# Patient Record
Sex: Male | Born: 1940 | Race: Black or African American | Hispanic: No | Marital: Married | Smoking: Former smoker
Health system: Southern US, Community
[De-identification: ages and names within clinical notes are randomized; demographics above are authoritative.]

## PROBLEM LIST (undated history)

## (undated) DIAGNOSIS — F810 Specific reading disorder: Secondary | ICD-10-CM

## (undated) DIAGNOSIS — J449 Chronic obstructive pulmonary disease, unspecified: Secondary | ICD-10-CM

## (undated) DIAGNOSIS — N400 Enlarged prostate without lower urinary tract symptoms: Secondary | ICD-10-CM

## (undated) DIAGNOSIS — N62 Hypertrophy of breast: Secondary | ICD-10-CM

## (undated) DIAGNOSIS — I10 Essential (primary) hypertension: Secondary | ICD-10-CM

## (undated) DIAGNOSIS — I639 Cerebral infarction, unspecified: Secondary | ICD-10-CM

## (undated) DIAGNOSIS — N529 Male erectile dysfunction, unspecified: Secondary | ICD-10-CM

## (undated) HISTORY — DX: Hypertrophy of breast: N62

## (undated) HISTORY — DX: Benign prostatic hyperplasia without lower urinary tract symptoms: N40.0

## (undated) HISTORY — DX: Male erectile dysfunction, unspecified: N52.9

## (undated) HISTORY — DX: Chronic obstructive pulmonary disease, unspecified: J44.9

## (undated) HISTORY — DX: Essential (primary) hypertension: I10

## (undated) HISTORY — PX: OTHER SURGICAL HISTORY: SHX169

## (undated) HISTORY — DX: Specific reading disorder: F81.0

---

## 2002-02-22 ENCOUNTER — Encounter: Admission: RE | Admit: 2002-02-22 | Discharge: 2002-02-22 | Payer: Self-pay | Admitting: Family Medicine

## 2002-02-22 ENCOUNTER — Encounter: Payer: Self-pay | Admitting: Family Medicine

## 2003-03-16 ENCOUNTER — Encounter: Admission: RE | Admit: 2003-03-16 | Discharge: 2003-03-16 | Payer: Self-pay | Admitting: Family Medicine

## 2003-03-16 ENCOUNTER — Encounter: Payer: Self-pay | Admitting: Family Medicine

## 2004-03-15 ENCOUNTER — Encounter: Admission: RE | Admit: 2004-03-15 | Discharge: 2004-03-15 | Payer: Self-pay | Admitting: Family Medicine

## 2005-04-08 ENCOUNTER — Ambulatory Visit: Payer: Self-pay | Admitting: Family Medicine

## 2005-04-08 ENCOUNTER — Encounter: Admission: RE | Admit: 2005-04-08 | Discharge: 2005-04-08 | Payer: Self-pay | Admitting: Family Medicine

## 2005-04-16 ENCOUNTER — Ambulatory Visit: Payer: Self-pay | Admitting: Gastroenterology

## 2005-04-24 ENCOUNTER — Ambulatory Visit: Payer: Self-pay | Admitting: Gastroenterology

## 2005-04-24 ENCOUNTER — Encounter (INDEPENDENT_AMBULATORY_CARE_PROVIDER_SITE_OTHER): Payer: Self-pay | Admitting: Specialist

## 2005-04-24 HISTORY — PX: COLONOSCOPY: SHX174

## 2006-05-08 ENCOUNTER — Encounter: Admission: RE | Admit: 2006-05-08 | Discharge: 2006-05-08 | Payer: Self-pay | Admitting: Family Medicine

## 2006-05-08 ENCOUNTER — Ambulatory Visit: Payer: Self-pay | Admitting: Family Medicine

## 2007-04-10 DIAGNOSIS — J449 Chronic obstructive pulmonary disease, unspecified: Secondary | ICD-10-CM

## 2007-04-10 DIAGNOSIS — I10 Essential (primary) hypertension: Secondary | ICD-10-CM | POA: Insufficient documentation

## 2007-04-10 DIAGNOSIS — R351 Nocturia: Secondary | ICD-10-CM

## 2007-04-10 DIAGNOSIS — N401 Enlarged prostate with lower urinary tract symptoms: Secondary | ICD-10-CM

## 2007-04-10 DIAGNOSIS — J4489 Other specified chronic obstructive pulmonary disease: Secondary | ICD-10-CM | POA: Insufficient documentation

## 2007-05-12 ENCOUNTER — Ambulatory Visit: Payer: Self-pay | Admitting: Family Medicine

## 2007-05-12 DIAGNOSIS — N529 Male erectile dysfunction, unspecified: Secondary | ICD-10-CM | POA: Insufficient documentation

## 2008-05-25 ENCOUNTER — Encounter: Payer: Self-pay | Admitting: Family Medicine

## 2008-05-26 ENCOUNTER — Ambulatory Visit: Payer: Self-pay | Admitting: Family Medicine

## 2008-05-26 LAB — CONVERTED CEMR LAB
Albumin: 3.8 g/dL (ref 3.5–5.2)
Basophils Absolute: 0.1 10*3/uL (ref 0.0–0.1)
Basophils Relative: 0.8 % (ref 0.0–3.0)
Bilirubin Urine: NEGATIVE
Blood in Urine, dipstick: NEGATIVE
Calcium: 9.7 mg/dL (ref 8.4–10.5)
Creatinine, Ser: 1.1 mg/dL (ref 0.4–1.5)
Direct LDL: 163.3 mg/dL
Eosinophils Absolute: 0.1 10*3/uL (ref 0.0–0.7)
GFR calc Af Amer: 86 mL/min
GFR calc non Af Amer: 71 mL/min
HCT: 40.1 % (ref 39.0–52.0)
HDL: 37.1 mg/dL — ABNORMAL LOW (ref >39.0)
Hemoglobin: 13.6 g/dL (ref 13.0–17.0)
Ketones, urine, test strip: NEGATIVE
MCHC: 33.9 g/dL (ref 30.0–36.0)
MCV: 85.5 fL (ref 78.0–100.0)
Monocytes Absolute: 0.5 10*3/uL (ref 0.1–1.0)
Neutro Abs: 4.4 10*3/uL (ref 1.4–7.7)
Neutrophils Relative %: 66 % (ref 43.0–77.0)
PSA: 1.13 ng/mL (ref 0.10–4.00)
RBC: 4.69 M/uL (ref 4.22–5.81)
Specific Gravity, Urine: 1.015
Total Bilirubin: 0.7 mg/dL (ref 0.3–1.2)
WBC Urine, dipstick: NEGATIVE
pH: 6.5

## 2008-06-02 ENCOUNTER — Ambulatory Visit: Payer: Self-pay | Admitting: Family Medicine

## 2008-08-23 ENCOUNTER — Telehealth: Payer: Self-pay | Admitting: Family Medicine

## 2009-05-26 ENCOUNTER — Encounter: Payer: Self-pay | Admitting: Family Medicine

## 2009-05-29 ENCOUNTER — Ambulatory Visit: Payer: Self-pay | Admitting: Family Medicine

## 2009-05-29 LAB — CONVERTED CEMR LAB
ALT: 14 units/L (ref 0–53)
Albumin: 3.8 g/dL (ref 3.5–5.2)
BUN: 12 mg/dL (ref 6–23)
Basophils Relative: 0.2 % (ref 0.0–3.0)
Bilirubin, Direct: 0 mg/dL (ref 0.0–0.3)
CO2: 31 meq/L (ref 19–32)
Chloride: 108 meq/L (ref 96–112)
Cholesterol: 166 mg/dL (ref 0–200)
Creatinine, Ser: 1 mg/dL (ref 0.4–1.5)
Eosinophils Absolute: 0.1 10*3/uL (ref 0.0–0.7)
Eosinophils Relative: 0.8 % (ref 0.0–5.0)
HCT: 41.7 % (ref 39.0–52.0)
Lymphs Abs: 1.5 10*3/uL (ref 0.7–4.0)
MCHC: 32.8 g/dL (ref 30.0–36.0)
MCV: 88.4 fL (ref 78.0–100.0)
Monocytes Absolute: 0.4 10*3/uL (ref 0.1–1.0)
Neutrophils Relative %: 69.2 % (ref 43.0–77.0)
PSA: 1.36 ng/mL (ref 0.10–4.00)
Potassium: 4.5 meq/L (ref 3.5–5.1)
RBC: 4.72 M/uL (ref 4.22–5.81)
TSH: 1.33 microintl units/mL (ref 0.35–5.50)
Total CHOL/HDL Ratio: 4
Total Protein: 7.4 g/dL (ref 6.0–8.3)
Triglycerides: 71 mg/dL (ref 0.0–149.0)

## 2010-03-29 ENCOUNTER — Encounter: Payer: Self-pay | Admitting: Gastroenterology

## 2010-04-02 ENCOUNTER — Encounter (INDEPENDENT_AMBULATORY_CARE_PROVIDER_SITE_OTHER): Payer: Self-pay | Admitting: *Deleted

## 2010-05-14 ENCOUNTER — Ambulatory Visit: Payer: Self-pay | Admitting: Gastroenterology

## 2010-05-22 ENCOUNTER — Ambulatory Visit: Payer: Self-pay | Admitting: Gastroenterology

## 2010-05-22 LAB — HM COLONOSCOPY

## 2010-05-24 ENCOUNTER — Encounter: Payer: Self-pay | Admitting: Gastroenterology

## 2010-06-01 ENCOUNTER — Encounter: Payer: Self-pay | Admitting: Family Medicine

## 2010-06-01 ENCOUNTER — Ambulatory Visit: Payer: Self-pay | Admitting: Family Medicine

## 2010-06-01 LAB — CONVERTED CEMR LAB
Blood in Urine, dipstick: NEGATIVE
Glucose, Urine, Semiquant: NEGATIVE
Nitrite: NEGATIVE
Specific Gravity, Urine: 1.015
WBC Urine, dipstick: NEGATIVE
pH: 7

## 2010-06-04 LAB — CONVERTED CEMR LAB
AST: 19 units/L (ref 0–37)
Albumin: 4 g/dL (ref 3.5–5.2)
Alkaline Phosphatase: 63 units/L (ref 39–117)
Basophils Relative: 0.4 % (ref 0.0–3.0)
CO2: 29 meq/L (ref 19–32)
Calcium: 9.7 mg/dL (ref 8.4–10.5)
Chloride: 105 meq/L (ref 96–112)
Eosinophils Absolute: 0.1 10*3/uL (ref 0.0–0.7)
Glucose, Bld: 93 mg/dL (ref 70–99)
HCT: 41.7 % (ref 39.0–52.0)
HDL: 49.5 mg/dL (ref >39.00)
Hemoglobin: 13.7 g/dL (ref 13.0–17.0)
Lymphocytes Relative: 22.3 % (ref 12.0–46.0)
Lymphs Abs: 1.7 10*3/uL (ref 0.7–4.0)
MCHC: 33 g/dL (ref 30.0–36.0)
Monocytes Relative: 6.6 % (ref 3.0–12.0)
Neutro Abs: 5.5 10*3/uL (ref 1.4–7.7)
Potassium: 5.7 meq/L — ABNORMAL HIGH (ref 3.5–5.1)
RBC: 4.79 M/uL (ref 4.22–5.81)
RDW: 14.3 % (ref 11.5–14.6)
Sodium: 141 meq/L (ref 135–145)
TSH: 0.85 microintl units/mL (ref 0.35–5.50)
Total CHOL/HDL Ratio: 3
Total Protein: 7.3 g/dL (ref 6.0–8.3)

## 2010-09-30 LAB — CONVERTED CEMR LAB
AST: 20 units/L (ref 0–37)
Albumin: 3.6 g/dL (ref 3.5–5.2)
Basophils Absolute: 0.1 10*3/uL (ref 0.0–0.1)
Basophils Relative: 0.8 % (ref 0.0–1.0)
CO2: 28 meq/L (ref 19–32)
Chloride: 106 meq/L (ref 96–112)
Creatinine, Ser: 1.1 mg/dL (ref 0.4–1.5)
Direct LDL: 148.6 mg/dL
Eosinophils Relative: 0.6 % (ref 0.0–5.0)
HCT: 40.9 % (ref 39.0–52.0)
Hemoglobin: 13.9 g/dL (ref 13.0–17.0)
Monocytes Absolute: 0.5 10*3/uL (ref 0.2–0.7)
Neutrophils Relative %: 66.7 % (ref 43.0–77.0)
PSA: 1.33 ng/mL (ref 0.10–4.00)
Potassium: 4.5 meq/L (ref 3.5–5.1)
RBC: 4.84 M/uL (ref 4.22–5.81)
RDW: 13.3 % (ref 11.5–14.6)
Sodium: 140 meq/L (ref 135–145)
Total Bilirubin: 0.8 mg/dL (ref 0.3–1.2)
Total CHOL/HDL Ratio: 5.6
Total Protein: 6.9 g/dL (ref 6.0–8.3)
Triglycerides: 118 mg/dL (ref 0–149)
VLDL: 24 mg/dL (ref 0–40)
WBC: 7.1 10*3/uL (ref 4.5–10.5)

## 2010-10-04 NOTE — Procedures (Signed)
Summary: Colonoscopy  Patient: Eric [name] Note: All result statuses are Final unless otherwise noted.  Tests: (1) Colonoscopy (COL)   COL Colonoscopy           DONE     [facility]     [address]     [place], [place]  [address]           COLONOSCOPY PROCEDURE REPORT           PATIENT:  [name], [name]  MR#:  [id number]     BIRTHDATE:  [birth year], 69 yrs. old  GENDER:  male     ENDOSCOPIST:  Rachael Fee, MD     PROCEDURE DATE:  05/22/2010     PROCEDURE:  Colonoscopy with biopsy     ASA CLASS:  Class II     INDICATIONS:  history of pre-cancerous (adenomatous) colon polyps,     subcentimeter TA removed in 2006     MEDICATIONS:   Fentanyl 50 mcg IV, Versed 5 mg IV           DESCRIPTION OF PROCEDURE:   After the risks benefits and     alternatives of the procedure were thoroughly explained, informed     consent was obtained.  Digital rectal exam was performed and     revealed no rectal masses.   The LB [id number]-[id number] endoscope     was introduced through the anus and advanced to the cecum, which     was identified by both the appendix and ileocecal valve, without     limitations.  The quality of the prep was adequate, using     MoviPrep.  The instrument was then slowly withdrawn as the colon     was fully examined.     <<PROCEDUREIMAGES>>     FINDINGS:  There were two 1-83mm sessile polyps in descending     colon. Both were removed with forceps and sent to pathology (jar     1) (see image4).  Mild diverticulosis was found throughout the     colon.  This was otherwise a normal examination of the colon (see     image1, image2, and image5).   Retroflexed views in the rectum     revealed no abnormalities.    The scope was then withdrawn from     the patient and the procedure completed.     COMPLICATIONS:  None           ENDOSCOPIC IMPRESSION:     1) Two small polyps, both removed and sent to pathology     2) Mild diverticulosis throughout the colon     3)  Otherwise normal examination           RECOMMENDATIONS:     1) Given your personal history of adenomatous (pre-cancerous)     polyps, you will need a repeat colonoscopy in 5 years even if the     polyps removed today are not pre-cancerous.     2) You will receive a letter within 1-2 weeks with the results     of your biopsy as well as final recommendations. Please call my     office if you have not received a letter after 3 weeks.           ______________________________     Rachael Fee, MD           n.     eSIGNED:   Rachael Fee at 05/22/2010 11:13 AM  Sherri, [place], [phone]  Note: An exclamation mark (!) indicates a result that was not dispersed into the flowsheet. Document Creation Date: 05/22/2010 11:14 AM _______________________________________________________________________  (1) Order result status: Final Collection or observation date-time: 05/22/2010 11:08 Requested date-time:  Receipt date-time:  Reported date-time:  Referring Physician:   Ordering Physician: Rob Bunting [phone]) Specimen Source:  Source: Launa Grill Order Number: [id number] Lab site:   Appended Document: Colonoscopy     Procedures Next Due Date:    Colonoscopy: 05/2015

## 2010-10-04 NOTE — Letter (Signed)
Summary: Results Letter  [facility]  [address] [place], [place] [address]   Phone: [phone]  Fax: [phone]        May 24, 2010 MRN: [id number]    [facility] [address] [place], [place]  [address]    Dear Mr. GENEST,   At least one of the polyps removed during your recent procedure was proven to be adenomatous.  These are pre-cancerous polyps that may have grown into cancers if they had not been removed.  Based on current nationally recognized surveillance guidelines, I recommend that you have a repeat colonoscopy in 5 years.  We will therefore put your information in our reminder system and will contact you in 5 years to schedule a repeat procedure.  Please call if you have any questions or concerns.       Sincerely,  Rachael Fee MD  This letter has been electronically signed by your physician.  Appended Document: Results Letter letter mailed

## 2010-10-04 NOTE — Miscellaneous (Signed)
Summary: previsit prep/RM  Clinical Lists Changes  Medications: Added new medication of HALFLYTELY WITH FLAVOR PACKS 5-210 MG-GM KIT (BISACODYL-PEG-KCL-NABICAR-NACL) take as directed for colon prep - Signed Rx of HALFLYTELY WITH FLAVOR PACKS 5-210 MG-GM KIT (BISACODYL-PEG-KCL-NABICAR-NACL) take as directed for colon prep;  #1 x 0;  Signed;  Entered by: Sherren Kerns RN;  Authorized by: Rachael Fee MD;  Method used: [facility]  [address] [id number] [address] [place], [place]  [address], Ph: [phone] or [phone], Fax: [phone] Observations: Added new observation of ALLERGY REV: Done (05/14/2010 10:16)    Prescriptions: HALFLYTELY WITH FLAVOR PACKS 5-210 MG-GM KIT (BISACODYL-PEG-KCL-NABICAR-NACL) take as directed for colon prep  #1 x 0   Entered by:   Sherren Kerns RN   Authorized by:   Rachael Fee MD   Signed by:   Sherren Kerns RN on 05/14/2010   Method used:   [facility]  [address] [id number]       [address]       [place], [place]  [address]       Ph: [phone] or [phone]       Fax: [phone]   RxID:   [id number]   Appended Document: previsit prep/RM Patient request halflytely prep due to cost,he took this last time. Called pharmacy he uses but they could not give me price of movi prep until patient fills RX. Gave halflytely prep this time also.Sherren Kerns RN  May 14, 2010 10:57 AM'    Clinical Lists Changes

## 2010-10-04 NOTE — Assessment & Plan Note (Signed)
Summary: pt will come in fasting/njr   Vital Signs:  Patient profile:   70 year old male Height:      68.25 inches Weight:      164 pounds BMI:     24.84 Temp:     97.6 degrees F oral BP sitting:   130 / 90  (left arm) Cuff size:   regular  Vitals Entered By: Kern Reap CMA Duncan Dull) (June 01, 2010 10:32 AM) CC: annual wellness exam Is Patient Diabetic? No Pain Assessment Patient in pain? no        CC:  annual wellness exam.  History of Present Illness: Eric [name] is a delightful, 70 year old, married male, nonsmoker, x 4 years, who comes in today for Medicare wellness exam.  He has underlying hypertension, in which he treats with Accupril 20 mg daily BP 130/90.  Also one aspirin tablet daily.  Review of systems negative except for erectile dysfunction, but does not respond to oral medications.  Tetanus 2009, Pneumovax 2007, seasonal flu shot today.  Routine eye care, dental care, colonoscopy recently, showed a polyp 5 year recall Here for Medicare AWV:  1.   Risk factors based on Past M, S, F history:.....reviewed in detail the changes 2.   Physical Activities: walks daily 3.   Depression/mood: good mood.  No depression 4.   Hearing: hearing normal 5.   ADL's: functions independently manages his own finances 6.   Fall Risk: reviewed.  No risk factors  identified 7.   Home Safety:......Marland Kitchenreviewed no guns in the house 8.   Height, weight, &visual acuity:height weight, normal annual eye exam 9.   Counseling: continue exercise and medication 10.   Labs ordered based on risk factors: labs done today 11.           Referral Coordination.........none indicated 12.           Care Plan.........Marland Kitchenreviewed medication, diet, exercise, and living will 13.            Cognitive Assessment ........Marland Kitchenoriented x 3 ...... does his own financial work  Allergies (verified): No Known Drug Allergies  Past History:  Past medical, surgical, family and social histories (including risk factors)  reviewed, and no changes noted (except as noted below).  Past Medical History: Reviewed history from 05/26/2008 and no changes required. COPD Hypertension Benign prostatic hypertrophy ED gynecomastia bilateral breast removal  Past Surgical History: Reviewed history from 04/10/2007 and no changes required. Bilat. Breast removal Colonoscopy-04/24/2005  Family History: Reviewed history from 04/10/2007 and no changes required. Family History Diabetes 1st degree relative Family History Hypertension Fam hx PVD  Social History: Reviewed history from 05/26/2008 and no changes required. Former Smoker Alcohol use-yes Regular exercise-yes Retired  Review of Systems      See HPI       Flu Vaccine Consent Questions     Do you have a history of severe allergic reactions to this vaccine? no    Any prior history of allergic reactions to egg and/or gelatin? no    Do you have a sensitivity to the preservative Thimersol? no    Do you have a past history of Guillan-Barre Syndrome? no    Do you currently have an acute febrile illness? no    Have you ever had a severe reaction to latex? no    Vaccine information given and explained to patient? yes    Are you currently pregnant? no    Lot Number:[id number]   Exp Date:03/02/2011   Site Given  Left Deltoid  IM   Physical Exam  General:  Well-developed,well-nourished,in no acute distress; alert,appropriate and cooperative throughout examination Head:  Normocephalic and atraumatic without obvious abnormalities. No apparent alopecia or balding. Eyes:  No corneal or conjunctival inflammation noted. EOMI. Perrla. Funduscopic exam benign, without hemorrhages, exudates or papilledema. Vision grossly normal. Ears:  External ear exam shows no significant lesions or deformities.  Otoscopic examination reveals clear canals, tympanic membranes are intact bilaterally without bulging, retraction, inflammation or discharge. Hearing is grossly normal  bilaterally. Nose:  External nasal examination shows no deformity or inflammation. Nasal mucosa are pink and moist without lesions or exudates. Mouth:  Oral mucosa and oropharynx without lesions or exudates.  Teeth in good repair. Neck:  No deformities, masses, or tenderness noted. Chest Wall:  No deformities, masses, tenderness or gynecomastia noted. Breasts:  No masses or gynecomastia noted Lungs:  Normal respiratory effort, chest expands symmetrically. Lungs are clear to auscultation, no crackles or wheezes. Heart:  Normal rate and regular rhythm. S1 and S2 normal without gallop, murmur, click, rub or other extra sounds. Abdomen:  Bowel sounds positive,abdomen soft and non-tender without masses, organomegaly or hernias noted. Rectal:  No external abnormalities noted. Normal sphincter tone. No rectal masses or tenderness. Genitalia:  Testes bilaterally descended without nodularity, tenderness or masses. No scrotal masses or lesions. No penis lesions or urethral discharge. Prostate:  Prostate gland firm and smooth, no enlargement, nodularity, tenderness, mass, asymmetry or induration. Msk:  No deformity or scoliosis noted of thoracic or lumbar spine.   Pulses:  R and L carotid,radial,femoral,dorsalis pedis and posterior tibial pulses are full and equal bilaterally Extremities:  No clubbing, cyanosis, edema, or deformity noted with normal full range of motion of all joints.   Neurologic:  No cranial nerve deficits noted. Station and gait are normal. Plantar reflexes are down-going bilaterally. DTRs are symmetrical throughout. Sensory, motor and coordinative functions appear intact. Skin:  Intact without suspicious lesions or rashes.........scars on right and left chest wall from previous surgery Cervical Nodes:  No lymphadenopathy noted Axillary Nodes:  No palpable lymphadenopathy Inguinal Nodes:  No significant adenopathy Psych:  Cognition and judgment appear intact. Alert and cooperative with  normal attention span and concentration. No apparent delusions, illusions, hallucinations   Impression & Recommendations:  Problem # 1:  ERECTILE DYSFUNCTION, ORGANIC (ICD-607.84) Assessment Deteriorated  The following medications were removed from the medication list:    Levitra 10 Mg Tabs (Vardenafil hcl) ..... Uad  Problem # 2:  HYPERTENSION (ICD-401.9) Assessment: Improved  His updated medication list for this problem includes:    Accupril 20 Mg Tabs (Quinapril hcl) .Marland Kitchen... Take 1 tablet by mouth every morning  Orders: Venipuncture (43154) TLB-Lipid Panel (80061-LIPID) TLB-BMP (Basic Metabolic Panel-BMET) (80048-METABOL) TLB-CBC Platelet - w/Differential (85025-CBCD) TLB-Hepatic/Liver Function Pnl (80076-HEPATIC) TLB-TSH (Thyroid Stimulating Hormone) (84443-TSH) TLB-PSA (Prostate Specific Antigen) (84153-PSA) Medicare -1st Annual Wellness Visit [phone]) Urinalysis-dipstick only (Medicare patient) (61950DT) Specimen Handling (26712) EKG w/ Interpretation (93000)  Problem # 3:  Preventive Health Care (ICD-V70.0) Assessment: Unchanged  Complete Medication List: 1)  Accupril 20 Mg Tabs (Quinapril hcl) .... Take 1 tablet by mouth every morning  Other Orders: Flu Vaccine 51yrs + MEDICARE PATIENTS (W5809) Administration Flu vaccine - MCR (X8338)  Patient Instructions: 1)  Please schedule a follow-up appointment in 1 year. 2)  It is important that you exercise regularly at least 20 minutes 5 times a week. If you develop chest pain, have severe difficulty breathing, or feel very tired , stop exercising immediately and seek medical attention.  3)  Schedule a colonoscopy/sigmoidoscopy to help detect colon cancer. 4)  Take an Aspirin every day. 5)  Choose your Health care Power of Attorney and/or prepare a Living Will. Prescriptions: ACCUPRIL 20 MG TABS (QUINAPRIL HCL) Take 1 tablet by mouth every morning  #100 Tablet x 3   Entered and Authorized by:   Roderick Pee MD   Signed  by:   Roderick Pee MD on 06/01/2010   Method used:   [facility]  [address] [id number]       [address]       [place], [place]  [address]       Ph: [phone] or [phone]       Fax: [phone]   RxID:   [id number]     Laboratory Results   Urine Tests  Date/Time Recieved: June 01, 2010 12:49 PM  Date/Time Reported: June 01, 2010 12:49 PM   Routine Urinalysis   Color: yellow Appearance: Clear Glucose: negative   (Normal Range: Negative) Bilirubin: negative   (Normal Range: Negative) Ketone: negative   (Normal Range: Negative) Spec. Gravity: 1.015   (Normal Range: 1.003-1.035) Blood: negative   (Normal Range: Negative) pH: 7.0   (Normal Range: 5.0-8.0) Protein: negative   (Normal Range: Negative) Urobilinogen: 0.2   (Normal Range: 0-1) Nitrite: negative   (Normal Range: Negative) Leukocyte Esterace: negative   (Normal Range: Negative)    Comments: Wynona Canes, CMA  June 01, 2010 12:49 PM

## 2010-10-04 NOTE — Letter (Signed)
Summary: Colonoscopy Letter  [facility]  [address] [place], [place] [address]   Phone: [phone]  Fax: [phone]      March 29, 2010 MRN: [id number]   [facility] [address] [place], [place]  [address]   Dear Mr. BEECK,   According to your medical record, it is time for you to schedule a Colonoscopy. The American Cancer Society recommends this procedure as a method to detect early colon cancer. Patients with a family history of colon cancer, or a personal history of colon polyps or inflammatory bowel disease are at increased risk.  This letter has been generated based on the recommendations made at the time of your procedure. If you feel that in your particular situation this may no longer apply, please contact our office.  Please call our office at [phone] to schedule this appointment or to update your records at your earliest convenience.  Thank you for cooperating with [place] to provide you with the very best care possible.   Sincerely,  Rachael Fee, M.D.  [facility] Gastroenterology Division [phone]

## 2010-10-04 NOTE — Letter (Signed)
Summary: Previsit letter  [facility]  [address] [place], [place] [address]   Phone: [phone]  Fax: [phone]       04/02/2010 MRN: [id number]  [facility] [address] [place], [place]  [address]  Dear Eric [name],  Welcome to the Gastroenterology Division at [facility].    You are scheduled to see a nurse for your pre-procedure visit on 05/14/2010 at 10:30AM on the 3rd floor at [facility], [address]  We ask that you try to arrive at our office 15 minutes prior to your appointment time to allow for check-in.  Your nurse visit will consist of discussing your medical and surgical history, your immediate family medical history, and your medications.    Please bring a complete list of all your medications or, if you prefer, bring the medication bottles and we will list them.  We will need to be aware of both prescribed and over the counter drugs.  We will need to know exact dosage information as well.  If you are on blood thinners (Coumadin, Plavix, Aggrenox, Ticlid, etc.) please call our office today/prior to your appointment, as we need to consult with your physician about holding your medication.   Please be prepared to read and sign documents such as consent forms, a financial agreement, and acknowledgement forms.  If necessary, and with your consent, a friend or relative is welcome to sit-in on the nurse visit with you.  Please bring your insurance card so that we may make a copy of it.  If your insurance requires a referral to see a specialist, please bring your referral form from your primary care physician.  No co-pay is required for this nurse visit.     If you cannot keep your appointment, please call [phone] to cancel or reschedule prior to your appointment date.  This allows [place] the opportunity to schedule an appointment for another patient in need of care.    Thank you for choosing [facility] for your medical  needs.  We appreciate the opportunity to care for you.  Please visit [place] at our website  to learn more about our practice.                     Sincerely.                                                                                                                   The Gastroenterology Division

## 2011-06-03 ENCOUNTER — Ambulatory Visit (INDEPENDENT_AMBULATORY_CARE_PROVIDER_SITE_OTHER): Payer: Self-pay | Admitting: Family Medicine

## 2011-06-03 ENCOUNTER — Encounter: Payer: Self-pay | Admitting: Family Medicine

## 2011-06-03 VITALS — BP 170/90 | Temp 97.5°F | Ht 67.75 in | Wt 164.0 lb

## 2011-06-03 DIAGNOSIS — Z0000 Encounter for general adult medical examination without abnormal findings: Secondary | ICD-10-CM

## 2011-06-03 DIAGNOSIS — I10 Essential (primary) hypertension: Secondary | ICD-10-CM

## 2011-06-03 DIAGNOSIS — Z23 Encounter for immunization: Secondary | ICD-10-CM

## 2011-06-03 DIAGNOSIS — J449 Chronic obstructive pulmonary disease, unspecified: Secondary | ICD-10-CM

## 2011-06-03 DIAGNOSIS — N400 Enlarged prostate without lower urinary tract symptoms: Secondary | ICD-10-CM

## 2011-06-03 LAB — HEPATIC FUNCTION PANEL
ALT: 16 U/L (ref 0–53)
AST: 23 U/L (ref 0–37)
Alkaline Phosphatase: 56 U/L (ref 39–117)
Bilirubin, Direct: 0.1 mg/dL (ref 0.0–0.3)
Total Protein: 7.7 g/dL (ref 6.0–8.3)

## 2011-06-03 LAB — POCT URINALYSIS DIPSTICK
Blood, UA: NEGATIVE
Nitrite, UA: NEGATIVE
Protein, UA: NEGATIVE
Spec Grav, UA: 1.015
Urobilinogen, UA: 0.2
pH, UA: 8.5

## 2011-06-03 LAB — BASIC METABOLIC PANEL
Chloride: 106 mEq/L (ref 96–112)
Creatinine, Ser: 0.9 mg/dL (ref 0.4–1.5)

## 2011-06-03 LAB — CBC WITH DIFFERENTIAL/PLATELET
Basophils Relative: 0.4 % (ref 0.0–3.0)
Eosinophils Relative: 1.2 % (ref 0.0–5.0)
Lymphocytes Relative: 21.6 % (ref 12.0–46.0)
MCV: 78 fl (ref 78.0–100.0)
Monocytes Relative: 6.2 % (ref 3.0–12.0)
Neutrophils Relative %: 70.6 % (ref 43.0–77.0)
RBC: 4.95 Mil/uL (ref 4.22–5.81)
WBC: 7.3 10*3/uL (ref 4.5–10.5)

## 2011-06-03 LAB — TSH: TSH: 0.95 u[IU]/mL (ref 0.35–5.50)

## 2011-06-03 LAB — PSA: PSA: 1.66 ng/mL (ref 0.10–4.00)

## 2011-06-03 LAB — LIPID PANEL: Total CHOL/HDL Ratio: 3

## 2011-06-03 MED ORDER — QUINAPRIL HCL 20 MG PO TABS: 20.0000 mg | ORAL_TABLET | Freq: Every day | ORAL | Status: DC

## 2011-06-03 NOTE — Progress Notes (Signed)
Subjective:    Patient ID: Eric [name], male    DOB: 1941-08-27, 70 y.o.   MRN: [id number]  HPI Wil;lie Is a 70 year old male, who comes in today for a Medicare wellness exam.  Because of an underlying history of hypertension.  He is always been in good health.  He no longer smokes.  He has underlying COPD.  He takes Accupril 20 mg daily BP today 170/90.  He, states he's not taking his medication on a daily basis.  Advised to take a daily to her blood pressure checked at home daily for 3 weeks.  Return in 3 weeks for follow-up.  He gets routine eye care, hearing normal, regular dental care, activities of daily living, normal, although safety reviewed.  No lesions identified.  Cognitive function, normal, no guns in the house, he does have a healthcare power of attorney and living will.  He walks on a daily basis.  Tetanus booster 2009 Pneumovax 2007 seasonal flu shot today.   Review of Systems  Constitutional: Negative.   HENT: Negative.   Eyes: Negative.   Respiratory: Negative.   Cardiovascular: Negative.   Gastrointestinal: Negative.   Genitourinary: Negative.   Musculoskeletal: Negative.   Skin: Negative.   Neurological: Negative.   Hematological: Negative.   Psychiatric/Behavioral: Negative.        Objective:   Physical Exam  Constitutional: He is oriented to person, place, and time. He appears well-developed and well-nourished.  HENT:  Head: Normocephalic and atraumatic.  Right Ear: External ear normal.  Left Ear: External ear normal.  Nose: Nose normal.  Mouth/Throat: Oropharynx is clear and moist.  Eyes: Conjunctivae and EOM are normal. Pupils are equal, round, and reactive to light.  Neck: Normal range of motion. Neck supple. No JVD present. No tracheal deviation present. No thyromegaly present.  Cardiovascular: Normal rate, regular rhythm, normal heart sounds and intact distal pulses.  Exam reveals no gallop and no friction rub.   No murmur  heard. Pulmonary/Chest: Effort normal and breath sounds normal. No stridor. No respiratory distress. He has no wheezes. He has no rales. He exhibits no tenderness.  Abdominal: Soft. Bowel sounds are normal. He exhibits no distension and no mass. There is no tenderness. There is no rebound and no guarding.  Genitourinary: Rectum normal, prostate normal and penis normal. Guaiac negative stool. No penile tenderness.  Musculoskeletal: Normal range of motion. He exhibits no edema and no tenderness.  Lymphadenopathy:    He has no cervical adenopathy.  Neurological: He is alert and oriented to person, place, and time. He has normal reflexes. No cranial nerve deficit. He exhibits normal muscle tone.  Skin: Skin is warm and dry. No rash noted. No erythema. No pallor.  Psychiatric: He has a normal mood and affect. His behavior is normal. Judgment and thought content normal.          Assessment & Plan:  Healthy male.  Hypertension, not at goal.  Recommend daily medication.  BP check.  Daily follow-up in two weeks

## 2011-06-03 NOTE — Patient Instructions (Signed)
Take your blood pressure medication, one tab daily in the morning.  Purchase.  A pump up digital blood pressure cuff and check your blood pressure daily in the morning.  Return in two weeks with the data and the device

## 2011-06-17 ENCOUNTER — Ambulatory Visit (INDEPENDENT_AMBULATORY_CARE_PROVIDER_SITE_OTHER): Payer: Medicare Other | Admitting: Family Medicine

## 2011-06-17 ENCOUNTER — Encounter: Payer: Self-pay | Admitting: Family Medicine

## 2011-06-17 DIAGNOSIS — M25519 Pain in unspecified shoulder: Secondary | ICD-10-CM

## 2011-06-17 DIAGNOSIS — I10 Essential (primary) hypertension: Secondary | ICD-10-CM

## 2011-06-17 NOTE — Progress Notes (Signed)
Subjective:    Patient ID: Eric [name], male    DOB: 1941/01/19, 70 y.o.   MRN: [id number]  HPIWill is a 70 year old male, who comes in today for follow-up of hypertension.  He had stopped his blood pressure medication.  BP gone up to 180/120.  We started him on Accupril 20 mg daily BP now 142/88.  No side effect from medication.  He is also complaining of bilateral shoulder pain.  He states the Motrin, 600 mg b.i.d. With no improvement in his pain.  He does not recall any specific trauma.    Review of Systems General cardiovascular orthopedic review of systems otherwise negative    Objective:   Physical Exam  Well-developed well-nourished, male in no acute distress.  BP right arm sitting position 142/88      Assessment & Plan:  Hypertension under improved control continue Accupril 20 daily BP check.  Q.a.m. Follow-up in 3 months.  Bilateral shoulder pain, etiology unknown.  Refer to Dr. Cleophas Dunker for consult

## 2011-06-17 NOTE — Patient Instructions (Signed)
Continue the Accupril, one tablet daily in the morning and continue checking her blood pressure daily.  Follow-up in 3 months.  Call Dr. Norlene Campbell orthopedist for consult about your arm pain

## 2011-09-17 ENCOUNTER — Ambulatory Visit (INDEPENDENT_AMBULATORY_CARE_PROVIDER_SITE_OTHER): Payer: Medicare Other | Admitting: Family Medicine

## 2011-09-17 ENCOUNTER — Encounter: Payer: Self-pay | Admitting: Family Medicine

## 2011-09-17 VITALS — BP 120/80 | Temp 98.1°F | Wt 170.0 lb

## 2011-09-17 DIAGNOSIS — I10 Essential (primary) hypertension: Secondary | ICD-10-CM

## 2011-09-17 MED ORDER — QUINAPRIL HCL 20 MG PO TABS: 20.0000 mg | ORAL_TABLET | ORAL | Status: DC

## 2011-09-17 NOTE — Progress Notes (Signed)
Subjective:    Patient ID: Eric [name], male    DOB: Aug 24, 1941, 71 y.o.   MRN: [id number]  HPI Eric [name] is a 71 year old male with underlying hypertension, who comes in today for follow-up.  We previously had seen him in his blood pressure was elevated.  However, he was consuming a lot of salt and not taking his medication on a daily basis.  He went on a salt free diet begin to take his 20 mg of Accupril daily, and now blood pressure is 120/80.   Review of Systems General and cardiovascular review of systems otherwise negative    Objective:   Physical Exam  Well-developed well-nourished, male in no acute distress.  BP right arm sitting position 120/80.  Pulse 70 and regular      Assessment & Plan:  Hypertension at goal continue therapy.  Follow up in the fall of 2013 per annual Medicare wellness exam

## 2011-09-17 NOTE — Patient Instructions (Signed)
Continue to take your medication daily, and avoid salt.  Follow-up in the fall of 2013 for your annual Medicare wellness exam

## 2012-06-04 ENCOUNTER — Ambulatory Visit (INDEPENDENT_AMBULATORY_CARE_PROVIDER_SITE_OTHER): Payer: Medicare Other | Admitting: Family Medicine

## 2012-06-04 ENCOUNTER — Encounter: Payer: Self-pay | Admitting: Family Medicine

## 2012-06-04 VITALS — BP 140/90 | Temp 98.2°F | Ht 69.5 in | Wt 169.0 lb

## 2012-06-04 DIAGNOSIS — N400 Enlarged prostate without lower urinary tract symptoms: Secondary | ICD-10-CM

## 2012-06-04 DIAGNOSIS — I10 Essential (primary) hypertension: Secondary | ICD-10-CM

## 2012-06-04 DIAGNOSIS — J4489 Other specified chronic obstructive pulmonary disease: Secondary | ICD-10-CM

## 2012-06-04 DIAGNOSIS — Z0000 Encounter for general adult medical examination without abnormal findings: Secondary | ICD-10-CM

## 2012-06-04 DIAGNOSIS — Z23 Encounter for immunization: Secondary | ICD-10-CM

## 2012-06-04 DIAGNOSIS — J449 Chronic obstructive pulmonary disease, unspecified: Secondary | ICD-10-CM

## 2012-06-04 DIAGNOSIS — N529 Male erectile dysfunction, unspecified: Secondary | ICD-10-CM

## 2012-06-04 LAB — POCT URINALYSIS DIPSTICK
Glucose, UA: NEGATIVE
Leukocytes, UA: NEGATIVE
Nitrite, UA: NEGATIVE
Protein, UA: NEGATIVE
Urobilinogen, UA: 0.2

## 2012-06-04 LAB — CBC WITH DIFFERENTIAL/PLATELET
Eosinophils Absolute: 0.1 10*3/uL (ref 0.0–0.7)
Eosinophils Relative: 0.6 % (ref 0.0–5.0)
HCT: 38.2 % — ABNORMAL LOW (ref 39.0–52.0)
Lymphs Abs: 1.5 10*3/uL (ref 0.7–4.0)
MCHC: 31.8 g/dL (ref 30.0–36.0)
MCV: 79.9 fl (ref 78.0–100.0)
Monocytes Absolute: 0.6 10*3/uL (ref 0.1–1.0)
Neutrophils Relative %: 75.3 % (ref 43.0–77.0)
Platelets: 337 10*3/uL (ref 150.0–400.0)
WBC: 8.5 10*3/uL (ref 4.5–10.5)

## 2012-06-04 LAB — TSH: TSH: 1.06 u[IU]/mL (ref 0.35–5.50)

## 2012-06-04 LAB — BASIC METABOLIC PANEL
Calcium: 9.4 mg/dL (ref 8.4–10.5)
Creatinine, Ser: 1.1 mg/dL (ref 0.4–1.5)
GFR: 88.44 mL/min (ref >60.00)
Sodium: 139 mEq/L (ref 135–145)

## 2012-06-04 MED ORDER — QUINAPRIL HCL 20 MG PO TABS: 20.0000 mg | ORAL_TABLET | ORAL | Status: DC

## 2012-06-04 NOTE — Progress Notes (Signed)
Subjective:    Patient ID: Eric [name], male    DOB: February 26, 1941, 71 y.o.   MRN: [id number]  HPI Mikolaj is a 71 year old X. smoker with underlying COPD and hypertension who comes in today for a Medicare wellness examination  His medications are unchanged he only takes an aspirin and 20 mg of Accupril daily for hypertension BP 140/90. Tetanus 2009 Pneumovax x2 seasonal flu shot today information given on shingles  He gets routine eye care, dental care, colonoscopy,  Cognitive function normal, he walks on a daily basis, home health safety reviewed no issues identified, no guns in the house, he does have a health care power of attorney and living will is   Review of Systems  Constitutional: Negative.   HENT: Negative.   Eyes: Negative.   Respiratory: Negative.   Cardiovascular: Negative.   Gastrointestinal: Negative.   Genitourinary: Negative.   Musculoskeletal: Negative.   Skin: Negative.   Neurological: Negative.   Hematological: Negative.   Psychiatric/Behavioral: Negative.        Objective:   Physical Exam  Constitutional: He is oriented to person, place, and time. He appears well-developed and well-nourished.  HENT:  Head: Normocephalic and atraumatic.  Right Ear: External ear normal.  Left Ear: External ear normal.  Nose: Nose normal.  Mouth/Throat: Oropharynx is clear and moist.       Poor dentition many missing teeth  Eyes: Conjunctivae normal and EOM are normal. Pupils are equal, round, and reactive to light.  Neck: Normal range of motion. Neck supple. No JVD present. No tracheal deviation present. No thyromegaly present.  Cardiovascular: Normal rate, regular rhythm, normal heart sounds and intact distal pulses.  Exam reveals no gallop and no friction rub.   No murmur heard. Pulmonary/Chest: Effort normal and breath sounds normal. No stridor. No respiratory distress. He has no wheezes. He has no rales. He exhibits no tenderness.  Abdominal: Soft. Bowel sounds  are normal. He exhibits no distension and no mass. There is no tenderness. There is no rebound and no guarding.  Genitourinary: Rectum normal. Guaiac negative stool. No penile tenderness.  Musculoskeletal: Normal range of motion. He exhibits no edema and no tenderness.  Lymphadenopathy:    He has no cervical adenopathy.  Neurological: He is alert and oriented to person, place, and time. He has normal reflexes. No cranial nerve deficit. He exhibits normal muscle tone.  Skin: Skin is warm and dry. No rash noted. No erythema. No pallor.  Psychiatric: He has a normal mood and affect. His behavior is normal. Judgment and thought content normal.   decreased breath sounds consistent with COPD          Assessment & Plan:  Healthy male  Hypertension continue current meds  Mild BPH with nocturia  History of COPD currently asymptomatic

## 2012-06-04 NOTE — Patient Instructions (Signed)
Continue your current medications  Followup in 1 year sooner if any problems 

## 2012-08-18 ENCOUNTER — Other Ambulatory Visit: Payer: Self-pay | Admitting: Family Medicine

## 2013-06-21 ENCOUNTER — Encounter: Payer: Self-pay | Admitting: Family Medicine

## 2013-06-21 ENCOUNTER — Ambulatory Visit (INDEPENDENT_AMBULATORY_CARE_PROVIDER_SITE_OTHER): Payer: Medicare Other | Admitting: Family Medicine

## 2013-06-21 VITALS — BP 184/100 | Temp 98.1°F | Ht 68.0 in | Wt 160.0 lb

## 2013-06-21 DIAGNOSIS — Z23 Encounter for immunization: Secondary | ICD-10-CM

## 2013-06-21 DIAGNOSIS — I10 Essential (primary) hypertension: Secondary | ICD-10-CM

## 2013-06-21 DIAGNOSIS — N400 Enlarged prostate without lower urinary tract symptoms: Secondary | ICD-10-CM

## 2013-06-21 DIAGNOSIS — N529 Male erectile dysfunction, unspecified: Secondary | ICD-10-CM

## 2013-06-21 DIAGNOSIS — Z0000 Encounter for general adult medical examination without abnormal findings: Secondary | ICD-10-CM

## 2013-06-21 DIAGNOSIS — J449 Chronic obstructive pulmonary disease, unspecified: Secondary | ICD-10-CM

## 2013-06-21 LAB — BASIC METABOLIC PANEL
BUN: 13 mg/dL (ref 6–23)
CO2: 29 mEq/L (ref 19–32)
Chloride: 103 mEq/L (ref 96–112)
GFR: 95.42 mL/min (ref >60.00)
Potassium: 5.5 mEq/L — ABNORMAL HIGH (ref 3.5–5.1)

## 2013-06-21 LAB — CBC WITH DIFFERENTIAL/PLATELET
Basophils Absolute: 0 10*3/uL (ref 0.0–0.1)
Eosinophils Absolute: 0.1 10*3/uL (ref 0.0–0.7)
Hemoglobin: 13.1 g/dL (ref 13.0–17.0)
Lymphocytes Relative: 18.7 % (ref 12.0–46.0)
Lymphs Abs: 1.5 10*3/uL (ref 0.7–4.0)
MCHC: 32.6 g/dL (ref 30.0–36.0)
Neutro Abs: 5.6 10*3/uL (ref 1.4–7.7)
Platelets: 326 10*3/uL (ref 150.0–400.0)
RDW: 16.8 % — ABNORMAL HIGH (ref 11.5–14.6)

## 2013-06-21 LAB — POCT URINALYSIS DIPSTICK
Bilirubin, UA: NEGATIVE
Blood, UA: NEGATIVE
Ketones, UA: NEGATIVE
Leukocytes, UA: NEGATIVE
Nitrite, UA: NEGATIVE
Protein, UA: NEGATIVE
Spec Grav, UA: 1.015
Urobilinogen, UA: 0.2
pH, UA: 6

## 2013-06-21 LAB — HEPATIC FUNCTION PANEL
ALT: 13 U/L (ref 0–53)
AST: 17 U/L (ref 0–37)
Albumin: 3.8 g/dL (ref 3.5–5.2)
Alkaline Phosphatase: 67 U/L (ref 39–117)
Total Protein: 7.3 g/dL (ref 6.0–8.3)

## 2013-06-21 LAB — TSH: TSH: 0.83 u[IU]/mL (ref 0.35–5.50)

## 2013-06-21 LAB — LIPID PANEL
Cholesterol: 171 mg/dL (ref 0–200)
HDL: 54.9 mg/dL (ref >39.00)
Total CHOL/HDL Ratio: 3
Triglycerides: 60 mg/dL (ref 0.0–149.0)

## 2013-06-21 MED ORDER — QUINAPRIL HCL 20 MG PO TABS: ORAL_TABLET | ORAL | Status: DC

## 2013-06-21 NOTE — Progress Notes (Signed)
Subjective:    Patient ID: Eric [name], male    DOB: 1940-09-26, 72 y.o.   MRN: [id number]  HPI Kalven is a 72 year old married male nonsmoker who comes in today for a Medicare wellness examination because of a history of hypertension and BPH  He says he takes his Accupril 20 mg daily BP today 1 8400. He does not recall when he took his blood pressure last at home. They've been checking it twice a month. Again he only said he missed one pill this morning's dose  He's had a history of BPH with mild outlet symptoms  He's also some COPD he's a next smoker.  Overall he felt says he feels well and has no problems. His weight is good at 160 pounds he walks on a daily basis. He lives at home with his wife and 2 sons.  Cognitive function normal he walks on a regular basis home health safety reviewed no issues identified, no guns in the house, he does not have a health care power of attorney and oral living well. I recommend he get that done this year   Review of Systems  Constitutional: Negative.   HENT: Negative.   Eyes: Negative.   Respiratory: Negative.   Cardiovascular: Negative.   Gastrointestinal: Negative.   Endocrine: Negative.   Genitourinary: Negative.   Musculoskeletal: Negative.   Skin: Negative.   Allergic/Immunologic: Negative.   Neurological: Negative.   Hematological: Negative.   Psychiatric/Behavioral: Negative.       review Objective:   Physical Exam  Nursing note and vitals reviewed. Constitutional: He is oriented to person, place, and time. He appears well-developed and well-nourished.  HENT:  Head: Normocephalic and atraumatic.  Right Ear: External ear normal.  Left Ear: External ear normal.  Nose: Nose normal.  Mouth/Throat: Oropharynx is clear and moist.  Eyes: Conjunctivae and EOM are normal. Pupils are equal, round, and reactive to light.  Neck: Normal range of motion. Neck supple. No JVD present. No tracheal deviation present. No thyromegaly  present.  Cardiovascular: Normal rate, regular rhythm, normal heart sounds and intact distal pulses.  Exam reveals no gallop and no friction rub.   No murmur heard. No carotid artery bruits  Pulmonary/Chest: Effort normal and breath sounds normal. No stridor. No respiratory distress. He has no wheezes. He has no rales. He exhibits no tenderness.  Abdominal: Soft. Bowel sounds are normal. He exhibits no distension and no mass. There is no tenderness. There is no rebound and no guarding.  Genitourinary: Rectum normal and penis normal. Guaiac negative stool. No penile tenderness.  2+  symmetrical nonnodular BPH  Musculoskeletal: Normal range of motion. He exhibits no edema and no tenderness.  Lymphadenopathy:    He has no cervical adenopathy.  Neurological: He is alert and oriented to person, place, and time. He has normal reflexes. No cranial nerve deficit. He exhibits normal muscle tone.  Skin: Skin is warm and dry. No rash noted. No erythema. No pallor.  Psychiatric: He has a normal mood and affect. His behavior is normal. Judgment and thought content normal.          Assessment & Plan:  Hypertension not at goal continue Accupril BP check daily followup in one week  BPH with mild outlet symptoms otherwise asymptomatic  Erectile dysfunction  COPD asymptomatic continue to avoid smoke  We gave him a Benicar and clonidine because his blood pressure was so high. It was 184/100. We monitored his BP every 20 minutes. BP prior to discharge  from the office 140/90

## 2013-06-21 NOTE — Patient Instructions (Signed)
Take you Accupril one tablet daily starting tomorrow.  Check your blood pressure daily in the morning  Return in one week for followup  When he returned bring a record of all your blood pressure readings and the machine

## 2013-06-29 ENCOUNTER — Ambulatory Visit (INDEPENDENT_AMBULATORY_CARE_PROVIDER_SITE_OTHER): Payer: Medicare Other | Admitting: Family Medicine

## 2013-06-29 ENCOUNTER — Encounter: Payer: Self-pay | Admitting: Family Medicine

## 2013-06-29 VITALS — BP 180/100 | Temp 98.0°F | Wt 161.0 lb

## 2013-06-29 DIAGNOSIS — I10 Essential (primary) hypertension: Secondary | ICD-10-CM

## 2013-06-29 NOTE — Progress Notes (Signed)
Subjective:    Patient ID: AGNES WALLEN, male    DOB: 03-07-1941, 72 y.o.   MRN: [id number]  HPI Aahan is a 72 year old male who comes in today for followup of hypertension  He's on Accupril 20 mg daily BP at home averages 130/70 BP here 180/100.  He forgot to bring his digital cuff   Review of Systems Review of systems otherwise negative    Objective:   Physical Exam  Well-developed well-nourished male in no acute distress BP right sitting position 180/100 pulse 70 and regular      Assessment & Plan:  Hypertension question at goal plan return on Monday at 2:30 with his blood pressure device

## 2013-06-29 NOTE — Patient Instructions (Signed)
Take your blood pressure medication daily  Check your blood pressure daily in the morning  Return Monday at 2:30 with your blood pressure readings and the device

## 2013-07-02 ENCOUNTER — Encounter (HOSPITAL_COMMUNITY): Payer: Self-pay | Admitting: Emergency Medicine

## 2013-07-02 ENCOUNTER — Encounter: Payer: Self-pay | Admitting: Internal Medicine

## 2013-07-02 ENCOUNTER — Ambulatory Visit (INDEPENDENT_AMBULATORY_CARE_PROVIDER_SITE_OTHER): Payer: Medicare Other | Admitting: Internal Medicine

## 2013-07-02 ENCOUNTER — Telehealth: Payer: Self-pay | Admitting: Family Medicine

## 2013-07-02 VITALS — BP 152/100 | HR 102 | Temp 98.8°F | Wt 160.2 lb

## 2013-07-02 DIAGNOSIS — J4489 Other specified chronic obstructive pulmonary disease: Secondary | ICD-10-CM

## 2013-07-02 DIAGNOSIS — N400 Enlarged prostate without lower urinary tract symptoms: Secondary | ICD-10-CM | POA: Diagnosis present

## 2013-07-02 DIAGNOSIS — G8190 Hemiplegia, unspecified affecting unspecified side: Secondary | ICD-10-CM | POA: Diagnosis present

## 2013-07-02 DIAGNOSIS — J449 Chronic obstructive pulmonary disease, unspecified: Secondary | ICD-10-CM | POA: Diagnosis present

## 2013-07-02 DIAGNOSIS — R4789 Other speech disturbances: Secondary | ICD-10-CM | POA: Diagnosis present

## 2013-07-02 DIAGNOSIS — J209 Acute bronchitis, unspecified: Secondary | ICD-10-CM

## 2013-07-02 DIAGNOSIS — I10 Essential (primary) hypertension: Secondary | ICD-10-CM

## 2013-07-02 DIAGNOSIS — Z8673 Personal history of transient ischemic attack (TIA), and cerebral infarction without residual deficits: Secondary | ICD-10-CM

## 2013-07-02 DIAGNOSIS — H5713 Ocular pain, bilateral: Secondary | ICD-10-CM

## 2013-07-02 DIAGNOSIS — G936 Cerebral edema: Secondary | ICD-10-CM | POA: Diagnosis present

## 2013-07-02 DIAGNOSIS — R414 Neurologic neglect syndrome: Secondary | ICD-10-CM | POA: Diagnosis present

## 2013-07-02 DIAGNOSIS — Z87891 Personal history of nicotine dependence: Secondary | ICD-10-CM

## 2013-07-02 DIAGNOSIS — H538 Other visual disturbances: Secondary | ICD-10-CM

## 2013-07-02 DIAGNOSIS — E785 Hyperlipidemia, unspecified: Secondary | ICD-10-CM | POA: Diagnosis present

## 2013-07-02 DIAGNOSIS — H5710 Ocular pain, unspecified eye: Secondary | ICD-10-CM

## 2013-07-02 DIAGNOSIS — I619 Nontraumatic intracerebral hemorrhage, unspecified: Principal | ICD-10-CM | POA: Diagnosis present

## 2013-07-02 LAB — CBC WITH DIFFERENTIAL/PLATELET
Basophils Absolute: 0 10*3/uL (ref 0.0–0.1)
Basophils Relative: 0 % (ref 0–1)
Eosinophils Relative: 0 % (ref 0–5)
Lymphocytes Relative: 12 % (ref 12–46)
MCHC: 34.3 g/dL (ref 30.0–36.0)
MCV: 79.1 fL (ref 78.0–100.0)
Neutro Abs: 10.7 10*3/uL — ABNORMAL HIGH (ref 1.7–7.7)
Neutrophils Relative %: 83 % — ABNORMAL HIGH (ref 43–77)
Platelets: 323 10*3/uL (ref 150–400)
RBC: 5.02 MIL/uL (ref 4.22–5.81)
RDW: 15.3 % (ref 11.5–15.5)
WBC: 12.9 10*3/uL — ABNORMAL HIGH (ref 4.0–10.5)

## 2013-07-02 MED ORDER — LEVOFLOXACIN 250 MG PO TABS: 250.0000 mg | ORAL_TABLET | Freq: Every day | ORAL | Status: DC

## 2013-07-02 MED ORDER — HYDROCODONE-HOMATROPINE 5-1.5 MG/5ML PO SYRP
5.0000 mL | ORAL_SOLUTION | Freq: Four times a day (QID) | ORAL | Status: DC | PRN
Start: 2013-07-02 — End: 2013-07-03

## 2013-07-02 MED ORDER — TAMSULOSIN HCL 0.4 MG PO CAPS: 0.4000 mg | ORAL_CAPSULE | Freq: Every day | ORAL | Status: DC

## 2013-07-02 NOTE — Patient Instructions (Addendum)
Please take all new medication as prescribed - the antibiotic, cough medicine, and generic for flomax to help with urination  You will be contacted regarding the referral for: Opthamology  - eye doctor (to see [facility] now)  Please continue all other medications as before, and refills have been done if requested. Please have the pharmacy call with any other refills you may need.  Please go to the LAB in the Basement (turn left off the elevator) for the tests to be done today - just the urine test today

## 2013-07-02 NOTE — Telephone Encounter (Signed)
Noted  

## 2013-07-02 NOTE — Assessment & Plan Note (Signed)
Mild to mod, for antibx course,  to f/u any worsening symptoms or concerns 

## 2013-07-02 NOTE — Telephone Encounter (Signed)
Patient Information:  Caller Name: Hilda Lias  Phone: [phone]  Patient: Eric [name]  Gender: Male  DOB: 06-18-1941  Age: 72 Years  PCP: Kelle Darting [facility])  Office Follow Up:  Does the office need to follow up with this patient?: No  Instructions For The Office: N/A  RN Note:  Pt's headache is not relieved by homecare measures. His wife would like to take him to the ED as Dr. Tawanna Cooler is not in. An appointment was offered, accepted and made for 1345 with Dr. Jonny Ruiz at the [facility].   Symptoms  Reason For Call & Symptoms: Headache, eye pain  Reviewed Health History In EMR: Yes  Reviewed Medications In EMR: Yes  Reviewed Allergies In EMR: Yes  Reviewed Surgeries / Procedures: Yes  Date of Onset of Symptoms: 07/01/2013  Guideline(s) Used:  Headache  Disposition Per Guideline:   Go to ED Now (or to Office with PCP Approval)  Reason For Disposition Reached:   Severe headache  Advice Given:  Call Back If:  You become worse.  Patient Will Follow Care Advice:  YES

## 2013-07-02 NOTE — Progress Notes (Signed)
Subjective:    Patient ID: Eric [name], male    DOB: 01/26/1941, 72 y.o.   MRN: [id number]  HPI  Here with acute onset 2 days bilat eye pain (same type pain), severe, constant, better and worse during the day, dull with occas flashes of worse pain, has some HA and neck pain and right upper back and shoulder pain noted as well;  Eye pain worse to cough (some prod and new, no blood) and getting up out of bed to go to BR after lying down; also couldn't read yest am due to blurry vision , hasnt tried to read since then, still had some blurry vision this am, Describes some halos in vision and flashing lights 3 mo ago, but none more recent.   Pt denies new neurological symptoms such as facial or extremity weakness or numbness.  Walks with cane today only, no recent falls.  Also mentions some difficulty urinating x 2 wks, with the feeling cant get all urine out, have to go back a short time later to try again, sometimes small amounts, some larger.  Has some discomfort with straining to start, o/w no dysuria, and Denies urinary symptoms such as flank pain, hematuria or n/v, fever, chills, though has sweats and feels hot and cold sometimes.  No sinus congestion or pain, no ear pain, no ST. Though Here with acute onset mild to mod 2-3 days ST, HA, general weakness and malaise, with prod cough, but Pt denies chest pain, increased sob or doe, wheezing, orthopnea, PND, increased LE swelling, palpitations, dizziness or syncope. Bp at home per pt < 140/90 usually Past Medical History  Diagnosis Date  . COPD (chronic obstructive pulmonary disease)   . Hypertension   . Benign prostatic hypertrophy   . ED (erectile dysfunction)   . Gynecomastia, male     bilateral breast removal   Past Surgical History  Procedure Laterality Date  . Bilateral breast removal    . Colonoscopy  04/24/05    reports that he has quit smoking. He does not have any smokeless tobacco history on file. His alcohol and drug histories are  not on file. family history includes Diabetes in his other; Hypertension in his other. No Known Allergies Current Outpatient Prescriptions on File Prior to Visit  Medication Sig Dispense Refill  . aspirin 81 MG tablet Take 81 mg by mouth daily.        . quinapril (ACCUPRIL) 20 MG tablet TAKE 1 TABLET EVERY DAY  100 tablet  3   No current facility-administered medications on file prior to visit.   Review of Systems  Constitutional: Negative for unexpected weight change, or unusual diaphoresis  HENT: Negative for tinnitus.    Respiratory: Negative for choking and stridor.   Gastrointestinal: Negative for vomiting and blood in stool.  Genitourinary: Negative for hematuria and decreased urine volume.  Musculoskeletal: Negative for acute joint swelling Skin: Negative for color change and wound.  Neurological: Negative for tremors and numbness other than noted  Psychiatric/Behavioral: Negative for decreased concentration or  hyperactivity.      Objective:   Physical Exam BP 152/100  Pulse 102  Temp(Src) 98.8 F (37.1 C) (Oral)  Wt 160 lb 4 oz (72.689 kg)  BMI 24.37 kg/m2  SpO2 96% VS noted,  Constitutional: Pt appears well-developed and well-nourished.  HENT: Head: NCAT.  Right Ear: External ear normal.  Left Ear: External ear normal.  Eyes: Conjunctivae and EOM are normal. Pupils are equal, round, and reactive to light. but  both tender to palpation, no visible swelling or erythema No sinus tender, pharynx benign, TM's bilat clear Neck: Normal range of motion. Neck supple. No LA Cardiovascular: Normal rate and regular rhythm.   Pulmonary/Chest: Effort normal and breath sounds decreased, no rales or wheezing.  Abd: soft, NT, +BS Neurological: Pt is alert. Not confused , cn 2-12 intact, visual fields intact, motor 5/5, spine nontender Skin: Skin is warm. No erythema. No LE edema Psychiatric: Pt behavior is normal. Thought content normal. mild nervous    Assessment & Plan:

## 2013-07-02 NOTE — Assessment & Plan Note (Signed)
stable overall by history and exam, recent data reviewed with pt, and pt to continue medical treatment as before,  to f/u any worsening symptoms or concerns SpO2 Readings from Last 3 Encounters:  07/02/13 96%

## 2013-07-02 NOTE — Assessment & Plan Note (Addendum)
?   Glaucoma related, tender noted but o/w exam without change except for blurred vision complaint as well;  For urgent optho eval today if possible,  to f/u any worsening symptoms or concerns, consider Head MRI  Note:  Total time for pt hx, exam, review of record with pt in the room, determination of diagnoses and plan for further eval and tx is > 40 min, with over 50% spent in coordination and counseling of patient

## 2013-07-02 NOTE — Assessment & Plan Note (Signed)
elev today, mild to mod, likely reactive, for other tx as above today, cont to monitor, and f/u PCP

## 2013-07-02 NOTE — ED Notes (Signed)
Family reported brief slurred speech at noon today with headache , speech clear at arrival , no facial asymmetry , equal strong grips , no arm drift.

## 2013-07-02 NOTE — Assessment & Plan Note (Signed)
With worsening recent symptoms, pt declines dre today, for trial flomax asd and urine studies

## 2013-07-03 ENCOUNTER — Inpatient Hospital Stay (HOSPITAL_COMMUNITY)
Admission: EM | Admit: 2013-07-03 | Discharge: 2013-07-06 | DRG: 064 | Disposition: A | Discharge status: Home Health | Payer: Medicare Other | Attending: Neurology | Admitting: Neurology

## 2013-07-03 ENCOUNTER — Ambulatory Visit (HOSPITAL_COMMUNITY): Payer: Medicare Other

## 2013-07-03 ENCOUNTER — Inpatient Hospital Stay (HOSPITAL_COMMUNITY): Payer: Medicare Other

## 2013-07-03 DIAGNOSIS — I629 Nontraumatic intracranial hemorrhage, unspecified: Secondary | ICD-10-CM | POA: Diagnosis present

## 2013-07-03 DIAGNOSIS — J4489 Other specified chronic obstructive pulmonary disease: Secondary | ICD-10-CM | POA: Diagnosis present

## 2013-07-03 DIAGNOSIS — J449 Chronic obstructive pulmonary disease, unspecified: Secondary | ICD-10-CM | POA: Diagnosis present

## 2013-07-03 DIAGNOSIS — I10 Essential (primary) hypertension: Secondary | ICD-10-CM

## 2013-07-03 DIAGNOSIS — I619 Nontraumatic intracerebral hemorrhage, unspecified: Secondary | ICD-10-CM | POA: Diagnosis present

## 2013-07-03 DIAGNOSIS — E785 Hyperlipidemia, unspecified: Secondary | ICD-10-CM | POA: Diagnosis present

## 2013-07-03 DIAGNOSIS — I369 Nonrheumatic tricuspid valve disorder, unspecified: Secondary | ICD-10-CM

## 2013-07-03 LAB — COMPREHENSIVE METABOLIC PANEL
ALT: 10 U/L (ref 0–53)
AST: 15 U/L (ref 0–37)
Albumin: 3.4 g/dL — ABNORMAL LOW (ref 3.5–5.2)
Alkaline Phosphatase: 70 U/L (ref 39–117)
BUN: 10 mg/dL (ref 6–23)
Calcium: 9.3 mg/dL (ref 8.4–10.5)
Chloride: 99 mEq/L (ref 96–112)
Creatinine, Ser: 0.9 mg/dL (ref 0.50–1.35)
Potassium: 4.3 mEq/L (ref 3.5–5.1)
Sodium: 137 mEq/L (ref 135–145)
Total Bilirubin: 0.4 mg/dL (ref 0.3–1.2)
Total Protein: 7.2 g/dL (ref 6.0–8.3)

## 2013-07-03 LAB — GLUCOSE, CAPILLARY
Glucose-Capillary: 175 mg/dL — ABNORMAL HIGH (ref 70–99)
Glucose-Capillary: 63 mg/dL — ABNORMAL LOW (ref 70–99)

## 2013-07-03 LAB — POCT I-STAT TROPONIN I: Troponin i, poc: 0 ng/mL (ref 0.00–0.08)

## 2013-07-03 LAB — MRSA PCR SCREENING: MRSA by PCR: NEGATIVE

## 2013-07-03 MED ORDER — ACETAMINOPHEN 325 MG PO TABS
650.0000 mg | ORAL_TABLET | ORAL | Status: DC | PRN
  Administered 2013-07-03: 650 mg via ORAL
  Filled 2013-07-03: qty 2

## 2013-07-03 MED ORDER — SENNOSIDES-DOCUSATE SODIUM 8.6-50 MG PO TABS
1.0000 | ORAL_TABLET | Freq: Two times a day (BID) | ORAL | Status: DC
  Administered 2013-07-03 – 2013-07-06 (×6): 1 via ORAL
  Filled 2013-07-03 (×8): qty 1

## 2013-07-03 MED ORDER — PANTOPRAZOLE SODIUM 40 MG IV SOLR
40.0000 mg | Freq: Every day | INTRAVENOUS | Status: DC
  Administered 2013-07-03 (×2): 40 mg via INTRAVENOUS
  Filled 2013-07-03 (×3): qty 40

## 2013-07-03 MED ORDER — BOOST / RESOURCE BREEZE PO LIQD
1.0000 | ORAL | Status: DC
  Administered 2013-07-03 – 2013-07-04 (×2): 1 via ORAL

## 2013-07-03 MED ORDER — LABETALOL HCL 5 MG/ML IV SOLN
0.5000 mg/min | INTRAVENOUS | Status: DC
  Administered 2013-07-03: 1 mg/min via INTRAVENOUS
  Filled 2013-07-03: qty 100

## 2013-07-03 MED ORDER — ONDANSETRON HCL 4 MG/2ML IJ SOLN
4.0000 mg | Freq: Four times a day (QID) | INTRAMUSCULAR | Status: DC | PRN
  Administered 2013-07-04 – 2013-07-05 (×4): 4 mg via INTRAVENOUS
  Filled 2013-07-03 (×4): qty 2

## 2013-07-03 MED ORDER — LABETALOL HCL 5 MG/ML IV SOLN
10.0000 mg | INTRAVENOUS | Status: DC | PRN
  Administered 2013-07-03 (×2): 40 mg via INTRAVENOUS
  Administered 2013-07-04: 10 mg via INTRAVENOUS
  Administered 2013-07-04: 40 mg via INTRAVENOUS
  Filled 2013-07-03: qty 4
  Filled 2013-07-03 (×3): qty 8

## 2013-07-03 MED ORDER — DEXTROSE 50 % IV SOLN
50.0000 mL | Freq: Once | INTRAVENOUS | Status: AC
  Administered 2013-07-03: 50 mL via INTRAVENOUS
  Filled 2013-07-03: qty 50

## 2013-07-03 MED ORDER — SODIUM CHLORIDE 0.9 % IV SOLN
INTRAVENOUS | Status: DC
  Administered 2013-07-03: 06:00:00 via INTRAVENOUS
  Administered 2013-07-03: 1000 mL via INTRAVENOUS
  Administered 2013-07-03 – 2013-07-05 (×4): via INTRAVENOUS

## 2013-07-03 MED ORDER — LABETALOL HCL 5 MG/ML IV SOLN: 20.0000 mg | Freq: Once | INTRAVENOUS | Status: DC

## 2013-07-03 MED ORDER — OXYCODONE HCL 5 MG PO TABS
5.0000 mg | ORAL_TABLET | ORAL | Status: DC | PRN
  Administered 2013-07-03 – 2013-07-04 (×6): 5 mg via ORAL
  Filled 2013-07-03 (×7): qty 1

## 2013-07-03 MED ORDER — ACETAMINOPHEN 650 MG RE SUPP
650.0000 mg | RECTAL | Status: DC | PRN
  Administered 2013-07-03: 650 mg via RECTAL
  Filled 2013-07-03: qty 1

## 2013-07-03 MED ORDER — HYDROMORPHONE HCL PF 1 MG/ML IJ SOLN
1.0000 mg | Freq: Once | INTRAMUSCULAR | Status: AC
  Administered 2013-07-03: 1 mg via INTRAVENOUS
  Filled 2013-07-03: qty 1

## 2013-07-03 NOTE — Progress Notes (Signed)
*  PRELIMINARY RESULTS* Vascular Ultrasound Carotid Duplex (Doppler) has been completed.  Preliminary findings: Bilateral:  1-39% ICA stenosis.  Vertebral artery flow is antegrade.      Farrel Demark, RDMS, RVT  07/03/2013, 12:16 PM

## 2013-07-03 NOTE — Progress Notes (Signed)
  Echocardiogram 2D Echocardiogram has been performed.  Eric [name] 07/03/2013, 3:05 PM

## 2013-07-03 NOTE — Progress Notes (Signed)
Pt reports nausea.  Full neuro assessment completed with no changes from previous assessment.  Pain medication administered previously for 10/10 pain in the head.  The nausea began shortly after the pain intensified.  Annie Main, NP notified.  New orders rec'd.  Will closely monitor neuro status for signs of potential advancing hemorrhage.  Clemmie Buelna, [facility]

## 2013-07-03 NOTE — ED Provider Notes (Signed)
CSN: [id number]     Arrival date & time 07/02/13  2252 History   First MD Initiated Contact with Patient 07/03/13 0009     Chief Complaint  Patient presents with  . Aphasia   (Consider location/radiation/quality/duration/timing/severity/associated sxs/prior Treatment) The history is provided by the patient.  AMIL BOUWMAN is a 72 y.o. male history of COPD, hypertension here presenting with headache and slurred speech. He had a headache since yesterday that is very severe. Around noontime he then had worsening headache as well as some slurred speech. He said the slurred speech resolved but denies any numbness or weakness anywhere.    Past Medical History  Diagnosis Date  . COPD (chronic obstructive pulmonary disease)   . Hypertension   . Benign prostatic hypertrophy   . ED (erectile dysfunction)   . Gynecomastia, male     bilateral breast removal   Past Surgical History  Procedure Laterality Date  . Bilateral breast removal    . Colonoscopy  04/24/05   Family History  Problem Relation Age of Onset  . Diabetes Other   . Hypertension Other    History  Substance Use Topics  . Smoking status: Former Games developer  . Smokeless tobacco: Not on file  . Alcohol Use: No    Review of Systems  Neurological: Positive for speech difficulty and headaches.  All other systems reviewed and are negative.    Allergies  Review of patient's allergies indicates no known allergies.  Home Medications   Current Outpatient Rx  Name  Route  Sig  Dispense  Refill  . aspirin 81 MG tablet   Oral   Take 81 mg by mouth daily.           . quinapril (ACCUPRIL) 20 MG tablet      TAKE 1 TABLET EVERY DAY   100 tablet   3   . HYDROcodone-homatropine (HYCODAN) 5-1.5 MG/5ML syrup   Oral   Take 5 mLs by mouth every 6 (six) hours as needed for cough.   180 mL   0   . levofloxacin (LEVAQUIN) 250 MG tablet   Oral   Take 1 tablet (250 mg total) by mouth daily.   10 tablet   0   . tamsulosin  (FLOMAX) 0.4 MG CAPS capsule   Oral   Take 1 capsule (0.4 mg total) by mouth daily.   90 capsule   3    BP 172/93  Pulse 114  Temp(Src) 98.6 F (37 C) (Oral)  Resp 11  SpO2 97% Physical Exam  Nursing note and vitals reviewed. Constitutional: He is oriented to person, place, and time. He appears well-developed.  Speech normal   HENT:  Head: Atraumatic.  Mouth/Throat: Oropharynx is clear and moist.  Slight eye deviation to R but able to look to left.   Eyes: Conjunctivae are normal. Pupils are equal, round, and reactive to light.  Neck: Normal range of motion. Neck supple.  Cardiovascular: Normal rate, regular rhythm and normal heart sounds.   Pulmonary/Chest: Effort normal and breath sounds normal. No respiratory distress. He has no wheezes. He has no rales.  Abdominal: Soft. Bowel sounds are normal. He exhibits no distension. There is no tenderness. There is no rebound and no guarding.  Musculoskeletal: Normal range of motion.  Neurological: He is alert and oriented to person, place, and time.  CN 2-12 intact. No facial droop. Nl strength and sensation throughout. No pronator drift.   Skin: Skin is warm and dry.  Psychiatric: He has  a normal mood and affect. His behavior is normal. Judgment and thought content normal.    ED Course  Procedures (including critical care time)  CRITICAL CARE Performed by: Silverio Lay, Gorman Safi   Total critical care time: 30 min   Critical care time was exclusive of separately billable procedures and treating other patients.  Critical care was necessary to treat or prevent imminent or life-threatening deterioration.  Critical care was time spent personally by me on the following activities: development of treatment plan with patient and/or surrogate as well as nursing, discussions with consultants, evaluation of patient's response to treatment, examination of patient, obtaining history from patient or surrogate, ordering and performing treatments and  interventions, ordering and review of laboratory studies, ordering and review of radiographic studies, pulse oximetry and re-evaluation of patient's condition.   Labs Review Labs Reviewed  CBC WITH DIFFERENTIAL - Abnormal; Notable for the following:    WBC 12.9 (*)    Neutrophils Relative % 83 (*)    Neutro Abs 10.7 (*)    All other components within normal limits  GLUCOSE, CAPILLARY - Abnormal; Notable for the following:    Glucose-Capillary 63 (*)    All other components within normal limits  COMPREHENSIVE METABOLIC PANEL   Imaging Review Ct Head (brain) Wo Contrast  07/03/2013   CLINICAL DATA:  Headache in a aphasia  EXAM: CT HEAD WITHOUT CONTRAST  TECHNIQUE: Contiguous axial images were obtained from the base of the skull through the vertex without intravenous contrast.  COMPARISON:  None.  FINDINGS: Mild degradation due to patient motion.  Skull and Sinuses:No significant abnormality.  Orbits: No acute abnormality.  Brain: There is a 4 x 3 cm (maximal transaxial span) hematoma in the right temporal and parietal lobes, with a moderate amount of surrounding low-attenuation. Hemorrhage extends to the surface of the brain, without evidence of subdural or subarachnoid blood (high-density of the falx and tentorium is likely mineralization). Local mass effect effaces the right lateral ventricle and results in 5 mm of midline shift at the foramina Lankin.  The background brain is atrophic, in keeping with age. There been remote lenticulostriate infarctions bilaterally, with the larger on the right - which affects the putamen, caudate head, and intervening white matter tracts. No hydrocephalus.  Critical Value/emergent results were called by telephone at the time of interpretation on 07/03/2013 at 12:05 AM to [facility], who verbally acknowledged these results.  IMPRESSION: 1. 4 cm parenchymal hematoma in the right temporal parietal region, with moderate vasogenic edema. 5 mm leftward midline shift. 2.  Remote bilateral lenticulostriate infarcts.   Electronically Signed   By: Tiburcio Pea M.D.   On: 07/03/2013 00:20    EKG Interpretation     Ventricular Rate:  95 PR Interval:  164 QRS Duration: 89 QT Interval:  374 QTC Calculation: 470 R Axis:   75 Text Interpretation:  Sinus rhythm No previous ECGs available            MDM  No diagnosis found. MADDIX HEINZ is a 72 y.o. male here with headache. CT showed parenchymal hematoma. I am concerned for hemorrhagic stroke. He is hypertensive so will start labetalol drip. I called Dr. Roseanne Reno from neuro, who will see patient.   1:18 AM Neuro will admit to neuro ICU for monitoring. BP improved on labetalol drip.    Richardean Canal, MD 07/03/13 [phone]

## 2013-07-03 NOTE — ED Notes (Signed)
Family at bedside. 

## 2013-07-03 NOTE — Progress Notes (Signed)
INITIAL NUTRITION ASSESSMENT  DOCUMENTATION CODES Per approved criteria  -Not Applicable   INTERVENTION: Add Resource Breeze po daily, each supplement provides 250 kcal and 9 grams of protein. RD to continue to follow nutrition care plan.  NUTRITION DIAGNOSIS: Increased nutrient needs related to acute illness as evidenced by estimated needs.   Goal: Intake to meet >90% of estimated nutrition needs.  Monitor:  weight trends, lab trends, I/O's, PO intake, supplement tolerance  Reason for Assessment: Malnutrition Screening Tool  72 y.o. male  Admitting Dx: intraparenchymal hemorrhage   ASSESSMENT: PMHx significant for COPD, HTN. Admitted with new onset slurred speech, L facial weakness and L hemiparesis. Work-up reveals R temporal intraparenchymal hemorrhage.  BSE completed by SLP on 11/1 with recommendations for Regular diet with thin liquids.  Pt with 6% wt loss x 1 month based on current weight of 150 lb, however pt weighed 160 lb yesterday per [facility] records, thus question accuracy of weight. Pt is currently off unit for MRI, unable to complete physical assessment or obtain nutrition hx at this time.  Pt is at nutrition risk given possible significant weight loss and current acute and chronic medical issues.  Height: Ht Readings from Last 1 Encounters:  07/03/13 5\' 7"  (1.702 m)    Weight: Wt Readings from Last 1 Encounters:  07/03/13 150 lb 12.7 oz (68.4 kg)    Ideal Body Weight: 148 lb  % Ideal Body Weight: 101%  Wt Readings from Last 10 Encounters:  07/03/13 150 lb 12.7 oz (68.4 kg)  07/02/13 160 lb 4 oz (72.689 kg)  06/29/13 161 lb (73.029 kg)  06/21/13 160 lb (72.576 kg)  06/04/12 169 lb (76.658 kg)  09/17/11 170 lb (77.111 kg)  06/17/11 166 lb (75.297 kg)  06/03/11 164 lb (74.39 kg)  06/01/10 164 lb (74.39 kg)  05/29/09 165 lb (74.844 kg)    Usual Body Weight: 160 lb  % Usual Body Weight: 94%  BMI:  Body mass index is 23.61 kg/(m^2).  WNL  Estimated Nutritional Needs: Kcal: 1700 - 1850 Protein: 70-80g Fluid: 1.7 - 2 liters  Skin: intact  Diet Order: Cardiac  EDUCATION NEEDS: -No education needs identified at this time   Intake/Output Summary (Last 24 hours) at 07/03/13 1557 Last data filed at 07/03/13 1400  Gross per 24 hour  Intake 1058.75 ml  Output    650 ml  Net 408.75 ml    Last BM: PTA  Labs:   Recent Labs Lab 07/03/13 0127  NA 137  K 4.3  CL 99  CO2 25  BUN 10  CREATININE 0.90  CALCIUM 9.3  GLUCOSE 84    CBG (last 3)   Recent Labs  07/03/13 0025 07/03/13 0126 07/03/13 0715  GLUCAP 63* 175* 101*    Scheduled Meds: . pantoprazole (PROTONIX) IV  40 mg Intravenous QHS  . senna-docusate  1 tablet Oral BID    Continuous Infusions: . sodium chloride 75 mL/hr at 07/03/13 0541  . labetalol (NORMODYNE) infusion 0.5 mg/min (07/03/13 0129)    Past Medical History  Diagnosis Date  . COPD (chronic obstructive pulmonary disease)   . Hypertension   . Benign prostatic hypertrophy   . ED (erectile dysfunction)   . Gynecomastia, male     bilateral breast removal    Past Surgical History  Procedure Laterality Date  . Bilateral breast removal    . Colonoscopy  04/24/05    Jarold Motto MS, RD, LDN Pager: [phone] After-hours pager: [phone]

## 2013-07-03 NOTE — Progress Notes (Addendum)
Stroke Team Progress Note  HISTORY Eric [name] is an 72 y.o. male history COPD, hypertension and BPH, presenting with new onset slurred speech, left facial weakness and left hemiparesthesias as well as headache. CT scan of his head showed a 4 cm intraparenchymal hemorrhage involving the right temporal and parietal region with surrounding edema and mass effect. No clinical history of stroke. 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 He's been taking aspirin 81 mg per day for antiplatelet therapy. NIH stroke score was 14. He was last known well at bedtime at 12:30 a.m. on 07/02/2013.  Patient was not a TPA candidate secondary to ICH. He was admitted to the neuro ICU for further evaluation and treatment.  SUBJECTIVE No family is at the bedside.  Overall he feels his condition is stable. He complains of a headache.  OBJECTIVE Most recent Vital Signs: Filed Vitals:   07/03/13 0600 07/03/13 0700 07/03/13 0716 07/03/13 0800  BP: 152/69 143/69  139/62  Pulse: 67 64  62  Temp: 99 F (37.2 C)  98.6 F (37 C)   TempSrc: Oral  Oral   Resp: 15 14  14   Height:      Weight:      SpO2: 100% 99%  99%   CBG (last 3)   Recent Labs  07/03/13 0025 07/03/13 0126 07/03/13 0715  GLUCAP 63* 175* 101*    IV Fluid Intake:   . sodium chloride 75 mL/hr at 07/03/13 0541  . labetalol (NORMODYNE) infusion 0.5 mg/min (07/03/13 0129)    MEDICATIONS  . pantoprazole (PROTONIX) IV  40 mg Intravenous QHS  . senna-docusate  1 tablet Oral BID   PRN:  acetaminophen, acetaminophen, labetalol  Diet:  NPO  Activity:  Bedrest DVT Prophylaxis:  SCDs   CLINICALLY SIGNIFICANT STUDIES Basic Metabolic Panel:  Recent Labs Lab 07/03/13 0127  NA 137  K 4.3  CL 99  CO2 25  GLUCOSE 84  BUN 10  CREATININE 0.90  CALCIUM 9.3   Liver Function Tests:  Recent Labs Lab 07/03/13 0127  AST 15  ALT 10  ALKPHOS 70  BILITOT 0.4  PROT 7.2  ALBUMIN 3.4*   CBC:  Recent Labs Lab 07/02/13 2317  WBC 12.9*   NEUTROABS 10.7*  HGB 13.6  HCT 39.7  MCV 79.1  PLT 323   Coagulation: No results found for this basename: LABPROT, INR,  in the last 168 hours Cardiac Enzymes: No results found for this basename: CKTOTAL, CKMB, CKMBINDEX, TROPONINI,  in the last 168 hours Urinalysis: No results found for this basename: COLORURINE, APPERANCEUR, LABSPEC, PHURINE, GLUCOSEU, HGBUR, BILIRUBINUR, KETONESUR, PROTEINUR, UROBILINOGEN, NITRITE, LEUKOCYTESUR,  in the last 168 hours Lipid Panel    Component Value Date/Time   CHOL 171 06/21/2013 1038   TRIG 60.0 06/21/2013 1038   HDL 54.90 06/21/2013 1038   CHOLHDL 3 06/21/2013 1038   VLDL 12.0 06/21/2013 1038   LDLCALC 104* 06/21/2013 1038   HgbA1C  No results found for this basename: HGBA1C    Urine Drug Screen:   No results found for this basename: labopia, cocainscrnur, labbenz, amphetmu, thcu, labbarb    Alcohol Level: No results found for this basename: ETH,  in the last 168 hours  CT of the brain  07/03/2013   1. 4 cm parenchymal hematoma in the right temporal parietal region, with moderate vasogenic edema. 5 mm leftward midline shift. 2. Remote bilateral lenticulostriate infarcts.  MRI of the brain    MRA of the brain    2D Echocardiogram  Carotid Doppler    CXR    EKG  normal sinus rhythm, no prior EKG to compare  Therapy Recommendations   Physical Exam   HEENT- Normocephalic, no lesions, without obvious abnormality. Normal external eye and conjunctiva. Normal TM's bilaterally. Normal auditory canals and external ears. Normal external nose, mucus membranes and septum. Normal pharynx.  Neck supple with no masses, nodes, nodules or enlargement.  Cardiovascular - regular rate and rhythm, S1, S2 normal, no murmur, click, rub or gallop  Lungs - chest clear, no wheezing, rales, normal symmetric air entry, Heart exam - S1, S2 normal, no murmur, no gallop, rate regular  Abdomen - soft, non-tender; bowel sounds normal; no masses, no  organomegaly, as his lower abdominal bruit noted.  Extremities - no joint deformities, effusion, or inflammation and no edema  Neurologic Examination:  Mental Status:  Lethargic and, oriented x3. Speech moderately slurred without evidence of aphasia. Able to follow commands with use of right extremities; neglect of left side.  Cranial Nerves:  II-dense left homonymous hemianopsia.  III/IV/VI-Pupils were equal and reacted. Eyes midline V/VII-left facial numbness and moderate left lower facial weakness.  VIII-normal.  X-moderately slurred speech.  Motor: Moderately severe proximal and distal weakness of left upper and lower extremities improoved; normal strength of right extremities. Muscle tone was increased in left extremities.  Sensory: Mark sensory neglect of left side, including to tactile stimulation.and  Deep Tendon Reflexes: 2+ on the right and 3+ on the left .  Plantars: mute on the right and extensor on the left  Cerebellar: not tested and.   ASSESSMENT [name] is a 72 y.o. male presenting with headache, slurred speech and left hemiparesis. 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 Current hemorrhage not likely due to hypertension given location, however BP 184/100 on arrival; suspect amyloid vs underlying mass vs AVM/other structural abnormality.   On aspirin 81 mg orally every day prior to admission. Work up underway.  Hypertension COPD  BPH  Hospital day # 0  TREATMENT/PLAN  Continue ICU monitoring  Oxycodone for headache management  Complete stroke workup - MRI, MRA, Carotids, 2D  ST assess swallow  Monitor BP  CT in am  Annie Main, MSN, RN, ANVP-BC, ANP-BC, GNP-BC [facility] Pager: [phone] 07/03/2013 9:29 AM  I have personally obtained a history, examined the patient, evaluated imaging results, and  formulated the assessment and plan of care. I agree with the above.  MRI/MRA to r/out AVM. Swallow eval. Pauletta Browns

## 2013-07-03 NOTE — Evaluation (Signed)
Clinical/Bedside Swallow Evaluation Patient Details  Name: Eric [name] MRN: [id number] Date of Birth: 25-Jul-1941  Today's Date: 07/03/2013 Time: 0950-1005 SLP Time Calculation (min): 15 min  Past Medical History:  Past Medical History  Diagnosis Date  . COPD (chronic obstructive pulmonary disease)   . Hypertension   . Benign prostatic hypertrophy   . ED (erectile dysfunction)   . Gynecomastia, male     bilateral breast removal   Past Surgical History:  Past Surgical History  Procedure Laterality Date  . Bilateral breast removal    . Colonoscopy  04/24/05   HPI:  Eric [name] is an 72 y.o. male history COPD, hypertension and BPH, presenting with new onset slurred speech, left facial weakness and left hemiparesthesias as well as headache. CT scan of his head showed a 4 cm intraparenchymal hemorrhage involving the right temporal and parietal region with surrounding edema and mass effect. No clinical history of stroke. 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    Assessment / Plan / Recommendation Clinical Impression  Patient presents with a functional oropharyngeal swallow without overt indication of aspiration. No SLP f/u for dysphagia indicated.     Aspiration Risk  Mild    Diet Recommendation Regular;Thin liquid   Liquid Administration via: Cup;Straw Medication Administration: Whole meds with liquid Supervision: Patient able to self feed;Intermittent supervision to cue for compensatory strategies Compensations: Slow rate;Small sips/bites Postural Changes and/or Swallow Maneuvers: Seated upright 90 degrees    Other  Recommendations Oral Care Recommendations: Oral care BID   Follow Up Recommendations  None (for dysphagia)       Pertinent Vitals/Pain None        Swallow Study    General HPI: Eric [name] is an 72 y.o. male history COPD, hypertension and BPH, presenting with new onset slurred speech, left facial weakness and left hemiparesthesias as well  as headache. CT scan of his head showed a 4 cm intraparenchymal hemorrhage involving the right temporal and parietal region with surrounding edema and mass effect. No clinical history of stroke. 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  Type of Study: Bedside swallow evaluation Previous Swallow Assessment: none Diet Prior to this Study: NPO Temperature Spikes Noted: No History of Recent Intubation: No Behavior/Cognition: Alert;Cooperative;Pleasant mood Oral Cavity - Dentition: Adequate natural dentition Self-Feeding Abilities: Able to feed self Patient Positioning: Upright in bed Baseline Vocal Quality: Clear Volitional Cough: Strong Volitional Swallow: Able to elicit    Oral/Motor/Sensory Function Overall Oral Motor/Sensory Function: Impaired Labial ROM: Within Functional Limits Labial Symmetry: Abnormal symmetry left Labial Strength: Within Functional Limits Labial Sensation: Within Functional Limits Lingual ROM: Within Functional Limits Lingual Symmetry: Within Functional Limits Lingual Strength: Within Functional Limits Lingual Sensation: Within Functional Limits Facial ROM: Within Functional Limits Facial Symmetry: Within Functional Limits Facial Strength: Within Functional Limits Facial Sensation: Within Functional Limits Velum: Within Functional Limits Mandible: Within Functional Limits   Ice Chips Ice chips: Not tested   Thin Liquid Thin Liquid: Within functional limits Presentation: Cup;Self Fed;Straw    Nectar Thick Nectar Thick Liquid: Not tested   Honey Thick Honey Thick Liquid: Not tested   Puree Puree: Within functional limits Presentation: Self Fed;Spoon   Solid   GO   Alazar Cherian MA, CCC-SLP [phone]  Solid: Within functional limits Presentation: Self Fed       Kotaro Buer Meryl 07/03/2013,10:44 AM

## 2013-07-03 NOTE — Evaluation (Signed)
Speech Language Pathology Evaluation Patient Details Name: Eric [name] MRN: [id number] DOB: 1940/10/22 Today's Date: 07/03/2013 Time: 1005-1020 SLP Time Calculation (min): 15 min  Problem List:  Patient Active Problem List   Diagnosis Date Noted  . ICH (intracerebral hemorrhage) 07/03/2013  . Pain of both eyes 07/02/2013  . Acute bronchitis 07/02/2013  . 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 HYPERTENSION 04/10/2007  . COPD 04/10/2007  . BENIGN PROSTATIC HYPERTROPHY 04/10/2007   Past Medical History:  Past Medical History  Diagnosis Date  . COPD (chronic obstructive pulmonary disease)   . Hypertension   . Benign prostatic hypertrophy   . ED (erectile dysfunction)   . Gynecomastia, male     bilateral breast removal   Past Surgical History:  Past Surgical History  Procedure Laterality Date  . Bilateral breast removal    . Colonoscopy  04/24/05   HPI:  Eric [name] is an 72 y.o. male history COPD, hypertension and BPH, presenting with new onset slurred speech, left facial weakness and left hemiparesthesias as well as headache. CT scan of his head showed a 4 cm intraparenchymal hemorrhage involving the right temporal and parietal region with surrounding edema and mass effect. No clinical history of stroke. 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    Assessment / Plan / Recommendation Clinical Impression  Patient presents with a mild cognitive impairement impacting attention, short term recall of information, and complex problem solving, all of which per daughter, are different from baseline. Articulation differences noted impacting speech intelligibility however are baseline for this patient. Additionally, suspect some degree of left sided neglect vs visual impairment which improved with cueing although patient reported pain in both eyes and neck when attempting to direct attention to the left impacting diagnosis. Patient would benefit from SLP f/u to address deficits  above, maximizing safety and efficiency with ADLs.     SLP Assessment  Patient needs continued Speech Lanaguage Pathology Services    Follow Up Recommendations  Inpatient Rehab    Frequency and Duration min 2x/week  2 weeks   Pertinent Vitals/Pain none   SLP Goals  SLP Goals Potential to Achieve Goals: Good Progress/Goals/Alternative treatment plan discussed with pt/caregiver and they: Agree (see care plan)  SLP Evaluation Prior Functioning  Cognitive/Linguistic Baseline: Within functional limits Vocation: Retired   IT consultant  Overall Cognitive Status: Impaired/Different from baseline Arousal/Alertness: Awake/alert Orientation Level: Oriented X4 Attention: Sustained (? left sided neglect) Sustained Attention: Impaired Sustained Attention Impairment: Verbal complex;Functional complex (min cueing provided) Memory: Impaired Memory Impairment: Storage deficit Awareness: Impaired Awareness Impairment: Intellectual impairment Problem Solving: Impaired Problem Solving Impairment: Verbal complex;Functional complex Safety/Judgment: Appears intact    Comprehension  Auditory Comprehension Overall Auditory Comprehension: Appears within functional limits for tasks assessed Reading Comprehension Reading Status: Not tested    Expression Expression Primary Mode of Expression: Verbal Verbal Expression Overall Verbal Expression: Appears within functional limits for tasks assessed (baseline articulation differences)   Oral / Motor Oral Motor/Sensory Function Overall Oral Motor/Sensory Function: Impaired Labial ROM: Within Functional Limits Labial Symmetry: Abnormal symmetry left Labial Strength: Within Functional Limits Labial Sensation: Within Functional Limits Lingual ROM: Within Functional Limits Lingual Symmetry: Within Functional Limits Lingual Strength: Within Functional Limits Lingual Sensation: Within Functional Limits Facial ROM: Within Functional Limits Facial  Symmetry: Within Functional Limits Facial Strength: Within Functional Limits Facial Sensation: Within Functional Limits Velum: Within Functional Limits Mandible: Within Functional Limits Motor Speech Overall Motor Speech: Appears within functional limits for tasks assessed   GO   Eric Sargeant  MA, CCC-SLP [phone]   Eric [name] 07/03/2013, 12:35 PM

## 2013-07-03 NOTE — H&P (Signed)
Admission H&P    Chief Complaint: New-onset headache, slurred speech and left hemiparesis.  HPI: Eric [name] is an 72 y.o. male history COPD, hypertension and BPH, presenting with new onset slurred speech, left facial weakness and left hemiparesthesias as well as headache. CT scan of his head showed a 4 cm intraparenchymal hemorrhage involving the right temporal and parietal region with surrounding edema and mass effect. No clinical history of stroke. 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 He's been taking aspirin 81 mg per day for antiplatelet therapy. NIH stroke score was 14. He was last known well at bedtime at 12:30 a.m. on 07/02/2013.  LSN: 12:30 a.m. 07/02/2013 tPA Given: No: ICH MRankin: 4   Past Medical History  Diagnosis Date  . COPD (chronic obstructive pulmonary disease)   . Hypertension   . Benign prostatic hypertrophy   . ED (erectile dysfunction)   . Gynecomastia, male     bilateral breast removal    Past Surgical History  Procedure Laterality Date  . Bilateral breast removal    . Colonoscopy  04/24/05    Family History  Problem Relation Age of Onset  . Diabetes Other   . Hypertension Other    Social History:  reports that he has quit smoking. He does not have any smokeless tobacco history on file. He reports that he does not drink alcohol or use illicit drugs.  Allergies: No Known Allergies   (Not in a hospital admission)  ROS: History obtained from spouse and child  General ROS: negative for - chills, fatigue, fever, night sweats, weight gain or weight loss Psychological ROS: negative for - behavioral disorder, hallucinations, memory difficulties, mood swings or suicidal ideation Ophthalmic ROS: negative for - blurry vision, double vision, eye pain or loss of vision ENT ROS: negative for - epistaxis, nasal discharge, oral lesions, sore throat, tinnitus or vertigo Allergy and Immunology ROS: negative for - hives or itchy/watery eyes Hematological and  Lymphatic ROS: negative for - bleeding problems, bruising or swollen lymph nodes Endocrine ROS: negative for - galactorrhea, hair pattern changes, polydipsia/polyuria or temperature intolerance Respiratory ROS: negative for - cough, hemoptysis, shortness of breath or wheezing Cardiovascular ROS: negative for - chest pain, dyspnea on exertion, edema or irregular heartbeat Gastrointestinal ROS: negative for - abdominal pain, diarrhea, hematemesis, nausea/vomiting or stool incontinence Genito-Urinary ROS: negative for - dysuria, hematuria, incontinence or urinary frequency/urgency Musculoskeletal ROS: negative for - joint swelling or muscular weakness Neurological ROS: as noted in HPI Dermatological ROS: negative for rash and skin lesion changes  Physical Examination: Blood pressure 172/93, pulse 114, temperature 98.6 F (37 C), temperature source Oral, resp. rate 11, SpO2 97.00%.  HEENT-  Normocephalic, no lesions, without obvious abnormality.  Normal external eye and conjunctiva.  Normal TM's bilaterally.  Normal auditory canals and external ears. Normal external nose, mucus membranes and septum.  Normal pharynx. Neck supple with no masses, nodes, nodules or enlargement. Cardiovascular - regular rate and rhythm, S1, S2 normal, no murmur, click, rub or gallop Lungs - chest clear, no wheezing, rales, normal symmetric air entry, Heart exam - S1, S2 normal, no murmur, no gallop, rate regular Abdomen - soft, non-tender; bowel sounds normal; no masses,  no organomegaly, as his lower abdominal bruit noted. Extremities - no joint deformities, effusion, or inflammation and no edema  Neurologic Examination: Mental Status: Lethargic and, oriented x3.  Speech moderately slurred without evidence of aphasia. Able to follow commands with use of right extremities; neglect of left side. Cranial Nerves: II-dense left  homonymous hemianopsia. III/IV/VI-Pupils were equal and reacted. Eyes conjugately deviated to  the right side.    V/VII-left facial numbness and moderate left lower facial weakness. VIII-normal. X-moderately slurred speech. Motor: Moderately severe proximal and distal weakness of left upper and lower extremities; normal strength of right extremities. Muscle tone was increased in left extremities. Sensory: Mark sensory neglect of left side, including to tactile stimulation.and Deep Tendon Reflexes: 2+  on the right and 3+ on the left . Plantars:  mute on the right and extensor on the left  Cerebellar:  not tested and.  Results for orders placed during the hospital encounter of 07/03/13 (from the past 48 hour(s))  CBC WITH DIFFERENTIAL     Status: Abnormal   Collection Time    07/02/13 11:17 PM      Result Value Range   WBC 12.9 (*) 4.0 - 10.5 K/uL   RBC 5.02  4.22 - 5.81 MIL/uL   Hemoglobin 13.6  13.0 - 17.0 g/dL   HCT 16.1  09.6 - 04.5 %   MCV 79.1  78.0 - 100.0 fL   MCH 27.1  26.0 - 34.0 pg   MCHC 34.3  30.0 - 36.0 g/dL   RDW 40.9  81.1 - 91.4 %   Platelets 323  150 - 400 K/uL   Neutrophils Relative % 83 (*) 43 - 77 %   Neutro Abs 10.7 (*) 1.7 - 7.7 K/uL   Lymphocytes Relative 12  12 - 46 %   Lymphs Abs 1.5  0.7 - 4.0 K/uL   Monocytes Relative 6  3 - 12 %   Monocytes Absolute 0.7  0.1 - 1.0 K/uL   Eosinophils Relative 0  0 - 5 %   Eosinophils Absolute 0.0  0.0 - 0.7 K/uL   Basophils Relative 0  0 - 1 %   Basophils Absolute 0.0  0.0 - 0.1 K/uL  GLUCOSE, CAPILLARY     Status: Abnormal   Collection Time    07/03/13 12:25 AM      Result Value Range   Glucose-Capillary 63 (*) 70 - 99 mg/dL   Ct Head (brain) Wo Contrast  07/03/2013   CLINICAL DATA:  Headache in a aphasia  EXAM: CT HEAD WITHOUT CONTRAST  TECHNIQUE: Contiguous axial images were obtained from the base of the skull through the vertex without intravenous contrast.  COMPARISON:  None.  FINDINGS: Mild degradation due to patient motion.  Skull and Sinuses:No significant abnormality.  Orbits: No acute abnormality.   Brain: There is a 4 x 3 cm (maximal transaxial span) hematoma in the right temporal and parietal lobes, with a moderate amount of surrounding low-attenuation. Hemorrhage extends to the surface of the brain, without evidence of subdural or subarachnoid blood (high-density of the falx and tentorium is likely mineralization). Local mass effect effaces the right lateral ventricle and results in 5 mm of midline shift at the foramina [place].  The background brain is atrophic, in keeping with age. There been remote lenticulostriate infarctions bilaterally, with the larger on the right - which affects the putamen, caudate head, and intervening white matter tracts. No hydrocephalus.  Critical Value/emergent results were called by telephone at the time of interpretation on 07/03/2013 at 12:05 AM to [facility], who verbally acknowledged these results.  IMPRESSION: 1. 4 cm parenchymal hematoma in the right temporal parietal region, with moderate vasogenic edema. 5 mm leftward midline shift. 2. Remote bilateral lenticulostriate infarcts.   Electronically Signed   By: Tiburcio Pea M.D.   On:  07/03/2013 00:20    Assessment: 72 y.o. male  presenting with acute large right parietal temporal intracerebral hemorrhage with surrounding edema and mass effect.   Stroke Risk Factors - hypertension in the   Plan: 1. HgbA1c, fasting lipid panel 2. MRI, MRA  of the brain without contrast 3. PT consult, OT consult, Speech consult 4. Echocardiogram 5. Carotid dopplers 6. Prophylactic therapy-None 7. Repeat CT of the head to rule out ICH extension 8. Telemetry monitoring   C.R. Roseanne Reno, MD [facility]  [phone]   07/03/2013, 1:04 AM

## 2013-07-04 ENCOUNTER — Inpatient Hospital Stay (HOSPITAL_COMMUNITY): Payer: Medicare Other

## 2013-07-04 MED ORDER — TAMSULOSIN HCL 0.4 MG PO CAPS
0.4000 mg | ORAL_CAPSULE | Freq: Every day | ORAL | Status: DC
Start: 2013-07-04 — End: 2013-07-06
  Administered 2013-07-04 – 2013-07-06 (×3): 0.4 mg via ORAL
  Filled 2013-07-04 (×3): qty 1

## 2013-07-04 MED ORDER — LISINOPRIL 20 MG PO TABS
20.0000 mg | ORAL_TABLET | Freq: Every day | ORAL | Status: DC
  Administered 2013-07-04 – 2013-07-05 (×2): 20 mg via ORAL
  Filled 2013-07-04 (×3): qty 1

## 2013-07-04 MED ORDER — QUINAPRIL HCL 10 MG PO TABS: 20.0000 mg | ORAL_TABLET | Freq: Every day | ORAL | Status: DC

## 2013-07-04 MED ORDER — HYDROCODONE-ACETAMINOPHEN 5-325 MG PO TABS
1.0000 | ORAL_TABLET | ORAL | Status: DC | PRN
  Administered 2013-07-04 – 2013-07-05 (×3): 1 via ORAL
  Filled 2013-07-04 (×3): qty 1

## 2013-07-04 NOTE — Evaluation (Signed)
Physical Therapy Evaluation Patient Details Name: Eric [name] MRN: [id number] DOB: 1941-03-20 Today's Date: 07/04/2013 Time: 4098-1191 PT Time Calculation (min): 32 min  PT Assessment / Plan / Recommendation History of Present Illness  Pt found to have R temporal parietal hemorrhage presenting with L sided hemiparesis.  Clinical Impression  Pt with noted L sided weakness and L sided neglect/inattention. Pt also with delayed processing, sequencing deficits and impaired problem solving. Pt motivated and demo's excellent rehab potential. Pt to strongly benefit from CIR to achieve safe mod I function for safe transition home with spouse.    PT Assessment  Patient needs continued PT services    Follow Up Recommendations  CIR    Does the patient have the potential to tolerate intense rehabilitation      Barriers to Discharge        Equipment Recommendations   (TBD)    Recommendations for Other Services     Frequency Min 4X/week    Precautions / Restrictions Precautions Precautions: Fall Precaution Comments: L sided inattention Restrictions Weight Bearing Restrictions: No   Pertinent Vitals/Pain Denies pain. C/o lightheadedness during PT       Mobility  Bed Mobility Bed Mobility: Supine to Sit Supine to Sit: 3: Mod assist;With rails;HOB flat Details for Bed Mobility Assistance: assist for trunk elevation. pt dizzy upon sitting up EOB Transfers Transfers: Sit to Stand;Stand to Sit Sit to Stand: 3: Mod assist;With upper extremity assist;From bed Stand to Sit: 4: Min assist;With upper extremity assist;To chair/3-in-1 Details for Transfer Assistance: max directional v/c's for sequencing and hand placement. Pt with strong R lateral lean in standing requiring modA to regain midline and maintain balance Ambulation/Gait Ambulation/Gait Assistance: 3: Mod assist Ambulation Distance (Feet): 10 Feet (x2, to/from door, limited by dizziness) Assistive device: Rolling  walker Ambulation/Gait Assistance Details: pt with c/o dizziness. Attempted to amb without RW however pt extremely unsteady and unable to maintain midline without modA.Marland Kitchen Pt with increased stability with RW however pt remains to push to the R. modA to manage RW. Pt with narrowed base of support despite v/c's Gait Pattern: Step-through pattern;Decreased stride length;Shuffle Gait velocity: slow General Gait Details: limited by dizziness Stairs: No Wheelchair Mobility Wheelchair Mobility: No Modified Rankin (Stroke Patients Only) Pre-Morbid Rankin Score: No symptoms Modified Rankin: Moderately severe disability    Exercises     PT Diagnosis: Difficulty walking;Generalized weakness;Hemiplegia non-dominant side  PT Problem List: Decreased strength;Decreased activity tolerance;Decreased balance;Decreased mobility;Decreased coordination;Decreased cognition;Decreased safety awareness PT Treatment Interventions: DME instruction;Gait training;Stair training;Functional mobility training;Therapeutic activities;Therapeutic exercise     PT Goals(Current goals can be found in the care plan section) Acute Rehab PT Goals Patient Stated Goal: to get better PT Goal Formulation: With patient/family Time For Goal Achievement: 07/11/13 Potential to Achieve Goals: Good  Visit Information  Last PT Received On: 07/04/13 Assistance Needed: +1 History of Present Illness: Pt found to have R temporal parietal hemorrhage presenting with L sided hemiparesis.       Prior Functioning  Home Living Family/patient expects to be discharged to:: Inpatient rehab Living Arrangements: Spouse/significant other Prior Function Level of Independence: Independent Communication Communication: Expressive difficulties Dominant Hand: Right    Cognition  Cognition Arousal/Alertness: Awake/alert Behavior During Therapy: WFL for tasks assessed/performed Overall Cognitive Status: Impaired/Different from baseline Area of  Impairment: Attention;Memory;Following commands;Safety/judgement;Awareness;Problem solving Current Attention Level: Sustained Memory: Decreased short-term memory Following Commands: Follows one step commands consistently Safety/Judgement: Decreased awareness of safety;Decreased awareness of deficits Awareness: Emergent Problem Solving: Requires verbal cues;Requires tactile cues;Difficulty  sequencing    Extremity/Trunk Assessment Upper Extremity Assessment Upper Extremity Assessment: LUE deficits/detail LUE Deficits / Details: grossly 4-/5 Lower Extremity Assessment Lower Extremity Assessment: LLE deficits/detail LLE Deficits / Details: grossly 4-/5 Cervical / Trunk Assessment Cervical / Trunk Assessment: Normal   Balance Balance Balance Assessed: Yes Static Sitting Balance Static Sitting - Balance Support: Feet supported;Right upper extremity supported Static Sitting - Level of Assistance: 4: Min assist Static Sitting - Comment/# of Minutes: pt with strong R lateral lean. Pt requiring v/c's to self-correct. pt with impaired proprioception.Pt unable to maintain midline position > 10 seconds wihtout assist. Static Standing Balance Static Standing - Balance Support: Left upper extremity supported Static Standing - Level of Assistance: 3: Mod assist Static Standing - Comment/# of Minutes: strong R lateral lean. pt with LOB requiring modA to regain balance  End of Session PT - End of Session Equipment Utilized During Treatment: Gait belt Activity Tolerance:  (limited by dizziness) Patient left: in chair;with call bell/phone within reach;with family/visitor present Nurse Communication: Mobility status  GP     Marcene Brawn 07/04/2013, 4:05 PM  Lewis Shock, PT, DPT Pager #: [phone] Office #: [phone]

## 2013-07-04 NOTE — Progress Notes (Signed)
Stroke Team Progress Note  HISTORY Eric [name] is an 72 y.o. male history COPD, hypertension and BPH, presenting with new onset slurred speech, left facial weakness and left hemiparesthesias as well as headache. CT scan of his head showed a 4 cm intraparenchymal hemorrhage involving the right temporal and parietal region with surrounding edema and mass effect. No clinical history of stroke. 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 He's been taking aspirin 81 mg per day for antiplatelet therapy. NIH stroke score was 14. He was last known well at bedtime at 12:30 a.m. on 07/02/2013.  Patient was not a TPA candidate secondary to ICH. He was admitted to the neuro ICU for further evaluation and treatment.  SUBJECTIVE Complains of continued nausea, no vomiting. Complains of headache at a 8/10. Received 1 dose labetolol during the night for BP > 160. Lives with his wife. Daughter and granddaughter are at the bedside. Reports shaking left arm that lasted about 5 mins, more than one time. He will talk during the episodes.  OBJECTIVE Most recent Vital Signs: Filed Vitals:   07/04/13 0525 07/04/13 0600 07/04/13 0700 07/04/13 0714  BP: 171/74 144/81 174/100   Pulse: 65 74 58   Temp:    98.6 F (37 C)  TempSrc:    Oral  Resp: 19 19 15    Height:      Weight:      SpO2: 100% 100% 100%    CBG (last 3)   Recent Labs  07/03/13 0025 07/03/13 0126 07/03/13 0715  GLUCAP 63* 175* 101*    IV Fluid Intake:   . sodium chloride 75 mL/hr at 07/03/13 2109  . labetalol (NORMODYNE) infusion 0.5 mg/min (07/03/13 0129)    MEDICATIONS  . feeding supplement (RESOURCE BREEZE)  1 Container Oral Q24H  . pantoprazole (PROTONIX) IV  40 mg Intravenous QHS  . senna-docusate  1 tablet Oral BID   PRN:  acetaminophen, acetaminophen, labetalol, ondansetron (ZOFRAN) IV, oxyCODONE  Diet:  Cardiac  Thin liquids  Activity:  bedrest DVT Prophylaxis:  SCDs   CLINICALLY SIGNIFICANT STUDIES Basic Metabolic Panel:    Recent Labs Lab 07/03/13 0127  NA 137  K 4.3  CL 99  CO2 25  GLUCOSE 84  BUN 10  CREATININE 0.90  CALCIUM 9.3   Liver Function Tests:   Recent Labs Lab 07/03/13 0127  AST 15  ALT 10  ALKPHOS 70  BILITOT 0.4  PROT 7.2  ALBUMIN 3.4*   CBC:   Recent Labs Lab 07/02/13 2317  WBC 12.9*  NEUTROABS 10.7*  HGB 13.6  HCT 39.7  MCV 79.1  PLT 323   Coagulation: No results found for this basename: LABPROT, INR,  in the last 168 hours Cardiac Enzymes: No results found for this basename: CKTOTAL, CKMB, CKMBINDEX, TROPONINI,  in the last 168 hours Urinalysis: No results found for this basename: COLORURINE, APPERANCEUR, LABSPEC, PHURINE, GLUCOSEU, HGBUR, BILIRUBINUR, KETONESUR, PROTEINUR, UROBILINOGEN, NITRITE, LEUKOCYTESUR,  in the last 168 hours Lipid Panel    Component Value Date/Time   CHOL 171 06/21/2013 1038   TRIG 60.0 06/21/2013 1038   HDL 54.90 06/21/2013 1038   CHOLHDL 3 06/21/2013 1038   VLDL 12.0 06/21/2013 1038   LDLCALC 104* 06/21/2013 1038   HgbA1C  No results found for this basename: HGBA1C    Urine Drug Screen:   No results found for this basename: labopia,  cocainscrnur,  labbenz,  amphetmu,  thcu,  labbarb    Alcohol Level: No results found for this basename: ETH,  in the last 168 hours  CT of the brain   07/04/2013    Size stable parenchymal hematoma in the right temporal lobe. Stable vasogenic edema and local mass effect.    07/03/2013   1. 4 cm parenchymal hematoma in the right temporal parietal region, with moderate vasogenic edema. 5 mm leftward midline shift. 2. Remote bilateral lenticulostriate infarcts.  MRI of the brain  07/03/2013    26 x 33 x 66 mm (28 mL volume) right temporal lobe acute and subacute intracerebral hemorrhage with mild surrounding edema. Some T1 shortening suggests this may have begun 3-5 days ago. Slight right-to-left shift. Evidence for significant remote ischemia affecting bilateral basal ganglia with lacunar infarcts,  right greater than left.    MRA of the brain  07/03/2013     No underlying vascular malformation or visible vascular mass. No apparent posttraumatic sequelae. Small subcentimeter foci of restricted diffusion also noted in right hemisphere, probably not clinically significant.  This hematoma could have resulted from sequelae of cerebral amyloid angiography or an atypical manifestation of hypertensive cerebral vascular disease. Imaging findings do not strongly support one versus the other.  Intracranial atherosclerotic change as described with severe disease of the right greater than left MCA trifurcation branches.    2D Echocardiogram  EF 55-60% with no source of embolus.   Carotid Doppler  No evidence of hemodynamically significant internal carotid artery stenosis. Vertebral artery flow is antegrade.   CXR    EKG  normal sinus rhythm, no prior EKG to compare  Therapy Recommendations CIR  Physical Exam   HEENT- Normocephalic, no lesions, without obvious abnormality. Normal external eye and conjunctiva. Normal TM's bilaterally. Normal auditory canals and external ears. Normal external nose, mucus membranes and septum. Normal pharynx.  Neck supple with no masses, nodes, nodules or enlargement.  Cardiovascular - regular rate and rhythm, S1, S2 normal, no murmur, click, rub or gallop  Lungs - chest clear, no wheezing, rales, normal symmetric air entry, Heart exam - S1, S2 normal, no murmur, no gallop, rate regular  Abdomen - soft, non-tender; bowel sounds normal; no masses, no organomegaly, as his lower abdominal bruit noted.  Extremities - no joint deformities, effusion, or inflammation and no edema  Neurologic Examination:  Mental Status:  Lethargic and, oriented x3. Speech moderately slurred without evidence of aphasia. Able to follow commands with use of right extremities; neglect of left side.  Cranial Nerves:  II-dense left homonymous hemianopsia.  III/IV/VI-Pupils were equal and reacted.  Eyes midline V/VII-left facial numbness and moderate left lower facial weakness.  VIII-normal.  X-moderately slurred speech.  Motor: Moderately severe proximal and distal weakness of left upper and lower extremities improoved; normal strength of right extremities. Muscle tone was increased in left extremities.  Sensory: Mark sensory neglect of left side, including to tactile stimulation.and  Deep Tendon Reflexes: 2+ on the right and 3+ on the left .  Plantars: mute on the right and extensor on the left  Cerebellar: not tested and.   ASSESSMENT [name] is a 72 y.o. male presenting with headache, slurred speech and left hemiparesis. 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 Current hemorrhage not likely due to hypertension given location, however BP 184/100 on arrival; suspect amyloid, no underlying mass or other structural abnormality.   On aspirin 81 mg orally every day prior to admission. Patient with resultant neurologoc neglect, headache, nausea.  Headache secondary to hemorrhage, oxycodone prn Accelerated hypertension, received labetalol during the night Nausea secondary to hemorrhage COPD  BPH Left arm tremor. Doubt seizure. Pt awake and talking during the episode.  Hospital day # 1  TREATMENT/PLAN  Resume home accupril  OOB, therapy evals  Increase SBP goal to < 180  RN will alert [place] of repeat left arm seizures, any postictal symptoms  Transfer to the floor  Annie Main, MSN, RN, ANVP-BC, ANP-BC, GNP-BC [facility] Pager: [phone] 07/04/2013 8:26 AM  I have personally obtained a history, examined the patient, evaluated imaging results, and formulated the assessment and plan of care. I agree with the above.  Restart home antihypertensives. No sign of AVM on imaging likley amyloid bleed.  Pauletta Browns

## 2013-07-05 ENCOUNTER — Ambulatory Visit: Payer: Medicare Other | Admitting: Family Medicine

## 2013-07-05 MED ORDER — TRAMADOL HCL 50 MG PO TABS
100.0000 mg | ORAL_TABLET | Freq: Four times a day (QID) | ORAL | Status: DC | PRN
  Administered 2013-07-05 – 2013-07-06 (×3): 100 mg via ORAL
  Filled 2013-07-05 (×3): qty 2

## 2013-07-05 NOTE — Consult Note (Signed)
Physical Medicine and Rehabilitation Consult Reason for Consult: Right temporal parietal hemorrhage Referring Physician: Dr. Pearlean Brownie   HPI: Eric [name] is a 72 y.o. right-handed male with history of COPD as well as hypertension. Admitted 07/03/2013 with acute onset of headache, slurred speech and left-sided weakness. CT of the head showed a 4 cm intraparenchymal hemorrhage involving the right temporal and parietal region with surrounding edema and mass effect. Patient was not a candidate for TPA secondary ICH. MRI of the brain followup shows a 26 x 33 x 66 mm right temporal lobe acute and subacute intraparenchymal hemorrhage. Echocardiogram with ejection fraction of 60% without emboli. Carotid Dopplers with no ICA stenosis. Close monitoring of accelerated hypertension. Physical therapy evaluation completed 07/04/2013 noted left-sided neglect and inattention with recommendations for physical medicine rehabilitation consult to consider inpatient rehabilitation services   Review of Systems  Genitourinary: Positive for urgency.  Neurological: Positive for dizziness and headaches.  All other systems reviewed and are negative.   Past Medical History  Diagnosis Date  . COPD (chronic obstructive pulmonary disease)   . Hypertension   . Benign prostatic hypertrophy   . ED (erectile dysfunction)   . Gynecomastia, male     bilateral breast removal   Past Surgical History  Procedure Laterality Date  . Bilateral breast removal    . Colonoscopy  04/24/05   Family History  Problem Relation Age of Onset  . Diabetes Other   . Hypertension Other    Social History:  reports that he has quit smoking. He does not have any smokeless tobacco history on file. He reports that he does not drink alcohol or use illicit drugs. Allergies: No Known Allergies Medications Prior to Admission  Medication Sig Dispense Refill  . aspirin 81 MG tablet Take 81 mg by mouth daily.        Marland Kitchen levofloxacin (LEVAQUIN) 250  MG tablet Take 1 tablet (250 mg total) by mouth daily.  10 tablet  0  . quinapril (ACCUPRIL) 20 MG tablet TAKE 1 TABLET EVERY DAY  100 tablet  3  . tamsulosin (FLOMAX) 0.4 MG CAPS capsule Take 1 capsule (0.4 mg total) by mouth daily.  90 capsule  3    Home: Home Living Family/patient expects to be discharged to:: Inpatient rehab Living Arrangements: Spouse/significant other  Functional History: Prior Function Vocation: Retired Functional Status:  Mobility: Bed Mobility Bed Mobility: Supine to Sit Supine to Sit: 4: Min guard;HOB elevated Transfers Transfers: Sit to Stand;Stand to Sit Sit to Stand: 4: Min assist;With upper extremity assist;From bed Stand to Sit: 4: Min assist;With upper extremity assist;With armrests;To chair/3-in-1 Ambulation/Gait Ambulation/Gait Assistance: 4: Min assist;3: Mod assist Ambulation Distance (Feet): 70 Feet Assistive device: Rolling walker Ambulation/Gait Assistance Details: Verbal cues to stay closer to walker.  Several times pt got his feet tangled due to very narrow base of support and required mod A to correct balance. Gait Pattern: Step-to pattern;Decreased stride length;Narrow base of support Gait velocity: slow General Gait Details: Pt with c/o dizziness and nausea. Stairs: No Naval architect Mobility: No  ADL:    Cognition: Cognition Overall Cognitive Status: Impaired/Different from baseline Arousal/Alertness: Awake/alert Orientation Level: Oriented X4 Attention: Sustained (? left sided neglect) Sustained Attention: Impaired Sustained Attention Impairment: Verbal complex;Functional complex (min cueing provided) Memory: Impaired Memory Impairment: Storage deficit Awareness: Impaired Awareness Impairment: Intellectual impairment Problem Solving: Impaired Problem Solving Impairment: Verbal complex;Functional complex Safety/Judgment: Appears intact Cognition Arousal/Alertness: Awake/alert Behavior During Therapy:  WFL for tasks assessed/performed Overall Cognitive Status: Impaired/Different  from baseline Area of Impairment: Attention;Memory;Following commands;Safety/judgement;Awareness;Problem solving Current Attention Level: Sustained Memory: Decreased short-term memory Following Commands: Follows one step commands consistently Safety/Judgement: Decreased awareness of safety;Decreased awareness of deficits Awareness: Emergent Problem Solving: Requires verbal cues;Requires tactile cues;Difficulty sequencing  Blood pressure 162/84, pulse 70, temperature 99 F (37.2 C), temperature source Oral, resp. rate 18, height 5\' 7"  (1.702 m), weight 68.4 kg (150 lb 12.7 oz), SpO2 98.00%. Physical Exam  Vitals reviewed. Constitutional: He is oriented to person, place, and time. He appears well-developed.  HENT:  Head: Normocephalic.  Eyes: EOM are normal.  Neck: Normal range of motion. Neck supple. No thyromegaly present.  Cardiovascular: Normal rate and regular rhythm.   Respiratory: Effort normal and breath sounds normal. No respiratory distress.  GI: Soft. Bowel sounds are normal. He exhibits no distension.  Neurological: He is alert and oriented to person, place, and time.  Mood is flat but appropriate. Patient follows commands. Mild left central 7. Speech slightly dysarthric. Decreased coordination and PD of LUE as well as LLE. Strength 5/5 RUE and RLE. LUE is grossly 4/5 prox to distal. LLE is 4- prox to 4 distally. Intact temp and pain sense on left.   Skin: Skin is warm and dry.  Psychiatric: He has a normal mood and affect. His behavior is normal. Judgment and thought content normal.    No results found for this or any previous visit (from the past 24 hour(s)). Ct Head Wo Contrast  07/04/2013   CLINICAL DATA:  Followup stroke  EXAM: CT HEAD WITHOUT CONTRAST  TECHNIQUE: Contiguous axial images were obtained from the base of the skull through the vertex without intravenous contrast.  COMPARISON:   07/02/2013  FINDINGS: No acute soft tissue, osseous, or orbital findings have occurred since prior. The mastoids, middle ears, and imaged paranasal sinuses remain clear of fluid.  No appreciable change in the size of a hematoma within the right temporal and parietal lobes. The maximal span is in the craniocaudal dimension, better assessed by recent MRI. Vasogenic edema around the hematoma is also similar, as is mass effect effacing the right lateral ventricle and mildly shifting the midline. No evidence of significant subarachnoid or intraventricular extension. No hydrocephalus or evidence of acute ischemia. 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  IMPRESSION: Size stable parenchymal hematoma in the right temporal lobe. Stable vasogenic edema and local mass effect.   Electronically Signed   By: Tiburcio Pea M.D.   On: 07/04/2013 06:19   Mr Maxine Glenn Head Wo Contrast  07/03/2013   CLINICAL DATA:  Headache. Intracerebral hemorrhage. History of hypertension.  EXAM: MRI HEAD WITHOUT CONTRAST  MRA HEAD WITHOUT CONTRAST  TECHNIQUE: Multiplanar, multiecho pulse sequences of the brain and surrounding structures were obtained without intravenous contrast. Angiographic images of the head were obtained using MRA technique without contrast.  COMPARISON:  CT head 07/02/2013.  FINDINGS: MRI HEAD FINDINGS  Inserted motion  26 x 33 x 66 mm right temporal acute and subacute intracerebral hemorrhage with mild to moderate surrounding edema. Total volume estimated roughly 28 mL. T1 shortening suggests the hemorrhage may have initially started 3-5 days ago. Only minor right-to-left shift of 1-2 mm. Slight mass effect on the brainstem without frank uncal herniation. No definite subdural or subarachnoid blood. No associated mass lesion, or other foci of acute or chronic intracerebral hemorrhage.  In addition to marked restricted diffusion in the immediate vicinity the hematoma, there is small (at least 2)  foci of restricted diffusion in the right  frontal cortex representing superimposed ischemia/infarction. No left hemisphere, brainstem, or posterior circulation infarction.  Moderate atrophy. Chronic microvascular ischemic change. Fairly prominent and remote right greater than left basal ganglia lacunar infarcts. No previous large vessel cortical infarct. Calvarium intact. No skull base or upper cervical lesions. Negative orbits, sinuses, and mastoids.  MRA HEAD FINDINGS  Increased caliber of the skull base, cavernous, and supraclinoid left ICA likely relates to supply of both anterior cerebral arteries on the left. There is moderate narrowing of the supra clinoid right ICA which terminates in the right middle cerebral artery. Severely diseased right MCA trifurcation vessels with flow-limiting high-grade stenoses of both visualized right MCA M2 branches. Some attenuation of distal right MCA flow may be related to mass effect.  No significant atheromatous change of the supraclinoid left internal carotid artery. Mild non stenotic cavernous irregularity is noted on the left. 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  No proximal MCA stenosis on the left. Moderately diseased left MCA trifurcation vessels with suspected flow-limiting stenoses of all visualized M2 MCA branches.  Basilar artery widely patent with both vertebrals contributing, right dominant. Mild irregularity both distal vertebrals.  No proximal PCA disease. No cerebellar branch occlusion.  No evidence for arteriovenous malformation or vascular anomaly which might contribute to the observed hematoma.  IMPRESSION: 26 x 33 x 66 mm (28 mL volume) right temporal lobe acute and subacute intracerebral hemorrhage with mild surrounding edema. Some T1 shortening suggests this may have begun 3-5 days ago. Slight right-to-left shift. No underlying vascular malformation or  visible vascular mass. No apparent posttraumatic sequelae. Small subcentimeter foci of restricted diffusion also noted in right hemisphere, probably not clinically significant.  This hematoma could have resulted from sequelae of cerebral amyloid angiography or an atypical manifestation of hypertensive cerebral vascular disease. Imaging findings do not strongly support one versus the other.  Evidence for significant remote ischemia affecting bilateral basal ganglia with lacunar infarcts, right greater than left.  Intracranial atherosclerotic change as described with severe disease of the right greater than left MCA trifurcation branches.   Electronically Signed   By: Davonna Belling M.D.   On: 07/03/2013 17:29   Mr Brain Wo Contrast  07/03/2013   CLINICAL DATA:  Headache. Intracerebral hemorrhage. History of hypertension.  EXAM: MRI HEAD WITHOUT CONTRAST  MRA HEAD WITHOUT CONTRAST  TECHNIQUE: Multiplanar, multiecho pulse sequences of the brain and surrounding structures were obtained without intravenous contrast. Angiographic images of the head were obtained using MRA technique without contrast.  COMPARISON:  CT head 07/02/2013.  FINDINGS: MRI HEAD FINDINGS  Inserted motion  26 x 33 x 66 mm right temporal acute and subacute intracerebral hemorrhage with mild to moderate surrounding edema. Total volume estimated roughly 28 mL. T1 shortening suggests the hemorrhage may have initially started 3-5 days ago. Only minor right-to-left shift of 1-2 mm. Slight mass effect on the brainstem without frank uncal herniation. No definite subdural or subarachnoid blood. No associated mass lesion, or other foci of acute or chronic intracerebral hemorrhage.  In addition to marked restricted diffusion in the immediate vicinity the hematoma, there is small (at least 2) foci of restricted diffusion in the right frontal cortex representing superimposed ischemia/infarction. No left hemisphere, brainstem, or posterior circulation  infarction.  Moderate atrophy. Chronic microvascular ischemic change. Fairly prominent and remote right greater than left basal ganglia lacunar infarcts. No previous large vessel cortical infarct. Calvarium intact. No skull  base or upper cervical lesions. Negative orbits, sinuses, and mastoids.  MRA HEAD FINDINGS  Increased caliber of the skull base, cavernous, and supraclinoid left ICA likely relates to supply of both anterior cerebral arteries on the left. There is moderate narrowing of the supra clinoid right ICA which terminates in the right middle cerebral artery. Severely diseased right MCA trifurcation vessels with flow-limiting high-grade stenoses of both visualized right MCA M2 branches. Some attenuation of distal right MCA flow may be related to mass effect.  No significant atheromatous change of the supraclinoid left internal carotid artery. Mild non stenotic cavernous irregularity is noted on the left. 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  No proximal MCA stenosis on the left. Moderately diseased left MCA trifurcation vessels with suspected flow-limiting stenoses of all visualized M2 MCA branches.  Basilar artery widely patent with both vertebrals contributing, right dominant. Mild irregularity both distal vertebrals.  No proximal PCA disease. No cerebellar branch occlusion.  No evidence for arteriovenous malformation or vascular anomaly which might contribute to the observed hematoma.  IMPRESSION: 26 x 33 x 66 mm (28 mL volume) right temporal lobe acute and subacute intracerebral hemorrhage with mild surrounding edema. Some T1 shortening suggests this may have begun 3-5 days ago. Slight right-to-left shift. No underlying vascular malformation or visible vascular mass. No apparent posttraumatic sequelae. Small subcentimeter foci of restricted diffusion also noted in right hemisphere, probably not  clinically significant.  This hematoma could have resulted from sequelae of cerebral amyloid angiography or an atypical manifestation of hypertensive cerebral vascular disease. Imaging findings do not strongly support one versus the other.  Evidence for significant remote ischemia affecting bilateral basal ganglia with lacunar infarcts, right greater than left.  Intracranial atherosclerotic change as described with severe disease of the right greater than left MCA trifurcation branches.   Electronically Signed   By: Davonna Belling M.D.   On: 07/03/2013 17:29    Assessment/Plan: Diagnosis: Right temporo-parietal ICH 1. Does the need for close, 24 hr/day medical supervision in concert with the patient's rehab needs make it unreasonable for this patient to be served in a less intensive setting? Yes 2. Co-Morbidities requiring supervision/potential complications: htn, copd 3. Due to bladder management, bowel management, safety, skin/wound care, disease management and medication administration, does the patient require 24 hr/day rehab nursing? Yes 4. Does the patient require coordinated care of a physician, rehab nurse, PT (1-2 hrs/day, 5 days/week) and OT (1-2 hrs/day, 5 days/week) to address physical and functional deficits in the context of the above medical diagnosis(es)? Yes and Potentially Addressing deficits in the following areas: balance, endurance, locomotion, strength, transferring, bowel/bladder control, bathing, dressing, feeding, grooming, toileting and psychosocial support 5. Can the patient actively participate in an intensive therapy program of at least 3 hrs of therapy per day at least 5 days per week? Yes 6. The potential for patient to make measurable gains while on inpatient rehab is good and fair 7. Anticipated functional outcomes upon discharge from inpatient rehab are mod I with PT, mod I with OT, ?/ na with SLP. 8. Estimated rehab length of stay to reach the above functional goals is:  one week 9. Does the patient have adequate social supports to accommodate these discharge functional goals? Yes 10. Anticipated D/C setting: Home 11. Anticipated post D/C treatments: HH therapy 12. Overall Rehab/Functional Prognosis: excellent  RECOMMENDATIONS: This patient's condition is appropriate for continued rehabilitative care in  the following setting: CIR Patient has agreed to participate in recommended program. Potentially Note that insurance prior authorization may be required for reimbursement for recommended care.  Comment: Pt prefers to go home. Has wife and son at home. Would like to see his performance with therapies this am. If we bring him to rehab, it would be a relatively brief admit.  Ranelle Oyster, MD, [facility] Physical Medicine & Rehabilitation     07/05/2013

## 2013-07-05 NOTE — Evaluation (Signed)
Occupational Therapy Evaluation Patient Details Name: Eric [name] MRN: [id number] DOB: 10-15-1940 Today's Date: 07/05/2013 Time: 8657-8469 OT Time Calculation (min): 21 min  OT Assessment / Plan / Recommendation History of present illness Pt found to have R temporal parietal hemorrhage presenting with L sided hemiparesis.   Clinical Impression   Pt presents s/p R temporal parietal hemorrhage & L UE neglect/inattention affecting his ability to independently perform ADL's and functional mobility. Pt was Independent prior to this dx and would benefit from acute OT followed by CIR for in-pt rehab to address cognitive deficits related to L inattention, decreased problem solving, delayed processing, sequencing & to assist w/ maximizing independence w/ ADL's & homemaking tasks.     OT Assessment  Patient needs continued OT Services    Follow Up Recommendations  CIR    Barriers to Discharge      Equipment Recommendations  None recommended by OT    Recommendations for Other Services Rehab consult  Frequency  Min 2X/week    Precautions / Restrictions Precautions Precautions: Fall Precaution Comments: L sided inattention Restrictions Weight Bearing Restrictions: No   Pertinent Vitals/Pain R Eye pain: Not rated, RN made aware and pt request meds.    ADL  Grooming: Performed;Wash/dry hands;Wash/dry face;Minimal assistance (VC's for use of L UE to assist) Where Assessed - Grooming: Supported sitting (Sitting in recliner) Upper Body Bathing: Simulated;Minimal assistance Where Assessed - Upper Body Bathing: Supported sitting Lower Body Bathing: Simulated;Minimal assistance Where Assessed - Lower Body Bathing: Supported sit to stand Upper Body Dressing: Simulated;Min guard Where Assessed - Upper Body Dressing: Supported sitting Lower Body Dressing: Simulated;Minimal assistance Where Assessed - Lower Body Dressing: Supported sit to Pharmacist, hospital: Simulated;Minimal assistance  (Pt transferred from chair-EOB ) Toilet Transfer Method: Sit to Barista: Bedside commode (Sat at EOB) Toileting - Clothing Manipulation and Hygiene: Simulated;Minimal assistance Where Assessed - Engineer, mining and Hygiene: Sit to stand from 3-in-1 or toilet Tub/Shower Transfer Method: Not assessed Transfers/Ambulation Related to ADLs: Min assist for transfers w/ RW noted. Pt w/ Left neglect, per son reports "It's improved from yesterday" ADL Comments: Pt/family were verbally educated in Role of OT. Pt participated in grooming tasks while seated and was noted to demonstrate L UE neglect/inattention. Per pt report, he is Left hand dominant.     OT Diagnosis: Generalized weakness;Cognitive deficits;Acute pain;Other (comment) (L UE neglect/inattention: )  OT Problem List: Decreased activity tolerance;Decreased strength;Impaired balance (sitting and/or standing);Impaired vision/perception;Decreased knowledge of precautions;Decreased knowledge of use of DME or AE;Decreased safety awareness;Decreased cognition;Impaired UE functional use;Decreased coordination;Pain OT Treatment Interventions: Self-care/ADL training;Neuromuscular education;DME and/or AE instruction;Patient/family education;Visual/perceptual remediation/compensation;Cognitive remediation/compensation;Therapeutic activities;Balance training;Therapeutic exercise   OT Goals(Current goals can be found in the care plan section) Acute Rehab OT Goals Patient Stated Goal: To go home OT Goal Formulation: With patient/family Time For Goal Achievement: 07/05/13 Potential to Achieve Goals: Good  Visit Information  Last OT Received On: 07/05/13 Assistance Needed: +1 History of Present Illness: Pt found to have R temporal parietal hemorrhage presenting with L sided hemiparesis.       Prior Functioning     Home Living Family/patient expects to be discharged to:: Inpatient rehab Living Arrangements:  Spouse/significant other Prior Function Level of Independence: Independent Communication Communication: Expressive difficulties Dominant Hand: Right   Vision/Perception Vision - History Patient Visual Report: Other (comment) (Pt reports R eye pain/headache, RN made aware) Vision - Assessment Vision Assessment: Vision not tested   Cognition  Cognition Arousal/Alertness: Awake/alert Behavior During Therapy:  WFL for tasks assessed/performed Overall Cognitive Status: Impaired/Different from baseline Area of Impairment: Attention;Memory;Following commands;Safety/judgement;Awareness;Problem solving Current Attention Level: Sustained Memory: Decreased short-term memory Following Commands: Follows one step commands consistently Safety/Judgement: Decreased awareness of safety;Decreased awareness of deficits Awareness: Emergent Problem Solving: Requires verbal cues;Requires tactile cues;Difficulty sequencing;Slow processing    Extremity/Trunk Assessment Upper Extremity Assessment Upper Extremity Assessment: LUE deficits/detail LUE Deficits / Details: grossly 4-/5 Lower Extremity Assessment Lower Extremity Assessment: Defer to PT evaluation;LLE deficits/detail LLE Deficits / Details: grossly 4-/5 Cervical / Trunk Assessment Cervical / Trunk Assessment: Normal    Mobility Bed Mobility  Supine to Sit: 4: Min guard;HOB elevated Sit to Supine: Min A LE assist Scoot to HOB: Min A, w/ VC's and TC's (HOB flat) Details for Bed Mobility Assistance: No physical assist needed.  Pt c/o feeling woozy sitting up. Transfers Transfers: Sit to Stand;Stand to Sit Sit to Stand: 4: Min assist;With upper extremity assist;From chair/3-in-1 Stand to Sit: 4: Min assist;With upper extremity assist;With armrests;To bed Details for Transfer Assistance: Verbal cues for safety, sequencing and hand placement.        Balance Balance Balance Assessed: Yes Static Sitting Balance Static Sitting - Balance Support:  No upper extremity supported;Feet supported Static Sitting - Level of Assistance: 5: Stand by assistance Static Sitting - Comment/# of Minutes: Pt able to maintain midline position in unsupported sitting Static Standing Balance Static Standing - Balance Support: Bilateral upper extremity supported;During functional activity Static Standing - Level of Assistance: 4: Min assist   End of Session OT - End of Session Equipment Utilized During Treatment: Rolling walker Activity Tolerance: Patient limited by fatigue Patient left: in bed;with call bell/phone within reach;with bed alarm set;with family/visitor present Nurse Communication: Patient requests pain meds  GO     Alm Bustard 07/05/2013, 10:51 AM

## 2013-07-05 NOTE — Progress Notes (Signed)
Rehab Admissions Coordinator Note:  Patient was screened by Trish Mage for appropriateness for an Inpatient Acute Rehab Consult.  Noted PT recommending CIR.  At this time, we are recommending Inpatient Rehab consult.  Trish Mage 07/05/2013, 8:39 AM  I can be reached at [phone].

## 2013-07-05 NOTE — Progress Notes (Signed)
Physical Therapy Treatment Patient Details Name: Eric [name] MRN: [id number] DOB: 11/25/1940 Today's Date: 07/05/2013 Time: 4098-1191 PT Time Calculation (min): 29 min  PT Assessment / Plan / Recommendation  History of Present Illness Pt found to have R temporal parietal hemorrhage presenting with L sided hemiparesis.   PT Comments   Pt making good progress.  Still c/o dizziness and nausea.  Follow Up Recommendations  CIR     Does the patient have the potential to tolerate intense rehabilitation     Barriers to Discharge        Equipment Recommendations  Rolling walker with 5" wheels    Recommendations for Other Services    Frequency Min 4X/week   Progress towards PT Goals Progress towards PT goals: Progressing toward goals  Plan Current plan remains appropriate    Precautions / Restrictions Precautions Precautions: Fall Precaution Comments: L sided inattention   Pertinent Vitals/Pain See flow sheet.    Mobility  Bed Mobility Supine to Sit: 4: Min guard;HOB elevated Details for Bed Mobility Assistance: No physical assist needed.  Pt c/o feeling woozy sitting up. Transfers Sit to Stand: 4: Min assist;With upper extremity assist;From bed Stand to Sit: 4: Min assist;With upper extremity assist;With armrests;To chair/3-in-1 Details for Transfer Assistance: Verbal cues for hand placement. Ambulation/Gait Ambulation/Gait Assistance: 4: Min assist;3: Mod assist Ambulation Distance (Feet): 70 Feet Assistive device: Rolling walker Ambulation/Gait Assistance Details: Verbal cues to stay closer to walker.  Several times pt got his feet tangled due to very narrow base of support and required mod A to correct balance. Gait Pattern: Step-to pattern;Decreased stride length;Narrow base of support Gait velocity: slow General Gait Details: Pt with c/o dizziness and nausea. Modified Rankin (Stroke Patients Only) Pre-Morbid Rankin Score: No symptoms Modified Rankin: Moderately  severe disability    Exercises     PT Diagnosis:    PT Problem List:   PT Treatment Interventions:     PT Goals (current goals can now be found in the care plan section)    Visit Information  Last PT Received On: 07/05/13 Assistance Needed: +1 History of Present Illness: Pt found to have R temporal parietal hemorrhage presenting with L sided hemiparesis.    Subjective Data      Cognition  Cognition Arousal/Alertness: Awake/alert Behavior During Therapy: WFL for tasks assessed/performed Overall Cognitive Status: Impaired/Different from baseline Current Attention Level: Sustained Memory: Decreased short-term memory Following Commands: Follows one step commands consistently Safety/Judgement: Decreased awareness of safety;Decreased awareness of deficits Awareness: Emergent Problem Solving: Requires verbal cues;Requires tactile cues;Difficulty sequencing    Balance  Static Sitting Balance Static Sitting - Balance Support: No upper extremity supported;Feet supported Static Sitting - Level of Assistance: 5: Stand by assistance Static Sitting - Comment/# of Minutes: Pt able to maintain midline. Static Standing Balance Static Standing - Balance Support: Bilateral upper extremity supported Static Standing - Level of Assistance: 4: Min assist  End of Session PT - End of Session Equipment Utilized During Treatment: Gait belt Activity Tolerance: Other (comment) (limited by nausea and dizziness) Patient left: in chair;with call bell/phone within reach;with family/visitor present Nurse Communication: Mobility status   GP     [facility] 07/05/2013, 9:36 AM  [facility] PT [phone]

## 2013-07-05 NOTE — Progress Notes (Signed)
Stroke Team Progress Note  HISTORY Eric [name] is an 72 y.o. male history COPD, hypertension and BPH, presenting with new onset slurred speech, left facial weakness and left hemiparesthesias as well as headache. CT scan of his head showed a 4 cm intraparenchymal hemorrhage involving the right temporal and parietal region with surrounding edema and mass effect. No clinical history of stroke. 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 He's been taking aspirin 81 mg per day for antiplatelet therapy. NIH stroke score was 14. He was last known well at bedtime at 12:30 a.m. on 07/02/2013.  Patient was not a TPA candidate secondary to ICH. He was admitted to the neuro ICU for further evaluation and treatment.  SUBJECTIVE Son at bedside. Pt up in geri-chair.  OBJECTIVE Most recent Vital Signs: Filed Vitals:   07/04/13 1730 07/04/13 2100 07/05/13 0500 07/05/13 0904  BP: 135/63 134/67 157/78 162/84  Pulse:  78 75 70  Temp:  99.2 F (37.3 C) 99 F (37.2 C)   TempSrc:      Resp:  18 18   Height:      Weight:      SpO2:  99% 98%    CBG (last 3)   Recent Labs  07/03/13 0025 07/03/13 0126 07/03/13 0715  GLUCAP 63* 175* 101*    IV Fluid Intake:   . sodium chloride 75 mL/hr at 07/05/13 0040    MEDICATIONS  . feeding supplement (RESOURCE BREEZE)  1 Container Oral Q24H  . lisinopril  20 mg Oral QHS  . senna-docusate  1 tablet Oral BID  . tamsulosin  0.4 mg Oral Daily   PRN:  acetaminophen, acetaminophen, HYDROcodone-acetaminophen, labetalol, ondansetron (ZOFRAN) IV, oxyCODONE  Diet:  Cardiac  Thin liquids  Activity:  As tolerated DVT Prophylaxis:  SCDs   CLINICALLY SIGNIFICANT STUDIES Basic Metabolic Panel:   Recent Labs Lab 07/03/13 0127  NA 137  K 4.3  CL 99  CO2 25  GLUCOSE 84  BUN 10  CREATININE 0.90  CALCIUM 9.3   Liver Function Tests:   Recent Labs Lab 07/03/13 0127  AST 15  ALT 10  ALKPHOS 70  BILITOT 0.4  PROT 7.2  ALBUMIN 3.4*   CBC:   Recent  Labs Lab 07/02/13 2317  WBC 12.9*  NEUTROABS 10.7*  HGB 13.6  HCT 39.7  MCV 79.1  PLT 323   Coagulation: No results found for this basename: LABPROT, INR,  in the last 168 hours Cardiac Enzymes: No results found for this basename: CKTOTAL, CKMB, CKMBINDEX, TROPONINI,  in the last 168 hours Urinalysis: No results found for this basename: COLORURINE, APPERANCEUR, LABSPEC, PHURINE, GLUCOSEU, HGBUR, BILIRUBINUR, KETONESUR, PROTEINUR, UROBILINOGEN, NITRITE, LEUKOCYTESUR,  in the last 168 hours Lipid Panel    Component Value Date/Time   CHOL 171 06/21/2013 1038   TRIG 60.0 06/21/2013 1038   HDL 54.90 06/21/2013 1038   CHOLHDL 3 06/21/2013 1038   VLDL 12.0 06/21/2013 1038   LDLCALC 104* 06/21/2013 1038   HgbA1C  No results found for this basename: HGBA1C    Urine Drug Screen:   No results found for this basename: labopia,  cocainscrnur,  labbenz,  amphetmu,  thcu,  labbarb    Alcohol Level: No results found for this basename: ETH,  in the last 168 hours  CT of the brain   07/04/2013    Size stable parenchymal hematoma in the right temporal lobe. Stable vasogenic edema and local mass effect.    07/03/2013   1. 4 cm parenchymal hematoma in  the right temporal parietal region, with moderate vasogenic edema. 5 mm leftward midline shift. 2. Remote bilateral lenticulostriate infarcts.  MRI of the brain  07/03/2013    26 x 33 x 66 mm (28 mL volume) right temporal lobe acute and subacute intracerebral hemorrhage with mild surrounding edema. Some T1 shortening suggests this may have begun 3-5 days ago. Slight right-to-left shift. Evidence for significant remote ischemia affecting bilateral basal ganglia with lacunar infarcts, right greater than left.    MRA of the brain  07/03/2013     No underlying vascular malformation or visible vascular mass. No apparent posttraumatic sequelae. Small subcentimeter foci of restricted diffusion also noted in right hemisphere, probably not clinically significant.   This hematoma could have resulted from sequelae of cerebral amyloid angiography or an atypical manifestation of hypertensive cerebral vascular disease. Imaging findings do not strongly support one versus the other.  Intracranial atherosclerotic change as described with severe disease of the right greater than left MCA trifurcation branches.    2D Echocardiogram  EF 55-60% with no source of embolus.   Carotid Doppler  No evidence of hemodynamically significant internal carotid artery stenosis. Vertebral artery flow is antegrade.   CXR    EKG  normal sinus rhythm, no prior EKG to compare  Therapy Recommendations CIR  Physical Exam   HEENT- Normocephalic, no lesions, without obvious abnormality. Normal external eye and conjunctiva. Normal TM's bilaterally. Normal auditory canals and external ears. Normal external nose, mucus membranes and septum. Normal pharynx.  Neck supple with no masses, nodes, nodules or enlargement.  Cardiovascular - regular rate and rhythm, S1, S2 normal, no murmur, click, rub or gallop  Lungs - chest clear, no wheezing, rales, normal symmetric air entry, Heart exam - S1, S2 normal, no murmur, no gallop, rate regular  Abdomen - soft, non-tender; bowel sounds normal; no masses, no organomegaly, as his lower abdominal bruit noted.  Extremities - no joint deformities, effusion, or inflammation and no edema  Neurologic Examination:  Mental Status:  Awake and alert and, oriented x3. Speech moderately slurred without evidence of aphasia. Able to follow commands with use of right extremities; neglect of left side.  Cranial Nerves:  II-dense left homonymous hemianopsia.  III/IV/VI-Pupils were equal and reacted. Eyes midline V/VII-left facial numbness and moderate left lower facial weakness.  VIII-normal.  X-moderately slurred speech.  Motor: Moderately severe proximal and distal weakness of left upper and lower extremities improoved; normal strength of right extremities.  Muscle tone was increased in left extremities.  Sensory: Mark sensory neglect of left side, including to tactile stimulation.and  Deep Tendon Reflexes: 2+ on the right and 3+ on the left .  Plantars: mute on the right and extensor on the left  Cerebellar: not tested and.   ASSESSMENT [name] is a 72 y.o. male presenting with headache, slurred speech and left hemiparesis. 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 Current hemorrhage either due to hypertension (non typical hypertensive  location, however BP 184/100 on arrival) vs amyloid angiopathy; no underlying mass or other structural abnormality.   On aspirin 81 mg orally every day prior to admission. Patient with resultant neurologoc neglect, left sensory deficit, headache, nausea, confusion, mild left hemiparesis.   Headache secondary to hemorrhage, oxycodone prn Accelerated hypertension, received labetalol twice yesterday, none during the night. On home dose of accupril Nausea secondary to hemorrhage vs oxycodone COPD  BPH Left  arm tremor. Doubt seizure. Pt awake and talking during the episode. ? Related to hemisensory deficit  Hospital day # 2  TREATMENT/PLAN  Continued SBP goal  < 180. Monitor BP.  Change oxycodone and lortab to ultram for pain control  No driving  CIR - medically ready for transfer when bed available  Annie Main, MSN, RN, ANVP-BC, ANP-BC, GNP-BC [facility] Pager: [phone] 07/05/2013 9:28 AM  I have personally obtained a history, examined the patient, evaluated imaging results, and formulated the assessment and plan of care. I agree with the above.  Delia Heady, MD

## 2013-07-05 NOTE — Progress Notes (Signed)
UR completed. Isidora Laham RN CCM Case Mgmt 

## 2013-07-06 MED ORDER — ACETAMINOPHEN 325 MG PO TABS: 650.0000 mg | ORAL_TABLET | ORAL | Status: DC | PRN

## 2013-07-06 MED ORDER — TRAMADOL HCL 50 MG PO TABS: 100.0000 mg | ORAL_TABLET | Freq: Four times a day (QID) | ORAL | Status: DC | PRN

## 2013-07-06 NOTE — Care Management Note (Signed)
    Page 1 of 2   07/06/2013     11:17:11 AM   CARE MANAGEMENT NOTE 07/06/2013  Patient:  Eric [name], Eric [name]   Account Number:  [id number]  Date Initiated:  07/06/2013  Documentation initiated by:  GRAVES-BIGELOW,Cybele Maule  Subjective/Objective Assessment:   Pt admitted for Right temporal parietal hemorrhage. Pt has family support. Plan was originally for CIR and pt has changed his mind and wants to go home.     Action/Plan:   CM did speak to pt/family and he will have assistance at home. Pt wants to use [facility] for services. CM did place call to MD for orders. Pt has DME RW,3n1. No dme at this time.   Anticipated DC Date:  07/06/2013   Anticipated DC Plan:  HOME W HOME HEALTH SERVICES      DC Planning Services  CM consult      [facility] Choice  HOME HEALTH   Choice offered to / List presented to:  C-1 Patient        HH arranged  HH-1 RN  HH-10 DISEASE MANAGEMENT  HH-2 PT  HH-3 OT  HH-5 SPEECH THERAPY      HH agency  [facility]   Status of service:  Completed, signed off Medicare Important Message given?   (If response is "NO", the following Medicare IM given date fields will be blank) Date Medicare IM given:   Date Additional Medicare IM given:    Discharge Disposition:  HOME W HOME HEALTH SERVICES  Per UR Regulation:  Reviewed for med. necessity/level of care/duration of stay  If discussed at Long Length of Stay Meetings, dates discussed:    Comments:  07-06-13 [address], RN, BSN [phone] CM will make referral with [facility] for services and SOC to begin within 24-48 hours post d/c.

## 2013-07-06 NOTE — Discharge Summary (Signed)
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    Other and unspecified hyperlipidemia  BMI: Body mass index is 23.61 kg/(m^2).  Past Medical History  Diagnosis Date  . COPD (chronic obstructive pulmonary disease)   . Hypertension   . Benign prostatic hypertrophy   . ED (erectile dysfunction)   . Gynecomastia, male     bilateral breast removal   Past Surgical History  Procedure Laterality Date  . Bilateral breast removal    . Colonoscopy  04/24/05      Medication List    STOP taking these medications       aspirin 81 MG tablet     levofloxacin 250 MG tablet  Commonly known as:  LEVAQUIN      TAKE these medications       acetaminophen 325 MG tablet  Commonly known as:  TYLENOL  Take 2 tablets (650 mg total) by mouth every 4 (four) hours as needed for mild pain.     quinapril 20 MG tablet  Commonly known as:  ACCUPRIL  TAKE 1 TABLET EVERY DAY     tamsulosin 0.4 MG Caps capsule  Commonly known as:  FLOMAX  Take 1 capsule (0.4 mg total) by mouth daily.     traMADol 50 MG tablet  Commonly known as:  ULTRAM  Take 2 tablets (100 mg total) by mouth every 6 (six) hours as needed for moderate pain or severe pain.        LABORATORY STUDIES CBC    Component Value Date/Time   WBC 12.9* 07/02/2013 2317   RBC 5.02  07/02/2013 2317   HGB 13.6 07/02/2013 2317   HCT 39.7 07/02/2013 2317   PLT 323 07/02/2013 2317   MCV 79.1 07/02/2013 2317   MCH 27.1 07/02/2013 2317   MCHC 34.3 07/02/2013 2317   RDW 15.3 07/02/2013 2317   LYMPHSABS 1.5 07/02/2013 2317   MONOABS 0.7 07/02/2013 2317   EOSABS 0.0 07/02/2013 2317   BASOSABS 0.0 07/02/2013 2317   CMP    Component Value Date/Time   NA 137 07/03/2013 0127   K 4.3 07/03/2013 0127   CL 99 07/03/2013 0127   CO2 25 07/03/2013 0127   GLUCOSE 84 07/03/2013 0127   BUN 10 07/03/2013 0127   CREATININE 0.90 07/03/2013 0127   CALCIUM 9.3 07/03/2013 0127   PROT 7.2 07/03/2013 0127   ALBUMIN 3.4* 07/03/2013 0127   AST 15 07/03/2013 0127   ALT 10 07/03/2013 0127   ALKPHOS 70 07/03/2013 0127   BILITOT 0.4 07/03/2013 0127   GFRNONAA 83* 07/03/2013 0127   GFRAA >90 07/03/2013 0127   COAGS No results found for this basename: INR,  PROTIME   Lipid Panel    Component Value Date/Time   CHOL 171 06/21/2013 1038   TRIG 60.0 06/21/2013 1038   HDL 54.90 06/21/2013 1038   CHOLHDL 3 06/21/2013  1038   VLDL 12.0 06/21/2013 1038   LDLCALC 104* 06/21/2013 1038   HgbA1C  No results found for this basename: HGBA1C   Cardiac Panel (last 3 results) No results found for this basename: CKTOTAL, CKMB, TROPONINI, RELINDX,  in the last 72 hours Urinalysis    Component Value Date/Time   COLORURINE yellow 06/01/2010 1023   APPEARANCEUR Clear 06/01/2010 1023   LABSPEC 1.015 06/01/2010 1023   PHURINE 7.0 06/01/2010 1023   HGBUR negative 06/01/2010 1023   BILIRUBINUR n 06/21/2013 1126   BILIRUBINUR negative 06/01/2010 1023   UROBILINOGEN 0.2 06/21/2013 1126   UROBILINOGEN 0.2 06/01/2010 1023   NITRITE n 06/21/2013 1126   NITRITE negative 06/01/2010 1023   LEUKOCYTESUR Negative 06/21/2013 1126   Urine Drug Screen  No results found for this basename: labopia,  cocainscrnur,  labbenz,  amphetmu,  thcu,  labbarb    Alcohol Level No results found for this basename: eth    SIGNIFICANT  DIAGNOSTIC STUDIES CT of the brain  07/04/2013 Size stable parenchymal hematoma in the right temporal lobe. Stable vasogenic edema and local mass effect.  07/03/2013 1. 4 cm parenchymal hematoma in the right temporal parietal region, with moderate vasogenic edema. 5 mm leftward midline shift. 2. Remote bilateral lenticulostriate infarcts.  MRI of the brain 07/03/2013 26 x 33 x 66 mm (28 mL volume) right temporal lobe acute and subacute intracerebral hemorrhage with mild surrounding edema. Some T1 shortening suggests this may have begun 3-5 days ago. Slight right-to-left shift. Evidence for significant remote ischemia affecting bilateral basal ganglia with lacunar infarcts, right greater than left.  MRA of the brain 07/03/2013 No underlying vascular malformation or visible vascular mass. No apparent posttraumatic sequelae. Small subcentimeter foci of restricted diffusion also noted in right hemisphere, probably not clinically significant. This hematoma could have resulted from sequelae of cerebral amyloid angiography or an atypical manifestation of hypertensive cerebral vascular disease. Imaging findings do not strongly support one versus the other. Intracranial atherosclerotic change as described with severe disease of the right greater than left MCA trifurcation branches.  2D Echocardiogram EF 55-60% with no source of embolus.  Carotid Doppler No evidence of hemodynamically significant internal carotid artery stenosis. Vertebral artery flow is antegrade.  EKG normal sinus rhythm, no prior EKG to compare     History of Present Illness   Eric [name] is an 72 y.o. male history COPD, hypertension and BPH, presenting with new onset slurred speech, left facial weakness and left hemiparesthesias as well as headache. CT scan of his head showed a 4 cm intraparenchymal hemorrhage involving the right temporal and parietal region with surrounding edema and mass effect. No clinical history of stroke. Family  indicates patient has had TIAs. He's been taking aspirin 81 mg per day for antiplatelet therapy. NIH stroke score was 14. He was last known well at bedtime at 12:30 a.m. on 07/02/2013. Patient was not a TPA candidate secondary to ICH. He was admitted to the neuro ICU for further evaluation and treatment.   Hospital Course The patient's hemorrhage remained stable during the first 24h of admission, the time frame with highest risk of rebleeding. Hemorrhage was felt to be secondary to accelerated hypertension (non typical hypertensive location) presenting with BP 184/100 vs amyloid angiopathy. MRI was negative for underlying vascular abnormality. BP is improved in hospital; he was resumed on home dose of accupril. With continued stability, He was transferred to the floor.  Pt with:   Headache secondary to hemorrhage, ultram for pain control  Nausea secondary to hemorrhage vs oxycodone. Resolved in hospital Left arm tremor. Doubt seizure. Pt awake and talking during the episode. Most likely related to hemisensory deficit   Patient with vascular risk factors of:   Hyperlipidemia, LDL 104, on no statin PTA, now on no statin, goal LDL < 100 (< 70 for diabetics). Do not routinely start statin in setting of acute hemorrhage. Recommend consideration at f/u.  Patient with continued stroke symptoms of neurologoc neglect, left sensory deficit, headache, nausea, confusion, mild left hemiparesis. Physical therapy, occupational therapy and speech therapy evaluated patient. They recommended CIR - however, with continued improvement, pt decided to go home with PT, OT, ST and RN.  Discharge Exam  Blood pressure 147/79, pulse 54, temperature 98.5 F (36.9 C), temperature source Oral, resp. rate 18, height 5\' 7"  (1.702 m), weight 68.4 kg (150 lb 12.7 oz), SpO2 100.00%. HEENT- Normocephalic, no lesions, without obvious abnormality. Normal external eye and conjunctiva. Normal TM's bilaterally. Normal auditory canals and  external ears. Normal external nose, mucus membranes and septum. Normal pharynx.  Neck supple with no masses, nodes, nodules or enlargement.  Cardiovascular - regular rate and rhythm, S1, S2 normal, no murmur, click, rub or gallop  Lungs - chest clear, no wheezing, rales, normal symmetric air entry, Heart exam - S1, S2 normal, no murmur, no gallop, rate regular  Abdomen - soft, non-tender; bowel sounds normal; no masses, no organomegaly, as his lower abdominal bruit noted.  Extremities - no joint deformities, effusion, or inflammation and no edema  Neurologic Examination:  Mental Status:  Awake and alert and, oriented x3. Speech moderately slurred without evidence of aphasia. Able to follow commands with use of right extremities; neglect of left side.  Cranial Nerves:  II-dense left homonymous hemianopsia.  III/IV/VI-Pupils were equal and reacted. Eyes midline  V/VII-left facial numbness and moderate left lower facial weakness.  VIII-normal.  X-moderately slurred speech.  Motor: Moderately severe proximal and distal weakness of left upper and lower extremities improoved; normal strength of right extremities. Muscle tone was increased in left extremities.  Sensory: Mark sensory neglect of left side, including to tactile stimulation.and  Deep Tendon Reflexes: 2+ on the right and 3+ on the left .  Plantars: mute on the right and extensor on the left  Cerebellar: not tested and.   Discharge Diet     heart healthy thin liquids  Discharge Plan    Disposition:  Home with family  No aspirin, aspirin-containing medications, or ibuprofen products at this time  Ongoing risk factor control by Primary Care Physician. Home health PT, OT, ST, RN with [facility] No driving Consider statin in follow up   Follow-up TODD,JEFFREY ALLEN, MD in 1 month.  Follow-up with Dr. Delia Heady, Stroke Clinic in 2 months.  45 minutes were spent preparing discharge.  Signed Annie Main, AVNP,  ANP-BC, [facility] Nurse Practitioner 07/07/2013, 8:43 PM  I have personally examined this patient, reviewed pertinent data and developed the plan of care. I agree with above. Delia Heady, MD

## 2013-07-06 NOTE — Progress Notes (Signed)
Physical Therapy Treatment Patient Details Name: Eric [name] MRN: [id number] DOB: 04-03-1941 Today's Date: 07/06/2013 Time: 7829-5621 PT Time Calculation (min): 17 min  PT Assessment / Plan / Recommendation  History of Present Illness Pt found to have R temporal parietal hemorrhage presenting with L sided hemiparesis.   PT Comments   Pt con't to have L sided neglect/inattention and requires assist for all transfers/ambulation. Pt to con't to benefit from CIR however pt and family reports they are taking pt home today. Educated family that someone has to be with patient 24/7 due to L sided neglect and balance impairment. Encouraged family to assist pt OOB daily and not let pt stay in bed all day. Family reports 24/7 assist to be provided. If pt does go home pt will need HHPT. Family reports they have a RW for pt to use as well.   Follow Up Recommendations  CIR     Does the patient have the potential to tolerate intense rehabilitation     Barriers to Discharge        Equipment Recommendations  Rolling walker with 5" wheels    Recommendations for Other Services    Frequency Min 4X/week   Progress towards PT Goals Progress towards PT goals: Progressing toward goals  Plan Current plan remains appropriate    Precautions / Restrictions Precautions Precautions: Fall Precaution Comments: L sided inattention Restrictions Weight Bearing Restrictions: No   Pertinent Vitals/Pain C/o headache/dizziness    Mobility  Bed Mobility Bed Mobility: Supine to Sit;Sit to Supine Supine to Sit: 4: Min guard;HOB elevated Sit to Supine: 4: Min guard;HOB flat Details for Bed Mobility Assistance: v/c's for safety Transfers Transfers: Sit to Stand;Stand to Sit Sit to Stand: 4: Min guard;With upper extremity assist;From bed Stand to Sit: 4: Min guard;With upper extremity assist;To bed Details for Transfer Assistance: v/c's for safe hand placement and safety in  general Ambulation/Gait Ambulation/Gait Assistance: 4: Min assist Ambulation Distance (Feet): 120 Feet Assistive device: Rolling walker Ambulation/Gait Assistance Details: pt remains to have L sided neglect requiring v/c's for safe walker management. max v/c's to make L turns and to look at signs on L side. Pt remains to have c/o headache/dizziness upon standing and duration of ambulation Gait Pattern: Step-through pattern;Decreased stride length;Narrow base of support Gait velocity: slow General Gait Details: v/c's to limit pushing walker to far in front of pt Stairs: No    Exercises     PT Diagnosis:    PT Problem List:   PT Treatment Interventions:     PT Goals (current goals can now be found in the care plan section)    Visit Information  Last PT Received On: 07/06/13 Assistance Needed: +1 History of Present Illness: Pt found to have R temporal parietal hemorrhage presenting with L sided hemiparesis.    Subjective Data  Subjective: "I"m going home today"   Cognition  Cognition Arousal/Alertness: Awake/alert Behavior During Therapy: WFL for tasks assessed/performed Overall Cognitive Status: Impaired/Different from baseline Area of Impairment: Attention;Following commands;Safety/judgement;Awareness Current Attention Level: Selective Memory: Decreased short-term memory Following Commands: Follows one step commands consistently Safety/Judgement: Decreased awareness of safety;Decreased awareness of deficits Awareness: Emergent (L sided inattention) Problem Solving: Requires verbal cues;Requires tactile cues;Difficulty sequencing;Slow processing    Balance  Static Sitting Balance Static Sitting - Balance Support: No upper extremity supported;Feet supported Static Sitting - Level of Assistance: 5: Stand by assistance  End of Session PT - End of Session Equipment Utilized During Treatment: Gait belt Activity Tolerance:  (limited by  dizzines) Patient left: in bed;with call  bell/phone within reach;with family/visitor present Nurse Communication: Mobility status   GP     Marcene Brawn 07/06/2013, 4:00 PM  Lewis Shock, PT, DPT Pager #: [phone] Office #: [phone]

## 2013-07-06 NOTE — Progress Notes (Signed)
Stroke Team Progress Note  HISTORY Eric [name] is an 72 y.o. male history COPD, hypertension and BPH, presenting with new onset slurred speech, left facial weakness and left hemiparesthesias as well as headache. CT scan of his head showed a 4 cm intraparenchymal hemorrhage involving the right temporal and parietal region with surrounding edema and mass effect. No clinical history of stroke. 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 He's been taking aspirin 81 mg per day for antiplatelet therapy. NIH stroke score was 14. He was last known well at bedtime at 12:30 a.m. on 07/02/2013.  Patient was not a TPA candidate secondary to ICH. He was admitted to the neuro ICU for further evaluation and treatment.  SUBJECTIVE Son at bedside. Pt lying in bed.  OBJECTIVE Most recent Vital Signs: Filed Vitals:   07/05/13 1600 07/05/13 2000 07/06/13 0000 07/06/13 0357  BP: 162/78 149/89 149/63 146/75  Pulse: 64 63 61 60  Temp: 98.9 F (37.2 C) 98.4 F (36.9 C) 98.9 F (37.2 C) 98.8 F (37.1 C)  TempSrc: Oral Oral Oral Oral  Resp:   18 18  Height:      Weight:      SpO2: 99% 99% 98% 99%   CBG (last 3)  No results found for this basename: GLUCAP,  in the last 72 hours  IV Fluid Intake:   . sodium chloride 75 mL/hr at 07/05/13 0040    MEDICATIONS  . feeding supplement (RESOURCE BREEZE)  1 Container Oral Q24H  . lisinopril  20 mg Oral QHS  . senna-docusate  1 tablet Oral BID  . tamsulosin  0.4 mg Oral Daily   PRN:  acetaminophen, acetaminophen, labetalol, ondansetron (ZOFRAN) IV, traMADol  Diet:  Cardiac  Thin liquids  Activity:  As tolerated DVT Prophylaxis:  SCDs   CLINICALLY SIGNIFICANT STUDIES Basic Metabolic Panel:   Recent Labs Lab 07/03/13 0127  NA 137  K 4.3  CL 99  CO2 25  GLUCOSE 84  BUN 10  CREATININE 0.90  CALCIUM 9.3   Liver Function Tests:   Recent Labs Lab 07/03/13 0127  AST 15  ALT 10  ALKPHOS 70  BILITOT 0.4  PROT 7.2  ALBUMIN 3.4*   CBC:    Recent Labs Lab 07/02/13 2317  WBC 12.9*  NEUTROABS 10.7*  HGB 13.6  HCT 39.7  MCV 79.1  PLT 323   Coagulation: No results found for this basename: LABPROT, INR,  in the last 168 hours Cardiac Enzymes: No results found for this basename: CKTOTAL, CKMB, CKMBINDEX, TROPONINI,  in the last 168 hours Urinalysis: No results found for this basename: COLORURINE, APPERANCEUR, LABSPEC, PHURINE, GLUCOSEU, HGBUR, BILIRUBINUR, KETONESUR, PROTEINUR, UROBILINOGEN, NITRITE, LEUKOCYTESUR,  in the last 168 hours Lipid Panel    Component Value Date/Time   CHOL 171 06/21/2013 1038   TRIG 60.0 06/21/2013 1038   HDL 54.90 06/21/2013 1038   CHOLHDL 3 06/21/2013 1038   VLDL 12.0 06/21/2013 1038   LDLCALC 104* 06/21/2013 1038   HgbA1C  No results found for this basename: HGBA1C    Urine Drug Screen:   No results found for this basename: labopia,  cocainscrnur,  labbenz,  amphetmu,  thcu,  labbarb    Alcohol Level: No results found for this basename: ETH,  in the last 168 hours  CT of the brain   07/04/2013    Size stable parenchymal hematoma in the right temporal lobe. Stable vasogenic edema and local mass effect.    07/03/2013   1. 4 cm parenchymal hematoma  in the right temporal parietal region, with moderate vasogenic edema. 5 mm leftward midline shift. 2. Remote bilateral lenticulostriate infarcts.  MRI of the brain  07/03/2013    26 x 33 x 66 mm (28 mL volume) right temporal lobe acute and subacute intracerebral hemorrhage with mild surrounding edema. Some T1 shortening suggests this may have begun 3-5 days ago. Slight right-to-left shift. Evidence for significant remote ischemia affecting bilateral basal ganglia with lacunar infarcts, right greater than left.    MRA of the brain  07/03/2013     No underlying vascular malformation or visible vascular mass. No apparent posttraumatic sequelae. Small subcentimeter foci of restricted diffusion also noted in right hemisphere, probably not clinically  significant.  This hematoma could have resulted from sequelae of cerebral amyloid angiography or an atypical manifestation of hypertensive cerebral vascular disease. Imaging findings do not strongly support one versus the other.  Intracranial atherosclerotic change as described with severe disease of the right greater than left MCA trifurcation branches.    2D Echocardiogram  EF 55-60% with no source of embolus.   Carotid Doppler  No evidence of hemodynamically significant internal carotid artery stenosis. Vertebral artery flow is antegrade.   CXR    EKG  normal sinus rhythm, no prior EKG to compare  Therapy Recommendations CIR  Physical Exam   HEENT- Normocephalic, no lesions, without obvious abnormality. Normal external eye and conjunctiva. Normal TM's bilaterally. Normal auditory canals and external ears. Normal external nose, mucus membranes and septum. Normal pharynx.  Neck supple with no masses, nodes, nodules or enlargement.  Cardiovascular - regular rate and rhythm, S1, S2 normal, no murmur, click, rub or gallop  Lungs - chest clear, no wheezing, rales, normal symmetric air entry, Heart exam - S1, S2 normal, no murmur, no gallop, rate regular  Abdomen - soft, non-tender; bowel sounds normal; no masses, no organomegaly, as his lower abdominal bruit noted.  Extremities - no joint deformities, effusion, or inflammation and no edema  Neurologic Examination:  Mental Status:  Awake and alert and, oriented x3. Speech moderately slurred without evidence of aphasia. Able to follow commands with use of right extremities; neglect of left side.  Cranial Nerves:  II-dense left homonymous hemianopsia.  III/IV/VI-Pupils were equal and reacted. Eyes midline V/VII-left facial numbness and moderate left lower facial weakness.  VIII-normal.  X-moderately slurred speech.  Motor: Moderately severe proximal and distal weakness of left upper and lower extremities improoved; normal strength of right  extremities. Muscle tone was increased in left extremities.  Sensory: Mark sensory neglect of left side, including to tactile stimulation.and  Deep Tendon Reflexes: 2+ on the right and 3+ on the left .  Plantars: mute on the right and extensor on the left  Cerebellar: not tested and.   ASSESSMENT [name] is a 72 y.o. male presenting with headache, slurred speech and left hemiparesis. 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 Current hemorrhage either due to hypertension (non typical hypertensive  location, however BP 184/100 on arrival) vs amyloid angiopathy; no underlying mass or other structural abnormality.   On aspirin 81 mg orally every day prior to admission. Patient with resultant neurologoc neglect, left sensory deficit, headache, nausea, confusion, mild left hemiparesis.   Headache secondary to hemorrhage, ultram for pain control Accelerated hypertension, stable. On home dose of accupril Nausea secondary to hemorrhage vs oxycodone COPD  BPH Left arm tremor. Doubt seizure.  Pt awake and talking during the episode. ? Related to hemisensory deficit Hyperlipidemia, LDL 104, on no statin PTA, now on no statin, goal LDL < 100 (< 70 for diabetics)   Hospital day # 3  TREATMENT/PLAN  No driving  Consider statin in follow up  D/c IVF  CIR - medically ready for transfer when bed available  Annie Main, MSN, RN, ANVP-BC, ANP-BC, GNP-BC [facility] Pager: [phone] 07/06/2013 9:16 AM  I have personally obtained a history, examined the patient, evaluated imaging results, and formulated the assessment and plan of care. I agree with the above. Delia Heady, MD

## 2013-07-06 NOTE — Progress Notes (Signed)
Speech Language Pathology Treatment: Cognitive-Linquistic  Patient Details Name: GREGG WINCHELL MRN: [id number] DOB: February 20, 1941 Today's Date: 07/06/2013 Time: 1405-1430 SLP Time Calculation (min): 25 min  Assessment / Plan / Recommendation Clinical Impression  Skilled treatment session focused on addressing attention, working memory and problem solving skills.  SLP facilitated session with new learning task which patient selected attention to for ~65mins with Min cues for re-direction. Patient also required Mod question cues to recall task rule, scan to the left and accurately solve basic math calculations while completing this new task.  SLP also educated patient and family regarding activities they can do with patient to facilitate carryover as well as follow up SLP recommendations.       HPI HPI: TRIGO WINTERBOTTOM is an 72 y.o. male history COPD, hypertension and BPH, presenting with new onset slurred speech, left facial weakness and left hemiparesthesias as well as headache. CT scan of his head showed a 4 cm intraparenchymal hemorrhage involving the right temporal and parietal region with surrounding edema and mass effect. No clinical history of stroke. 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    Pertinent Vitals none  SLP Plan  Continue with current plan of care    Recommendations Follow up Recommendations: Inpatient Rehab;Outpatient SLP Plan: Continue with current plan of care                  GO    Charlane Ferretti., CCC-SLP [phone]  Trimaine Maser 07/06/2013, 3:14 PM

## 2013-07-07 DIAGNOSIS — E785 Hyperlipidemia, unspecified: Secondary | ICD-10-CM | POA: Diagnosis present

## 2013-07-09 ENCOUNTER — Telehealth: Payer: Self-pay | Admitting: Neurology

## 2013-07-12 ENCOUNTER — Other Ambulatory Visit: Payer: Self-pay | Admitting: Neurology

## 2013-07-12 MED ORDER — TRAMADOL HCL 50 MG PO TABS: 100.0000 mg | ORAL_TABLET | Freq: Four times a day (QID) | ORAL | Status: DC | PRN

## 2013-07-12 NOTE — Telephone Encounter (Signed)
Spoke with Eric [name] and she said that patient has been having pain since Friday(11/10), is currently taking Ultram -refill may be needed,appt has been sched 12/22, should patient be seen sooner?

## 2013-07-12 NOTE — Telephone Encounter (Signed)
I called and pt is taking tylenol and then tramadol as needed every 6 hours for headache and eye pain.  Does relieve then will come back after 2 hours.  Something stronger?  [facility] [address]  I faxed to [phone] confirmed.

## 2013-07-12 NOTE — Telephone Encounter (Signed)
I called daughter and pt in need of refill on tramadol or something stronger is also taking tylenol.  I relayed that Dr. Pearlean Brownie refilled and with refill times one.  Appt is 08-23-13 and if headaches do not get better then will need see sooner per Dr. Pearlean Brownie.  I relayed to [name] and she verbalized understanding.

## 2013-07-13 NOTE — Telephone Encounter (Signed)
Script has been signed and faxed to pharmacy.

## 2013-07-15 ENCOUNTER — Ambulatory Visit (INDEPENDENT_AMBULATORY_CARE_PROVIDER_SITE_OTHER): Payer: Medicare Other | Admitting: Family Medicine

## 2013-07-15 ENCOUNTER — Encounter: Payer: Self-pay | Admitting: Family Medicine

## 2013-07-15 VITALS — BP 180/100 | Temp 98.0°F | Wt 157.0 lb

## 2013-07-15 DIAGNOSIS — R51 Headache: Secondary | ICD-10-CM

## 2013-07-15 DIAGNOSIS — I10 Essential (primary) hypertension: Secondary | ICD-10-CM

## 2013-07-15 NOTE — Progress Notes (Signed)
Subjective:    Patient ID: Eric [name], male    DOB: 01/03/1941, 72 y.o.   MRN: [id number]  HPI Eric [name] is a 72 year old male married who comes in today accompanied by his daughters for evaluation following a hospitalization for an intracranial hemorrhage  He was hospitalized from November 1 to the fifth. He recently presented to the Saturday clinic with bilateral eye pain.  His current medications is Accupril 20 mg daily BP at home documented by many observers including home health care 140/90. BP today here 180/100.  He's also on Flomax 0.4 because of BPH.  He was given tramadol for the headache but it doesn't seem to help   Review of Systems    review of systems otherwise negative well cared for by his family Objective:   Physical Exam  Well-developed well-nourished male no acute distress vital signs stable except for BP 180/80 right arm sitting position      Assessment & Plan:  Hypertension with normal blood pressures at home,,,,,,, go by the home blood pressures continue current medication  Status post intracranial hemorrhage followup by neurology

## 2013-07-15 NOTE — Patient Instructions (Signed)
Continue your current medications ............. Tylenol 2 tabs 3 times daily as needed for headache, quinapril 20 mg daily for high blood pressure  Flomax 0.4,,,,,,,, 1 tab daily for BPH  Tramadol 50 mg 3 times daily. For headache  If the combination of Tylenol and tramadol this not control the headache then I would consult with his neurologist

## 2013-07-15 NOTE — Progress Notes (Signed)
Pre visit review using our clinic review tool, if applicable. No additional management support is needed unless otherwise documented below in the visit note. 

## 2013-07-20 ENCOUNTER — Encounter (HOSPITAL_COMMUNITY): Payer: Self-pay | Admitting: Emergency Medicine

## 2013-07-20 ENCOUNTER — Emergency Department (HOSPITAL_COMMUNITY): Payer: Medicare Other

## 2013-07-20 ENCOUNTER — Emergency Department (HOSPITAL_COMMUNITY)
Admission: EM | Admit: 2013-07-20 | Discharge: 2013-07-20 | Disposition: A | Discharge status: Home / Self Care | Payer: Medicare Other | Attending: Emergency Medicine | Admitting: Emergency Medicine

## 2013-07-20 DIAGNOSIS — Z87891 Personal history of nicotine dependence: Secondary | ICD-10-CM | POA: Insufficient documentation

## 2013-07-20 DIAGNOSIS — Z8673 Personal history of transient ischemic attack (TIA), and cerebral infarction without residual deficits: Secondary | ICD-10-CM | POA: Insufficient documentation

## 2013-07-20 DIAGNOSIS — Z87448 Personal history of other diseases of urinary system: Secondary | ICD-10-CM | POA: Insufficient documentation

## 2013-07-20 DIAGNOSIS — N400 Enlarged prostate without lower urinary tract symptoms: Secondary | ICD-10-CM | POA: Insufficient documentation

## 2013-07-20 DIAGNOSIS — M79609 Pain in unspecified limb: Secondary | ICD-10-CM | POA: Insufficient documentation

## 2013-07-20 DIAGNOSIS — Z79899 Other long term (current) drug therapy: Secondary | ICD-10-CM | POA: Insufficient documentation

## 2013-07-20 DIAGNOSIS — J449 Chronic obstructive pulmonary disease, unspecified: Secondary | ICD-10-CM | POA: Insufficient documentation

## 2013-07-20 DIAGNOSIS — R209 Unspecified disturbances of skin sensation: Secondary | ICD-10-CM | POA: Insufficient documentation

## 2013-07-20 DIAGNOSIS — R51 Headache: Secondary | ICD-10-CM | POA: Insufficient documentation

## 2013-07-20 DIAGNOSIS — J4489 Other specified chronic obstructive pulmonary disease: Secondary | ICD-10-CM | POA: Insufficient documentation

## 2013-07-20 DIAGNOSIS — Z792 Long term (current) use of antibiotics: Secondary | ICD-10-CM | POA: Insufficient documentation

## 2013-07-20 LAB — APTT: aPTT: 32 seconds (ref 24–37)

## 2013-07-20 LAB — URINALYSIS, ROUTINE W REFLEX MICROSCOPIC
Bilirubin Urine: NEGATIVE
Hgb urine dipstick: NEGATIVE
Ketones, ur: NEGATIVE mg/dL
Leukocytes, UA: NEGATIVE
Protein, ur: NEGATIVE mg/dL
Specific Gravity, Urine: 1.008 (ref 1.005–1.030)
Urobilinogen, UA: 1 mg/dL (ref 0.0–1.0)

## 2013-07-20 LAB — CBC WITH DIFFERENTIAL/PLATELET
Basophils Absolute: 0 10*3/uL (ref 0.0–0.1)
Basophils Relative: 0 % (ref 0–1)
Eosinophils Absolute: 0 10*3/uL (ref 0.0–0.7)
HCT: 39.1 % (ref 39.0–52.0)
Hemoglobin: 13.3 g/dL (ref 13.0–17.0)
MCH: 26.9 pg (ref 26.0–34.0)
MCHC: 34 g/dL (ref 30.0–36.0)
MCV: 79.1 fL (ref 78.0–100.0)
Monocytes Absolute: 0.4 10*3/uL (ref 0.1–1.0)
Monocytes Relative: 6 % (ref 3–12)
Platelets: 363 10*3/uL (ref 150–400)

## 2013-07-20 LAB — BASIC METABOLIC PANEL
BUN: 7 mg/dL (ref 6–23)
Calcium: 10.3 mg/dL (ref 8.4–10.5)
Chloride: 98 mEq/L (ref 96–112)
Creatinine, Ser: 0.73 mg/dL (ref 0.50–1.35)
GFR calc Af Amer: >90 mL/min (ref >90)
GFR calc non Af Amer: >90 mL/min (ref >90)

## 2013-07-20 LAB — PROTIME-INR
INR: 1.12 (ref 0.00–1.49)
Prothrombin Time: 14.2 seconds (ref 11.6–15.2)

## 2013-07-20 LAB — POCT I-STAT TROPONIN I

## 2013-07-20 MED ORDER — LABETALOL HCL 5 MG/ML IV SOLN
10.0000 mg | Freq: Once | INTRAVENOUS | Status: AC
  Administered 2013-07-20: 10 mg via INTRAVENOUS
  Filled 2013-07-20: qty 4

## 2013-07-20 MED ORDER — MORPHINE SULFATE 4 MG/ML IJ SOLN
4.0000 mg | Freq: Once | INTRAMUSCULAR | Status: AC
  Administered 2013-07-20: 4 mg via INTRAVENOUS
  Filled 2013-07-20: qty 1

## 2013-07-20 MED ORDER — OXYCODONE-ACETAMINOPHEN 5-325 MG PO TABS: ORAL_TABLET | ORAL | Status: DC

## 2013-07-20 MED ORDER — DEXAMETHASONE SODIUM PHOSPHATE 10 MG/ML IJ SOLN
10.0000 mg | Freq: Once | INTRAMUSCULAR | Status: AC
  Administered 2013-07-20: 10 mg via INTRAVENOUS
  Filled 2013-07-20: qty 1

## 2013-07-20 MED ORDER — HYDROCHLOROTHIAZIDE 12.5 MG PO TABS: 12.5000 mg | ORAL_TABLET | Freq: Every day | ORAL | Status: DC

## 2013-07-20 MED ORDER — NICARDIPINE HCL IN NACL 20-0.86 MG/200ML-% IV SOLN: 5.0000 mg/h | Freq: Once | INTRAVENOUS | Status: DC

## 2013-07-20 NOTE — ED Notes (Signed)
Pt returned from CT °

## 2013-07-20 NOTE — Consult Note (Signed)
Neurology Consultation Reason for Consult: Headache Referring Physician: Freida Busman, a  CC: Headache  History is obtained from: Patient, family member  HPI: Eric [name] is a 72 y.o. male with a history of a recent intraparenchymal hematoma who was discharged on 11/4. Since then, he feels that his difficulties including weakness and numbness have been steadily improving. He has continued, however, to have persistent headaches. He states that they were relatively well controlled with the pain medicine that he was given, however he ran out of this medication last night and therefore has been unable to sleep and presents today with worsening of headaches.  In the ED, he was seen to have a blood pressure of 225 systolic which responded well to labetalol. He was recently seen by his primary care physician who noted that he had been having normal blood pressures at home, and therefore did not make any changes to his BP management.  He currently is 150/90  ROS: A 14 point ROS was performed and is negative except as noted in the HPI.  Past Medical History  Diagnosis Date  . COPD (chronic obstructive pulmonary disease)   . Hypertension   . Benign prostatic hypertrophy   . ED (erectile dysfunction)   . Gynecomastia, male     bilateral breast removal    Family History: No history of memory problem  Social History: Tob: Negative  Exam: Current vital signs: BP 163/91  Pulse 87  Temp(Src) 98.3 F (36.8 C) (Oral)  Resp 18  Wt 73.936 kg (163 lb)  SpO2 97% Vital signs in last 24 hours: Temp:  [98.3 F (36.8 C)] 98.3 F (36.8 C) (11/18 1550) Pulse Rate:  [87-107] 87 (11/18 2130) Resp:  [17-18] 18 (11/18 1823) BP: (149-191)/(70-164) 163/91 mmHg (11/18 2130) SpO2:  [97 %-100 %] 97 % (11/18 2130) Weight:  [73.936 kg (163 lb)] 73.936 kg (163 lb) (11/18 1543)  General: In bed, NAD CV: Regular rate and rhythm Mental Status: Patient is awake, alert, oriented to person, place, month,  year, and situation. Immediate and remote memory are intact. Patient is able to give a clear and coherent history. No signs of aphasia, though he does have some visual neglect of the left side, though this is difficult to be certain of given that he has vision difficulty on that side as well Cranial Nerves: II: Visual Fields are decreased on the left, but he does see some in the left lower field. Pupils are equal, round, and reactive to light.  Discs are difficult to visualize. III,IV, VI: EOMI without ptosis or diploplia.  V: Facial sensation is decreased on left VII: Facial movement is mildly weak in the left face VIII: hearing is intact to voice X: Uvula elevates symmetrically XI: Shoulder shrug is symmetric. XII: tongue is midline without atrophy or fasciculations.  Motor: Tone is normal. Bulk is normal. 5/5 strength was present on the right, he has 4/5 strength in the left arm and leg Sensory: Sensation is decreased on the left Deep Tendon Reflexes: 2+ and symmetric in the biceps and patellae.  Plantars: Toes are downgoing bilaterally.  Cerebellar: FNF  intact bilaterally Gait: Patient has a stable casual gait.    I have reviewed labs in epic and the results pertinent to this consultation are: BMP-mild hyperkalemia  I have reviewed the images obtained: MRI brain-evolutionary changes of the previous hematoma. There is more edema than the previous MRI. There is also redemonstration of an infarct that was seen on the previous MRI  Impression:  72 year old male status post intraparenchymal hemorrhage with persistent headaches. I suspect that the edema that is seen represents evolution of his initial insult given that he had an MRI very early in his course previously. I discussed with him that he needs to focus on better blood pressure control. Be infarct that is seen, I also suspect was due to his initial insult. There were several areas of infarction on initial MRI, and I do wonder  if hemorrhagic conversion it is possible as an etiology of his hematoma.  Recommendations: 1) BP control per emergency department and PCP, which consider starting second agent 2) patient is to return to the emergency department for worsening headaches, worsening left-sided weakness or gait, uncontrolled hypertension 3) analgesia for headaches 4) followup with Dr. Pearlean Brownie has an outpatient  Ritta Slot, MD [facility] [phone]  If 7pm- 7am, please page neurology on call at [phone].

## 2013-07-20 NOTE — ED Provider Notes (Signed)
CSN: [id number]     Arrival date & time 07/20/13  1539 History   First MD Initiated Contact with Patient 07/20/13 1609     Chief Complaint  Patient presents with  . Hypertension   (Consider location/radiation/quality/duration/timing/severity/associated sxs/prior Treatment) HPI  Eric [name] is a 72 y.o. male with past medical history significant for COPD,  Hypertension, was recently admitted for right parietal hemorrhagic stroke with midline shift accompanied by his daughter complaining of right peri-orbital and occipital headache with elevated blood pressure patient also reports that he has bilateral upper extremity numbness and pain. Both of these symptoms have been worsening over the week. Daughter reports that she feels like his has become more unstable today. Patient denies any dysarthria, or ataxia. Daughter reports that he is speaking and acting normally. Patient was discharged from the hospital 1.5 weeks ago after admission for CVA. Patient denies any chest pain, shortness of breath, abdominal pain, nausea vomiting. Patient is not currently taking any anticoagulation including aspirin. Patient has been taking tramadol and acetaminophen for the headache with no relief. Patient has been compliant with his home hypertensive medications. Patient lives with his wife and his daughter visits frequently.  Past Medical History  Diagnosis Date  . COPD (chronic obstructive pulmonary disease)   . Hypertension   . Benign prostatic hypertrophy   . ED (erectile dysfunction)   . Gynecomastia, male     bilateral breast removal   Past Surgical History  Procedure Laterality Date  . Bilateral breast removal    . Colonoscopy  04/24/05   Family History  Problem Relation Age of Onset  . Diabetes Other   . Hypertension Other    History  Substance Use Topics  . Smoking status: Former Games developer  . Smokeless tobacco: Not on file  . Alcohol Use: No    Review of Systems 10 systems reviewed and  found to be negative, except as noted in the HPI  Allergies  Review of patient's allergies indicates no known allergies.  Home Medications   Current Outpatient Rx  Name  Route  Sig  Dispense  Refill  . acetaminophen (TYLENOL) 325 MG tablet   Oral   Take 2 tablets (650 mg total) by mouth every 4 (four) hours as needed for mild pain.   30 tablet   2   . quinapril (ACCUPRIL) 20 MG tablet   Oral   Take 20 mg by mouth daily.         . tamsulosin (FLOMAX) 0.4 MG CAPS capsule   Oral   Take 1 capsule (0.4 mg total) by mouth daily.   90 capsule   3   . traMADol (ULTRAM) 50 MG tablet   Oral   Take 2 tablets (100 mg total) by mouth every 6 (six) hours as needed for moderate pain or severe pain.   30 tablet   1   . hydrochlorothiazide (HYDRODIURIL) 12.5 MG tablet   Oral   Take 1 tablet (12.5 mg total) by mouth daily.   30 tablet   1   . levofloxacin (LEVAQUIN) 250 MG tablet   Oral   Take 250 mg by mouth daily.          Marland Kitchen oxyCODONE-acetaminophen (PERCOCET/ROXICET) 5-325 MG per tablet      1 to 2 tabs PO q6hrs  PRN for pain   20 tablet   0    BP 163/91  Pulse 87  Temp(Src) 98.3 F (36.8 C) (Oral)  Resp 18  Wt 163 lb (  73.936 kg)  SpO2 97% Physical Exam  Nursing note and vitals reviewed. Constitutional: He is oriented to person, place, and time. He appears well-developed and well-nourished. No distress.  HENT:  Head: Normocephalic and atraumatic.  Mouth/Throat: Oropharynx is clear and moist.  Eyes: Conjunctivae and EOM are normal. Pupils are equal, round, and reactive to light.  Cardiovascular: Normal rate, regular rhythm and intact distal pulses.   Pulmonary/Chest: Effort normal and breath sounds normal. No stridor. No respiratory distress. He has no wheezes. He has no rales. He exhibits no tenderness.  Abdominal: Soft. Bowel sounds are normal. He exhibits no distension and no mass. There is no tenderness. There is no guarding.  Musculoskeletal: Normal range of  motion.  Neurological: He is alert and oriented to person, place, and time.  Cranial nerves III through XII intact, strength 5 out of 5x4 extremities, negative pronator drift, finger to nose and heel-to-shin coordinated, sensation intact to pinprick and light touch, gait is coordinated and Romberg is negative.    Psychiatric: He has a normal mood and affect.    ED Course  Procedures (including critical care time) Labs Review Labs Reviewed  CBC WITH DIFFERENTIAL - Abnormal; Notable for the following:    Neutrophils Relative % 79 (*)    All other components within normal limits  BASIC METABOLIC PANEL - Abnormal; Notable for the following:    Sodium 134 (*)    Potassium 5.2 (*)    Glucose, Bld 107 (*)    All other components within normal limits  APTT  PROTIME-INR  URINALYSIS, ROUTINE W REFLEX MICROSCOPIC  POCT I-STAT TROPONIN I   Imaging Review Ct Head Wo Contrast  07/20/2013   CLINICAL DATA:  Headache  EXAM: CT HEAD WITHOUT CONTRAST  TECHNIQUE: Contiguous axial images were obtained from the base of the skull through the vertex without intravenous contrast.  COMPARISON:  07/04/2013  FINDINGS: Brainstem, cerebellum, and cerebral peduncles a normal.  Evolutionary changes in the right temporoparietal hematoma noted with surrounding white matter hypodensity characteristic of vasogenic edema. Current residual blood products demonstrate less marginal pulling and are less sharply defined, and overall smaller than on the prior exam, although there is still a hyperdense component centrally measuring about 2.7 cm transversely (formerly 3.4 cm). Associated hypodensity tracks in 2 the margin of the right thalamus, and there appears to be a remote lacunar infarct involving the anterior portion of the right lentiform nucleus, the anterior limb of the right internal capsule, and part of the head of the right caudate nucleus.  There is up to 8 mm of right-to-left midline shift, which is increased from the  prior exam where midline shift measured 5 mm. Effacement of the right lateral ventricle is increased, without significant trapping of the left lateral ventricle. Continued sulcal effacement on the right.  Remote lacunar infarct involving the left lentiform nucleus  IMPRESSION: 1. Evolutionary changes in the right temporoparietal hematoma, with increase in associated vasogenic edema and increase in right to left midline shift now at 8 mm as compared to 5 mm. There is still a significant hyperdense component of the hematoma, and some component of continued bleeding is not readily excluded given the persistence of hyperdensity over the last 16 days. 2. Increased effacement of the right lateral ventricle. 3. Remote bilateral basal ganglia lacunar infarcts.   Electronically Signed   By: Herbie Baltimore M.D.   On: 07/20/2013 17:37   Mr Brain Wo Contrast  07/20/2013   CLINICAL DATA:  History of brain hemorrhage.  Increasing headache  EXAM: MRI HEAD WITHOUT CONTRAST  TECHNIQUE: Multiplanar, multiecho pulse sequences of the brain and surrounding structures were obtained without intravenous contrast.  COMPARISON:  CT 07/20/2013, MRI 07/03/2013  FINDINGS: The patient was  not able to complete the study.  Large hematoma in the right temporoparietal lobe measures 6.1 x 3.8 cm. This has undergone evolutionary change with increased methemoglobin in the wall of the cavity and increased fluid centrally. There is increased white matter edema compared with the prior study. There is increase in mass effect and midline shift which now measures 6 mm. The patient was not able to tolerate a complete scan and no intravenous contrast was given. I do not see evidence of underlying tumor on today's scan or on the MRI from 07/03/2013.  Chronic lacunar infarction in the right basal ganglia is unchanged from the prior MRI. There is chronic ischemia in the pons.  Small area of restricted diffusion in the right medial parietal cortex  consistent with acute infarct. This was present on the prior MRI but is slightly larger on today's study.  Negative for hydrocephalus  IMPRESSION: 6.1 x 3.8 cm right temporoparietal hematoma has undergone typical evolutionary changes. There is however increase in vasogenic edema in the right temporal parietal lobe with increased midline shift compared with the prior study. No underlying mass or repeat hemorrhage is identified.  Small area of acute infarct in the right medial parietal lobe, slightly increased from the prior MRI.   Electronically Signed   By: Marlan Palau M.D.   On: 07/20/2013 20:54    EKG Interpretation    Date/Time:  Tuesday July 20 2013 16:40:06 EST Ventricular Rate:  102 PR Interval:  177 QRS Duration: 90 QT Interval:  357 QTC Calculation: 465 R Axis:   60 Text Interpretation:  Sinus tachycardia Atrial premature complex RSR' in V1 or V2, right VCD or RVH Baseline wander in lead(s) V4 Confirmed by ALLEN  MD, ANTHONY ([id number]) on 07/20/2013 4:47:25 PM            MDM   1. Headache      Filed Vitals:   07/20/13 1930 07/20/13 2045 07/20/13 2100 07/20/13 2130  BP: 173/95 149/106 163/101 163/91  Pulse: 92 103 93 87  Temp:      TempSrc:      Resp:      Weight:      SpO2: 98% 100% 100% 97%     Eric [name] is a 72 y.o. male who had a hemorrhagic CVA 18 days ago. Patient was discharged from the hospital last week. He presents today with headache, bilateral upper extremity pain and coldness in his daughter states that she thinks his gait is more unsteady today. CT shows edema increased from initial CT. MRI obtained and neurology consult from Dr. Amada Jupiter appreciated: He states that the MRI shows no new abnormalities in that the edema is a natural progression from the original CVA. No indication for hospitalization at this time. Patient will be sent home with Percocet for his headache, he is a mild height her choline he at 5.2. Patient's blood pressure was  significantly elevated and admission. Patient states that he had been taking his medication as directed, I will add on hydrochlorothiazide  and to check in with his PCP for further BP management.    Medications  niCARdipine (CARDENE-IV) infusion (0.1 mg/ml) (0 mg/hr Intravenous Hold 07/20/13 1739)  labetalol (NORMODYNE,TRANDATE) injection 10 mg (10 mg Intravenous Given 07/20/13 1712)  dexamethasone (DECADRON) injection  10 mg (10 mg Intravenous Given 07/20/13 1742)  morphine 4 MG/ML injection 4 mg (4 mg Intravenous Given 07/20/13 2050)    Pt is hemodynamically stable, appropriate for, and amenable to discharge at this time. Pt verbalized understanding and agrees with care plan. All questions answered. Outpatient follow-up and specific return precautions discussed.    Discharge Medication List as of 07/20/2013 10:20 PM    START taking these medications   Details  hydrochlorothiazide (HYDRODIURIL) 12.5 MG tablet Take 1 tablet (12.5 mg total) by mouth daily., Starting 07/20/2013, Until Discontinued, Print    oxyCODONE-acetaminophen (PERCOCET/ROXICET) 5-325 MG per tablet 1 to 2 tabs PO q6hrs  PRN for pain, Print        Note: Portions of this report may have been transcribed using voice recognition software. Every effort was made to ensure accuracy; however, inadvertent computerized transcription errors may be present      Wynetta Emery, PA-C 07/22/13 0101

## 2013-07-20 NOTE — ED Notes (Signed)
Neurologist at bedside. 

## 2013-07-20 NOTE — ED Notes (Signed)
Phlebotomy at bedside.

## 2013-07-20 NOTE — ED Notes (Signed)
Pt reports having headache and htn x 1 week, reports taking his meds as prescribed and took them this am, bp is 182/120 at triage.

## 2013-07-20 NOTE — ED Notes (Signed)
Neuro check due at this time- unable to assess due to patient currently being in MRI at this time.

## 2013-07-20 NOTE — ED Provider Notes (Signed)
Medical screening examination/treatment/procedure(s) were conducted as a shared visit with non-physician practitioner(s) and myself.  I personally evaluated the patient during the encounter.  EKG Interpretation    Date/Time:  Tuesday July 20 2013 16:40:06 EST Ventricular Rate:  102 PR Interval:  177 QRS Duration: 90 QT Interval:  357 QTC Calculation: 465 R Axis:   60 Text Interpretation:  Sinus tachycardia Atrial premature complex RSR' in V1 or V2, right VCD or RVH Baseline wander in lead(s) V4 Confirmed by Raiya Stainback  MD, Ninfa Giannelli ([id number]) on 07/20/2013 4:47:25 PM           Patient seen and examined. His CT results reviewed radiologist. Will obtain brain MRI. Blood pressure has improved after meds  Toy Baker, MD 07/20/13 1740

## 2013-07-20 NOTE — ED Notes (Addendum)
Spoke with MRI technician and she stated that patient asked to be taken out of machine halfway through his scans and refused to go back in- [place], PA made aware.

## 2013-07-21 ENCOUNTER — Telehealth: Payer: Self-pay | Admitting: Family Medicine

## 2013-07-21 NOTE — Telephone Encounter (Addendum)
Pt went to ed last night, pt was sent home w/ instructions.  to fu w/ pcp asap. Pt still having headaches, high bp, arms hurt. Pt given a new bp med hydrochlorothiazise, pt would like to know if he should take this along w/ his other bp meds?? Pt would like appt also.

## 2013-07-22 DIAGNOSIS — J4489 Other specified chronic obstructive pulmonary disease: Secondary | ICD-10-CM | POA: Insufficient documentation

## 2013-07-22 DIAGNOSIS — R55 Syncope and collapse: Secondary | ICD-10-CM | POA: Insufficient documentation

## 2013-07-22 DIAGNOSIS — T448X5A Adverse effect of centrally-acting and adrenergic-neuron-blocking agents, initial encounter: Secondary | ICD-10-CM | POA: Insufficient documentation

## 2013-07-22 DIAGNOSIS — Z79899 Other long term (current) drug therapy: Secondary | ICD-10-CM | POA: Insufficient documentation

## 2013-07-22 DIAGNOSIS — Z8673 Personal history of transient ischemic attack (TIA), and cerebral infarction without residual deficits: Secondary | ICD-10-CM | POA: Insufficient documentation

## 2013-07-22 DIAGNOSIS — I10 Essential (primary) hypertension: Secondary | ICD-10-CM | POA: Insufficient documentation

## 2013-07-22 DIAGNOSIS — R5381 Other malaise: Secondary | ICD-10-CM | POA: Insufficient documentation

## 2013-07-22 DIAGNOSIS — T4275XA Adverse effect of unspecified antiepileptic and sedative-hypnotic drugs, initial encounter: Secondary | ICD-10-CM | POA: Insufficient documentation

## 2013-07-22 DIAGNOSIS — Z87448 Personal history of other diseases of urinary system: Secondary | ICD-10-CM | POA: Insufficient documentation

## 2013-07-22 DIAGNOSIS — J449 Chronic obstructive pulmonary disease, unspecified: Secondary | ICD-10-CM | POA: Insufficient documentation

## 2013-07-22 DIAGNOSIS — Z87891 Personal history of nicotine dependence: Secondary | ICD-10-CM | POA: Insufficient documentation

## 2013-07-23 ENCOUNTER — Emergency Department (HOSPITAL_COMMUNITY)
Admission: EM | Admit: 2013-07-23 | Discharge: 2013-07-23 | Disposition: A | Discharge status: Home / Self Care | Payer: Medicare Other | Attending: Emergency Medicine | Admitting: Emergency Medicine

## 2013-07-23 ENCOUNTER — Encounter (HOSPITAL_COMMUNITY): Payer: Self-pay | Admitting: Emergency Medicine

## 2013-07-23 ENCOUNTER — Emergency Department (HOSPITAL_COMMUNITY): Payer: Medicare Other

## 2013-07-23 DIAGNOSIS — T50905A Adverse effect of unspecified drugs, medicaments and biological substances, initial encounter: Secondary | ICD-10-CM

## 2013-07-23 DIAGNOSIS — R55 Syncope and collapse: Secondary | ICD-10-CM

## 2013-07-23 HISTORY — DX: Cerebral infarction, unspecified: I63.9

## 2013-07-23 LAB — CBC
HCT: 39.7 % (ref 39.0–52.0)
Hemoglobin: 14.1 g/dL (ref 13.0–17.0)
MCH: 28.1 pg (ref 26.0–34.0)
MCHC: 35.5 g/dL (ref 30.0–36.0)
MCV: 79.1 fL (ref 78.0–100.0)
RDW: 15.2 % (ref 11.5–15.5)

## 2013-07-23 LAB — URINALYSIS, ROUTINE W REFLEX MICROSCOPIC
Glucose, UA: NEGATIVE mg/dL
Hgb urine dipstick: NEGATIVE
Ketones, ur: NEGATIVE mg/dL
Protein, ur: NEGATIVE mg/dL
Specific Gravity, Urine: 1.016 (ref 1.005–1.030)

## 2013-07-23 LAB — GLUCOSE, CAPILLARY: Glucose-Capillary: 117 mg/dL — ABNORMAL HIGH (ref 70–99)

## 2013-07-23 LAB — POCT I-STAT, CHEM 8
BUN: 19 mg/dL (ref 6–23)
Chloride: 98 mEq/L (ref 96–112)
Creatinine, Ser: 1.2 mg/dL (ref 0.50–1.35)
Hemoglobin: 16.3 g/dL (ref 13.0–17.0)
Sodium: 136 mEq/L (ref 135–145)

## 2013-07-23 LAB — POCT I-STAT TROPONIN I: Troponin i, poc: 0 ng/mL (ref 0.00–0.08)

## 2013-07-23 MED ORDER — FENTANYL CITRATE 0.05 MG/ML IJ SOLN: 100.0000 ug | Freq: Once | INTRAMUSCULAR | Status: DC

## 2013-07-23 NOTE — Telephone Encounter (Signed)
Spoke with daughter.  Patient had to go to ER again.  meds were changed.  She does not wish to go to nephrology until he his seen first at our office.  Appointment made.

## 2013-07-23 NOTE — ED Notes (Signed)
Pt. Wants to talk with MD about drinking and eating; Dr. Andrey Cota, "Not right now."

## 2013-07-23 NOTE — ED Provider Notes (Signed)
Medical screening examination/treatment/procedure(s) were performed by non-physician practitioner and as supervising physician I was immediately available for consultation/collaboration.  EKG Interpretation    Date/Time:  Tuesday July 20 2013 16:40:06 EST Ventricular Rate:  102 PR Interval:  177 QRS Duration: 90 QT Interval:  357 QTC Calculation: 465 R Axis:   60 Text Interpretation:  Sinus tachycardia Atrial premature complex RSR' in V1 or V2, right VCD or RVH Baseline wander in lead(s) V4 Confirmed by Freida Busman  MD, Delaila Nand ([id number]) on 07/20/2013 4:47:25 PM             Toy Baker, MD 07/23/13 [phone]

## 2013-07-23 NOTE — ED Notes (Signed)
Pt. reports dizziness , generalized weakness and chills onset this evening .

## 2013-07-23 NOTE — ED Provider Notes (Signed)
CSN: [id number]     Arrival date & time 07/22/13  2351 History   First MD Initiated Contact with Patient 07/23/13 0122     Chief Complaint  Patient presents with  . Dizziness  . Weakness   (Consider location/radiation/quality/duration/timing/severity/associated sxs/prior Treatment) Patient is a 72 y.o. male presenting with hypertension. The history is provided by the patient and a relative.  Hypertension This is a chronic problem. The current episode started more than 1 week ago. The problem occurs constantly. The problem has not changed since onset.Pertinent negatives include no chest pain, no abdominal pain and no shortness of breath. Nothing aggravates the symptoms. Nothing relieves the symptoms. He has tried nothing for the symptoms. The treatment provided significant relief.  BPs are significantly elevated during the day and then drop rapidly.  Family reports no changes in meds but patient took HCTZ starting this week and then took pain medication and flomax tonight before bp dropped to SBP of 119 and patient felt syncopal.  No CP no SOB no n/v/d.  No focal weakness, no numbness no changes in vision  Past Medical History  Diagnosis Date  . COPD (chronic obstructive pulmonary disease)   . Hypertension   . Benign prostatic hypertrophy   . ED (erectile dysfunction)   . Gynecomastia, male     bilateral breast removal  . Stroke    Past Surgical History  Procedure Laterality Date  . Bilateral breast removal    . Colonoscopy  04/24/05   Family History  Problem Relation Age of Onset  . Diabetes Other   . Hypertension Other    History  Substance Use Topics  . Smoking status: Former Games developer  . Smokeless tobacco: Not on file  . Alcohol Use: No    Review of Systems  Constitutional: Negative for fever, appetite change and fatigue.  HENT: Negative for drooling.   Respiratory: Negative for shortness of breath.   Cardiovascular: Negative for chest pain, palpitations and leg swelling.   Gastrointestinal: Negative for vomiting, abdominal pain and diarrhea.  Neurological: Positive for light-headedness. Negative for seizures, facial asymmetry, speech difficulty, weakness and numbness.  All other systems reviewed and are negative.    Allergies  Review of patient's allergies indicates no known allergies.  Home Medications   Current Outpatient Rx  Name  Route  Sig  Dispense  Refill  . acetaminophen (TYLENOL) 325 MG tablet   Oral   Take 2 tablets (650 mg total) by mouth every 4 (four) hours as needed for mild pain.   30 tablet   2   . hydrochlorothiazide (HYDRODIURIL) 12.5 MG tablet   Oral   Take 1 tablet (12.5 mg total) by mouth daily.   30 tablet   1   . oxyCODONE-acetaminophen (PERCOCET/ROXICET) 5-325 MG per tablet   Oral   Take 1 tablet by mouth every 6 (six) hours as needed for severe pain.         Marland Kitchen quinapril (ACCUPRIL) 20 MG tablet   Oral   Take 20 mg by mouth daily.         . tamsulosin (FLOMAX) 0.4 MG CAPS capsule   Oral   Take 1 capsule (0.4 mg total) by mouth daily.   90 capsule   3   . traMADol (ULTRAM) 50 MG tablet   Oral   Take 2 tablets (100 mg total) by mouth every 6 (six) hours as needed for moderate pain or severe pain.   30 tablet   1    BP  115/64  Pulse 87  Temp(Src) 98.2 F (36.8 C) (Oral)  Resp 14  SpO2 97% Physical Exam  Constitutional: He is oriented to person, place, and time. He appears well-developed and well-nourished. No distress.  HENT:  Head: Normocephalic and atraumatic.  Mouth/Throat: Oropharynx is clear and moist.  Eyes: Conjunctivae and EOM are normal. Pupils are equal, round, and reactive to light.  Neck: Normal range of motion. Neck supple.  Cardiovascular: Normal rate, regular rhythm and intact distal pulses.   Pulmonary/Chest: Effort normal and breath sounds normal. He has no wheezes. He has no rales.  Abdominal: Soft. Bowel sounds are normal. There is no tenderness. There is no rebound and no  guarding.  Musculoskeletal: Normal range of motion. He exhibits no edema.  Neurological: He is alert and oriented to person, place, and time. He has normal reflexes. No cranial nerve deficit.  Skin: Skin is warm and dry.  Psychiatric: He has a normal mood and affect.    ED Course  Procedures (including critical care time) Labs Review Labs Reviewed  CBC  URINALYSIS, ROUTINE W REFLEX MICROSCOPIC   Imaging Review No results found.  EKG Interpretation   None       MDM  No diagnosis found.  Date: 07/23/2013  Rate: 90  Rhythm: normal sinus rhythm  QRS Axis: normal  Intervals: normal  ST/T Wave abnormalities: normal  Conduction Disutrbances:none  Narrative Interpretation:   Old EKG Reviewed: none available    Case d/w Dr. Thad Ranger.  Changes in CT are expected.  Stop BP lowering flomax and narcotics until cleared by PMD   Patient and family informed of need to hold flomax with acute changes in BP and to follow up closely with Dr. Tawanna Cooler  Lucien Budney Smitty Cords, MD 07/23/13 [phone]

## 2013-07-27 ENCOUNTER — Ambulatory Visit (INDEPENDENT_AMBULATORY_CARE_PROVIDER_SITE_OTHER): Payer: Medicare Other | Admitting: Internal Medicine

## 2013-07-27 ENCOUNTER — Ambulatory Visit: Payer: Medicare Other | Admitting: Internal Medicine

## 2013-07-27 ENCOUNTER — Encounter: Payer: Self-pay | Admitting: Internal Medicine

## 2013-07-27 VITALS — BP 140/88 | HR 86 | Temp 97.9°F | Resp 20 | Wt 150.0 lb

## 2013-07-27 DIAGNOSIS — J449 Chronic obstructive pulmonary disease, unspecified: Secondary | ICD-10-CM

## 2013-07-27 DIAGNOSIS — I619 Nontraumatic intracerebral hemorrhage, unspecified: Secondary | ICD-10-CM

## 2013-07-27 DIAGNOSIS — I10 Essential (primary) hypertension: Secondary | ICD-10-CM

## 2013-07-27 MED ORDER — TRAMADOL HCL 50 MG PO TABS: 100.0000 mg | ORAL_TABLET | Freq: Four times a day (QID) | ORAL | Status: DC | PRN

## 2013-07-27 MED ORDER — HYDROCHLOROTHIAZIDE 12.5 MG PO TABS: 12.5000 mg | ORAL_TABLET | Freq: Every day | ORAL | Status: DC

## 2013-07-27 NOTE — Patient Instructions (Signed)
Limit your sodium (Salt) intake  Please check your blood pressure on a regular basis.  If it is consistently greater than 150/90, please make an office appointment.  Return in 3 months for follow-up  

## 2013-07-27 NOTE — Progress Notes (Signed)
Subjective:    Patient ID: Eric [name], male    DOB: 11/17/1940, 72 y.o.   MRN: [id number]  HPI Pre-visit discussion using our clinic review tool. No additional management support is needed unless otherwise documented below in the visit note.  72 year old patient who is seen post hospital discharge for a ICH.  Since his discharge he has been to the ED on at least 2 other locations. Today he feels quite well;  family is closely monitoring blood pressure readings. Flomax has been discontinued due to an apparent syncopal episode. 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  Other and unspecified hyperlipidemia  BMI: Body mass index is 23.61 kg/(m^2).  Past Medical History   Diagnosis  Date   .  COPD (chronic obstructive pulmonary disease)    .  Hypertension    .  Benign prostatic hypertrophy    .  ED (erectile dysfunction)    .  Gynecomastia, male      bilateral breast removal    Past Surgical History   Procedure  Laterality  Date   .  Bilateral breast removal     .  Colonoscopy   04/24/05      Review of Systems  Constitutional: Negative for fever, chills, appetite change and fatigue.  HENT: Negative for congestion, dental problem, ear pain, hearing loss, sore throat, tinnitus, trouble swallowing and voice change.   Eyes: Negative for pain, discharge and visual  disturbance.  Respiratory: Negative for cough, chest tightness, wheezing and stridor.   Cardiovascular: Negative for chest pain, palpitations and leg swelling.  Gastrointestinal: Negative for nausea, vomiting, abdominal pain, diarrhea, constipation, blood in stool and abdominal distention.  Genitourinary: Negative for urgency, hematuria, flank pain, discharge, difficulty urinating and genital sores.  Musculoskeletal: Positive for gait problem. Negative for arthralgias, back pain, joint swelling, myalgias and neck stiffness.  Skin: Negative for rash.  Neurological: Negative for dizziness, syncope, speech difficulty, weakness, numbness and headaches.  Hematological: Negative for adenopathy. Does not bruise/bleed easily.  Psychiatric/Behavioral: Negative for behavioral problems and dysphoric mood. The patient is not nervous/anxious.        Objective:   Physical Exam  Constitutional: He is oriented to person, place, and time. He appears well-developed.  HENT:  Head: Normocephalic.  Right Ear: External ear normal.  Left Ear: External ear normal.  Eyes: Conjunctivae and EOM are normal.  Neck: Normal range of motion.  Cardiovascular: Normal rate and normal heart sounds.   Pulmonary/Chest: Breath sounds normal.  Abdominal: Bowel sounds are normal.  Musculoskeletal: Normal range of motion. He exhibits no edema and no tenderness.  Neurological: He is alert and oriented to person, place, and time.  Unsteady gait but nonfocal  Psychiatric: He has a normal mood and affect. His behavior is normal.          Assessment & Plan:   Hypertension. Reasonable control with blood pressure 140/80. Will continue on present regimen and continue to monitor closely. Patient has had a syncopal episode.  He has done well since discontinuation of Flomax. We'll continue to observe off medication Status post intracerebral hemorrhage  All medications updated

## 2013-07-27 NOTE — Progress Notes (Signed)
Pre-visit discussion using our clinic review tool. No additional management support is needed unless otherwise documented below in the visit note.  

## 2013-08-05 DIAGNOSIS — R51 Headache: Secondary | ICD-10-CM

## 2013-08-05 DIAGNOSIS — I10 Essential (primary) hypertension: Secondary | ICD-10-CM

## 2013-08-05 DIAGNOSIS — I69959 Hemiplegia and hemiparesis following unspecified cerebrovascular disease affecting unspecified side: Secondary | ICD-10-CM

## 2013-08-05 DIAGNOSIS — J449 Chronic obstructive pulmonary disease, unspecified: Secondary | ICD-10-CM

## 2013-08-09 ENCOUNTER — Other Ambulatory Visit: Payer: Self-pay | Admitting: Family Medicine

## 2013-08-10 ENCOUNTER — Telehealth: Payer: Self-pay | Admitting: Family Medicine

## 2013-08-10 NOTE — Telephone Encounter (Signed)
Pt has had a stroke ago a month ago and needs a letter stating due to stroke he is unable to travelling to see his son who is in prison in [place]. Pt is still having headaches, speech impairment. Please mail letter to pt address. Please put letter on our letterhead.

## 2013-08-10 NOTE — Telephone Encounter (Signed)
Okay per Dr Todd 

## 2013-08-11 ENCOUNTER — Encounter: Payer: Self-pay | Admitting: *Deleted

## 2013-08-11 NOTE — Telephone Encounter (Signed)
Letter mailed to home address

## 2013-08-23 ENCOUNTER — Ambulatory Visit: Payer: Self-pay | Admitting: Neurology

## 2013-09-07 ENCOUNTER — Encounter: Payer: Self-pay | Admitting: Neurology

## 2013-09-07 ENCOUNTER — Ambulatory Visit (INDEPENDENT_AMBULATORY_CARE_PROVIDER_SITE_OTHER): Payer: Medicare Other | Admitting: Neurology

## 2013-09-07 ENCOUNTER — Encounter (INDEPENDENT_AMBULATORY_CARE_PROVIDER_SITE_OTHER): Payer: Self-pay

## 2013-09-07 VITALS — BP 162/97 | HR 75 | Ht 68.0 in | Wt 151.0 lb

## 2013-09-07 DIAGNOSIS — R51 Headache: Secondary | ICD-10-CM

## 2013-09-07 MED ORDER — TOPIRAMATE 25 MG PO TABS: 25.0000 mg | ORAL_TABLET | Freq: Every day | ORAL | Status: DC

## 2013-09-07 NOTE — Patient Instructions (Signed)
Start Topamax 25 mg at night for headaches prophylaxis and continue Demerol 100 mg for symptomatic relief as needed. Maintain strict control of hypertension with blood pressure goal below 130/90. Return for followup in 3 months with Jeani Hawking, NP or call earlier if necessary

## 2013-09-07 NOTE — Progress Notes (Signed)
[facility] [address] [place]. [place] [address] [phone]       OFFICE FOLLOW-UP NOTE  Eric. Eric [name] Date of Birth:  May 28, 1941 Medical Record Number:  [id number]   HPI:  Eric [name] is a 19 year african american male seen for first office followup visit for calling hospital admission on 07/02/13 with intracerebral hemorrhage. He presented with sudden onset of slurred speech, left facial weakness and left hemiparesthesias as well as right frontotemporal headache. CT scan of the head showed a 4 cm right temporoparietal parenchymal hemorrhage with surrounding edema and mass effect. NIH stroke scale was 14. She was admitted to the intensive care unit and blood pressure tightly controlled. Admission blood pressure was elevated at 184/100. Followed imaging study showed stable appearance of the hemorrhage. MRI scan the brain confirmed the parenchyma right temporoparietal hemorrhage with volume of 28 cubic cc. MRI of the brain showed no large vessel occlusion. Transthoracic echo showed normal ejection fraction. Carotid ultrasound showed no stenosis. EKG showed regular sinus rhythm. Urine drug screen was negative. Vascular risk factors identified included mild hyperlipidemia with LDL of 104. Patient had persistent headache and was treated with Demerol. He had left homonymous and a hemianopsia and mild left-sided weakness and sensory loss. Clinical status improved he was discharged home. He states his done well his speech and left-sided weakness and numbness is a lot better. Headaches improved but are still persistent particularly at night he has right frontal and periorbital headache which usually responds to come about but at times it is quite disabling yet. He has occasional slurred speech off and on. She states her blood pressure has been quite well controlled at home. He has an appointment with his family physician Dr. Tawanna Cooler next month. He admits to mild short-term memory  difficulties but these are not bothersome. He had followup CT scan of the head done on 07/23/13 which showed expectoration or he change in the hemorrhage with mild persistent edema and mass. effect  ROS:   14 system review of systems is positive for  headache, fatigue, chills, blurred vision, snoring, constipation, feeling hot and cold, increased thirst, joint pain and all other systems negative PMH:  Past Medical History  Diagnosis Date  . COPD (chronic obstructive pulmonary disease)   . Hypertension   . Benign prostatic hypertrophy   . ED (erectile dysfunction)   . Gynecomastia, male     bilateral breast removal  . Stroke     Social History:  History   Social History  . Marital Status: Married    Spouse Name: N/A    Number of Children: 6  . Years of Education: 10th   Occupational History  . retired    Social History Main Topics  . Smoking status: Former Games developer  . Smokeless tobacco: Not on file  . Alcohol Use: No  . Drug Use: No  . Sexual Activity: Yes   Other Topics Concern  . Not on file   Social History Narrative  . No narrative on file    Medications:   Current Outpatient Prescriptions on File Prior to Visit  Medication Sig Dispense Refill  . acetaminophen (TYLENOL) 325 MG tablet Take 2 tablets (650 mg total) by mouth every 4 (four) hours as needed for mild pain.  30 tablet  2  . hydrochlorothiazide (HYDRODIURIL) 12.5 MG tablet Take 1 tablet (12.5 mg total) by mouth daily.  90 tablet  1  . quinapril (ACCUPRIL) 20 MG tablet TAKE 1 TABLET (20 MG  TOTAL) BY MOUTH EVERY MORNING.  100 tablet  2  . traMADol (ULTRAM) 50 MG tablet Take 2 tablets (100 mg total) by mouth every 6 (six) hours as needed for moderate pain or severe pain.  90 tablet  1   No current facility-administered medications on file prior to visit.    Allergies:  No Known Allergies  Physical Exam General: well developed, well nourished elderly African American male, seated, in no evident  distress Head: head normocephalic and atraumatic. Orohparynx benign Neck: supple with no carotid or supraclavicular bruits Cardiovascular: regular rate and rhythm, no murmurs Musculoskeletal: no deformity Skin:  no rash/petichiae Vascular:  Normal pulses all extremities Filed Vitals:   09/07/13 1537  BP: 162/97  Pulse: 75   Neurologic Exam Mental Status: Awake and fully alert. Oriented to place and time. Recent and remote memory intact. Diminished recall 2/3 Attention span, concentration and fund of knowledge appropriate. Mood and affect appropriate.  Cranial Nerves: Fundoscopic exam reveals sharp disc margins. Pupils equal, briskly reactive to light. Extraocular movements full without nystagmus. Visual fields showed dense left homonymous hemianopsia to confrontation. Hearing intact. Facial sensation intact. Face, tongue, palate moves normally and symmetrically.  Motor: Normal bulk and tone. Normal strength in all tested extremity muscles. Mild left grip weakness. Diminished fine finger movements on the left. Orbits right over left upper extremity. Mild increased tone in the left leg with stiffness Sensory.: intact to touch and pinprick and vibratory sensation.  Coordination: Rapid alternating movements normal in all extremities. Finger-to-nose and heel-to-shin performed accurately bilaterally. Gait and Station: Arises from chair without difficulty. Stance is normal. Gait demonstrates normal stride length and balance . Able to heel, toe and tandem walk without difficulty.  Reflexes: 1+ and symmetric. Toes downgoing.   NIHSS  2 Modified Rankin  2  ASSESSMENT: 25 year african [place] male with a 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 Clinical doing well   but has persistent but improving right frontal headaches and left hemianopsia    PLAN: I had a long discussion with the patient and wife  regarding his recent admission, discuss results of hospital evaluation and answered questions. I advised him not to drive and to be careful while walking to avoid injuries and falls due to his peripheral vision loss. Start Topamax 25 mg at night for headaches prophylaxis and continue Tramadol 100 mg for symptomatic relief as needed. Maintain strict control of hypertension with blood pressure goal below 130/90. Return for followup in 3 months with Larita Fife, NP or call earlier if necessary

## 2013-10-05 ENCOUNTER — Encounter: Payer: Self-pay | Admitting: Family Medicine

## 2013-10-05 ENCOUNTER — Ambulatory Visit (INDEPENDENT_AMBULATORY_CARE_PROVIDER_SITE_OTHER): Payer: Medicare Other | Admitting: Family Medicine

## 2013-10-05 VITALS — BP 150/90 | Temp 98.5°F | Wt 155.0 lb

## 2013-10-05 DIAGNOSIS — R51 Headache: Secondary | ICD-10-CM

## 2013-10-05 DIAGNOSIS — I619 Nontraumatic intracerebral hemorrhage, unspecified: Secondary | ICD-10-CM

## 2013-10-05 DIAGNOSIS — I10 Essential (primary) hypertension: Secondary | ICD-10-CM

## 2013-10-05 MED ORDER — QUINAPRIL HCL 20 MG PO TABS: ORAL_TABLET | ORAL | Status: DC

## 2013-10-05 MED ORDER — HYDROCHLOROTHIAZIDE 12.5 MG PO TABS: 12.5000 mg | ORAL_TABLET | Freq: Every day | ORAL | Status: DC

## 2013-10-05 MED ORDER — TRAMADOL HCL 50 MG PO TABS: ORAL_TABLET | ORAL | Status: DC

## 2013-10-05 MED ORDER — TOPIRAMATE 50 MG PO TABS: ORAL_TABLET | ORAL | Status: DC

## 2013-10-05 NOTE — Progress Notes (Signed)
Subjective:    Patient ID: Eric [name], male    DOB: 1940/10/07, 73 y.o.   MRN: [id number]  HPI Eric [name] is a 73 year old male who comes in today accompanied by his family for followup of an intracerebral hemorrhage secondary to hypertension  He's taking his quinapril 20 mg daily but not taking the diuretic. BP today 150/90  He's taking Topamax 25 mg daily for headaches as they seem to help with the headaches are still there.  He's taking 100 mg a tramadol at bedtime for headache. This was given to him when he left the hospital  He says he is no longer smoking   Review of Systems    review of systems negative Objective:   Physical Exam  Well-developed well-nourished male no acute distress vital signs stable is afebrile BP 150/90      Assessment & Plan:  Hypertension not at goal restart diuretic  Status post intracerebral hemorrhage increase Topamax to 50 mg daily decrease the tramadol to one half tab daily each bedtime when necessary

## 2013-10-05 NOTE — Patient Instructions (Signed)
Accupril 20 mg,,,,,,,,, one tablet daily in the morning  Hydrochlorothiazide 12.5 mg........... one tablet daily in the morning  Topamax 50 mg........Marland Kitchen 1 tab daily in the morning  Tramadol 50 mg..... one half tablet at bedtime when necessary for headache  Return in 3 months for followup sooner if any problems .....Marland KitchenMarland Kitchen

## 2013-10-06 ENCOUNTER — Telehealth: Payer: Self-pay | Admitting: Family Medicine

## 2013-10-06 NOTE — Telephone Encounter (Signed)
Relevant patient education mailed to patient.  

## 2013-10-28 ENCOUNTER — Ambulatory Visit: Payer: Medicare Other | Admitting: Family Medicine

## 2013-12-10 ENCOUNTER — Ambulatory Visit: Payer: Medicare Other | Admitting: Nurse Practitioner

## 2013-12-10 ENCOUNTER — Encounter (INDEPENDENT_AMBULATORY_CARE_PROVIDER_SITE_OTHER): Payer: Self-pay

## 2013-12-10 ENCOUNTER — Encounter: Payer: Self-pay | Admitting: Nurse Practitioner

## 2013-12-10 ENCOUNTER — Ambulatory Visit (INDEPENDENT_AMBULATORY_CARE_PROVIDER_SITE_OTHER): Payer: Medicare Other | Admitting: Nurse Practitioner

## 2013-12-10 VITALS — BP 113/67 | HR 70 | Ht 68.0 in | Wt 155.0 lb

## 2013-12-10 DIAGNOSIS — R51 Headache: Secondary | ICD-10-CM

## 2013-12-10 DIAGNOSIS — I10 Essential (primary) hypertension: Secondary | ICD-10-CM

## 2013-12-10 DIAGNOSIS — G8194 Hemiplegia, unspecified affecting left nondominant side: Secondary | ICD-10-CM

## 2013-12-10 DIAGNOSIS — I619 Nontraumatic intracerebral hemorrhage, unspecified: Secondary | ICD-10-CM

## 2013-12-10 DIAGNOSIS — H53462 Homonymous bilateral field defects, left side: Secondary | ICD-10-CM

## 2013-12-10 DIAGNOSIS — H53469 Homonymous bilateral field defects, unspecified side: Secondary | ICD-10-CM

## 2013-12-10 DIAGNOSIS — G8190 Hemiplegia, unspecified affecting unspecified side: Secondary | ICD-10-CM

## 2013-12-10 MED ORDER — GABAPENTIN 300 MG PO CAPS: 300.0000 mg | ORAL_CAPSULE | Freq: Three times a day (TID) | ORAL | Status: DC

## 2013-12-10 MED ORDER — TRAMADOL HCL 50 MG PO TABS: ORAL_TABLET | ORAL | Status: DC

## 2013-12-10 NOTE — Progress Notes (Signed)
PATIENT: Eric [name] DOB: 09-Oct-1940  REASON FOR VISIT: stroke follow up HISTORY FROM: patient  HISTORY OF PRESENT ILLNESS: Mr Eric [name] is a 11 year african american male seen for first office followup visit for calling hospital admission on 07/02/13 with intracerebral hemorrhage. He presented with sudden onset of slurred speech, left facial weakness and left hemiparesthesias as well as right frontotemporal headache. CT scan of the head showed a 4 cm right temporoparietal parenchymal hemorrhage with surrounding edema and mass effect. NIH stroke scale was 14. She was admitted to the intensive care unit and blood pressure tightly controlled. Admission blood pressure was elevated at 184/100. Followed imaging study showed stable appearance of the hemorrhage. MRI scan the brain confirmed the parenchyma right temporoparietal hemorrhage with volume of 28 cubic cc. MRI of the brain showed no large vessel occlusion. Transthoracic echo showed normal ejection fraction. Carotid ultrasound showed no stenosis. EKG showed regular sinus rhythm. Urine drug screen was negative. Vascular risk factors identified included mild hyperlipidemia with LDL of 104. Patient had persistent headache and was treated with Demerol. He had left homonymous and a hemianopsia and mild left-sided weakness and sensory loss. Clinical status improved he was discharged home. He states his done well his speech and left-sided weakness and numbness is a lot better. Headaches improved but are still persistent particularly at night he has right frontal and periorbital headache which usually responds to come about but at times it is quite disabling yet. He has occasional slurred speech off and on. She states his blood pressure has been quite well controlled at home. He has an appointment with his family physician Dr. Tawanna [name] next month. He admits to mild short-term memory difficulties but these are not bothersome. He had followup CT scan of the head  done on 07/23/13 which showed expectoration or he change in the hemorrhage with mild persistent edema and mass effect.  UPDATE 12/10/13 (LL):  [name] returns for stroke follow up.  He states he feels good and his headaches are getting better.  He still has left peripheral vision deficit.  His speech is slurred only when he is very tired.  His strength has returned but complains of left arm pain at times, mostly at night.  His BP is well controlled, it is 113/67 in the office today.    ROS:  14 system review of systems is positive for headache, excessive sweating, wheezing, fatigue, chills, blurred vision, light sensitivity, eye itching, increased thirst, anxiety and all other systems negative  ALLERGIES: No Known Allergies  HOME MEDICATIONS: Outpatient Prescriptions Prior to Visit  Medication Sig Dispense Refill  . acetaminophen (TYLENOL) 325 MG tablet Take 2 tablets (650 mg total) by mouth every 4 (four) hours as needed for mild pain.  30 tablet  2  . hydrochlorothiazide (HYDRODIURIL) 12.5 MG tablet Take 1 tablet (12.5 mg total) by mouth daily.  90 tablet  3  . quinapril (ACCUPRIL) 20 MG tablet TAKE 1 TABLET (20 MG TOTAL) BY MOUTH EVERY MORNING.  100 tablet  3  . topiramate (TOPAMAX) 50 MG tablet 1 tab every morning  100 tablet  3  . traMADol (ULTRAM) 50 MG tablet One half to one tablet at bedtime when necessary  60 tablet  2   No facility-administered medications prior to visit.     PHYSICAL EXAM  Filed Vitals:   12/10/13 1331  BP: 113/67  Pulse: 70  Height: 5\' 8"  (1.727 m)  Weight: 155 lb (70.308 kg)   Body mass  index is 23.57 kg/(m^2).  Physical Exam  General: well developed, well nourished elderly African American male, seated, in no evident distress  Head: head normocephalic and atraumatic. Orohparynx benign  Neck: supple with no carotid or supraclavicular bruits  Cardiovascular: regular rate and rhythm, no murmurs  Musculoskeletal: no deformity  Skin: no  rash/petichiae  Vascular: Normal pulses all extremities   Neurologic Exam  Mental Status: Awake and fully alert. Oriented to place and time. Recent and remote memory intact. Diminished recall 2/3 Attention span, concentration and fund of knowledge appropriate. Mood and affect appropriate.  Cranial Nerves: Pupils equal, briskly reactive to light. Extraocular movements full without nystagmus. Visual fields showed dense left homonymous hemianopsia to confrontation. Hearing intact. Facial sensation intact. Face, tongue, palate moves normally and symmetrically.  Motor: Normal bulk and tone. Normal strength in all tested extremity muscles. Mild left grip weakness. Diminished fine finger movements on the left. Orbits right over left upper extremity. Mild increased tone in the left leg with stiffness  Sensory.: intact to touch and pinprick and vibratory sensation.  Coordination: Rapid alternating movements normal in all extremities. Finger-to-nose and heel-to-shin performed accurately bilaterally.  Gait and Station: Arises from chair without difficulty. Stance is normal. Gait demonstrates normal stride length and balance . Able to heel, toe and tandem walk without difficulty.  Reflexes: 1+ and symmetric. Toes downgoing.   ASSESSMENT AND PLAN 84 year african [place] male with a 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 Clinical doing well but has persistent but improving right frontal headaches and left hemianopsia.  PLAN:  I had a long discussion with the patient and wife regarding his recent admission, discuss results of hospital evaluation and answered questions. I advised him not to drive and to be careful while walking to avoid injuries and falls due to his peripheral vision loss.  Continue Topamax 50 mg at night for headaches prophylaxis and continue Tramadol 100 mg for symptomatic relief as needed.  Start  Gabapentin 300 mg at night for arm pain.  If needed, you may take this medication up to three times a day.  Side effects discussed.   Maintain strict control of hypertension with blood pressure goal below 130/90.  Return for followup in 6 months with Larita Fife, NP or call earlier if necessary.   Meds ordered this encounter  Medications  . DISCONTD: tamsulosin (FLOMAX) 0.4 MG CAPS capsule    Sig:   . gabapentin (NEURONTIN) 300 MG capsule    Sig: Take 1 capsule (300 mg total) by mouth 3 (three) times daily. Start taking only 1 capsule at night. Take during the day as needed.    Dispense:  90 capsule    Refill:  5    Order Specific Question:  Supervising Provider    Answer:  Pearlean Brownie, PRAMOD S [[id number]]  . traMADol (ULTRAM) 50 MG tablet    Sig: One half to one tablet at bedtime when necessary    Dispense:  60 tablet    Refill:  5    Order Specific Question:  Supervising Provider    Answer:  Micki Riley [[id number]]   Return in about 6 months (around 06/11/2014).  Ronal Fear, MSN, NP-C 12/10/2013, 2:35 PM [facility] [address], [address] [place], [place] [address] [phone]  Note: This document was prepared with digital dictation and possible smart phrase technology. Any transcriptional errors that result from this process are unintentional.

## 2013-12-10 NOTE — Patient Instructions (Signed)
Continue Topamax 50 mg at night for headaches prevention and continue Tramadol 100 mg for symptomatic relief as needed. Start Gabapentin 300 mg at night for arm pain.  If needed, you may take this medication up to three times a day.  Side effects could be dizziness or increased drowsiness, especially in the first few days of taking it.  Take extra care by turning your head more often to see objects on your left side.  The vision loss may get better or it may be permanent.  Only time will tell.  Maintain strict control of hypertension with blood pressure goal below 130/90.  Return for followup in 6 months with Eric Hawking, NP or call earlier if necessary.

## 2014-01-03 ENCOUNTER — Ambulatory Visit (INDEPENDENT_AMBULATORY_CARE_PROVIDER_SITE_OTHER): Payer: Medicare Other | Admitting: Family Medicine

## 2014-01-03 ENCOUNTER — Encounter: Payer: Self-pay | Admitting: Family Medicine

## 2014-01-03 VITALS — BP 140/90 | Temp 98.1°F | Wt 157.0 lb

## 2014-01-03 DIAGNOSIS — I10 Essential (primary) hypertension: Secondary | ICD-10-CM

## 2014-01-03 NOTE — Patient Instructions (Signed)
Continue your current medications  For followup for your annual Medicare wellness examination the third week in October  Labs one week prior

## 2014-01-03 NOTE — Progress Notes (Signed)
Pre visit review using our clinic review tool, if applicable. No additional management support is needed unless otherwise documented below in the visit note. 

## 2014-01-03 NOTE — Progress Notes (Signed)
Subjective:    Patient ID: Eric [name], male    DOB: 1941/02/26, 73 y.o.   MRN: [id number]  HPI Eric [name] is a 73 year old male who comes in today for followup of hypertension. We adjusted his medicine is hydrochlorothiazide 12.5 mg daily and Accupril 20 mg daily. BP 140/90   Review of Systems    negative review of systems physical examination October 2014 Objective:   Physical Exam  Well-developed well-nourished male no acute distress vital signs stable he is afebrile BP 140/90 right arm sitting position      Assessment & Plan:  Hypertension at goal,,,,,,,,,

## 2014-03-17 ENCOUNTER — Other Ambulatory Visit: Payer: Self-pay | Admitting: Family Medicine

## 2014-06-02 DIAGNOSIS — I639 Cerebral infarction, unspecified: Secondary | ICD-10-CM

## 2014-06-02 HISTORY — DX: Cerebral infarction, unspecified: I63.9

## 2014-06-13 ENCOUNTER — Encounter: Payer: Self-pay | Admitting: Nurse Practitioner

## 2014-06-13 ENCOUNTER — Encounter (INDEPENDENT_AMBULATORY_CARE_PROVIDER_SITE_OTHER): Payer: Self-pay

## 2014-06-13 ENCOUNTER — Ambulatory Visit (INDEPENDENT_AMBULATORY_CARE_PROVIDER_SITE_OTHER): Payer: Medicare Other | Admitting: Nurse Practitioner

## 2014-06-13 VITALS — BP 151/85 | HR 61 | Temp 98.6°F | Ht 69.0 in | Wt 162.0 lb

## 2014-06-13 DIAGNOSIS — R519 Headache, unspecified: Secondary | ICD-10-CM

## 2014-06-13 DIAGNOSIS — G8194 Hemiplegia, unspecified affecting left nondominant side: Secondary | ICD-10-CM

## 2014-06-13 DIAGNOSIS — G8190 Hemiplegia, unspecified affecting unspecified side: Secondary | ICD-10-CM

## 2014-06-13 DIAGNOSIS — H53462 Homonymous bilateral field defects, left side: Secondary | ICD-10-CM

## 2014-06-13 DIAGNOSIS — R51 Headache: Secondary | ICD-10-CM

## 2014-06-13 DIAGNOSIS — G8929 Other chronic pain: Secondary | ICD-10-CM

## 2014-06-13 MED ORDER — TRAMADOL HCL 50 MG PO TABS: 100.0000 mg | ORAL_TABLET | Freq: Two times a day (BID) | ORAL | Status: DC | PRN

## 2014-06-13 NOTE — Progress Notes (Signed)
I agree with the above plan 

## 2014-06-13 NOTE — Patient Instructions (Addendum)
Continue Tramadol 100 mg for symptomatic relief as needed, you may take 2 tablets every 12 hours. Continue Gabapentin 300 mg at night for arm pain.  If needed, you may take this medication up to three times a day.  Maintain strict control of hypertension with blood pressure goal below 130/90. Please followup with your PCP for throat clearing/drainage as this may be reflux.  Return for followup in 6 months or call earlier if necessary.   Stroke Prevention Some medical conditions and behaviors are associated with an increased chance of having a stroke. You may prevent a stroke by making healthy choices and managing medical conditions. HOW CAN I REDUCE MY RISK OF HAVING A STROKE?   Stay physically active. Get at least 30 minutes of activity on most or all days.  Do not smoke. It may also be helpful to avoid exposure to secondhand smoke.  Limit alcohol use. Moderate alcohol use is considered to be:  No more than 2 drinks per day for men.  No more than 1 drink per day for nonpregnant women.  Eat healthy foods. This involves:  Eating 5 or more servings of fruits and vegetables a day.  Making dietary changes that address high blood pressure (hypertension), high cholesterol, diabetes, or obesity.  Manage your cholesterol levels.  Making food choices that are high in fiber and low in saturated fat, trans fat, and cholesterol may control cholesterol levels.  Take any prescribed medicines to control cholesterol as directed by your health care provider.  Manage your diabetes.  Controlling your carbohydrate and sugar intake is recommended to manage diabetes.  Take any prescribed medicines to control diabetes as directed by your health care provider.  Control your hypertension.  Making food choices that are low in salt (sodium), saturated fat, trans fat, and cholesterol is recommended to manage hypertension.  Take any prescribed medicines to control hypertension as directed by your health  care provider.  Maintain a healthy weight.  Reducing calorie intake and making food choices that are low in sodium, saturated fat, trans fat, and cholesterol are recommended to manage weight.  Stop drug abuse.  Avoid taking birth control pills.  Talk to your health care provider about the risks of taking birth control pills if you are over 27 years old, smoke, get migraines, or have ever had a blood clot.  Get evaluated for sleep disorders (sleep apnea).  Talk to your health care provider about getting a sleep evaluation if you snore a lot or have excessive sleepiness.  Take medicines only as directed by your health care provider.  For some people, aspirin or blood thinners (anticoagulants) are helpful in reducing the risk of forming abnormal blood clots that can lead to stroke. If you have the irregular heart rhythm of atrial fibrillation, you should be on a blood thinner unless there is a good reason you cannot take them.  Understand all your medicine instructions.  Make sure that other conditions (such as anemia or atherosclerosis) are addressed. SEEK IMMEDIATE MEDICAL CARE IF:   You have sudden weakness or numbness of the face, arm, or leg, especially on one side of the body.  Your face or eyelid droops to one side.  You have sudden confusion.  You have trouble speaking (aphasia) or understanding.  You have sudden trouble seeing in one or both eyes.  You have sudden trouble walking.  You have dizziness.  You have a loss of balance or coordination.  You have a sudden, severe headache with no known  cause.  You have new chest pain or an irregular heartbeat. Any of these symptoms may represent a serious problem that is an emergency. Do not wait to see if the symptoms will go away. Get medical help at once. Call your local emergency services (911 in [place] Do not drive yourself to the hospital. Document Released: 09/26/2004 Document Revised: 01/03/2014 Document Reviewed:  02/19/2013 [facility] Patient Information 2015 [place], [place]. This information is not intended to replace advice given to you by your health care provider. Make sure you discuss any questions you have with your health care provider.

## 2014-06-13 NOTE — Progress Notes (Signed)
PATIENT: Eric [name] DOB: [birth year]  REASON FOR VISIT: routine follow up for headache HISTORY FROM: patient, daughter  HISTORY OF PRESENT ILLNESS: Eric [name] is a 63 year african american male seen today for followup visit for ICH, post-stroke headaches.  UPDATE 06/13/14 (LL): He returns for stroke follow up.  He states his headaches have persisted, but Tramadol does help if he takes 2. He feels his vision is much improved and he has adapted to his peripheral vision loss. Left arm pain persists and is most bothersome at night. Gabapentin does help but he does not often take this during the day. He did not find any benefit from Topamax for headaches and stopped it on his own.  He does not drive anymore. His daughter is concerned that he has to clear his throat often, but he states that he does not get choked on foods or drinks. He states his blood pressure is well controlled although it is elevated in the office today, 151/85. He has no new complaints.  UPDATE 12/10/13 (LL): Eric [name] returns for stroke follow up. He states he feels good and his headaches are getting better. He still has left peripheral vision deficit. His speech is slurred only when he is very tired. His strength has returned but complains of left arm pain at times, mostly at night. His BP is well controlled, it is 113/67 in the office today.  On 07/02/13, he presented to ER with sudden onset of slurred speech, left facial weakness and left hemiparesthesias as well as right frontotemporal headache. CT scan of the head showed a 4 cm right temporoparietal parenchymal hemorrhage with surrounding edema and mass effect. NIH stroke scale was 14. He was admitted to the intensive care unit and blood pressure tightly controlled. Admission blood pressure was elevated at 184/100. Followed imaging study showed stable appearance of the hemorrhage. MRI scan the brain confirmed the parenchyma right temporoparietal hemorrhage with volume  of 28 cubic cc. MRI of the brain showed no large vessel occlusion. Transthoracic echo showed normal ejection fraction. Carotid ultrasound showed no stenosis. EKG showed regular sinus rhythm. Urine drug screen was negative. Vascular risk factors identified included mild hyperlipidemia with LDL of 104. Patient had persistent headache and was treated with Demerol. He had left homonymous and a hemianopsia and mild left-sided weakness and sensory loss. Clinical status improved he was discharged home. He states his done well his speech and left-sided weakness and numbness is a lot better. Headaches improved but are still persistent particularly at night he has right frontal and periorbital headache which usually responds to come about but at times it is quite disabling yet. He has occasional slurred speech off and on. She states his blood pressure has been quite well controlled at home. He has an appointment with his family physician Dr. Tawanna [name] next month. He admits to mild short-term memory difficulties but these are not bothersome. He had followup CT scan of the head done on 07/23/13 which showed expectoration or he change in the hemorrhage with mild persistent edema and mass effect.   ROS:  14 system review of systems is positive for headache, excessive sweating, increased thirst, joint pain, back pain and speech difficulty and all other systems negative  ALLERGIES: No Known Allergies  HOME MEDICATIONS: Outpatient Prescriptions Prior to Visit  Medication Sig Dispense Refill  . acetaminophen (TYLENOL) 325 MG tablet Take 2 tablets (650 mg total) by mouth every 4 (four) hours as needed for mild pain.  30 tablet  2  . gabapentin (NEURONTIN) 300 MG capsule Take 1 capsule (300 mg total) by mouth 3 (three) times daily. Start taking only 1 capsule at night. Take during the day as needed.  90 capsule  5  . hydrochlorothiazide (HYDRODIURIL) 12.5 MG tablet Take 1 tablet (12.5 mg total) by mouth daily.  90 tablet  3  .  quinapril (ACCUPRIL) 20 MG tablet TAKE 1 TABLET (20 MG TOTAL) BY MOUTH EVERY MORNING.  100 tablet  3  . topiramate (TOPAMAX) 50 MG tablet 1 tab every morning  100 tablet  3  . traMADol (ULTRAM) 50 MG tablet TAKE 1/2-1 TABLET BY MOUTH AT BEDTIME WHEN NECESSARY  60 tablet  5   No facility-administered medications prior to visit.     Filed Vitals:   06/13/14 1515  BP: 151/85  Pulse: 61  Temp: 98.6 F (37 C)  TempSrc: Oral  Height: 5\' 9"  (1.753 m)  Weight: 162 lb (73.483 kg)   Body mass index is 23.91 kg/(m^2).  Physical Exam  General: well developed, well nourished elderly African American male, seated, in no evident distress  Head: head normocephalic and atraumatic. Orohparynx benign  Neck: supple with no carotid or supraclavicular bruits  Cardiovascular: regular rate and rhythm, no murmurs  Musculoskeletal: no deformity  Skin: no rash/petichiae  Vascular: Normal pulses all extremities   Neurologic Exam  Mental Status: Awake and fully alert. Oriented to place and time. Recent and remote memory intact. Diminished recall 2/3 Attention span, concentration and fund of knowledge appropriate. Mood and affect appropriate.  Cranial Nerves: Pupils equal, briskly reactive to light. Extraocular movements full without nystagmus. Visual fields showed dense left homonymous hemianopsia to confrontation. Hearing intact. Facial sensation intact. Face, tongue, palate moves normally and symmetrically.  Motor: Normal bulk and tone. Normal strength in all tested extremity muscles. Mild left grip weakness. Diminished fine finger movements on the left. Orbits right over left upper extremity. Mild increased tone in the left leg with stiffness  Sensory: intact to touch and pinprick and vibratory sensation.  Coordination: Rapid alternating movements normal in all extremities. Finger-to-nose and heel-to-shin performed accurately bilaterally.  Gait and Station: Arises from chair without difficulty. Stance is  normal. Gait demonstrates normal stride length and balance . Able to heel, toe and tandem walk without difficulty.  Reflexes: 1+ and symmetric. Toes downgoing.    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 Clinical doing well but has persistent right frontal headaches, and left hemianopsia. Topamax was not effective as a headache preventative and he stopped taking it on his own. Tramadol has been effective for headache and arm pain when he takes 2 tablets.  PLAN:  I advised him not to drive and to be careful while walking to avoid injuries and falls due to his peripheral vision loss.  Continue Tramadol 100 mg for symptomatic relief as needed, you may take 2 tablets every 12 hours. Continue Gabapentin 300 mg at night for arm pain.  If needed, you may take this medication up to three times a day.  Maintain strict control of hypertension with blood pressure goal below 130/90. Please followup with your PCP for throat clearing/drainage as this may be reflux.  Return for followup in 6 months or call earlier if necessary.   Meds ordered this encounter  Medications  . traMADol (ULTRAM) 50 MG tablet    Sig: Take 2 tablets (100 mg total) by mouth every  12 (twelve) hours as needed for moderate pain.    Dispense:  120 tablet    Refill:  5    Order Specific Question:  Supervising Provider    Answer:  Delia Heady [[id number]]   Return in about 6 months (around 12/13/2014) for stroke follow up.  Tawny Asal Kouper Spinella, MSN, FNP-BC, A/GNP-C 06/13/2014, 3:59 PM [facility] [name] [place], [place] [address] [phone]  Note: This document was prepared with digital dictation and possible smart phrase technology. Any transcriptional errors that result from this process are unintentional.

## 2014-06-20 ENCOUNTER — Encounter: Payer: Medicare Other | Admitting: Family Medicine

## 2014-06-21 ENCOUNTER — Ambulatory Visit (INDEPENDENT_AMBULATORY_CARE_PROVIDER_SITE_OTHER): Payer: Medicare Other | Admitting: Family Medicine

## 2014-06-21 ENCOUNTER — Encounter: Payer: Self-pay | Admitting: Family Medicine

## 2014-06-21 VITALS — BP 160/100 | Temp 98.4°F | Ht 69.0 in | Wt 160.0 lb

## 2014-06-21 DIAGNOSIS — I616 Nontraumatic intracerebral hemorrhage, multiple localized: Secondary | ICD-10-CM

## 2014-06-21 DIAGNOSIS — R351 Nocturia: Secondary | ICD-10-CM

## 2014-06-21 DIAGNOSIS — I615 Nontraumatic intracerebral hemorrhage, intraventricular: Secondary | ICD-10-CM

## 2014-06-21 DIAGNOSIS — I10 Essential (primary) hypertension: Secondary | ICD-10-CM

## 2014-06-21 DIAGNOSIS — Z23 Encounter for immunization: Secondary | ICD-10-CM

## 2014-06-21 DIAGNOSIS — N401 Enlarged prostate with lower urinary tract symptoms: Secondary | ICD-10-CM

## 2014-06-21 LAB — CBC WITH DIFFERENTIAL/PLATELET
BASOS ABS: 0 10*3/uL (ref 0.0–0.1)
Basophils Relative: 0.3 % (ref 0.0–3.0)
Eosinophils Absolute: 0 10*3/uL (ref 0.0–0.7)
Eosinophils Relative: 0.5 % (ref 0.0–5.0)
HCT: 43.5 % (ref 39.0–52.0)
Hemoglobin: 14.1 g/dL (ref 13.0–17.0)
LYMPHS ABS: 1.6 10*3/uL (ref 0.7–4.0)
LYMPHS PCT: 18 % (ref 12.0–46.0)
MCHC: 32.5 g/dL (ref 30.0–36.0)
MCV: 86 fl (ref 78.0–100.0)
MONO ABS: 0.7 10*3/uL (ref 0.1–1.0)
Monocytes Relative: 7.4 % (ref 3.0–12.0)
NEUTROS ABS: 6.6 10*3/uL (ref 1.4–7.7)
Neutrophils Relative %: 73.8 % (ref 43.0–77.0)
Platelets: 295 10*3/uL (ref 150.0–400.0)
RBC: 5.05 Mil/uL (ref 4.22–5.81)
RDW: 14.9 % (ref 11.5–15.5)
WBC: 8.9 10*3/uL (ref 4.0–10.5)

## 2014-06-21 LAB — HEPATIC FUNCTION PANEL
ALBUMIN: 3.6 g/dL (ref 3.5–5.2)
ALK PHOS: 75 U/L (ref 39–117)
ALT: 17 U/L (ref 0–53)
AST: 26 U/L (ref 0–37)
BILIRUBIN DIRECT: 0.1 mg/dL (ref 0.0–0.3)
Total Bilirubin: 1 mg/dL (ref 0.2–1.2)
Total Protein: 7.9 g/dL (ref 6.0–8.3)

## 2014-06-21 LAB — BASIC METABOLIC PANEL
BUN: 12 mg/dL (ref 6–23)
CALCIUM: 10 mg/dL (ref 8.4–10.5)
CO2: 30 meq/L (ref 19–32)
Chloride: 101 mEq/L (ref 96–112)
Creatinine, Ser: 1.1 mg/dL (ref 0.4–1.5)
GFR: 84.26 mL/min (ref >60.00)
GLUCOSE: 93 mg/dL (ref 70–99)
Potassium: 5.2 mEq/L — ABNORMAL HIGH (ref 3.5–5.1)
Sodium: 142 mEq/L (ref 135–145)

## 2014-06-21 LAB — POCT URINALYSIS DIPSTICK
Bilirubin, UA: NEGATIVE
Glucose, UA: NEGATIVE
Ketones, UA: NEGATIVE
NITRITE UA: NEGATIVE
PH UA: 7
PROTEIN UA: NEGATIVE
RBC UA: NEGATIVE
SPEC GRAV UA: 1.01
UROBILINOGEN UA: 1

## 2014-06-21 LAB — TSH: TSH: 1.44 u[IU]/mL (ref 0.35–4.50)

## 2014-06-21 LAB — PSA: PSA: 1.57 ng/mL (ref 0.10–4.00)

## 2014-06-21 MED ORDER — QUINAPRIL HCL 20 MG PO TABS: ORAL_TABLET | ORAL | Status: DC

## 2014-06-21 MED ORDER — HYDROCHLOROTHIAZIDE 12.5 MG PO TABS: 12.5000 mg | ORAL_TABLET | Freq: Every day | ORAL | Status: DC

## 2014-06-21 NOTE — Progress Notes (Signed)
Subjective:    Patient ID: Eric [name], male    DOB: Jul 08, 1941, 73 y.o.   MRN: [id number]  HPI Eric [name] is a 73 year old male X. smoker who comes in today for evaluation of hypertension  He's on hydrochlorothiazide 12.5 mg daily and Accupril 20 mg daily. BP today 160/100. He states he monitors his blood pressure at home. He states he took it last week it was 135/84.  He's also followed in neurology because he had intracranial hemorrhage. He's off all his medications except they gave him some tramadol to take each bedtime when necessary  Vaccinations updated by Fleet Contras He also has a history of BPH with nocturia states he is urinating okay.  Review of Systems  Constitutional: Negative.   HENT: Negative.   Eyes: Negative.   Respiratory: Negative.   Cardiovascular: Negative.   Gastrointestinal: Negative.   Endocrine: Negative.   Genitourinary: Negative.   Musculoskeletal: Negative.   Skin: Negative.   Allergic/Immunologic: Negative.   Neurological: Negative.   Hematological: Negative.   Psychiatric/Behavioral: Negative.        Objective:   Physical Exam  Nursing note and vitals reviewed. Constitutional: He is oriented to person, place, and time. He appears well-developed and well-nourished.  HENT:  Head: Normocephalic and atraumatic.  Right Ear: External ear normal.  Left Ear: External ear normal.  Nose: Nose normal.  Mouth/Throat: Oropharynx is clear and moist.  Eyes: Conjunctivae and EOM are normal. Pupils are equal, round, and reactive to light.  Neck: Normal range of motion. Neck supple. No JVD present. No tracheal deviation present. No thyromegaly present.  Cardiovascular: Normal rate, regular rhythm, normal heart sounds and intact distal pulses.  Exam reveals no gallop and no friction rub.   No murmur heard. No carotid nerdy bruits peripheral pulses 2+ and symmetrical  Pulmonary/Chest: Effort normal and breath sounds normal. No stridor. No respiratory distress.  He has no wheezes. He has no rales. He exhibits no tenderness.  Abdominal: Soft. Bowel sounds are normal. He exhibits no distension and no mass. There is no tenderness. There is no rebound and no guarding.  Genitourinary: Rectum normal and penis normal. Guaiac negative stool. No penile tenderness.  2+ symmetrical nonnodular BPH  Musculoskeletal: Normal range of motion. He exhibits no edema and no tenderness.  Lymphadenopathy:    He has no cervical adenopathy.  Neurological: He is alert and oriented to person, place, and time. He has normal reflexes. No cranial nerve deficit. He exhibits normal muscle tone.  Skin: Skin is warm and dry. No rash noted. No erythema. No pallor.  Psychiatric: He has a normal mood and affect. His behavior is normal. Judgment and thought content normal.          Assessment & Plan:  Hypertension not at goal,,,,,,,, BP check daily followup in one week  History of intracranial hemorrhage probably secondary to uncontrolled hypertension........... no neurologic sequelae followed by neurology at this juncture  BPH observe,,,,

## 2014-06-21 NOTE — Progress Notes (Signed)
Pre visit review using our clinic review tool, if applicable. No additional management support is needed unless otherwise documented below in the visit note. 

## 2014-06-21 NOTE — Patient Instructions (Signed)
Take your blood pressure medication daily  Check a blood pressure every morning  Return in one week for followup  When you return bring a record of all your blood pressure readings and the device

## 2014-06-23 ENCOUNTER — Encounter: Payer: Self-pay | Admitting: *Deleted

## 2014-06-28 ENCOUNTER — Ambulatory Visit (INDEPENDENT_AMBULATORY_CARE_PROVIDER_SITE_OTHER): Payer: Medicare Other | Admitting: Family Medicine

## 2014-06-28 ENCOUNTER — Encounter: Payer: Self-pay | Admitting: Family Medicine

## 2014-06-28 VITALS — BP 140/84 | Temp 97.8°F | Wt 164.0 lb

## 2014-06-28 DIAGNOSIS — I10 Essential (primary) hypertension: Secondary | ICD-10-CM

## 2014-06-28 NOTE — Progress Notes (Signed)
Subjective:    Patient ID: Eric [name], male    DOB: 1941/08/27, 73 y.o.   MRN: [id number]  HPI  Eric [name] is a 73 year old man who comes back today for follow-up of hypertension  Is on hydrochlorothiazide 12.5 mg daily and Accupril 20 mg daily. BP now 140/84  No side effects from medication  Review of Systems    review of systems otherwise negative Objective:   Physical Exam  Well-developed well-nourished man in no acute distress vital signs today was afebrile BP right arm sitting position 140/84      Assessment & Plan:  Hypertension at goal............ continue daily medication follow-up when necessary.

## 2014-06-28 NOTE — Progress Notes (Signed)
Pre visit review using our clinic review tool, if applicable. No additional management support is needed unless otherwise documented below in the visit note. 

## 2014-08-18 ENCOUNTER — Other Ambulatory Visit: Payer: Self-pay | Admitting: Nurse Practitioner

## 2014-08-19 ENCOUNTER — Telehealth: Payer: Self-pay | Admitting: Neurology

## 2014-08-19 NOTE — Telephone Encounter (Signed)
Patient's daughter Thayer Headings, requesting Rx refill for gabapentin (NEURONTIN) 300 MG capsule.  Please call and advise.

## 2014-08-19 NOTE — Telephone Encounter (Signed)
Rx was already sent earlier today at 8:04am.  I called back.  They are aware.

## 2014-11-28 ENCOUNTER — Other Ambulatory Visit: Payer: Self-pay | Admitting: Family Medicine

## 2014-12-13 ENCOUNTER — Encounter: Payer: Self-pay | Admitting: Neurology

## 2014-12-13 ENCOUNTER — Ambulatory Visit (INDEPENDENT_AMBULATORY_CARE_PROVIDER_SITE_OTHER): Payer: Medicare Other | Admitting: Neurology

## 2014-12-13 VITALS — BP 141/88 | HR 69 | Ht 69.0 in | Wt 162.0 lb

## 2014-12-13 DIAGNOSIS — R51 Headache: Secondary | ICD-10-CM | POA: Diagnosis not present

## 2014-12-13 DIAGNOSIS — I10 Essential (primary) hypertension: Secondary | ICD-10-CM

## 2014-12-13 DIAGNOSIS — I612 Nontraumatic intracerebral hemorrhage in hemisphere, unspecified: Secondary | ICD-10-CM

## 2014-12-13 DIAGNOSIS — G44309 Post-traumatic headache, unspecified, not intractable: Secondary | ICD-10-CM

## 2014-12-13 DIAGNOSIS — S069X0S Unspecified intracranial injury without loss of consciousness, sequela: Secondary | ICD-10-CM

## 2014-12-13 MED ORDER — TRAMADOL HCL 50 MG PO TABS: 100.0000 mg | ORAL_TABLET | Freq: Four times a day (QID) | ORAL | Status: DC | PRN

## 2014-12-13 MED ORDER — TRAMADOL HCL 50 MG PO TABS: 100.0000 mg | ORAL_TABLET | Freq: Three times a day (TID) | ORAL | Status: DC | PRN

## 2014-12-13 MED ORDER — TOPIRAMATE 50 MG PO TABS: 50.0000 mg | ORAL_TABLET | Freq: Two times a day (BID) | ORAL | Status: DC

## 2014-12-13 MED ORDER — GABAPENTIN 300 MG PO CAPS: 300.0000 mg | ORAL_CAPSULE | Freq: Three times a day (TID) | ORAL | Status: DC

## 2014-12-13 NOTE — Progress Notes (Signed)
[facility]    Provider:  Dr Jaynee Eagles Referring Provider: Dorena Cookey, MD Primary Care Physician:  Joycelyn Man, MD  CC:  Headaches after stroke  HISTORY OF PRESENT ILLNESS: Eric [name] is a 54 year african [place] male seen for first office followup visit for calling hospital admission on 07/02/13 with intracerebral hemorrhage. He presented with sudden onset of slurred speech, left facial weakness and left hemiparesthesias as well as right frontotemporal headache. CT scan of the head showed a 4 cm right temporoparietal parenchymal hemorrhage with surrounding edema and mass effect. NIH stroke scale was 14. She was admitted to the intensive care unit and blood pressure tightly controlled. Admission blood pressure was elevated at 184/100. Followed imaging study showed stable appearance of the hemorrhage. MRI scan the brain confirmed the parenchyma right temporoparietal hemorrhage with volume of 28 cubic cc. MRI of the brain showed no large vessel occlusion. Transthoracic echo showed normal ejection fraction. Carotid ultrasound showed no stenosis. EKG showed regular sinus rhythm. Urine drug screen was negative. Vascular risk factors identified included mild hyperlipidemia with LDL of 104. Patient had persistent headache and was treated with Demerol. He had left homonymous and a hemianopsia and mild left-sided weakness and sensory loss. Clinical status improved he was discharged home. He states his done well his speech and left-sided weakness and numbness is a lot better. Headaches improved but are still persistent particularly at night he has right frontal and periorbital headache which usually responds to come about but at times it is quite disabling yet. He has occasional slurred speech off and on. She states his blood pressure has been quite well controlled at home. He has an appointment with his family physician Dr. Sherren Mocha next month. He admits to mild short-term memory  difficulties but these are not bothersome. He had followup CT scan of the head done on 07/23/13 which showed expectoration or he change in the hemorrhage with mild persistent edema and mass effect.  UPDATE 12/13/14 (Dr. Jaynee Eagles): Patient tranferred to my care due to headaches after stroke. Patient's headaches have resolved. Patient is not taking the topamax. No headaches. He takes chronic pain medication for the arms after stroke. Charlott Holler and dr. Leonie Man were prescribing Tramadol. Advised if he needs anything stronger we can refer to a pain clinic. He describes sever pain, his he stiffness, after the stroke, mostly in the evenings after use, worse when laying down for long periods. Advised to stay on the Topamax as we use it for headaches as well as for pain. He has numbness and tingling in the hand after his stroke. He sees an eye doctor regularly.   UPDATE 12/10/13 (LL): Eric. Manes returns for stroke follow up. He states he feels good and his headaches are getting better. He still has left peripheral vision deficit. His speech is slurred only when he is very tired. His strength has returned but complains of left arm pain at times, mostly at night. His BP is well controlled, it is 113/67 in the office today.   Review of Systems: Patient complains of symptoms per HPI as well as the following symptoms: cold intolerance, heat intolerance, difficulty urinating. Pertinent negatives per HPI. All others negative.   History   Social History  . Marital Status: Married    Spouse Name: Nellie  . Number of Children: 6  . Years of Education: 10th   Occupational History  . retired    Social History Main Topics  . Smoking status: Former Research scientist (life sciences)  .  Smokeless tobacco: Never Used  . Alcohol Use: No  . Drug Use: No  . Sexual Activity: Yes   Other Topics Concern  . Not on file   Social History Narrative   Patient lives at home with wife,consumes no caffeine             Family History  Problem  Relation Age of Onset  . Diabetes Other   . Hypertension Other   . Arthritis Mother   . Diabetes Father     Past Medical History  Diagnosis Date  . COPD (chronic obstructive pulmonary disease)   . Hypertension   . Benign prostatic hypertrophy   . ED (erectile dysfunction)   . Gynecomastia, male     bilateral breast removal  . Stroke     Past Surgical History  Procedure Laterality Date  . Bilateral breast removal    . Colonoscopy  04/24/05    Current Outpatient Prescriptions  Medication Sig Dispense Refill  . acetaminophen (TYLENOL) 325 MG tablet Take 2 tablets (650 mg total) by mouth every 4 (four) hours as needed for mild pain. 30 tablet 2  . gabapentin (NEURONTIN) 300 MG capsule START WITH 1 CAPSULE AT NIGHT AND TAKE DURING THE DAY AS NEEDED UP TO 3 TIMES DAILY 90 capsule 4  . hydrochlorothiazide (HYDRODIURIL) 12.5 MG tablet Take 1 tablet (12.5 mg total) by mouth daily. 90 tablet 3  . hydrochlorothiazide (MICROZIDE) 12.5 MG capsule TAKE ONE CAPSULE BY MOUTH EVERY DAY 90 capsule 2  . quinapril (ACCUPRIL) 20 MG tablet TAKE 1 TABLET (20 MG TOTAL) BY MOUTH EVERY MORNING. 100 tablet 3  . topiramate (TOPAMAX) 50 MG tablet 1 tab every morning 100 tablet 3  . traMADol (ULTRAM) 50 MG tablet Take 2 tablets (100 mg total) by mouth every 12 (twelve) hours as needed for moderate pain. 120 tablet 5   No current facility-administered medications for this visit.    Allergies as of 12/13/2014  . (No Known Allergies)    Vitals: Ht 5\' 9"  (1.753 m) Last Weight:  Wt Readings from Last 1 Encounters:  06/28/14 164 lb (74.39 kg)   Last Height:   Ht Readings from Last 1 Encounters:  12/13/14 5\' 9"  (1.753 m)    Cranial Nerves: Pupils equal, briskly reactive to light. Extraocular movements full without nystagmus. Visual fields showed dense left homonymous hemianopsia to confrontation. Hearing intact. Facial sensation intact. Face, tongue, palate moves normally and symmetrically.  Motor:  Normal bulk and tone. Normal strength in all tested extremity muscles. Mild left grip weakness. Diminished fine finger movements on the left. Orbits right over left upper extremity. Mild increased tone in the left leg with stiffness      Assessment/Plan:   I had a long discussion with the patient and wife regarding pain and taking tramadol only as needed. Pain requesting something stronger, do not recommend oxy and discussed reasons why. Use tramadol sparingly, and only as needed for severe pain, can try tylenol. Discussed compliance with medication. Will refill Topamax as this can help with pain as well as paresthesias. neurontin is also helpful. He still has vision loss, this is a fall risk, no driving, be careful while walking to avoid injuries and falls due to his peripheral vision loss.   Gabapentin 300 mg at night for arm pain. If needed, you may take this medication up to three times a day. Side effects discussed of neurontin, tramadol and topamax. Maintain strict control of hypertension with blood pressure goal below 130/90.Follow up  with pcp Return for followup in 6 months with Dr. Leonie Man.  Patient's headaches resolved. Patient to follow up with Dr. Leonie Man.   Sarina Ill, MD  [facility] [address] [place], [place] [address]  Phone [phone] Fax [phone]  A total of 15 minutes was spent face-to-face with this patient. Over half this time was spent on counseling patient on the intracerebral hemorrhage, headache diagnosis and different diagnostic and therapeutic options available.

## 2014-12-13 NOTE — Patient Instructions (Signed)
Overall you are doing fairly well but I do want to suggest a few things today:   Remember to drink plenty of fluid, eat healthy meals and do not skip any meals. Try to eat protein with a every meal and eat a healthy snack such as fruit or nuts in between meals. Try to keep a regular sleep-wake schedule and try to exercise daily, particularly in the form of walking, 20-30 minutes a day, if you can.   As far as your medications are concerned, I would like to suggest: As prescribed  I would like to see you back in 6 months with Dr. Leonie Man, sooner if we need to. Please call us with any interim questions, concerns, problems, updates or refill requests.   Please also call us for any test results so we can go over those with you on the phone.  My clinical assistant and will answer any of your questions and relay your messages to me and also relay most of my messages to you.   Our phone number is [phone]. We also have an after hours call service for urgent matters and there is a physician on-call for urgent questions. For any emergencies you know to call 911 or go to the nearest emergency room

## 2015-02-10 ENCOUNTER — Telehealth: Payer: Self-pay | Admitting: Family Medicine

## 2015-02-10 NOTE — Telephone Encounter (Signed)
Unfortunately, I'm not able to accept any more new patients at this time.  I'm sorry! Thank you!  

## 2015-02-10 NOTE — Telephone Encounter (Signed)
Patients pcp has retired and is wondering if you can take him as a patient

## 2015-05-03 ENCOUNTER — Encounter: Payer: Self-pay | Admitting: Gastroenterology

## 2015-06-15 ENCOUNTER — Ambulatory Visit (INDEPENDENT_AMBULATORY_CARE_PROVIDER_SITE_OTHER): Payer: Medicare Other | Admitting: Neurology

## 2015-06-15 ENCOUNTER — Encounter: Payer: Self-pay | Admitting: Neurology

## 2015-06-15 VITALS — BP 148/80 | HR 72 | Wt 159.5 lb

## 2015-06-15 DIAGNOSIS — I6990 Unspecified sequelae of unspecified cerebrovascular disease: Secondary | ICD-10-CM

## 2015-06-15 MED ORDER — TRAMADOL HCL 50 MG PO TABS: 100.0000 mg | ORAL_TABLET | Freq: Three times a day (TID) | ORAL | Status: DC | PRN

## 2015-06-15 NOTE — Patient Instructions (Signed)
I had a long d/w patient about his remote brain hemorrhage, headaches, risk for recurrent stroke/TIAs, personally independently reviewed imaging studies and stroke evaluation results and answered questions.Continue  to maintain strict control of hypertension with blood pressure goal below 130/90, diabetes with hemoglobin A1c goal below 6.5% and lipids with LDL cholesterol goal below 100 mg/dL.  continue Topamax 50 mg twice daily for headache prevention and tramadol 100 mg every 8 hourly as needed. Patient was given a refill for tramadol. Followup in the future with me in one year or call earlier if necessary

## 2015-06-15 NOTE — Progress Notes (Signed)
PATIENT: Eric [name] DOB: 1941/03/13  REASON FOR VISIT: routine follow up for headache HISTORY FROM: patient, daughter  HISTORY OF PRESENT ILLNESS: Eric [name] is a 62 year african american male seen today for followup visit for ICH, post-stroke headaches.  UPDATE 06/13/14 (LL): He returns for stroke follow up.  He states his headaches have persisted, but Tramadol does help if he takes 2. He feels his vision is much improved and he has adapted to his peripheral vision loss. Left arm pain persists and is most bothersome at night. Gabapentin does help but he does not often take this during the day. He did not find any benefit from Topamax for headaches and stopped it on his own.  He does not drive anymore. His daughter is concerned that he has to clear his throat often, but he states that he does not get choked on foods or drinks. He states his blood pressure is well controlled although it is elevated in the office today, 151/85. He has no new complaints.  UPDATE 12/10/13 (LL): Eric. [name] returns for stroke follow up. He states he feels good and his headaches are getting better. He still has left peripheral vision deficit. His speech is slurred only when he is very tired. His strength has returned but complains of left arm pain at times, mostly at night. His BP is well controlled, it is 113/67 in the office today.  On 07/02/13, he presented to ER with sudden onset of slurred speech, left facial weakness and left hemiparesthesias as well as right frontotemporal headache. CT scan of the head showed a 4 cm right temporoparietal parenchymal hemorrhage with surrounding edema and mass effect. NIH stroke scale was 14. He was admitted to the intensive care unit and blood pressure tightly controlled. Admission blood pressure was elevated at 184/100. Followed imaging study showed stable appearance of the hemorrhage. MRI scan the brain confirmed the parenchyma right temporoparietal hemorrhage with volume  of 28 cubic cc. MRI of the brain showed no large vessel occlusion. Transthoracic echo showed normal ejection fraction. Carotid ultrasound showed no stenosis. EKG showed regular sinus rhythm. Urine drug screen was negative. Vascular risk factors identified included mild hyperlipidemia with LDL of 104. Patient had persistent headache and was treated with Demerol. He had left homonymous and a hemianopsia and mild left-sided weakness and sensory loss. Clinical status improved he was discharged home. He states his done well his speech and left-sided weakness and numbness is a lot better. Headaches improved but are still persistent particularly at night he has right frontal and periorbital headache which usually responds to come about but at times it is quite disabling yet. He has occasional slurred speech off and on. She states his blood pressure has been quite well controlled at home. He has an appointment with his family physician Dr. Tawanna Cooler next month. He admits to mild short-term memory difficulties but these are not bothersome. He had followup CT scan of the head done on 07/23/13 which showed expectoration or he change in the hemorrhage with mild persistent edema and mass effect.  Update 06/15/2015 : He returns for follow-up after last visit 6 months ago. He states improvement in his headaches with Topamax and tramadol. Is tolerating Topamax 50 mg twice daily without side effects and takes tramadol as needed. His headache frequency is now once or twice a week and then not as disabling. He also takes gabapentin which helps his arm pain. States his blood pressure is usually well controlled though it is  slightly elevated in office today. Patient continues to have some mild vision difficulties from his remote brain hemorrhage now more than 2 years ago. He has no new complaints. ROS:  14 system review of systems is positive for headache, excessive sweating, increased thirst, joint pain, back pain and speech difficulty  and all other systems negative  ALLERGIES: No Known Allergies  HOME MEDICATIONS: Outpatient Prescriptions Prior to Visit  Medication Sig Dispense Refill  . acetaminophen (TYLENOL) 325 MG tablet Take 2 tablets (650 mg total) by mouth every 4 (four) hours as needed for mild pain. 30 tablet 2  . gabapentin (NEURONTIN) 300 MG capsule Take 1 capsule (300 mg total) by mouth 3 (three) times daily. 90 capsule 11  . hydrochlorothiazide (HYDRODIURIL) 12.5 MG tablet Take 1 tablet (12.5 mg total) by mouth daily. 90 tablet 3  . hydrochlorothiazide (MICROZIDE) 12.5 MG capsule TAKE ONE CAPSULE BY MOUTH EVERY DAY 90 capsule 2  . quinapril (ACCUPRIL) 20 MG tablet TAKE 1 TABLET (20 MG TOTAL) BY MOUTH EVERY MORNING. 100 tablet 3  . topiramate (TOPAMAX) 50 MG tablet Take 1 tablet (50 mg total) by mouth 2 (two) times daily. 60 tablet 6  . traMADol (ULTRAM) 50 MG tablet Take 2 tablets (100 mg total) by mouth every 8 (eight) hours as needed. 180 tablet 5   No facility-administered medications prior to visit.     Filed Vitals:   06/15/15 1518  BP: 148/80  Pulse: 72  Weight: 159 lb 8 oz (72.349 kg)   Body mass index is 23.54 kg/(m^2).  Physical Exam  General: well developed, well nourished elderly African American male, seated, in no evident distress  Head: head normocephalic and atraumatic. Orohparynx benign  Neck: supple with no carotid or supraclavicular bruits  Cardiovascular: regular rate and rhythm, no murmurs  Musculoskeletal: no deformity  Skin: no rash/petichiae  Vascular: Normal pulses all extremities   Neurologic Exam  Mental Status: Awake and fully alert. Oriented to place and time. Recent and remote memory intact. Diminished recall 2/3 Attention span, concentration and fund of knowledge appropriate. Mood and affect appropriate.  Cranial Nerves: Pupils equal, briskly reactive to light. Extraocular movements full without nystagmus. Visual fields showed partial left homonymous hemianopsia to  confrontation. Hearing intact. Facial sensation intact. Face, tongue, palate moves normally and symmetrically.  Motor: Normal bulk and tone. Normal strength in all tested extremity muscles. Mild left grip weakness. Diminished fine finger movements on the left. Orbits right over left upper extremity. Mild increased tone in the left leg with stiffness  Sensory: intact to touch and pinprick and vibratory sensation.  Coordination: Rapid alternating movements normal in all extremities. Finger-to-nose and heel-to-shin performed accurately bilaterally.  Gait and Station: Arises from chair without difficulty. Stance is normal. Gait demonstrates normal stride length and balance . Able to heel, toe and tandem walk without difficulty.  Reflexes: 1+ and symmetric. Toes downgoing.    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 Clinical doing well but has persistent right frontal headaches, and left hemianopsia. Topamax  Has been effective as a headache preventative   Tramadol has been effective for symptomatic relief ofheadache and arm pain    PLAN:  II had a long d/w patient about his remote brain hemorrhage, headaches, risk for recurrent stroke/TIAs, personally independently reviewed imaging studies and stroke evaluation results and answered questions.Continue  to maintain strict control of hypertension with blood pressure goal below 130/90,  diabetes with hemoglobin A1c goal below 6.5% and lipids with LDL cholesterol goal below 100 mg/dL.  continue Topamax 50 mg twice daily for headache prevention and tramadol 100 mg every 8 hourly as needed. Patient was given a refill for tramadol. Followup in the future with me in one year or call earlier if necessary   Meds ordered this encounter  Medications  . traMADol (ULTRAM) 50 MG tablet    Sig: Take 2 tablets (100 mg total) by  mouth every 8 (eight) hours as needed.    Dispense:  180 tablet    Refill:  5   Return in about 1 year (around 06/14/2016).  Delia Heady, MD  06/15/2015, 4:22 PM [facility] [address] [place], [place] [address] [phone]  Note: This document was prepared with digital dictation and possible smart phrase technology. Any transcriptional errors that result from this process are unintentional.

## 2015-07-10 ENCOUNTER — Encounter: Payer: Self-pay | Admitting: Gastroenterology

## 2015-08-04 ENCOUNTER — Encounter: Payer: Self-pay | Admitting: Gastroenterology

## 2015-08-14 ENCOUNTER — Other Ambulatory Visit: Payer: Self-pay | Admitting: Family Medicine

## 2015-08-15 ENCOUNTER — Other Ambulatory Visit: Payer: Self-pay | Admitting: Neurology

## 2015-08-15 ENCOUNTER — Encounter: Payer: Self-pay | Admitting: Family Medicine

## 2015-08-15 ENCOUNTER — Ambulatory Visit (INDEPENDENT_AMBULATORY_CARE_PROVIDER_SITE_OTHER): Payer: Medicare Other | Admitting: Family Medicine

## 2015-08-15 VITALS — BP 120/80 | Temp 98.4°F | Ht 69.0 in | Wt 159.0 lb

## 2015-08-15 DIAGNOSIS — H2513 Age-related nuclear cataract, bilateral: Secondary | ICD-10-CM | POA: Diagnosis not present

## 2015-08-15 DIAGNOSIS — I10 Essential (primary) hypertension: Secondary | ICD-10-CM | POA: Diagnosis not present

## 2015-08-15 DIAGNOSIS — N401 Enlarged prostate with lower urinary tract symptoms: Secondary | ICD-10-CM | POA: Diagnosis not present

## 2015-08-15 DIAGNOSIS — Z0000 Encounter for general adult medical examination without abnormal findings: Secondary | ICD-10-CM

## 2015-08-15 DIAGNOSIS — I615 Nontraumatic intracerebral hemorrhage, intraventricular: Secondary | ICD-10-CM

## 2015-08-15 DIAGNOSIS — R351 Nocturia: Secondary | ICD-10-CM

## 2015-08-15 DIAGNOSIS — I611 Nontraumatic intracerebral hemorrhage in hemisphere, cortical: Secondary | ICD-10-CM

## 2015-08-15 DIAGNOSIS — Z23 Encounter for immunization: Secondary | ICD-10-CM

## 2015-08-15 LAB — CBC WITH DIFFERENTIAL/PLATELET
BASOS PCT: 0.3 % (ref 0.0–3.0)
Basophils Absolute: 0 10*3/uL (ref 0.0–0.1)
EOS PCT: 0.6 % (ref 0.0–5.0)
Eosinophils Absolute: 0 10*3/uL (ref 0.0–0.7)
HCT: 42.8 % (ref 39.0–52.0)
Hemoglobin: 13.8 g/dL (ref 13.0–17.0)
LYMPHS ABS: 2.2 10*3/uL (ref 0.7–4.0)
Lymphocytes Relative: 27.1 % (ref 12.0–46.0)
MCHC: 32.3 g/dL (ref 30.0–36.0)
MCV: 85.8 fl (ref 78.0–100.0)
MONO ABS: 0.6 10*3/uL (ref 0.1–1.0)
MONOS PCT: 6.9 % (ref 3.0–12.0)
NEUTROS PCT: 65.1 % (ref 43.0–77.0)
Neutro Abs: 5.3 10*3/uL (ref 1.4–7.7)
Platelets: 323 10*3/uL (ref 150.0–400.0)
RBC: 4.98 Mil/uL (ref 4.22–5.81)
RDW: 14.6 % (ref 11.5–15.5)
WBC: 8.1 10*3/uL (ref 4.0–10.5)

## 2015-08-15 LAB — POCT URINALYSIS DIPSTICK
BILIRUBIN UA: NEGATIVE
GLUCOSE UA: NEGATIVE
KETONES UA: NEGATIVE
LEUKOCYTES UA: NEGATIVE
NITRITE UA: NEGATIVE
PH UA: 7.5
Protein, UA: NEGATIVE
RBC UA: NEGATIVE
Spec Grav, UA: 1.015
Urobilinogen, UA: 1

## 2015-08-15 LAB — BASIC METABOLIC PANEL
BUN: 10 mg/dL (ref 6–23)
CO2: 31 meq/L (ref 19–32)
Calcium: 9.5 mg/dL (ref 8.4–10.5)
Chloride: 97 mEq/L (ref 96–112)
Creatinine, Ser: 0.94 mg/dL (ref 0.40–1.50)
GFR: 100.7 mL/min (ref >60.00)
GLUCOSE: 79 mg/dL (ref 70–99)
POTASSIUM: 3.7 meq/L (ref 3.5–5.1)
SODIUM: 139 meq/L (ref 135–145)

## 2015-08-15 LAB — PSA: PSA: 0.88 ng/mL (ref 0.10–4.00)

## 2015-08-15 LAB — TSH: TSH: 1.73 u[IU]/mL (ref 0.35–4.50)

## 2015-08-15 MED ORDER — QUINAPRIL HCL 20 MG PO TABS: 20.0000 mg | ORAL_TABLET | Freq: Every morning | ORAL | Status: DC

## 2015-08-15 MED ORDER — HYDROCHLOROTHIAZIDE 12.5 MG PO TABS: 12.5000 mg | ORAL_TABLET | Freq: Every day | ORAL | Status: DC

## 2015-08-15 NOTE — Progress Notes (Signed)
Subjective:    Patient ID: Eric [name], male    DOB: 1941/06/15, 74 y.o.   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  On Accupril 20 mg daily Hydrocort thiazide 12.5 mg daily his blood pressures 120/80  He takes Neurontin 3 mg 3 times daily and Topamax 50 mg twice a day to prevent seizures  He's followed by neurology  He gets routine eye care, no dental care, colonoscopy 2011 was normal.  Vaccinations up-to-date except he needs a pneumonia 13. This will be given today  Cognitive function normal, he does not exercise on a regular basis home health safety reviewed no issues identified, no guns in the house, he does not have a healthcare power of attorney nor living well   Review of Systems  Constitutional: Negative.   HENT: Negative.   Eyes: Negative.   Respiratory: Negative.   Cardiovascular: Negative.   Gastrointestinal: Negative.   Endocrine: Negative.   Genitourinary: Negative.   Musculoskeletal: Negative.   Skin: Negative.   Allergic/Immunologic: Negative.   Neurological: Negative.   Hematological: Negative.   Psychiatric/Behavioral: Negative.        Objective:   Physical Exam  Constitutional: He is oriented to person, place, and time. He appears well-developed and well-nourished.  HENT:  Head: Normocephalic and atraumatic.  Right Ear: External ear normal.  Left Ear: External ear normal.  Nose: Nose normal.  Mouth/Throat: Oropharynx is clear and moist.  Eyes: Conjunctivae and EOM are normal. Pupils are equal, round, and reactive to light.  Neck: Normal range of motion. Neck supple. No JVD present. No tracheal deviation present. No thyromegaly present.  Cardiovascular: Normal rate, regular rhythm, normal heart sounds and intact distal pulses.  Exam reveals no gallop  and no friction rub.   No murmur heard. No carotid nor aortic bruits peripheral pulses 1+ and symmetrical  Pulmonary/Chest: Effort normal and breath sounds normal. No stridor. No respiratory distress. He has no wheezes. He has no rales. He exhibits no tenderness.  Abdominal: Soft. Bowel sounds are normal. He exhibits no distension and no mass. There is no tenderness. There is no rebound and no guarding.  Genitourinary: Rectum normal and penis normal. Guaiac negative stool. No penile tenderness.  2+ symmetrical nonnodular BPH  Musculoskeletal: Normal range of motion. He exhibits no edema or tenderness.  Lymphadenopathy:    He has no cervical adenopathy.  Neurological: He is alert and oriented to person, place, and time. He has normal reflexes. No cranial nerve deficit. He exhibits normal muscle tone.  Skin: Skin is warm and dry. No rash noted. No erythema. No pallor.  Psychiatric: He has a normal mood and affect. His behavior is normal. Judgment and thought content normal.          Assessment & Plan:  Hypertension at goal.......... continue current therapy  History of stroke........ continue current therapy.

## 2015-08-15 NOTE — Patient Instructions (Signed)
Continue current medications  Follow-up in one year sooner if any problems  When you call in August for your physical in December,,,,,,,,,,,,,,,, Tommi Rumps or Almyra Free are 2 new nurse practitioners or Dr. Martinique

## 2015-08-15 NOTE — Progress Notes (Signed)
Pre visit review using our clinic review tool, if applicable. No additional management support is needed unless otherwise documented below in the visit note. 

## 2015-08-21 ENCOUNTER — Other Ambulatory Visit: Payer: Self-pay | Admitting: Family Medicine

## 2015-09-26 ENCOUNTER — Ambulatory Visit (AMBULATORY_SURGERY_CENTER): Payer: Self-pay

## 2015-09-26 VITALS — Ht 69.0 in | Wt 159.0 lb

## 2015-09-26 DIAGNOSIS — Z86010 Personal history of colonic polyps: Secondary | ICD-10-CM

## 2015-09-26 MED ORDER — NA SULFATE-K SULFATE-MG SULF 17.5-3.13-1.6 GM/177ML PO SOLN: ORAL | Status: DC

## 2015-09-26 NOTE — Progress Notes (Signed)
Per pt, no allergies to soy or egg products.Pt not taking any weight loss meds or using  O2 at home.   Pt and his wife came into the office today for the pre-visit prior to the colonoscopy on 10/10/15 with Dr Ardis Hughs.The pt had difficulty reading a lot of the paperwork and signing his name.I read the consent form to the pt and wife and answered all their questions.They will call back if they have further questions.

## 2015-10-10 ENCOUNTER — Ambulatory Visit (AMBULATORY_SURGERY_CENTER): Payer: Medicare Other | Admitting: Gastroenterology

## 2015-10-10 ENCOUNTER — Encounter: Payer: Self-pay | Admitting: Gastroenterology

## 2015-10-10 VITALS — BP 151/64 | HR 88 | Temp 97.4°F | Resp 18 | Ht 69.0 in | Wt 159.0 lb

## 2015-10-10 DIAGNOSIS — D125 Benign neoplasm of sigmoid colon: Secondary | ICD-10-CM | POA: Diagnosis not present

## 2015-10-10 DIAGNOSIS — Z86010 Personal history of colonic polyps: Secondary | ICD-10-CM

## 2015-10-10 DIAGNOSIS — D122 Benign neoplasm of ascending colon: Secondary | ICD-10-CM

## 2015-10-10 MED ORDER — SODIUM CHLORIDE 0.9 % IV SOLN: 500.0000 mL | INTRAVENOUS | Status: DC

## 2015-10-10 NOTE — Progress Notes (Signed)
Called to room to assist during endoscopic procedure.  Patient ID and intended procedure confirmed with present staff. Received instructions for my participation in the procedure from the performing physician.  

## 2015-10-10 NOTE — Op Note (Signed)
[place]  [address] [place], [address]   COLONOSCOPY PROCEDURE REPORT  PATIENT: Eric [name], Eric [name]  MR#: [id number] BIRTHDATE: 1940-09-23 , 74  yrs. old GENDER: male ENDOSCOPIST: Milus Banister, MD PROCEDURE DATE:  10/10/2015 PROCEDURE:   Colonoscopy, surveillance and Colonoscopy with snare polypectomy First Screening Colonoscopy - Avg.  risk and is 50 yrs.  old or older - No.  Prior Negative Screening - Now for repeat screening. N/A  History of Adenoma - Now for follow-up colonoscopy & has been > or = to 3 yrs.  Yes hx of adenoma.  Has been 3 or more years since last colonoscopy.  Polyps removed today? Yes ASA CLASS:   Class II INDICATIONS:Surveillance due to prior colonic neoplasia and 2006 colonoscopy 1 subCM TA, 2011 colonoscopy two subCM adenomas. MEDICATIONS: Monitored anesthesia care, Propofol 150 mg IV, and lidocaine 140mg  IV  DESCRIPTION OF PROCEDURE:   After the risks benefits and alternatives of the procedure were thoroughly explained, informed consent was obtained.  The digital rectal exam revealed no abnormalities of the rectum.   The LB CF-H180AL Loaner [id number] endoscope was introduced through the anus and advanced to the cecum, which was identified by both the appendix and ileocecal valve. No adverse events experienced.   The quality of the prep was good.  The instrument was then slowly withdrawn as the colon was fully examined. Estimated blood loss is zero unless otherwise noted in this procedure report.  COLON FINDINGS: Two sessile polyps were found, removed and sent to pathology.  These were 3-28mm across, located in ascending and sigmoid segments, removed with cold snare and sent to pathology. The examination was otherwise normal.  Retroflexed views revealed no abnormalities. The time to cecum = 1.8 Withdrawal time = 6.8 The scope was withdrawn and the procedure completed. COMPLICATIONS: There were no immediate  complications.  ENDOSCOPIC IMPRESSION: Two sessile polyps were found, removed and sent to pathology. The examination was otherwise normal  RECOMMENDATIONS: If the polyp(s) removed today are proven to be adenomatous (pre-cancerous) polyps, you will need a repeat colonoscopy in 5 years.  Otherwise you should continue to follow colorectal cancer screening guidelines for "routine risk" patients with colonoscopy in 10 years.  You will receive a letter within 1-2 weeks with the results of your biopsy as well as final recommendations.  Please call my office if you have not received a letter after 3 weeks.  eSigned:  Milus Banister, MD 10/10/2015 9:31 AM   cc: Stevie Kern, MD

## 2015-10-10 NOTE — Patient Instructions (Signed)
YOU HAD AN ENDOSCOPIC PROCEDURE TODAY AT THE [facility]:   Refer to the procedure report that was given to you for any specific questions about what was found during the examination.  If the procedure report does not answer your questions, please call your gastroenterologist to clarify.  If you requested that your care partner not be given the details of your procedure findings, then the procedure report has been included in a sealed envelope for you to review at your convenience later.  YOU SHOULD EXPECT: Some feelings of bloating in the abdomen. Passage of more gas than usual.  Walking can help get rid of the air that was put into your GI tract during the procedure and reduce the bloating. If you had a lower endoscopy (such as a colonoscopy or flexible sigmoidoscopy) you may notice spotting of blood in your stool or on the toilet paper. If you underwent a bowel prep for your procedure, you may not have a normal bowel movement for a few days.  Please Note:  You might notice some irritation and congestion in your nose or some drainage.  This is from the oxygen used during your procedure.  There is no need for concern and it should clear up in a day or so.  SYMPTOMS TO REPORT IMMEDIATELY:   Following lower endoscopy (colonoscopy or flexible sigmoidoscopy):  Excessive amounts of blood in the stool  Significant tenderness or worsening of abdominal pains  Swelling of the abdomen that is new, acute  Fever of 100F or higher  For urgent or emergent issues, a gastroenterologist can be reached at any hour by calling [phone].  DIET: Your first meal following the procedure should be a small meal and then it is ok to progress to your normal diet. Heavy or fried foods are harder to digest and may make you feel nauseous or bloated.  Likewise, meals heavy in dairy and vegetables can increase bloating.  Drink plenty of fluids but you should avoid alcoholic beverages for 24 hours.  ACTIVITY:   You should plan to take it easy for the rest of today and you should NOT DRIVE or use heavy machinery until tomorrow (because of the sedation medicines used during the test).    FOLLOW UP: Our staff will call the number listed on your records the next business day following your procedure to check on you and address any questions or concerns that you may have regarding the information given to you following your procedure. If we do not reach you, we will leave a message.  However, if you are feeling well and you are not experiencing any problems, there is no need to return our call.  We will assume that you have returned to your regular daily activities without incident.  If any biopsies were taken you will be contacted by phone or by letter within the next 1-3 weeks.  Please call us at [phone] if you have not heard about the biopsies in 3 weeks.    SIGNATURES/CONFIDENTIALITY: You and/or your care partner have signed paperwork which will be entered into your electronic medical record.  These signatures attest to the fact that that the information above on your After Visit Summary has been reviewed and is understood.  Full responsibility of the confidentiality of this discharge information lies with you and/or your care-partner.  Please read over handout about polyps  Please continue your normal medications   

## 2015-10-10 NOTE — Progress Notes (Signed)
Report to PACU, RN, vss, BBS= Clear.  

## 2015-10-11 ENCOUNTER — Telehealth: Payer: Self-pay

## 2015-10-11 NOTE — Telephone Encounter (Signed)
The pt's wife answered the phone and said, "He is not here".  I identified myself, and asked if she could please let Eric [name] know I called.  And to tell him to call us back if any questions or concerns. She said she would. maw

## 2015-10-24 ENCOUNTER — Encounter: Payer: Self-pay | Admitting: Gastroenterology

## 2015-11-08 ENCOUNTER — Encounter (HOSPITAL_COMMUNITY): Payer: Self-pay | Admitting: Emergency Medicine

## 2015-11-08 ENCOUNTER — Emergency Department (HOSPITAL_COMMUNITY): Payer: Medicare Other

## 2015-11-08 DIAGNOSIS — Z8659 Personal history of other mental and behavioral disorders: Secondary | ICD-10-CM | POA: Diagnosis not present

## 2015-11-08 DIAGNOSIS — R42 Dizziness and giddiness: Secondary | ICD-10-CM | POA: Insufficient documentation

## 2015-11-08 DIAGNOSIS — I10 Essential (primary) hypertension: Secondary | ICD-10-CM | POA: Insufficient documentation

## 2015-11-08 DIAGNOSIS — R0789 Other chest pain: Secondary | ICD-10-CM | POA: Diagnosis not present

## 2015-11-08 DIAGNOSIS — H538 Other visual disturbances: Secondary | ICD-10-CM | POA: Insufficient documentation

## 2015-11-08 DIAGNOSIS — R0602 Shortness of breath: Secondary | ICD-10-CM | POA: Diagnosis not present

## 2015-11-08 DIAGNOSIS — J449 Chronic obstructive pulmonary disease, unspecified: Secondary | ICD-10-CM | POA: Insufficient documentation

## 2015-11-08 DIAGNOSIS — Z87438 Personal history of other diseases of male genital organs: Secondary | ICD-10-CM | POA: Insufficient documentation

## 2015-11-08 DIAGNOSIS — Z87891 Personal history of nicotine dependence: Secondary | ICD-10-CM | POA: Diagnosis not present

## 2015-11-08 DIAGNOSIS — Z8673 Personal history of transient ischemic attack (TIA), and cerebral infarction without residual deficits: Secondary | ICD-10-CM | POA: Diagnosis not present

## 2015-11-08 DIAGNOSIS — R079 Chest pain, unspecified: Secondary | ICD-10-CM | POA: Diagnosis not present

## 2015-11-08 LAB — CBC
HCT: 38.6 % — ABNORMAL LOW (ref 39.0–52.0)
HEMOGLOBIN: 13.2 g/dL (ref 13.0–17.0)
MCH: 28.4 pg (ref 26.0–34.0)
MCHC: 34.2 g/dL (ref 30.0–36.0)
MCV: 83 fL (ref 78.0–100.0)
PLATELETS: 312 10*3/uL (ref 150–400)
RBC: 4.65 MIL/uL (ref 4.22–5.81)
RDW: 13.6 % (ref 11.5–15.5)
WBC: 8 10*3/uL (ref 4.0–10.5)

## 2015-11-08 LAB — I-STAT TROPONIN, ED: TROPONIN I, POC: 0 ng/mL (ref 0.00–0.08)

## 2015-11-08 LAB — BASIC METABOLIC PANEL
ANION GAP: 10 (ref 5–15)
BUN: 14 mg/dL (ref 6–20)
CALCIUM: 9.4 mg/dL (ref 8.9–10.3)
CO2: 25 mmol/L (ref 22–32)
CREATININE: 1.06 mg/dL (ref 0.61–1.24)
Chloride: 105 mmol/L (ref 101–111)
GLUCOSE: 94 mg/dL (ref 65–99)
Potassium: 3.1 mmol/L — ABNORMAL LOW (ref 3.5–5.1)
Sodium: 140 mmol/L (ref 135–145)

## 2015-11-08 NOTE — ED Notes (Signed)
Pt reports dizziness and feeling off balance since yesterday. Also reports R arm tingling, and intermittent chest tightness and sob since yesterday. Hx of previous stroke- no deficients. No drift or droop noted.

## 2015-11-09 ENCOUNTER — Emergency Department (HOSPITAL_COMMUNITY)
Admission: EM | Admit: 2015-11-09 | Discharge: 2015-11-09 | Disposition: A | Discharge status: Home / Self Care | Payer: Medicare Other | Attending: Emergency Medicine | Admitting: Emergency Medicine

## 2015-11-09 ENCOUNTER — Emergency Department (HOSPITAL_COMMUNITY): Payer: Medicare Other

## 2015-11-09 DIAGNOSIS — H538 Other visual disturbances: Secondary | ICD-10-CM | POA: Diagnosis not present

## 2015-11-09 DIAGNOSIS — R079 Chest pain, unspecified: Secondary | ICD-10-CM

## 2015-11-09 DIAGNOSIS — R42 Dizziness and giddiness: Secondary | ICD-10-CM | POA: Diagnosis not present

## 2015-11-09 LAB — URINALYSIS, ROUTINE W REFLEX MICROSCOPIC
Bilirubin Urine: NEGATIVE
Glucose, UA: NEGATIVE mg/dL
HGB URINE DIPSTICK: NEGATIVE
KETONES UR: NEGATIVE mg/dL
Leukocytes, UA: NEGATIVE
Nitrite: NEGATIVE
PROTEIN: NEGATIVE mg/dL
Specific Gravity, Urine: 1.013 (ref 1.005–1.030)
pH: 5.5 (ref 5.0–8.0)

## 2015-11-09 LAB — I-STAT TROPONIN, ED: TROPONIN I, POC: 0 ng/mL (ref 0.00–0.08)

## 2015-11-09 MED ORDER — LIDOCAINE 5 % EX OINT: 1.0000 "application " | TOPICAL_OINTMENT | Freq: Three times a day (TID) | CUTANEOUS | Status: DC | PRN

## 2015-11-09 MED ORDER — LORAZEPAM 1 MG PO TABS
0.5000 mg | ORAL_TABLET | Freq: Once | ORAL | Status: AC
  Administered 2015-11-09: 0.5 mg via ORAL
  Filled 2015-11-09: qty 1

## 2015-11-09 NOTE — ED Notes (Signed)
Pt otf to MRI. Family present in room.

## 2015-11-09 NOTE — ED Provider Notes (Signed)
CSN: [id number]     Arrival date & time 11/08/15  2128 History    By signing my name below, I, Forrestine Him, attest that this documentation has been prepared under the direction and in the presence of Orpah Greek, MD.  Electronically Signed: Forrestine Him, ED Scribe. 11/09/2015. 2:48 AM.   Chief Complaint  Patient presents with  . Dizziness   The history is provided by the patient. No language interpreter was used.    HPI Comments: Eric [name] is a 75 y.o. male with a PMHx of stroke and HTN who presents to the Emergency Department complaining of intermittent, ongoing dizziness onset yesterday. Pt describes dizziness as feeling off balance. He states "when i get up i feel like i am going to hit the floor". Pt also reports blurred vision and tight feeling chest pain. However, currently he is chest pain free. No aggravating or alleviating factors reported. No OTC medications or home remedies attempted prior to arrival. No recent fever, chills, nausea, vomiting, abdominal pain, or shortness of breath. No known allergies to medications.  PCP: Joycelyn Man, MD    Past Medical History  Diagnosis Date  . COPD (chronic obstructive pulmonary disease) ([place])     in past  . Hypertension   . Benign prostatic hypertrophy   . ED (erectile dysfunction)   . Gynecomastia, male     bilateral breast removal  . Stroke ([place]) 06/2014  . Difficulty reading     and writing   Past Surgical History  Procedure Laterality Date  . Bilateral breast removal    . Colonoscopy  04/24/05   Family History  Problem Relation Age of Onset  . Diabetes Other   . Hypertension Other   . Arthritis Mother   . Diabetes Father    Social History  Substance Use Topics  . Smoking status: Former Smoker    Quit date: 09/22/1999  . Smokeless tobacco: Never Used  . Alcohol Use: No    Review of Systems  Constitutional: Negative for fever and chills.  Respiratory: Negative for cough and shortness of  breath.   Cardiovascular: Positive for chest pain.  Gastrointestinal: Negative for nausea, vomiting and abdominal pain.  Neurological: Positive for dizziness. Negative for headaches.  Psychiatric/Behavioral: Negative for confusion.  All other systems reviewed and are negative.     Allergies  Review of patient's allergies indicates no known allergies.  Home Medications   Prior to Admission medications   Medication Sig Start Date End Date Taking? Authorizing Provider  acetaminophen (TYLENOL) 325 MG tablet Take 2 tablets (650 mg total) by mouth every 4 (four) hours as needed for mild pain. Patient not taking: Reported on 09/26/2015 07/06/13   Donzetta Starch, NP  gabapentin (NEURONTIN) 300 MG capsule Take 1 capsule (300 mg total) by mouth 3 (three) times daily. Patient not taking: Reported on 09/26/2015 12/13/14   Melvenia Beam, MD  hydrochlorothiazide (HYDRODIURIL) 12.5 MG tablet TAKE 1 TABLET EVERY DAY 08/21/15   Dorena Cookey, MD  Naproxen Sodium (ALEVE PO) Take by mouth as needed.    Historical Provider, MD  quinapril (ACCUPRIL) 20 MG tablet Take 1 tablet (20 mg total) by mouth every morning. 08/15/15   Dorena Cookey, MD  topiramate (TOPAMAX) 50 MG tablet TAKE 1 TABLET (50 MG TOTAL) BY MOUTH 2 (TWO) TIMES DAILY. Patient not taking: Reported on 10/10/2015 08/15/15   Melvenia Beam, MD  traMADol (ULTRAM) 50 MG tablet Take 2 tablets (100 mg total) by mouth  every 8 (eight) hours as needed. 06/15/15   Garvin Fila, MD   Triage Vitals: BP 160/76 mmHg  Pulse 62  Temp(Src) 98.1 F (36.7 C) (Oral)  Resp 18  Ht 5\' 8"  (1.727 m)  Wt 158 lb (71.668 kg)  BMI 24.03 kg/m2  SpO2 98%   Physical Exam  Constitutional: He is oriented to person, place, and time. He appears well-developed and well-nourished. No distress.  HENT:  Head: Normocephalic and atraumatic.  Right Ear: Hearing normal.  Left Ear: Hearing normal.  Nose: Nose normal.  Mouth/Throat: Oropharynx is clear and moist and mucous  membranes are normal.  Eyes: Conjunctivae and EOM are normal. Pupils are equal, round, and reactive to light.  Neck: Normal range of motion. Neck supple.  Cardiovascular: Regular rhythm, S1 normal and S2 normal.  Exam reveals no gallop and no friction rub.   No murmur heard. Pulmonary/Chest: Effort normal and breath sounds normal. No respiratory distress. He exhibits no tenderness.  Abdominal: Soft. Normal appearance and bowel sounds are normal. There is no hepatosplenomegaly. There is no tenderness. There is no rebound, no guarding, no tenderness at McBurney's point and negative Murphy's sign. No hernia.  Musculoskeletal: Normal range of motion.  Neurological: He is alert and oriented to person, place, and time. He has normal strength. No cranial nerve deficit or sensory deficit. Coordination normal. GCS eye subscore is 4. GCS verbal subscore is 5. GCS motor subscore is 6.  Extraocular muscle movement: normal No visual field cut Pupils: equal and reactive both direct and consensual response is normal No nystagmus present    Sensory function is intact to light touch, pinprick Proprioception intact  Grip strength 5/5 symmetric in upper extremities No pronator drift Normal finger to nose bilaterally  Lower extremity strength 5/5 against gravity Normal heel to shin bilaterally  Rhomberg: Normal   Skin: Skin is warm, dry and intact. No rash noted. No cyanosis.  Psychiatric: He has a normal mood and affect. His speech is normal and behavior is normal. Thought content normal.  Nursing note and vitals reviewed.   ED Course  Procedures (including critical care time)  DIAGNOSTIC STUDIES: Oxygen Saturation is 98% on RA, Normal by my interpretation.    COORDINATION OF CARE: 2:43 AM- Will order blood work, imaging, urinalysis, and EKG. Discussed treatment plan with pt at bedside and pt agreed to plan.     Labs Review Labs Reviewed  BASIC METABOLIC PANEL - Abnormal; Notable for the  following:    Potassium 3.1 (*)    All other components within normal limits  CBC - Abnormal; Notable for the following:    HCT 38.6 (*)    All other components within normal limits  URINALYSIS, ROUTINE W REFLEX MICROSCOPIC (NOT AT [facility])  Randolm Idol, ED    Imaging Review Dg Chest 2 View  11/08/2015  CLINICAL DATA:  Chest pain and shortness of breath since yesterday. COPD. Hypertension. EXAM: CHEST  2 VIEW COMPARISON:  07/23/2013 FINDINGS: The heart size and mediastinal contours are within normal limits. Both lungs are clear. The visualized skeletal structures are unremarkable. IMPRESSION: No active cardiopulmonary disease. Electronically Signed   By: Earle Gell M.D.   On: 11/08/2015 21:57   I have personally reviewed and evaluated these images and lab results as part of my medical decision-making.   EKG Interpretation   Date/Time:  Wednesday November 08 2015 21:38:02 EST Ventricular Rate:  67 PR Interval:  176 QRS Duration: 82 QT Interval:  438 QTC Calculation: 462  R Axis:   58 Text Interpretation:  Normal sinus rhythm Normal ECG Confirmed by POLLINA   MD, CHRISTOPHER [phone]) on 11/09/2015 2:38:37 AM      MDM   Final diagnoses:  None   dizziness Atypical chest pain  Patient presents to the ER for evaluation of dizziness. Patient reports that he has been dizzy for one full day. He feels off balance when he walks, like he is going to fall. Patient has had previous stroke. Examination today, however, shows no deficits. MRI was performed and does not show any evidence of new stroke. As symptoms have been ongoing for more than 24 hours, this is not a TIA either. Source of dizziness is not central nervous system.  Patient also complained of chest discomfort. His chest discomfort was yesterday. He has not had any today and currently is pain-free. EKG is normal. Troponin negative. No further workup necessary at this time. Patient is to follow-up with primary doctor for these  issues.  I personally performed the services described in this documentation, which was scribed in my presence. The recorded information has been reviewed and is accurate.    Orpah Greek, MD 11/09/15 [phone]

## 2015-11-09 NOTE — Discharge Instructions (Signed)
Dizziness Dizziness is a common problem. It is a feeling of unsteadiness or light-headedness. You may feel like you are about to faint. Dizziness can lead to injury if you stumble or fall. Anyone can become dizzy, but dizziness is more common in older adults. This condition can be caused by a number of things, including medicines, dehydration, or illness. HOME CARE INSTRUCTIONS Taking these steps may help with your condition: Eating and Drinking  Drink enough fluid to keep your urine clear or pale yellow. This helps to keep you from becoming dehydrated. Try to drink more clear fluids, such as water.  Do not drink alcohol.  Limit your caffeine intake if directed by your health care provider.  Limit your salt intake if directed by your health care provider. Activity  Avoid making quick movements.  Rise slowly from chairs and steady yourself until you feel okay.  In the morning, first sit up on the side of the bed. When you feel okay, stand slowly while you hold onto something until you know that your balance is fine.  Move your legs often if you need to stand in one place for a long time. Tighten and relax your muscles in your legs while you are standing.  Do not drive or operate heavy machinery if you feel dizzy.  Avoid bending down if you feel dizzy. Place items in your home so that they are easy for you to reach without leaning over. Lifestyle  Do not use any tobacco products, including cigarettes, chewing tobacco, or electronic cigarettes. If you need help quitting, ask your health care provider.  Try to reduce your stress level, such as with yoga or meditation. Talk with your health care provider if you need help. General Instructions  Watch your dizziness for any changes.  Take medicines only as directed by your health care provider. Talk with your health care provider if you think that your dizziness is caused by a medicine that you are taking.  Tell a friend or a family  member that you are feeling dizzy. If he or she notices any changes in your behavior, have this person call your health care provider.  Keep all follow-up visits as directed by your health care provider. This is important. SEEK MEDICAL CARE IF:  Your dizziness does not go away.  Your dizziness or light-headedness gets worse.  You feel nauseous.  You have reduced hearing.  You have new symptoms.  You are unsteady on your feet or you feel like the room is spinning. SEEK IMMEDIATE MEDICAL CARE IF:  You vomit or have diarrhea and are unable to eat or drink anything.  You have problems talking, walking, swallowing, or using your arms, hands, or legs.  You feel generally weak.  You are not thinking clearly or you have trouble forming sentences. It may take a friend or family member to notice this.  You have chest pain, abdominal pain, shortness of breath, or sweating.  Your vision changes.  You notice any bleeding.  You have a headache.  You have neck pain or a stiff neck.  You have a fever.   This information is not intended to replace advice given to you by your health care provider. Make sure you discuss any questions you have with your health care provider.   Document Released: 02/12/2001 Document Revised: 01/03/2015 Document Reviewed: 08/15/2014 Elsevier Interactive Patient Education 2016 Else[employer] Inc[employer]  Nonspecific Chest Pain It is often hard to find the cause of chest pain. There is always a  chance that your pain could be related to something serious, such as a heart attack or a blood clot in your lungs. Chest pain can also be caused by conditions that are not life-threatening. If you have chest pain, it is very important to follow up with your doctor.  HOME CARE  If you were prescribed an antibiotic medicine, finish it all even if you start to feel better.  Avoid any activities that cause chest pain.  Do not use any tobacco products, including cigarettes, chewing  tobacco, or electronic cigarettes. If you need help quitting, ask your doctor.  Do not drink alcohol.  Take medicines only as told by your doctor.  Keep all follow-up visits as told by your doctor. This is important. This includes any further testing if your chest pain does not go away.  Your doctor may tell you to keep your head raised (elevated) while you sleep.  Make lifestyle changes as told by your doctor. These may include:  Getting regular exercise. Ask your doctor to suggest some activities that are safe for you.  Eating a heart-healthy diet. Your doctor or a diet specialist (dietitian) can help you to learn healthy eating options.  Maintaining a healthy weight.  Managing diabetes, if necessary.  Reducing stress. GET HELP IF:  Your chest pain does not go away, even after treatment.  You have a rash with blisters on your chest.  You have a fever. GET HELP RIGHT AWAY IF:  Your chest pain is worse.  You have an increasing cough, or you cough up blood.  You have severe belly (abdominal) pain.  You feel extremely weak.  You pass out (faint).  You have chills.  You have sudden, unexplained chest discomfort.  You have sudden, unexplained discomfort in your arms, back, neck, or jaw.  You have shortness of breath at any time.  You suddenly start to sweat, or your skin gets clammy.  You feel nauseous.  You vomit.  You suddenly feel light-headed or dizzy.  Your heart begins to beat quickly, or it feels like it is skipping beats. These symptoms may be an emergency. Do not wait to see if the symptoms will go away. Get medical help right away. Call your local emergency services (911 in the [place] Do not drive yourself to the hospital.   This information is not intended to replace advice given to you by your health care provider. Make sure you discuss any questions you have with your health care provider.   Document Released: 02/05/2008 Document Revised:  09/09/2014 Document Reviewed: 03/25/2014 Elsevier Interactive Patient Education [employer].

## 2015-11-09 NOTE — ED Notes (Signed)
Pt taken to MRI  

## 2015-11-14 ENCOUNTER — Other Ambulatory Visit: Payer: Self-pay | Admitting: Family Medicine

## 2015-11-15 ENCOUNTER — Ambulatory Visit (INDEPENDENT_AMBULATORY_CARE_PROVIDER_SITE_OTHER): Payer: Medicare Other | Admitting: Neurology

## 2015-11-15 ENCOUNTER — Encounter: Payer: Self-pay | Admitting: Neurology

## 2015-11-15 VITALS — BP 128/79 | HR 65 | Ht 69.0 in | Wt 158.8 lb

## 2015-11-15 DIAGNOSIS — R42 Dizziness and giddiness: Secondary | ICD-10-CM | POA: Diagnosis not present

## 2015-11-15 DIAGNOSIS — H538 Other visual disturbances: Secondary | ICD-10-CM | POA: Insufficient documentation

## 2015-11-15 MED ORDER — ASPIRIN EC 81 MG PO TBEC: 81.0000 mg | DELAYED_RELEASE_TABLET | Freq: Every day | ORAL | Status: DC

## 2015-11-15 MED ORDER — TRAMADOL HCL 50 MG PO TABS: 100.0000 mg | ORAL_TABLET | Freq: Three times a day (TID) | ORAL | Status: DC | PRN

## 2015-11-15 NOTE — Progress Notes (Signed)
PATIENT: SELDON [name] DOB: Nov 07, 1940  REASON FOR VISIT: routine follow up for headache HISTORY FROM: patient, daughter  HISTORY OF PRESENT ILLNESS: Mr Hochstetler is a 75 year african american male seen today for followup visit for ICH, post-stroke headaches.  UPDATE 06/13/14 (LL): He returns for stroke follow up.  He states his headaches have persisted, but Tramadol does help if he takes 2. He feels his vision is much improved and he has adapted to his peripheral vision loss. Left arm pain persists and is most bothersome at night. Gabapentin does help but he does not often take this during the day. He did not find any benefit from Topamax for headaches and stopped it on his own.  He does not drive anymore. His daughter is concerned that he has to clear his throat often, but he states that he does not get choked on foods or drinks. He states his blood pressure is well controlled although it is elevated in the office today, 151/85. He has no new complaints.  UPDATE 12/10/13 (LL): Mr. Ord returns for stroke follow up. He states he feels good and his headaches are getting better. He still has left peripheral vision deficit. His speech is slurred only when he is very tired. His strength has returned but complains of left arm pain at times, mostly at night. His BP is well controlled, it is 113/67 in the office today.  On 07/02/13, he presented to ER with sudden onset of slurred speech, left facial weakness and left hemiparesthesias as well as right frontotemporal headache. CT scan of the head showed a 4 cm right temporoparietal parenchymal hemorrhage with surrounding edema and mass effect. NIH stroke scale was 14. He was admitted to the intensive care unit and blood pressure tightly controlled. Admission blood pressure was elevated at 184/100. Followed imaging study showed stable appearance of the hemorrhage. MRI scan the brain confirmed the parenchyma right temporoparietal hemorrhage with volume  of 28 cubic cc. MRI of the brain showed no large vessel occlusion. Transthoracic echo showed normal ejection fraction. Carotid ultrasound showed no stenosis. EKG showed regular sinus rhythm. Urine drug screen was negative. Vascular risk factors identified included mild hyperlipidemia with LDL of 104. Patient had persistent headache and was treated with Demerol. He had left homonymous and a hemianopsia and mild left-sided weakness and sensory loss. Clinical status improved he was discharged home. He states his done well his speech and left-sided weakness and numbness is a lot better. Headaches improved but are still persistent particularly at night he has right frontal and periorbital headache which usually responds to come about but at times it is quite disabling yet. He has occasional slurred speech off and on. She states his blood pressure has been quite well controlled at home. He has an appointment with his family physician Dr. Tawanna Cooler next month. He admits to mild short-term memory difficulties but these are not bothersome. He had followup CT scan of the head done on 07/23/13 which showed expectoration or he change in the hemorrhage with mild persistent edema and mass effect.  Update 06/15/2015 : He returns for follow-up after last visit 6 months ago. He states improvement in his headaches with Topamax and tramadol. Is tolerating Topamax 50 mg twice daily without side effects and takes tramadol as needed. His headache frequency is now once or twice a week and then not as disabling. He also takes gabapentin which helps his arm pain. States his blood pressure is usually well controlled though it is  slightly elevated in office today. Patient continues to have some mild vision difficulties from his remote brain hemorrhage now more than 2 years ago. He has no new complaints. Update 11/15/2015 : Patient is seen urgently today following a recent trip to the emergency room on 11/09/15. He is accompanied by his daughter.  Patient states that he developed sudden onset of dizziness, blurred vision, staggering gait as well as right arm pain as well as chest pain. He was seen in the emergency room where an urgent MRI scan of the brain was obtained which  I personally reviewedshowed no acute abnormality. Patient's basic lab work was unremarkable except for slightly low fashion of 3.1. Her personally reviewed the films and physician notes. Patient's is unable to pinpoint as to why he had the symptoms but they recovered within a day or so. He does occasionally get some numbness in his right hand as well as some vision difficulties as well as Dr. fields at times his speech is difficult to understand and slurred. He is not had any witnessed seizure activities, significant fall, loss of consciousness. Patient has discontinued Topamax as apparently didn't help his headaches. He is also not taking gabapentin regularly. He does take 2-3 tablets of tramadol every day and states that his headaches are doing very well. He still has some vision difficulties following his deficits from remote intracerebral hemorrhage more than 2 years ago. He states his blood pressure is well controlled and today it is 128/79. ROS:  14 system review of systems is positive for headache,  appetite change, chills, eye discharge, redness, light sensitivity, double vision, blurred vision, wheezing, testicular pain, daytime sleepiness, snoring, itching, dizziness speech difficulty and all other systems negative  ALLERGIES: No Known Allergies  HOME MEDICATIONS: Outpatient Prescriptions Prior to Visit  Medication Sig Dispense Refill  . gabapentin (NEURONTIN) 300 MG capsule Take 1 capsule (300 mg total) by mouth 3 (three) times daily. 90 capsule 11  . hydrochlorothiazide (HYDRODIURIL) 12.5 MG tablet TAKE 1 TABLET EVERY DAY 90 tablet 3  . lidocaine (XYLOCAINE) 5 % ointment Apply 1 application topically 3 (three) times daily as needed. Apply to painful areas where IV  was started 35.44 g 0  . Naproxen Sodium (ALEVE PO) Take by mouth as needed.    . quinapril (ACCUPRIL) 20 MG tablet Take 1 tablet (20 mg total) by mouth every morning. 100 tablet 3  . quinapril (ACCUPRIL) 20 MG tablet TAKE 1 TABLET EVERY MORNING 90 tablet 1  . topiramate (TOPAMAX) 50 MG tablet TAKE 1 TABLET (50 MG TOTAL) BY MOUTH 2 (TWO) TIMES DAILY. 60 tablet 6  . traMADol (ULTRAM) 50 MG tablet Take 2 tablets (100 mg total) by mouth every 8 (eight) hours as needed. 180 tablet 5  . acetaminophen (TYLENOL) 325 MG tablet Take 2 tablets (650 mg total) by mouth every 4 (four) hours as needed for mild pain. (Patient not taking: Reported on 11/15/2015) 30 tablet 2   No facility-administered medications prior to visit.     Filed Vitals:   11/15/15 0833  BP: 128/79  Pulse: 65  Height: 5\' 9"  (1.753 m)  Weight: 158 lb 12.8 oz (72.031 kg)   Body mass index is 23.44 kg/(m^2).  Physical Exam  General: well developed, well nourished elderly African American male, seated, in no evident distress  Head: head normocephalic and atraumatic. Orohparynx benign  Neck: supple with no carotid or supraclavicular bruits  Cardiovascular: regular rate and rhythm, no murmurs  Musculoskeletal: no deformity  Skin: no  rash/petichiae  Vascular: Normal pulses all extremities   Neurologic Exam  Mental Status: Awake and fully alert. Oriented to place and time. Recent and remote memory intact. Diminished recall 2/3 Attention span, concentration and fund of knowledge appropriate. Mood and affect appropriate.  Cranial Nerves: Pupils equal, briskly reactive to light. Extraocular movements full without nystagmus. Visual fields showed partial left homonymous hemianopsia to confrontation. Hearing intact. Facial sensation intact. Face, tongue, palate moves normally and symmetrically.  Motor: Normal bulk and tone. Normal strength in all tested extremity muscles. Mild left grip weakness. Diminished fine finger movements on the  left. Orbits right over left upper extremity. Mild increased tone in the left leg with stiffness  Sensory: intact to touch and pinprick and vibratory sensation.  Coordination: Rapid alternating movements normal in all extremities. Finger-to-nose and heel-to-shin performed accurately bilaterally.  Gait and Station: Arises from chair without difficulty. Stance is normal. Gait demonstrates normal stride length and balance . Able to heel, toe and tandem walk without difficulty.  Reflexes: 1+ and symmetric. Toes downgoing.    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 Clinical doing well but has persistent right frontal headaches, and left hemianopsia. Topamax  Has been effective as a headache preventative   Tramadol has been effective for symptomatic relief ofheadache and arm pain    PLAN:  I had a long discussion with the patient and daughter regarding his recent symptoms of transient dizziness, blurred vision arm numbness and chest pain being of unclear significance and likely represent exacerbation of old deficits. I recommend he take aspirin 81 mg daily for stroke prevention and maintain strict control of hypertension with blood pressure goal below 130/90. Check her screening carotid ultrasound study as well as EEG. I encouraged him to cutback tramadol for his headaches and instead take gabapentin which will help both arm pain as well as sleep. He was advised not to drive due to his persistent visual field deficit. He will return for follow-up in 3 months with nurse practitioner or call earlier if necessary.   Meds ordered this encounter  Medications  . traMADol (ULTRAM) 50 MG tablet    Sig: Take 2 tablets (100 mg total) by mouth every 8 (eight) hours as needed.    Dispense:  90 tablet    Refill:  1  . aspirin EC 81 MG tablet    Sig: Take 1 tablet (81 mg  total) by mouth daily.    Dispense:  90 tablet    Refill:  1   Return in about 3 months (around 02/15/2016).  Delia Heady, MD  11/15/2015, 9:11 AM [facility] [address] [place], [place] [address] [phone]  Note: This document was prepared with digital dictation and possible smart phrase technology. Any transcriptional errors that result from this process are unintentional.

## 2015-11-15 NOTE — Patient Instructions (Addendum)
I had a long discussion with the patient and daughter regarding his recent symptoms of transient dizziness, blurred vision arm numbness and chest pain being of unclear significance and likely represent exacerbation of old deficits. I recommend he take aspirin 81 mg daily for stroke prevention and maintain strict control of hypertension with blood pressure goal below 130/90. Check her screening carotid ultrasound study as well as EEG. I encouraged him to cutback tramadol for his headaches and instead take gabapentin which will help both arm pain as well as sleep. He was advised not to drive due to his persistent visual field deficit. He will return for follow-up in 3 months with nurse practitioner or call earlier if necessary.

## 2015-11-22 ENCOUNTER — Other Ambulatory Visit: Payer: Self-pay | Admitting: Family Medicine

## 2015-11-22 ENCOUNTER — Other Ambulatory Visit: Payer: Self-pay

## 2015-11-23 ENCOUNTER — Ambulatory Visit (INDEPENDENT_AMBULATORY_CARE_PROVIDER_SITE_OTHER): Payer: Medicare Other

## 2015-11-23 DIAGNOSIS — R42 Dizziness and giddiness: Secondary | ICD-10-CM

## 2015-11-23 DIAGNOSIS — H538 Other visual disturbances: Secondary | ICD-10-CM

## 2015-12-04 ENCOUNTER — Ambulatory Visit (INDEPENDENT_AMBULATORY_CARE_PROVIDER_SITE_OTHER): Payer: Medicare Other | Admitting: Neurology

## 2015-12-04 DIAGNOSIS — R55 Syncope and collapse: Secondary | ICD-10-CM

## 2015-12-04 DIAGNOSIS — R42 Dizziness and giddiness: Secondary | ICD-10-CM

## 2015-12-04 DIAGNOSIS — H538 Other visual disturbances: Secondary | ICD-10-CM

## 2015-12-04 NOTE — Procedures (Signed)
    History:  Eric [name] is a 75 year old gentleman with a recent visit to the emergency room on 11/09/2015 with onset of blurred vision, staggery gait, dizziness, right arm pain and chest pain. MRI of the brain was unremarkable. The patient is being evaluated for this episode.  This is a routine EEG. No skull defects are noted. Medications include aspirin, gabapentin, hydrochlorothiazide, quinapril, and Ultram.   EEG classification: Normal awake and asleep  Description of the recording: The background rhythms of this recording consists of a fairly well modulated medium amplitude background activity of 9 Hz. As the record progresses, the patient initially is in the waking state, but appears to enter the early stage II sleep during the recording, with rudimentary sleep spindles and vertex sharp wave activity seen. During the wakeful state, photic stimulation is performed, and this results in a bilateral and symmetric photic driving response. Hyperventilation was also performed, and this results in a minimal buildup of the background rhythm activities without significant slowing seen. At no time during the recording does there appear to be evidence of spike or spike wave discharges or evidence of focal slowing. EKG monitor shows no evidence of cardiac rhythm abnormalities with a heart rate of 66.  Impression: This is a normal EEG recording in the waking and sleeping state. No evidence of ictal or interictal discharges were seen at any time during the recording.

## 2015-12-12 ENCOUNTER — Telehealth: Payer: Self-pay | Admitting: Neurology

## 2015-12-12 NOTE — Telephone Encounter (Signed)
Daughter Alta Corning [phone] called to request results of EEG

## 2015-12-13 NOTE — Telephone Encounter (Signed)
Rn call Eric [name] pts daughter on DPR form. Rn stated her fathers EEG was normal. Pts daughter verbalized understanding.

## 2015-12-13 NOTE — Telephone Encounter (Signed)
Yes it was normal and I sent a message to you on 12/11/15 asking to inform patient

## 2016-02-07 ENCOUNTER — Other Ambulatory Visit: Payer: Self-pay | Admitting: Neurology

## 2016-02-07 NOTE — Telephone Encounter (Signed)
Katrina kindly call the patient and clarify as to whether he is actually taking Topamax and whether it is helping his headaches are not. I may refill if it is benefiting him

## 2016-02-07 NOTE — Telephone Encounter (Signed)
Dr Leonie Man- Pt requesting refill on topamax. I see in last OV that you discontinued med d/t non compliance. I did not see in the note where you wanted him to continue or not continue med.  Please advise

## 2016-02-07 NOTE — Telephone Encounter (Signed)
Rn call patients daughter Eric [name] on dpr form for her father. Eric [name] comes to all of patients appt at [facility] with her father. Rn stated pt or someone call for him this week requesting topamax. Rn stated that per Dr.Sethi last note pt had stop taking the topamax because he said it did not work. Also Rn stated per last office note he wanted patient to cut back on the tramodol per his note. Eric [name] stated she thinks he was trying to get tramdol filled. Rn requested pts daughter Eric [name] to speak with her father to find out if he is having headaches and to get clarification on his call. Eric [name] will call tomorrow before 0500pm.

## 2016-02-07 NOTE — Telephone Encounter (Signed)
[facility] called the patient and clarify whether he is actually taking the Topamax and if it is helping him we can refill it.

## 2016-02-14 NOTE — Telephone Encounter (Signed)
Patients daughter never call back to clarify what her father was calling for. Note closed.

## 2016-02-29 ENCOUNTER — Other Ambulatory Visit: Payer: Self-pay | Admitting: Neurology

## 2016-03-01 ENCOUNTER — Other Ambulatory Visit: Payer: Self-pay

## 2016-03-01 MED ORDER — GABAPENTIN 300 MG PO CAPS: 300.0000 mg | ORAL_CAPSULE | Freq: Three times a day (TID) | ORAL | Status: DC

## 2016-03-06 ENCOUNTER — Encounter: Payer: Self-pay | Admitting: Neurology

## 2016-03-06 ENCOUNTER — Ambulatory Visit (INDEPENDENT_AMBULATORY_CARE_PROVIDER_SITE_OTHER): Payer: Medicare Other | Admitting: Neurology

## 2016-03-06 VITALS — BP 137/76 | HR 60 | Ht 69.0 in | Wt 165.0 lb

## 2016-03-06 DIAGNOSIS — G441 Vascular headache, not elsewhere classified: Secondary | ICD-10-CM

## 2016-03-06 MED ORDER — TOPIRAMATE 50 MG PO TABS: 50.0000 mg | ORAL_TABLET | Freq: Two times a day (BID) | ORAL | Status: DC

## 2016-03-06 MED ORDER — TRAMADOL HCL 50 MG PO TABS: 100.0000 mg | ORAL_TABLET | Freq: Three times a day (TID) | ORAL | Status: DC | PRN

## 2016-03-06 NOTE — Patient Instructions (Signed)
I had a long d/w patient and wife about his remote stroke,paresthesias, risk for recurrent stroke/TIAs, personally independently reviewed imaging studies and stroke evaluation results and answered questions.Continue aspirin 81 mg daily  for secondary stroke prevention and maintain strict control of hypertension with blood pressure goal below 130/90, diabetes with hemoglobin A1c goal below 6.5% and lipids with LDL cholesterol goal below 70 mg/dL. I also advised the patient to eat a healthy diet with plenty of whole grains, cereals, fruits and vegetables, exercise regularly and maintain ideal body weight. Increase Topamax dose to 50 mg in the morning and 100 mg at night. Continue gabapentin 300 mg 3 times daily. He was given a refill of tramadol but advised to take it only as needed and not more than 2 days a week to avoid dependency Followup in the future with  stroke nurse practitioner in 3 months or call earlier if necessary

## 2016-03-06 NOTE — Progress Notes (Signed)
PATIENT: Eric [name] DOB: [birth year]  REASON FOR VISIT: routine follow up for headache HISTORY FROM: patient, daughter  HISTORY OF PRESENT ILLNESS: Eric [name] is a 29 year african american male seen today for followup visit for ICH, post-stroke headaches.  UPDATE 06/13/14 (LL): He returns for stroke follow up.  He states his headaches have persisted, but Tramadol does help if he takes 2. He feels his vision is much improved and he has adapted to his peripheral vision loss. Left arm pain persists and is most bothersome at night. Gabapentin does help but he does not often take this during the day. He did not find any benefit from Topamax for headaches and stopped it on his own.  He does not drive anymore. His daughter is concerned that he has to clear his throat often, but he states that he does not get choked on foods or drinks. He states his blood pressure is well controlled although it is elevated in the office today, 151/85. He has no new complaints.  UPDATE 12/10/13 (LL): Eric [name] returns for stroke follow up. He states he feels good and his headaches are getting better. He still has left peripheral vision deficit. His speech is slurred only when he is very tired. His strength has returned but complains of left arm pain at times, mostly at night. His BP is well controlled, it is 113/67 in the office today.  On 07/02/13, he presented to ER with sudden onset of slurred speech, left facial weakness and left hemiparesthesias as well as right frontotemporal headache. CT scan of the head showed a 4 cm right temporoparietal parenchymal hemorrhage with surrounding edema and mass effect. NIH stroke scale was 14. He was admitted to the intensive care unit and blood pressure tightly controlled. Admission blood pressure was elevated at 184/100. Followed imaging study showed stable appearance of the hemorrhage. MRI scan the brain confirmed the parenchyma right temporoparietal hemorrhage with volume  of 28 cubic cc. MRI of the brain showed no large vessel occlusion. Transthoracic echo showed normal ejection fraction. Carotid ultrasound showed no stenosis. EKG showed regular sinus rhythm. Urine drug screen was negative. Vascular risk factors identified included mild hyperlipidemia with LDL of 104. Patient had persistent headache and was treated with Demerol. He had left homonymous and a hemianopsia and mild left-sided weakness and sensory loss. Clinical status improved he was discharged home. He states his done well his speech and left-sided weakness and numbness is a lot better. Headaches improved but are still persistent particularly at night he has right frontal and periorbital headache which usually responds to come about but at times it is quite disabling yet. He has occasional slurred speech off and on. She states his blood pressure has been quite well controlled at home. He has an appointment with his family physician Dr. Tawanna Cooler next month. He admits to mild short-term memory difficulties but these are not bothersome. He had followup CT scan of the head done on 07/23/13 which showed expectoration or he change in the hemorrhage with mild persistent edema and mass effect.  Update 06/15/2015 : He returns for follow-up after last visit 6 months ago. He states improvement in his headaches with Topamax and tramadol. Is tolerating Topamax 50 mg twice daily without side effects and takes tramadol as needed. His headache frequency is now once or twice a week and then not as disabling. He also takes gabapentin which helps his arm pain. States his blood pressure is usually well controlled though it is  slightly elevated in office today. Patient continues to have some mild vision difficulties from his remote brain hemorrhage now more than 2 years ago. He has no new complaints. Update 11/15/2015 : Patient is seen urgently today following a recent trip to the emergency room on 11/09/15. He is accompanied by his daughter.  Patient states that he developed sudden onset of dizziness, blurred vision, staggering gait as well as right arm pain as well as chest pain. He was seen in the emergency room where an urgent MRI scan of the brain was obtained which  I personally reviewedshowed no acute abnormality. Patient's basic lab work was unremarkable except for slightly low fashion of 3.1. Her personally reviewed the films and physician notes. Patient's is unable to pinpoint as to why he had the symptoms but they recovered within a day or so. He does occasionally get some numbness in his right hand as well as some vision difficulties as well as Dr. fields at times his speech is difficult to understand and slurred. He is not had any witnessed seizure activities, significant fall, loss of consciousness. Patient has discontinued Topamax as apparently didn't help his headaches. He is also not taking gabapentin regularly. He does take 2-3 tablets of tramadol every day and states that his headaches are doing very well. He still has some vision difficulties following his deficits from remote intracerebral hemorrhage more than 2 years ago. He states his blood pressure is well controlled and today it is 128/79. Update 03/06/2016 : He returns for follow-up after last visit 3 months ago. Is accompanied by his wife. He states that he still gets headaches off and on. He is tolerating Topamax 50 mg twice daily as well as gabapentin 303 times daily. Headache frequency may have "gone down but his headache still pretty severe. Tramadol seems to help but he ran out of his prescription. Still has some paresthesias in his hands as well. He had follow-up carotid ultrasound done on 11/29/15 showed no significant extracranial stenosis. EEG done on 12/04/15 was normal. He has not had any recurrent stroke or TIA symptoms. He remains on aspirin which is tolerating well without bruising or bleeding. States his blood pressure is well controlled and today it is 137/76. He  states his sugars also better controlled his cannot and given the past and intermittent A1c checked. He wants a refill of the tramadol ROS:  14 system review of systems is positive for headache,   runny nose, trouble swallowing, appetite change, excessive sweating, light sensitivity, double vision, blurred vision, frequent waking, daytime sleepiness, snoring, joint pain and all other systems negative  ALLERGIES: No Known Allergies  HOME MEDICATIONS: Outpatient Prescriptions Prior to Visit  Medication Sig Dispense Refill  . aspirin EC 81 MG tablet Take 1 tablet (81 mg total) by mouth daily. 90 tablet 1  . gabapentin (NEURONTIN) 300 MG capsule START WITH 1 CAPSULE AT NIGHT AND TAKE DURING THE DAY AS NEEDED UP TO 3 TIMES DAILY 90 capsule 2  . gabapentin (NEURONTIN) 300 MG capsule Take 1 capsule (300 mg total) by mouth 3 (three) times daily. 90 capsule 0  . hydrochlorothiazide (HYDRODIURIL) 12.5 MG tablet TAKE 1 TABLET EVERY DAY 90 tablet 3  . hydrochlorothiazide (MICROZIDE) 12.5 MG capsule TAKE ONE CAPSULE BY MOUTH EVERY DAY 90 capsule 2  . lidocaine (XYLOCAINE) 5 % ointment Apply 1 application topically 3 (three) times daily as needed. Apply to painful areas where IV was started 35.44 g 0  . Naproxen Sodium (ALEVE PO) Take  by mouth as needed.    . quinapril (ACCUPRIL) 20 MG tablet TAKE 1 TABLET EVERY MORNING 90 tablet 1  . topiramate (TOPAMAX) 50 MG tablet TAKE 1 TABLET (50 MG TOTAL) BY MOUTH 2 (TWO) TIMES DAILY. 60 tablet 2  . traMADol (ULTRAM) 50 MG tablet Take 2 tablets (100 mg total) by mouth every 8 (eight) hours as needed. 90 tablet 1  . quinapril (ACCUPRIL) 20 MG tablet Take 1 tablet (20 mg total) by mouth every morning. (Patient not taking: Reported on 03/06/2016) 100 tablet 3   No facility-administered medications prior to visit.     Filed Vitals:   03/06/16 1137  BP: 137/76  Pulse: 60  Height: 5\' 9"  (1.753 m)  Weight: 165 lb (74.844 kg)   Body mass index is 24.36  kg/(m^2).  Physical Exam  General: well developed, well nourished elderly African American male, seated, in no evident distress  Head: head normocephalic and atraumatic. Orohparynx benign  Neck: supple with no carotid or supraclavicular bruits  Cardiovascular: regular rate and rhythm, no murmurs  Musculoskeletal: no deformity  Skin: no rash/petichiae  Vascular: Normal pulses all extremities   Neurologic Exam  Mental Status: Awake and fully alert. Oriented to place and time. Recent and remote memory intact. Diminished recall 2/3 Attention span, concentration and fund of knowledge appropriate. Mood and affect appropriate.  Cranial Nerves: Pupils equal, briskly reactive to light. Extraocular movements full without nystagmus. Visual fields showed partial left homonymous hemianopsia to confrontation. Hearing intact. Facial sensation intact. Face, tongue, palate moves normally and symmetrically.  Motor: Normal bulk and tone. Normal strength in all tested extremity muscles. Mild left grip weakness. Diminished fine finger movements on the left. Orbits right over left upper extremity. Mild increased tone in the left leg with stiffness  Sensory: intact to touch and pinprick and vibratory sensation.  Coordination: Rapid alternating movements normal in all extremities. Finger-to-nose and heel-to-shin performed accurately bilaterally.  Gait and Station: Arises from chair without difficulty. Stance is normal. Gait demonstrates normal stride length and balance . Able to heel, toe and tandem walk without difficulty.  Reflexes: 1+ and symmetric. Toes downgoing.    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 Clinical doing well but has persistent right frontal headaches, and left hemianopsia. Topamax  Has been effective as a headache preventative   Tramadol has been effective  for symptomatic relief ofheadache and arm pain    PLAN:  I had a long d/w patient and wife about his remote stroke,paresthesias, risk for recurrent stroke/TIAs, personally independently reviewed imaging studies and stroke evaluation results and answered questions.Continue aspirin 81 mg daily  for secondary stroke prevention and maintain strict control of hypertension with blood pressure goal below 130/90, diabetes with hemoglobin A1c goal below 6.5% and lipids with LDL cholesterol goal below 70 mg/dL. I also advised the patient to eat a healthy diet with plenty of whole grains, cereals, fruits and vegetables, exercise regularly and maintain ideal body weight. Increase Topamax dose to 50 mg in the morning and 100 mg at night. Continue gabapentin 300 mg 3 times daily. He was given a refill of tramadol but advised to take it only as needed and not more than 2 days a week to avoid dependency Followup in the future with  stroke nurse practitioner in 3 months or call earlier if necessary   Meds ordered this encounter  Medications  . topiramate (TOPAMAX) 50 MG  tablet    Sig: Take 1 tablet (50 mg total) by mouth 2 (two) times daily.    Dispense:  60 tablet    Refill:  2    Take one tablet in am and two at night  . traMADol (ULTRAM) 50 MG tablet    Sig: Take 2 tablets (100 mg total) by mouth every 8 (eight) hours as needed.    Dispense:  90 tablet    Refill:  0   Return in about 3 months (around 06/06/2016).  Delia Heady, MD  03/06/2016, 1:10 PM [facility] [address] [place], [place] [address] [phone]  Note: This document was prepared with digital dictation and possible smart phrase technology. Any transcriptional errors that result from this process are unintentional.

## 2016-05-08 ENCOUNTER — Other Ambulatory Visit: Payer: Self-pay | Admitting: Neurology

## 2016-05-13 ENCOUNTER — Other Ambulatory Visit: Payer: Self-pay | Admitting: Neurology

## 2016-05-15 ENCOUNTER — Telehealth: Payer: Self-pay

## 2016-05-15 NOTE — Telephone Encounter (Signed)
Tramadol fax to patients pharmacy [facility].

## 2016-06-10 ENCOUNTER — Ambulatory Visit: Payer: Self-pay | Admitting: Nurse Practitioner

## 2016-06-11 ENCOUNTER — Encounter: Payer: Self-pay | Admitting: Nurse Practitioner

## 2016-06-26 ENCOUNTER — Other Ambulatory Visit: Payer: Self-pay | Admitting: Neurology

## 2016-06-27 ENCOUNTER — Encounter: Payer: Self-pay | Admitting: Nurse Practitioner

## 2016-06-27 ENCOUNTER — Other Ambulatory Visit: Payer: Self-pay | Admitting: Family Medicine

## 2016-06-27 ENCOUNTER — Ambulatory Visit (INDEPENDENT_AMBULATORY_CARE_PROVIDER_SITE_OTHER): Payer: Medicare Other | Admitting: Nurse Practitioner

## 2016-06-27 VITALS — BP 134/82 | HR 64 | Ht 69.0 in | Wt 160.4 lb

## 2016-06-27 DIAGNOSIS — I10 Essential (primary) hypertension: Secondary | ICD-10-CM | POA: Diagnosis not present

## 2016-06-27 DIAGNOSIS — G441 Vascular headache, not elsewhere classified: Secondary | ICD-10-CM

## 2016-06-27 DIAGNOSIS — E7800 Pure hypercholesterolemia, unspecified: Secondary | ICD-10-CM

## 2016-06-27 DIAGNOSIS — I611 Nontraumatic intracerebral hemorrhage in hemisphere, cortical: Secondary | ICD-10-CM

## 2016-06-27 MED ORDER — GABAPENTIN 300 MG PO CAPS: ORAL_CAPSULE | ORAL | 6 refills | Status: DC

## 2016-06-27 MED ORDER — TRAMADOL HCL 50 MG PO TABS: 100.0000 mg | ORAL_TABLET | Freq: Four times a day (QID) | ORAL | 1 refills | Status: DC | PRN

## 2016-06-27 NOTE — Progress Notes (Signed)
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  UPDATE 06/13/14 (LL): He returns for stroke follow up.  He states his headaches have persisted, but Tramadol does help if he takes 2. He feels his vision is much improved and he has adapted to his peripheral vision loss. Left arm pain persists and is most bothersome at night. Gabapentin does help but he does not often take this during the day. He did not find any benefit from Topamax for headaches and stopped it on his own.  He does not drive anymore. His daughter is concerned that he has to clear his throat often, but he states that he does not get choked on foods or drinks. He states his blood pressure is well controlled although it is elevated in the office today, 151/85. He has no new complaints.  UPDATE 12/10/13 (LL): Eric [name] returns for stroke follow up. He states he feels good and his headaches are getting better. He still has left peripheral vision deficit. His speech is slurred only when he is very tired. His strength has returned but complains of left arm pain at times, mostly at night. His BP is well controlled, it is 113/67 in the office today.  On 07/02/13, he presented to ER with sudden onset of slurred speech, left facial weakness and left hemiparesthesias as well as right frontotemporal headache. CT scan of the head showed a 4 cm right temporoparietal parenchymal hemorrhage with surrounding edema and mass effect. NIH stroke scale was 14. He was admitted to the intensive care unit and blood pressure tightly controlled. Admission blood pressure was elevated at 184/100. Followed imaging study showed stable appearance of the hemorrhage. MRI scan the brain confirmed the  parenchyma right temporoparietal hemorrhage with volume of 28 cubic cc. MRI of the brain showed no large vessel occlusion. Transthoracic echo showed normal ejection fraction. Carotid ultrasound showed no stenosis. EKG showed regular sinus rhythm. Urine drug screen was negative. Vascular risk factors identified included mild hyperlipidemia with LDL of 104. Patient had persistent headache and was treated with Demerol. He had left homonymous and a hemianopsia and mild left-sided weakness and sensory loss. Clinical status improved he was discharged home. He states his done well his speech and left-sided weakness and numbness is a lot better. Headaches improved but are still persistent particularly at night he has right frontal and periorbital headache which usually responds to come about but at times it is quite disabling yet. He has occasional slurred speech off and on. She states his blood pressure has been quite well controlled at home. He has an appointment with his family physician Dr. Tawanna Cooler next month. He admits to mild short-term memory difficulties but these are not bothersome. He had followup CT scan of the head done on 07/23/13 which showed expectoration or he change in the hemorrhage with mild persistent edema and mass effect.  Update 06/15/2015 : He returns for follow-up after last visit 6 months ago. He states improvement in his headaches with Topamax and tramadol. Is tolerating Topamax 50 mg twice daily without side effects and takes tramadol as needed. His headache frequency is now once or twice a week and then not as disabling. He also takes gabapentin which helps his arm pain. States his  blood pressure is usually well controlled though it is slightly elevated in office today. Patient continues to have some mild vision difficulties from his remote brain hemorrhage now more than 2 years ago. He has no new complaints. Update 11/15/2015 : Patient is seen urgently today following a recent trip to the  emergency room on 11/09/15. He is accompanied by his daughter. Patient states that he developed sudden onset of dizziness, blurred vision, staggering gait as well as right arm pain as well as chest pain. He was seen in the emergency room where an urgent MRI scan of the brain was obtained which  I personally reviewedshowed no acute abnormality. Patient's basic lab work was unremarkable except for slightly low fashion of 3.1. Her personally reviewed the films and physician notes. Patient's is unable to pinpoint as to why he had the symptoms but they recovered within a day or so. He does occasionally get some numbness in his right hand as well as some vision difficulties as well as Dr. fields at times his speech is difficult to understand and slurred. He is not had any witnessed seizure activities, significant fall, loss of consciousness. Patient has discontinued Topamax as apparently didn't help his headaches. He is also not taking gabapentin regularly. He does take 2-3 tablets of tramadol every day and states that his headaches are doing very well. He still has some vision difficulties following his deficits from remote intracerebral hemorrhage more than 2 years ago. He states his blood pressure is well controlled and today it is 128/79. Update 03/06/2016 : He returns for follow-up after last visit 3 months ago. Is accompanied by his wife. He states that he still gets headaches off and on. He is tolerating Topamax 50 mg twice daily as well as gabapentin 303 times daily. Headache frequency may have "gone down but his headache still pretty severe. Tramadol seems to help but he ran out of his prescription. Still has some paresthesias in his hands as well. He had follow-up carotid ultrasound done on 11/29/15 showed no significant extracranial stenosis. EEG done on 12/04/15 was normal. He has not had any recurrent stroke or TIA symptoms. He remains on aspirin which is tolerating well without bruising or bleeding. States his  blood pressure is well controlled and today it is 137/76. He states his sugars also better controlled his cannot and given the past and intermittent A1c checked. He wants a refill of the tramadol UPDATE 10/26/2017CM Eric. Imes, 75 year old male returns for follow-up. His headaches are much better. His daughter has now started monitoring his medications because he was taking his. Tramadol thinking it was his blood pressure medication. He is only to use that when necessary no more than twice a week. He is also on Topamax 50 mg 1 in the morning and 2 at night and she takes Neurontin 300mg  3 times daily. He is on aspirin for secondary stroke prevention and has not had further stroke or TIA symptoms. He has not had any bruising or bleeding. Blood pressure is well controlled in the office today 134/82 he returns for reevaluation of REVIEW OF SYSTEMS: Full 14 system review of systems performed and notable only for those listed, all others are neg:  Constitutional: neg  Cardiovascular: neg Ear/Nose/Throat: neg  Skin: neg Eyes: Light sensitivity Respiratory: neg Gastroitestinal: neg  Hematology/Lymphatic: neg  Endocrine: Intolerance to heat and cold Musculoskeletal:neg Allergy/Immunology: neg Neurological: Headache, history of stroke Psychiatric: neg Sleep : neg   ALLERGIES: No Known Allergies  HOME MEDICATIONS: Outpatient Medications  Prior to Visit  Medication Sig Dispense Refill  . aspirin 81 MG EC tablet TAKE 1 TABLET BY MOUTH EVERY DAY 90 tablet 1  . gabapentin (NEURONTIN) 300 MG capsule Take 1 capsule (300 mg total) by mouth 3 (three) times daily. 90 capsule 0  . hydrochlorothiazide (HYDRODIURIL) 12.5 MG tablet TAKE 1 TABLET EVERY DAY 90 tablet 3  . quinapril (ACCUPRIL) 20 MG tablet TAKE 1 TABLET EVERY MORNING 90 tablet 1  . topiramate (TOPAMAX) 50 MG tablet Take 1 tablet (50 mg total) by mouth 2 (two) times daily. 60 tablet 2  . traMADol (ULTRAM) 50 MG tablet TAKE 2 TABLETS BY MOUTH  EVERY 6 HOURS AS NEEDED 90 tablet 1  . hydrochlorothiazide (MICROZIDE) 12.5 MG capsule TAKE ONE CAPSULE BY MOUTH EVERY DAY (Patient not taking: Reported on 06/27/2016) 90 capsule 2  . gabapentin (NEURONTIN) 300 MG capsule START WITH 1 CAPSULE AT NIGHT AND TAKE DURING THE DAY AS NEEDED UP TO 3 TIMES DAILY 90 capsule 2  . lidocaine (XYLOCAINE) 5 % ointment Apply 1 application topically 3 (three) times daily as needed. Apply to painful areas where IV was started 35.44 g 0  . Naproxen Sodium (ALEVE PO) Take by mouth as needed.     No facility-administered medications prior to visit.     PAST MEDICAL HISTORY: Past Medical History:  Diagnosis Date  . Benign prostatic hypertrophy   . COPD (chronic obstructive pulmonary disease) (HCC)    in past  . Difficulty reading    and writing  . ED (erectile dysfunction)   . 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 Hypertension   . Stroke [facility]) 06/2014    PAST SURGICAL HISTORY: Past Surgical History:  Procedure Laterality Date  . bilateral breast removal    . COLONOSCOPY  04/24/05    FAMILY HISTORY: Family History  Problem Relation Age of Onset  . Arthritis Mother   . Diabetes Father   . Diabetes Other   . Hypertension Other     SOCIAL HISTORY: Social History   Social History  . Marital status: Married    Spouse name: Nellie  . Number of children: 6  . Years of education: 10th   Occupational History  . retired    Social History Main Topics  . Smoking status: Former Smoker    Quit date: 09/22/1999  . Smokeless tobacco: Never Used  . Alcohol use No  . Drug use: No  . Sexual activity: Yes   Other Topics Concern  . Not on file   Social History Narrative   Patient lives at home with wife,consumes no caffeine              PHYSICAL EXAM  Vitals:   06/27/16 1532  BP: 134/82  Pulse: 64  Weight: 160 lb 6.4 oz (72.8 kg)  Height: 5\' 9"  (1.753 m)   Body mass index is 23.69 kg/m. General: well developed, well  nourished elderly African American male, seated, in no evident distress  Head: head normocephalic and atraumatic. Orohparynx benign  Neck: supple with no carotid  bruits  Cardiovascular: regular rate and rhythm, no murmurs  Musculoskeletal: no deformity  Skin: no rash/petichiae  Vascular: Normal pulses all extremities   Neurologic Exam  Mental Status: Awake and fully alert. Oriented to place and time. Recent and remote memory intact. Concentration and fund of knowledge appropriate. Mood and affect appropriate.  Cranial Nerves: Pupils equal, briskly reactive to light. Extraocular movements full without nystagmus. Visual fields showed  partial left homonymous hemianopsia to confrontation. Hearing intact. Facial sensation intact. Face, tongue, palate moves normally and symmetrically.  Motor: Normal bulk and tone. Normal strength in all tested extremity muscles.  Mild increased tone in the left leg with stiffness  Sensory: intact to touch and pinprick and vibratory sensation. In the upper and lower extremities  Coordination: Rapid alternating movements normal in all extremities. Finger-to-nose and heel-to-shin performed accurately bilaterally.  Gait and Station: Arises from chair without difficulty. Stance is normal. Gait demonstrates normal stride length and balance . Able to heel, toe and tandem walk without difficulty.  Reflexes: 1+ and symmetric. Toes downgoing.    DIAGNOSTIC DATA (LABS, IMAGING, TESTING) - I reviewed patient records, labs, notes, testing and imaging myself where available.  Lab Results  Component Value Date   WBC 8.0 11/08/2015   HGB 13.2 11/08/2015   HCT 38.6 (L) 11/08/2015   MCV 83.0 11/08/2015   PLT 312 11/08/2015      Component Value Date/Time   NA 140 11/08/2015 2151   K 3.1 (L) 11/08/2015 2151   CL 105 11/08/2015 2151   CO2 25 11/08/2015 2151   GLUCOSE 94 11/08/2015 2151   BUN 14 11/08/2015 2151   CREATININE 1.06 11/08/2015 2151   CALCIUM 9.4 11/08/2015  2151   PROT 7.9 06/21/2014 0930   ALBUMIN 3.6 06/21/2014 0930   AST 26 06/21/2014 0930   ALT 17 06/21/2014 0930   ALKPHOS 75 06/21/2014 0930   BILITOT 1.0 06/21/2014 0930   GFRNONAA >60 11/08/2015 2151   GFRAA >60 11/08/2015 2151    ASSESSMENT AND PLAN 61 year african [place] male with a 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 Clinical doing well but has persistent right frontal headaches, and left hemianopsia. Topamax  Has been effective as a headache preventative   Tramadol has been effective for symptomatic relief of headache and arm pain . gabapentin has also been beneficial     PLAN: Continue aspirin 81 mg daily  for secondary stroke prevention maintain strict control of hypertension with blood pressure goal below 130/90, today's reading 134/82   lipids with LDL cholesterol goal below 70 mg/dL.  Continue Topamax 50 mg 1 in the morning and 2 at night Continue Neurontin 300 mg 3 times daily and 2 at night  I also advised the patient to eat a healthy diet with plenty of whole grains, cereals, fruits fill  tramadol but advised to take it only as needed and not more than 2 days a week to avoid dependency RX to pt Follow-up in 6 monthsVst time 30 min Nilda Riggs, [facility], [facility], APRN  [facility] [address] [place], [place] [address] [phone]

## 2016-06-27 NOTE — Patient Instructions (Signed)
Continue aspirin 81 mg daily  for secondary stroke prevention maintain strict control of hypertension with blood pressure goal below 130/90, today's reading 134/82  lipids with LDL cholesterol goal below 70 mg/dL.  Continue Topamax 50 mg 1 in the morning and 2 at night Continue Neurontin 300 mg 3 times daily and 2 at night  I also advised the patient to eat a healthy diet with plenty of whole grains, cereals, fruits efill of tramadol but advised to take it only as needed and not more than 2 days a week to avoid dependency  Follow-up in 6 months

## 2016-06-28 NOTE — Progress Notes (Signed)
I agree with the above plan 

## 2016-07-08 ENCOUNTER — Telehealth: Payer: Self-pay | Admitting: Emergency Medicine

## 2016-07-08 NOTE — Telephone Encounter (Signed)
Pts daughter saw you today and she said that you were willing to take her dad on as a new patient. Is that ok with you? Please advise thanks.

## 2016-07-08 NOTE — Telephone Encounter (Signed)
Ok Thx 

## 2016-08-08 ENCOUNTER — Other Ambulatory Visit: Payer: Self-pay | Admitting: Family Medicine

## 2016-08-08 DIAGNOSIS — I10 Essential (primary) hypertension: Secondary | ICD-10-CM

## 2016-08-08 DIAGNOSIS — I615 Nontraumatic intracerebral hemorrhage, intraventricular: Secondary | ICD-10-CM

## 2016-08-15 ENCOUNTER — Ambulatory Visit: Payer: Self-pay | Admitting: Family

## 2016-08-16 ENCOUNTER — Encounter: Payer: Self-pay | Admitting: Internal Medicine

## 2016-08-16 ENCOUNTER — Other Ambulatory Visit (INDEPENDENT_AMBULATORY_CARE_PROVIDER_SITE_OTHER): Payer: Medicare Other

## 2016-08-16 ENCOUNTER — Ambulatory Visit (INDEPENDENT_AMBULATORY_CARE_PROVIDER_SITE_OTHER): Payer: Medicare Other | Admitting: Internal Medicine

## 2016-08-16 VITALS — BP 158/96 | HR 64 | Ht 69.0 in | Wt 164.0 lb

## 2016-08-16 DIAGNOSIS — E785 Hyperlipidemia, unspecified: Secondary | ICD-10-CM | POA: Diagnosis not present

## 2016-08-16 DIAGNOSIS — R202 Paresthesia of skin: Secondary | ICD-10-CM

## 2016-08-16 DIAGNOSIS — I611 Nontraumatic intracerebral hemorrhage in hemisphere, cortical: Secondary | ICD-10-CM

## 2016-08-16 DIAGNOSIS — E876 Hypokalemia: Secondary | ICD-10-CM | POA: Insufficient documentation

## 2016-08-16 DIAGNOSIS — N401 Enlarged prostate with lower urinary tract symptoms: Secondary | ICD-10-CM

## 2016-08-16 DIAGNOSIS — E559 Vitamin D deficiency, unspecified: Secondary | ICD-10-CM | POA: Diagnosis not present

## 2016-08-16 DIAGNOSIS — R351 Nocturia: Secondary | ICD-10-CM

## 2016-08-16 DIAGNOSIS — I10 Essential (primary) hypertension: Secondary | ICD-10-CM

## 2016-08-16 DIAGNOSIS — H25813 Combined forms of age-related cataract, bilateral: Secondary | ICD-10-CM | POA: Diagnosis not present

## 2016-08-16 LAB — LIPID PANEL
CHOLESTEROL: 186 mg/dL (ref 0–200)
HDL: 58.2 mg/dL (ref >39.00)
LDL Cholesterol: 108 mg/dL — ABNORMAL HIGH (ref 0–99)
NONHDL: 128.09
TRIGLYCERIDES: 99 mg/dL (ref 0.0–149.0)
Total CHOL/HDL Ratio: 3
VLDL: 19.8 mg/dL (ref 0.0–40.0)

## 2016-08-16 LAB — BASIC METABOLIC PANEL
BUN: 9 mg/dL (ref 6–23)
CALCIUM: 9.7 mg/dL (ref 8.4–10.5)
CO2: 34 mEq/L — ABNORMAL HIGH (ref 19–32)
CREATININE: 0.92 mg/dL (ref 0.40–1.50)
Chloride: 99 mEq/L (ref 96–112)
GFR: 102.95 mL/min (ref >60.00)
Glucose, Bld: 102 mg/dL — ABNORMAL HIGH (ref 70–99)
Potassium: 3.9 mEq/L (ref 3.5–5.1)
Sodium: 138 mEq/L (ref 135–145)

## 2016-08-16 LAB — HEPATIC FUNCTION PANEL
ALBUMIN: 4.3 g/dL (ref 3.5–5.2)
ALT: 12 U/L (ref 0–53)
AST: 17 U/L (ref 0–37)
Alkaline Phosphatase: 74 U/L (ref 39–117)
Bilirubin, Direct: 0.1 mg/dL (ref 0.0–0.3)
TOTAL PROTEIN: 7.8 g/dL (ref 6.0–8.3)
Total Bilirubin: 0.5 mg/dL (ref 0.2–1.2)

## 2016-08-16 LAB — TSH: TSH: 1.86 u[IU]/mL (ref 0.35–4.50)

## 2016-08-16 LAB — VITAMIN B12: Vitamin B-12: 207 pg/mL — ABNORMAL LOW (ref 211–911)

## 2016-08-16 MED ORDER — VITAMIN D3 50 MCG (2000 UT) PO CAPS: 2000.0000 [IU] | ORAL_CAPSULE | Freq: Every day | ORAL | 3 refills | Status: DC

## 2016-08-16 MED ORDER — AMLODIPINE BESY-BENAZEPRIL HCL 5-20 MG PO CAPS: 1.0000 | ORAL_CAPSULE | Freq: Every day | ORAL | 3 refills | Status: DC

## 2016-08-16 MED ORDER — TRAMADOL HCL 50 MG PO TABS: 100.0000 mg | ORAL_TABLET | Freq: Two times a day (BID) | ORAL | 1 refills | Status: DC | PRN

## 2016-08-16 MED ORDER — QUINAPRIL-HYDROCHLOROTHIAZIDE 20-25 MG PO TABS: 1.0000 | ORAL_TABLET | Freq: Every day | ORAL | 3 refills | Status: DC

## 2016-08-16 MED ORDER — GABAPENTIN 300 MG PO CAPS: ORAL_CAPSULE | ORAL | 6 refills | Status: DC

## 2016-08-16 NOTE — Assessment & Plan Note (Signed)
D/c Accupril, HCTZ  Start Lotrel

## 2016-08-16 NOTE — Assessment & Plan Note (Signed)
Labs

## 2016-08-16 NOTE — Progress Notes (Signed)
Pre visit review using our clinic review tool, if applicable. No additional management support is needed unless otherwise documented below in the visit note. 

## 2016-08-16 NOTE — Assessment & Plan Note (Signed)
Dr Leonie Man CVA 2014

## 2016-08-16 NOTE — Progress Notes (Signed)
Subjective:  Patient ID: Eric [name], male    DOB: February 17, 1941  Age: 75 y.o. MRN: [id number]  CC: No chief complaint on file.   HPI Eric [name] presents for HTN, CVA f/u C/o urinating all the time after he started to take HCTZ C/o B arms, hand pain, tingling x 2 years  Outpatient Medications Prior to Visit  Medication Sig Dispense Refill  . aspirin 81 MG EC tablet TAKE 1 TABLET BY MOUTH EVERY DAY 90 tablet 1  . gabapentin (NEURONTIN) 300 MG capsule 1 three times daily and 2 at bedtime 150 capsule 6  . hydrochlorothiazide (HYDRODIURIL) 12.5 MG tablet TAKE 1 TABLET BY MOUTH EVERY DAY 30 tablet 1  . quinapril (ACCUPRIL) 20 MG tablet TAKE 1 TABLET EVERY MORNING 90 tablet 1  . topiramate (TOPAMAX) 50 MG tablet Take 1 tablet (50 mg total) by mouth 2 (two) times daily. 60 tablet 2  . traMADol (ULTRAM) 50 MG tablet Take 2 tablets (100 mg total) by mouth every 6 (six) hours as needed. Do not use more than twice weekly 30 tablet 1  . hydrochlorothiazide (HYDRODIURIL) 12.5 MG tablet TAKE 1 TABLET EVERY DAY 90 tablet 3  . hydrochlorothiazide (MICROZIDE) 12.5 MG capsule TAKE ONE CAPSULE BY MOUTH EVERY DAY 90 capsule 2   No facility-administered medications prior to visit.     ROS Review of Systems  Constitutional: Positive for fatigue. Negative for appetite change and unexpected weight change.  HENT: Negative for congestion, nosebleeds, sneezing, sore throat and trouble swallowing.   Eyes: Negative for itching and visual disturbance.  Respiratory: Negative for cough.   Cardiovascular: Negative for chest pain, palpitations and leg swelling.  Gastrointestinal: Negative for abdominal distention, blood in stool, diarrhea and nausea.  Genitourinary: Positive for frequency and urgency. Negative for hematuria.  Musculoskeletal: Positive for arthralgias and gait problem. Negative for back pain, joint swelling and neck pain.  Skin: Negative for rash.  Neurological: Positive for dizziness.  Negative for tremors, speech difficulty and weakness.  Psychiatric/Behavioral: Positive for decreased concentration. Negative for agitation, dysphoric mood and sleep disturbance. The patient is not nervous/anxious.     Objective:  BP (!) 158/96   Pulse 64   Ht 5\' 9"  (1.753 m)   Wt 164 lb (74.4 kg)   SpO2 97%   BMI 24.22 kg/m   BP Readings from Last 3 Encounters:  08/16/16 (!) 158/96  06/27/16 134/82  03/06/16 137/76    Wt Readings from Last 3 Encounters:  08/16/16 164 lb (74.4 kg)  06/27/16 160 lb 6.4 oz (72.8 kg)  03/06/16 165 lb (74.8 kg)    Physical Exam  Constitutional: He is oriented to person, place, and time. He appears well-developed. No distress.  NAD  HENT:  Mouth/Throat: Oropharynx is clear and moist.  Eyes: Conjunctivae are normal. Pupils are equal, round, and reactive to light.  Neck: Normal range of motion. No JVD present. No thyromegaly present.  Cardiovascular: Normal rate, regular rhythm, normal heart sounds and intact distal pulses.  Exam reveals no gallop and no friction rub.   No murmur heard. Pulmonary/Chest: Effort normal and breath sounds normal. No respiratory distress. He has no wheezes. He has no rales. He exhibits no tenderness.  Abdominal: Soft. Bowel sounds are normal. He exhibits no distension and no mass. There is no tenderness. There is no rebound and no guarding.  Musculoskeletal: Normal range of motion. He exhibits tenderness. He exhibits no edema.  Lymphadenopathy:    He has no cervical adenopathy.  Neurological: He is alert and oriented to person, place, and time. He has normal reflexes. No cranial nerve deficit. He exhibits normal muscle tone. He displays a negative Romberg sign. Coordination abnormal. Gait normal.  Skin: Skin is warm and dry. No rash noted.  Psychiatric: He has a normal mood and affect. His behavior is normal. Judgment and thought content normal.  ataxic a little  Lab Results  Component Value Date   WBC 8.0  11/08/2015   HGB 13.2 11/08/2015   HCT 38.6 (L) 11/08/2015   PLT 312 11/08/2015   GLUCOSE 94 11/08/2015   CHOL 171 06/21/2013   TRIG 60.0 06/21/2013   HDL 54.90 06/21/2013   LDLDIRECT 163.3 05/26/2008   LDLCALC 104 (H) 06/21/2013   ALT 17 06/21/2014   AST 26 06/21/2014   NA 140 11/08/2015   K 3.1 (L) 11/08/2015   CL 105 11/08/2015   CREATININE 1.06 11/08/2015   BUN 14 11/08/2015   CO2 25 11/08/2015   TSH 1.73 08/15/2015   PSA 0.88 08/15/2015   INR 1.12 07/20/2013    Mr Brain Wo Contrast  Result Date: 11/09/2015 CLINICAL DATA:  Initial evaluation for intermittent dizziness since yesterday. Also with blurry vision. History of prior stroke. EXAM: MRI HEAD WITHOUT CONTRAST TECHNIQUE: Multiplanar, multiecho pulse sequences of the brain and surrounding structures were obtained without intravenous contrast. COMPARISON:  Prior CT from 07/23/2013 and MRI from 07/20/2013. FINDINGS: Diffuse prominence of the CSF containing spaces is compatible with generalized age-related cerebral atrophy. Patchy T2/FLAIR hyperintensity throughout the periventricular and deep white matter both cerebral hemispheres most consistent with chronic small vessel ischemic disease, moderate nature. Small vessel type changes present within the pons. Multiple remote lacunar infarcts involve the bilateral basal ganglia, slightly worse on the right. Encephalomalacia within the right temporal occipital region related to prior hemorrhage noted. There is chronic hemosiderin staining within this region. No abnormal foci of restricted diffusion to suggest acute intracranial infarct. Gray-white matter differentiation maintained. Major intracranial vascular flow voids are preserved. No acute intracranial hemorrhage. No mass lesion, midline shift, or mass effect. No hydrocephalus. Mild ex vacuo dilatation of the right lateral ventricle related to the right temporal occipital encephalomalacia. Craniocervical junction normal. No significant  degenerative changes within the visualized upper cervical spine. Pituitary gland within normal limits. No acute abnormality about the orbits. Mild mucosal thickening within the ethmoidal air cells. Paranasal sinuses are otherwise clear. No mastoid effusion. Inner ear structures normal. Bone marrow signal intensity within normal limits. No scalp soft tissue abnormality. IMPRESSION: 1. No acute intracranial infarct or other abnormality identified. 2. Left temporal occipital encephalomalacia, consistent with prior hemorrhage seen in this region. 3. Moderate cerebral atrophy with chronic small vessel ischemic disease and multiple remote lacunar infarcts involving the bilateral basal ganglia. Electronically Signed   By: Rise Mu M.D.   On: 11/09/2015 04:16    Assessment & Plan:   There are no diagnoses linked to this encounter. I am having Mr. Meddaugh maintain his topiramate, aspirin, traMADol, gabapentin, quinapril, and hydrochlorothiazide.  No orders of the defined types were placed in this encounter.    Follow-up: No Follow-up on file.  Sonda Primes, MD

## 2016-08-16 NOTE — Assessment & Plan Note (Signed)
D/c HCTZ Labs

## 2016-08-16 NOTE — Assessment & Plan Note (Signed)
D/c HCTZ 

## 2016-08-20 ENCOUNTER — Telehealth: Payer: Self-pay | Admitting: Internal Medicine

## 2016-08-20 NOTE — Telephone Encounter (Signed)
Patient called in.  Gave MD response on labs.  Please follow up in regard to scheduling patient to come back for b12.  Dr. Camila Li wanted patient to come back next week to see him to start b12 but Dr. Camila Li is off next week and full this week.

## 2016-08-20 NOTE — Telephone Encounter (Signed)
Does he need a OV with you or a nurse visit to start b12 injections? If no OV is needed, please advise on dosing schedule.    Notes Recorded by Warden Fillers, RN on 08/20/2016 at 11:24 AM EST LMOVM to call office about lab results. ------  Notes Recorded by Cassandria Anger, MD on 08/18/2016 at 9:13 PM EST Erline Levine,  Please inform patient's dtr that all labs are normal except for low vit B12: OV next week w/me to start on B12 shots Thx

## 2016-08-20 NOTE — Telephone Encounter (Signed)
Daughter called back in regard.  States can be reached at [phone]

## 2016-09-16 ENCOUNTER — Encounter: Payer: Self-pay | Admitting: Internal Medicine

## 2016-09-16 ENCOUNTER — Ambulatory Visit (INDEPENDENT_AMBULATORY_CARE_PROVIDER_SITE_OTHER): Payer: Medicare Other | Admitting: Internal Medicine

## 2016-09-16 DIAGNOSIS — E538 Deficiency of other specified B group vitamins: Secondary | ICD-10-CM | POA: Diagnosis not present

## 2016-09-16 DIAGNOSIS — H538 Other visual disturbances: Secondary | ICD-10-CM | POA: Diagnosis not present

## 2016-09-16 DIAGNOSIS — R42 Dizziness and giddiness: Secondary | ICD-10-CM

## 2016-09-16 MED ORDER — CYANOCOBALAMIN 1000 MCG/ML IJ SOLN
1000.0000 ug | Freq: Once | INTRAMUSCULAR | Status: AC
  Administered 2016-09-16: 1000 ug via INTRAMUSCULAR

## 2016-09-16 MED ORDER — CYANOCOBALAMIN 1000 MCG/ML IJ SOLN: INTRAMUSCULAR | 6 refills | Status: DC

## 2016-09-16 MED ORDER — "SYRINGE/NEEDLE (DISP) 23G X 1"" 3 ML MISC": 2 refills | Status: DC

## 2016-09-16 NOTE — Assessment & Plan Note (Signed)
Treat B12 def 

## 2016-09-16 NOTE — Assessment & Plan Note (Signed)
Give 1000 mcg sq daily x 1 week, then weekly for 1 month, then once every 2 weeks

## 2016-09-16 NOTE — Addendum Note (Signed)
Addended by: Cresenciano Lick on: 09/16/2016 11:57 AM   Modules accepted: Orders

## 2016-09-16 NOTE — Progress Notes (Signed)
Pre visit review using our clinic review tool, if applicable. No additional management support is needed unless otherwise documented below in the visit note. 

## 2016-09-16 NOTE — Progress Notes (Signed)
Subjective:  Patient ID: Eric [name], male    DOB: [birth year]  Age: 76 y.o. MRN: [id number]  CC: No chief complaint on file.   HPI Eric [name] presents for a new B12 def, dizziness, fatigue f/u  Outpatient Medications Prior to Visit  Medication Sig Dispense Refill  . amLODipine-benazepril (LOTREL) 5-20 MG capsule Take 1 capsule by mouth daily. 90 capsule 3  . aspirin 81 MG EC tablet TAKE 1 TABLET BY MOUTH EVERY DAY 90 tablet 1  . Cholecalciferol (VITAMIN D3) 2000 units capsule Take 1 capsule (2,000 Units total) by mouth daily. 100 capsule 3  . gabapentin (NEURONTIN) 300 MG capsule 1 three times daily and 2 at bedtime 150 capsule 6  . traMADol (ULTRAM) 50 MG tablet Take 2 tablets (100 mg total) by mouth 2 (two) times daily as needed. Do not use more than twice weekly 60 tablet 1   No facility-administered medications prior to visit.     ROS Review of Systems  Constitutional: Positive for fatigue. Negative for appetite change and unexpected weight change.  HENT: Negative for congestion, nosebleeds, sneezing, sore throat and trouble swallowing.   Eyes: Negative for itching and visual disturbance.  Respiratory: Negative for cough.   Cardiovascular: Negative for chest pain, palpitations and leg swelling.  Gastrointestinal: Negative for abdominal distention, blood in stool, diarrhea and nausea.  Genitourinary: Negative for frequency and hematuria.  Musculoskeletal: Positive for gait problem. Negative for back pain, joint swelling and neck pain.  Skin: Negative for rash.  Neurological: Negative for dizziness, tremors, speech difficulty and weakness.  Psychiatric/Behavioral: Positive for decreased concentration. Negative for agitation, dysphoric mood and sleep disturbance. The patient is not nervous/anxious.     Objective:  BP 140/80   Pulse 67   Wt 168 lb (76.2 kg)   SpO2 98%   BMI 24.81 kg/m   BP Readings from Last 3 Encounters:  09/16/16 140/80  08/16/16 (!)  158/96  06/27/16 134/82    Wt Readings from Last 3 Encounters:  09/16/16 168 lb (76.2 kg)  08/16/16 164 lb (74.4 kg)  06/27/16 160 lb 6.4 oz (72.8 kg)    Physical Exam  Constitutional: He is oriented to person, place, and time. He appears well-developed. No distress.  NAD  HENT:  Mouth/Throat: Oropharynx is clear and moist.  Eyes: Conjunctivae are normal. Pupils are equal, round, and reactive to light.  Neck: Normal range of motion. No JVD present. No thyromegaly present.  Cardiovascular: Normal rate, regular rhythm, normal heart sounds and intact distal pulses.  Exam reveals no gallop and no friction rub.   No murmur heard. Pulmonary/Chest: Effort normal and breath sounds normal. No respiratory distress. He has no wheezes. He has no rales. He exhibits no tenderness.  Abdominal: Soft. Bowel sounds are normal. He exhibits no distension and no mass. There is no tenderness. There is no rebound and no guarding.  Musculoskeletal: Normal range of motion. He exhibits no edema or tenderness.  Lymphadenopathy:    He has no cervical adenopathy.  Neurological: He is alert and oriented to person, place, and time. He has normal reflexes. No cranial nerve deficit. He exhibits normal muscle tone. He displays a negative Romberg sign. Coordination abnormal. Gait normal.  Skin: Skin is warm and dry. No rash noted.  Psychiatric: He has a normal mood and affect. His behavior is normal. Judgment and thought content normal.    Lab Results  Component Value Date   WBC 8.0 11/08/2015   HGB 13.2 11/08/2015  HCT 38.6 (L) 11/08/2015   PLT 312 11/08/2015   GLUCOSE 102 (H) 08/16/2016   CHOL 186 08/16/2016   TRIG 99.0 08/16/2016   HDL 58.20 08/16/2016   LDLDIRECT 163.3 05/26/2008   LDLCALC 108 (H) 08/16/2016   ALT 12 08/16/2016   AST 17 08/16/2016   NA 138 08/16/2016   K 3.9 08/16/2016   CL 99 08/16/2016   CREATININE 0.92 08/16/2016   BUN 9 08/16/2016   CO2 34 (H) 08/16/2016   TSH 1.86 08/16/2016     PSA 0.88 08/15/2015   INR 1.12 07/20/2013    Mr Brain Wo Contrast  Result Date: 11/09/2015 CLINICAL DATA:  Initial evaluation for intermittent dizziness since yesterday. Also with blurry vision. History of prior stroke. EXAM: MRI HEAD WITHOUT CONTRAST TECHNIQUE: Multiplanar, multiecho pulse sequences of the brain and surrounding structures were obtained without intravenous contrast. COMPARISON:  Prior CT from 07/23/2013 and MRI from 07/20/2013. FINDINGS: Diffuse prominence of the CSF containing spaces is compatible with generalized age-related cerebral atrophy. Patchy T2/FLAIR hyperintensity throughout the periventricular and deep white matter both cerebral hemispheres most consistent with chronic small vessel ischemic disease, moderate nature. Small vessel type changes present within the pons. Multiple remote lacunar infarcts involve the bilateral basal ganglia, slightly worse on the right. Encephalomalacia within the right temporal occipital region related to prior hemorrhage noted. There is chronic hemosiderin staining within this region. No abnormal foci of restricted diffusion to suggest acute intracranial infarct. Gray-white matter differentiation maintained. Major intracranial vascular flow voids are preserved. No acute intracranial hemorrhage. No mass lesion, midline shift, or mass effect. No hydrocephalus. Mild ex vacuo dilatation of the right lateral ventricle related to the right temporal occipital encephalomalacia. Craniocervical junction normal. No significant degenerative changes within the visualized upper cervical spine. Pituitary gland within normal limits. No acute abnormality about the orbits. Mild mucosal thickening within the ethmoidal air cells. Paranasal sinuses are otherwise clear. No mastoid effusion. Inner ear structures normal. Bone marrow signal intensity within normal limits. No scalp soft tissue abnormality. IMPRESSION: 1. No acute intracranial infarct or other abnormality  identified. 2. Left temporal occipital encephalomalacia, consistent with prior hemorrhage seen in this region. 3. Moderate cerebral atrophy with chronic small vessel ischemic disease and multiple remote lacunar infarcts involving the bilateral basal ganglia. Electronically Signed   By: Rise Mu M.D.   On: 11/09/2015 04:16    Assessment & Plan:   There are no diagnoses linked to this encounter. I am having Mr. Stobaugh maintain his aspirin, amLODipine-benazepril, Vitamin D3, traMADol, and gabapentin.  No orders of the defined types were placed in this encounter.    Follow-up: No Follow-up on file.  Sonda Primes, MD

## 2016-10-03 ENCOUNTER — Other Ambulatory Visit: Payer: Self-pay | Admitting: Family Medicine

## 2016-10-03 DIAGNOSIS — I615 Nontraumatic intracerebral hemorrhage, intraventricular: Secondary | ICD-10-CM

## 2016-10-03 DIAGNOSIS — I10 Essential (primary) hypertension: Secondary | ICD-10-CM

## 2016-10-07 ENCOUNTER — Other Ambulatory Visit: Payer: Self-pay | Admitting: Emergency Medicine

## 2016-10-18 ENCOUNTER — Ambulatory Visit: Payer: Self-pay | Admitting: Internal Medicine

## 2016-11-02 ENCOUNTER — Other Ambulatory Visit: Payer: Self-pay | Admitting: Neurology

## 2016-11-25 ENCOUNTER — Other Ambulatory Visit: Payer: Self-pay | Admitting: Neurology

## 2016-12-18 ENCOUNTER — Other Ambulatory Visit (INDEPENDENT_AMBULATORY_CARE_PROVIDER_SITE_OTHER): Payer: Self-pay

## 2016-12-18 ENCOUNTER — Ambulatory Visit (INDEPENDENT_AMBULATORY_CARE_PROVIDER_SITE_OTHER): Payer: Medicare Other | Admitting: Internal Medicine

## 2016-12-18 ENCOUNTER — Encounter: Payer: Self-pay | Admitting: Internal Medicine

## 2016-12-18 DIAGNOSIS — E559 Vitamin D deficiency, unspecified: Secondary | ICD-10-CM

## 2016-12-18 DIAGNOSIS — I10 Essential (primary) hypertension: Secondary | ICD-10-CM

## 2016-12-18 DIAGNOSIS — E538 Deficiency of other specified B group vitamins: Secondary | ICD-10-CM

## 2016-12-18 DIAGNOSIS — I611 Nontraumatic intracerebral hemorrhage in hemisphere, cortical: Secondary | ICD-10-CM

## 2016-12-18 DIAGNOSIS — M19011 Primary osteoarthritis, right shoulder: Secondary | ICD-10-CM

## 2016-12-18 DIAGNOSIS — M1990 Unspecified osteoarthritis, unspecified site: Secondary | ICD-10-CM | POA: Insufficient documentation

## 2016-12-18 LAB — CBC WITH DIFFERENTIAL/PLATELET
BASOS ABS: 0.1 10*3/uL (ref 0.0–0.1)
Basophils Relative: 0.8 % (ref 0.0–3.0)
EOS ABS: 0.1 10*3/uL (ref 0.0–0.7)
EOS PCT: 1 % (ref 0.0–5.0)
HCT: 38 % — ABNORMAL LOW (ref 39.0–52.0)
HEMOGLOBIN: 12.5 g/dL — AB (ref 13.0–17.0)
LYMPHS ABS: 2 10*3/uL (ref 0.7–4.0)
Lymphocytes Relative: 21.1 % (ref 12.0–46.0)
MCHC: 32.9 g/dL (ref 30.0–36.0)
MCV: 83 fl (ref 78.0–100.0)
MONO ABS: 0.8 10*3/uL (ref 0.1–1.0)
Monocytes Relative: 7.9 % (ref 3.0–12.0)
NEUTROS PCT: 69.2 % (ref 43.0–77.0)
Neutro Abs: 6.7 10*3/uL (ref 1.4–7.7)
Platelets: 375 10*3/uL (ref 150.0–400.0)
RBC: 4.58 Mil/uL (ref 4.22–5.81)
RDW: 15.2 % (ref 11.5–15.5)
WBC: 9.6 10*3/uL (ref 4.0–10.5)

## 2016-12-18 LAB — VITAMIN D 25 HYDROXY (VIT D DEFICIENCY, FRACTURES): VITD: 36.86 ng/mL (ref 30.00–100.00)

## 2016-12-18 LAB — VITAMIN B12

## 2016-12-18 MED ORDER — CYANOCOBALAMIN 1000 MCG/ML IJ SOLN: INTRAMUSCULAR | 6 refills | Status: DC

## 2016-12-18 MED ORDER — TRAMADOL HCL 50 MG PO TABS: 50.0000 mg | ORAL_TABLET | Freq: Three times a day (TID) | ORAL | 1 refills | Status: DC | PRN

## 2016-12-18 NOTE — Assessment & Plan Note (Signed)
Tramadol prn ° Potential benefits of a long term opioids use as well as potential risks (i.e. addiction risk, apnea etc) and complications (i.e. Somnolence, constipation and others) were explained to the patient and were aknowledged. ° ° °

## 2016-12-18 NOTE — Progress Notes (Signed)
Pre visit review using our clinic review tool, if applicable. No additional management support is needed unless otherwise documented below in the visit note. 

## 2016-12-18 NOTE — Assessment & Plan Note (Signed)
On B12 SQ

## 2016-12-18 NOTE — Assessment & Plan Note (Signed)
Lotrel

## 2016-12-18 NOTE — Progress Notes (Signed)
Subjective:  Patient ID: Eric [name], male    DOB: 13-Oct-1940  Age: 76 y.o. MRN: [id number]  CC: No chief complaint on file.   HPI Eric [name] presents for Vit B12 def, HTN, Vit D def. C/o pain in the R shoulder  Outpatient Medications Prior to Visit  Medication Sig Dispense Refill  . amLODipine-benazepril (LOTREL) 5-20 MG capsule Take 1 capsule by mouth daily. 90 capsule 3  . aspirin 81 MG EC tablet TAKE 1 TABLET BY MOUTH EVERY DAY 90 tablet 1  . Cholecalciferol (VITAMIN D3) 2000 units capsule Take 1 capsule (2,000 Units total) by mouth daily. 100 capsule 3  . cyanocobalamin (,VITAMIN B-12,) 1000 MCG/ML injection Give 1000 mcg sq daily x 1 week, then weekly for 1 month, then once every 2 weeks 10 mL 6  . gabapentin (NEURONTIN) 300 MG capsule 1 three times daily and 2 at bedtime 150 capsule 6  . hydrochlorothiazide (HYDRODIURIL) 12.5 MG tablet TAKE 1 TABLET BY MOUTH EVERY DAY 100 tablet 4  . SYRINGE-NEEDLE, DISP, 3 ML (BD ECLIPSE SYRINGE) 23G X 1" 3 ML MISC [place] sq 50 each 2  . traMADol (ULTRAM) 50 MG tablet Take 2 tablets (100 mg total) by mouth 2 (two) times daily as needed. Do not use more than twice weekly 60 tablet 1   No facility-administered medications prior to visit.     ROS Review of Systems  Constitutional: Positive for fatigue. Negative for appetite change and unexpected weight change.  HENT: Negative for congestion, nosebleeds, sneezing, sore throat and trouble swallowing.   Eyes: Negative for itching and visual disturbance.  Respiratory: Negative for cough.   Cardiovascular: Negative for chest pain, palpitations and leg swelling.  Gastrointestinal: Negative for abdominal distention, blood in stool, diarrhea and nausea.  Genitourinary: Negative for frequency and hematuria.  Musculoskeletal: Negative for back pain, gait problem, joint swelling and neck pain.  Skin: Negative for rash.  Neurological: Negative for dizziness, tremors, speech difficulty and  weakness.  Psychiatric/Behavioral: Negative for agitation, dysphoric mood and sleep disturbance. The patient is not nervous/anxious.     Objective:  BP (!) 144/78 (BP Location: Left Arm, Patient Position: Sitting, Cuff Size: Normal)   Pulse 81   Temp 98.4 F (36.9 C) (Oral)   Ht 5\' 9"  (1.753 m)   Wt 165 lb (74.8 kg)   SpO2 98%   BMI 24.37 kg/m   BP Readings from Last 3 Encounters:  12/18/16 (!) 144/78  09/16/16 140/80  08/16/16 (!) 158/96    Wt Readings from Last 3 Encounters:  12/18/16 165 lb (74.8 kg)  09/16/16 168 lb (76.2 kg)  08/16/16 164 lb (74.4 kg)    Physical Exam  Constitutional: He is oriented to person, place, and time. He appears well-developed. No distress.  NAD  HENT:  Mouth/Throat: Oropharynx is clear and moist.  Eyes: Conjunctivae are normal. Pupils are equal, round, and reactive to light.  Neck: Normal range of motion. No JVD present. No thyromegaly present.  Cardiovascular: Normal rate, regular rhythm, normal heart sounds and intact distal pulses.  Exam reveals no gallop and no friction rub.   No murmur heard. Pulmonary/Chest: Effort normal and breath sounds normal. No respiratory distress. He has no wheezes. He has no rales. He exhibits no tenderness.  Abdominal: Soft. Bowel sounds are normal. He exhibits no distension and no mass. There is no tenderness. There is no rebound and no guarding.  Musculoskeletal: Normal range of motion. He exhibits tenderness. He exhibits no edema.  Lymphadenopathy:    He has no cervical adenopathy.  Neurological: He is alert and oriented to person, place, and time. He has normal reflexes. No cranial nerve deficit. He exhibits normal muscle tone. He displays a negative Romberg sign. Coordination and gait normal.  Skin: Skin is warm and dry. No rash noted.  Psychiatric: He has a normal mood and affect. His behavior is normal. Judgment and thought content normal.  R shoulder is tender w/ROM  Lab Results  Component Value  Date   WBC 8.0 11/08/2015   HGB 13.2 11/08/2015   HCT 38.6 (L) 11/08/2015   PLT 312 11/08/2015   GLUCOSE 102 (H) 08/16/2016   CHOL 186 08/16/2016   TRIG 99.0 08/16/2016   HDL 58.20 08/16/2016   LDLDIRECT 163.3 05/26/2008   LDLCALC 108 (H) 08/16/2016   ALT 12 08/16/2016   AST 17 08/16/2016   NA 138 08/16/2016   K 3.9 08/16/2016   CL 99 08/16/2016   CREATININE 0.92 08/16/2016   BUN 9 08/16/2016   CO2 34 (H) 08/16/2016   TSH 1.86 08/16/2016   PSA 0.88 08/15/2015   INR 1.12 07/20/2013    Mr Brain Wo Contrast  Result Date: 11/09/2015 CLINICAL DATA:  Initial evaluation for intermittent dizziness since yesterday. Also with blurry vision. History of prior stroke. EXAM: MRI HEAD WITHOUT CONTRAST TECHNIQUE: Multiplanar, multiecho pulse sequences of the brain and surrounding structures were obtained without intravenous contrast. COMPARISON:  Prior CT from 07/23/2013 and MRI from 07/20/2013. FINDINGS: Diffuse prominence of the CSF containing spaces is compatible with generalized age-related cerebral atrophy. Patchy T2/FLAIR hyperintensity throughout the periventricular and deep white matter both cerebral hemispheres most consistent with chronic small vessel ischemic disease, moderate nature. Small vessel type changes present within the pons. Multiple remote lacunar infarcts involve the bilateral basal ganglia, slightly worse on the right. Encephalomalacia within the right temporal occipital region related to prior hemorrhage noted. There is chronic hemosiderin staining within this region. No abnormal foci of restricted diffusion to suggest acute intracranial infarct. Gray-white matter differentiation maintained. Major intracranial vascular flow voids are preserved. No acute intracranial hemorrhage. No mass lesion, midline shift, or mass effect. No hydrocephalus. Mild ex vacuo dilatation of the right lateral ventricle related to the right temporal occipital encephalomalacia. Craniocervical junction  normal. No significant degenerative changes within the visualized upper cervical spine. Pituitary gland within normal limits. No acute abnormality about the orbits. Mild mucosal thickening within the ethmoidal air cells. Paranasal sinuses are otherwise clear. No mastoid effusion. Inner ear structures normal. Bone marrow signal intensity within normal limits. No scalp soft tissue abnormality. IMPRESSION: 1. No acute intracranial infarct or other abnormality identified. 2. Left temporal occipital encephalomalacia, consistent with prior hemorrhage seen in this region. 3. Moderate cerebral atrophy with chronic small vessel ischemic disease and multiple remote lacunar infarcts involving the bilateral basal ganglia. Electronically Signed   By: Rise Mu M.D.   On: 11/09/2015 04:16    Assessment & Plan:   There are no diagnoses linked to this encounter. I am having Mr. Jodoin maintain his amLODipine-benazepril, Vitamin D3, traMADol, gabapentin, cyanocobalamin, SYRINGE-NEEDLE (DISP) 3 ML, hydrochlorothiazide, and aspirin.  No orders of the defined types were placed in this encounter.    Follow-up: No Follow-up on file.  Sonda Primes, MD

## 2016-12-18 NOTE — Assessment & Plan Note (Signed)
On Vit D 

## 2016-12-26 ENCOUNTER — Ambulatory Visit (INDEPENDENT_AMBULATORY_CARE_PROVIDER_SITE_OTHER): Payer: Medicare Other | Admitting: Nurse Practitioner

## 2016-12-26 ENCOUNTER — Encounter: Payer: Self-pay | Admitting: Nurse Practitioner

## 2016-12-26 VITALS — BP 146/84 | HR 69 | Wt 168.4 lb

## 2016-12-26 DIAGNOSIS — E785 Hyperlipidemia, unspecified: Secondary | ICD-10-CM | POA: Diagnosis not present

## 2016-12-26 DIAGNOSIS — G441 Vascular headache, not elsewhere classified: Secondary | ICD-10-CM | POA: Diagnosis not present

## 2016-12-26 DIAGNOSIS — I611 Nontraumatic intracerebral hemorrhage in hemisphere, cortical: Secondary | ICD-10-CM | POA: Diagnosis not present

## 2016-12-26 DIAGNOSIS — I10 Essential (primary) hypertension: Secondary | ICD-10-CM | POA: Diagnosis not present

## 2016-12-26 NOTE — Patient Instructions (Signed)
Continue aspirin 81 mg daily  for secondary stroke prevention maintain strict control of hypertension with blood pressure goal below 130/90, today's reading 146/84   lipids with LDL cholesterol goal below 70 mg/dL.  Continue Neurontin 300 mg 3 times daily and 2 at night  I also advised the patient to eat a healthy diet with plenty of whole grains, cereals, fruits Continue   tramadol  Prescribed by Dr. Alain Marion Follow-up in  1 year

## 2016-12-26 NOTE — Progress Notes (Signed)
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  UPDATE 06/13/14 (LL): He returns for stroke follow up.  He states his headaches have persisted, but Tramadol does help if he takes 2. He feels his vision is much improved and he has adapted to his peripheral vision loss. Left arm pain persists and is most bothersome at night. Gabapentin does help but he does not often take this during the day. He did not find any benefit from Topamax for headaches and stopped it on his own.  He does not drive anymore. His daughter is concerned that he has to clear his throat often, but he states that he does not get choked on foods or drinks. He states his blood pressure is well controlled although it is elevated in the office today, 151/85. He has no new complaints.  UPDATE 12/10/13 (LL): Mr. Chernow returns for stroke follow up. He states he feels good and his headaches are getting better. He still has left peripheral vision deficit. His speech is slurred only when he is very tired. His strength has returned but complains of left arm pain at times, mostly at night. His BP is well controlled, it is 113/67 in the office today.  On 07/02/13, he presented to ER with sudden onset of slurred speech, left facial weakness and left hemiparesthesias as well as right frontotemporal headache. CT scan of the head showed a 4 cm right temporoparietal parenchymal hemorrhage with surrounding edema and mass effect. NIH stroke scale was 14. He was admitted to the intensive care unit and blood pressure tightly controlled. Admission blood pressure was elevated at 184/100. Followed imaging study showed stable appearance of the hemorrhage. MRI scan the brain confirmed the  parenchyma right temporoparietal hemorrhage with volume of 28 cubic cc. MRI of the brain showed no large vessel occlusion. Transthoracic echo showed normal ejection fraction. Carotid ultrasound showed no stenosis. EKG showed regular sinus rhythm. Urine drug screen was negative. Vascular risk factors identified included mild hyperlipidemia with LDL of 104. Patient had persistent headache and was treated with Demerol. He had left homonymous and a hemianopsia and mild left-sided weakness and sensory loss. Clinical status improved he was discharged home. He states his done well his speech and left-sided weakness and numbness is a lot better. Headaches improved but are still persistent particularly at night he has right frontal and periorbital headache which usually responds to come about but at times it is quite disabling yet. He has occasional slurred speech off and on. She states his blood pressure has been quite well controlled at home. He has an appointment with his family physician Dr. Tawanna Cooler next month. He admits to mild short-term memory difficulties but these are not bothersome. He had followup CT scan of the head done on 07/23/13 which showed expectoration or he change in the hemorrhage with mild persistent edema and mass effect.  Update 06/15/2015 : He returns for follow-up after last visit 6 months ago. He states improvement in his headaches with Topamax and tramadol. Is tolerating Topamax 50 mg twice daily without side effects and takes tramadol as needed. His headache frequency is now once or twice a week and then not as disabling. He also takes gabapentin which helps his arm pain. States his  blood pressure is usually well controlled though it is slightly elevated in office today. Patient continues to have some mild vision difficulties from his remote brain hemorrhage now more than 2 years ago. He has no new complaints. Update 11/15/2015 : Patient is seen urgently today following a recent trip to the  emergency room on 11/09/15. He is accompanied by his daughter. Patient states that he developed sudden onset of dizziness, blurred vision, staggering gait as well as right arm pain as well as chest pain. He was seen in the emergency room where an urgent MRI scan of the brain was obtained which  I personally reviewedshowed no acute abnormality. Patient's basic lab work was unremarkable except for slightly low fashion of 3.1. Her personally reviewed the films and physician notes. Patient's is unable to pinpoint as to why he had the symptoms but they recovered within a day or so. He does occasionally get some numbness in his right hand as well as some vision difficulties as well as Dr. fields at times his speech is difficult to understand and slurred. He is not had any witnessed seizure activities, significant fall, loss of consciousness. Patient has discontinued Topamax as apparently didn't help his headaches. He is also not taking gabapentin regularly. He does take 2-3 tablets of tramadol every day and states that his headaches are doing very well. He still has some vision difficulties following his deficits from remote intracerebral hemorrhage more than 2 years ago. He states his blood pressure is well controlled and today it is 128/79. Update 7/5/2017PS : He returns for follow-up after last visit 3 months ago. Is accompanied by his wife. He states that he still gets headaches off and on. He is tolerating Topamax 50 mg twice daily as well as gabapentin 303 times daily. Headache frequency may have "gone down but his headache still pretty severe. Tramadol seems to help but he ran out of his prescription. Still has some paresthesias in his hands as well. He had follow-up carotid ultrasound done on 11/29/15 showed no significant extracranial stenosis. EEG done on 12/04/15 was normal. He has not had any recurrent stroke or TIA symptoms. He remains on aspirin which is tolerating well without bruising or bleeding. States his  blood pressure is well controlled and today it is 137/76. He states his sugars also better controlled his cannot and given the past and intermittent A1c checked. He wants a refill of the tramadol UPDATE 10/26/2017CM Mr. Mcmurry, 76 year old male returns for follow-up. His headaches are much better. His daughter has now started monitoring his medications because he was taking his. Tramadol thinking it was his blood pressure medication. He is only to use that when necessary no more than twice a week. He is also on Topamax 50 mg 1 in the morning and 2 at night and she takes Neurontin 300mg  3 times daily. He is on aspirin for secondary stroke prevention and has not had further stroke or TIA symptoms. He has not had any bruising or bleeding. Blood pressure is well controlled in the office today 134/82 he returns for reevaluation  UPDATE 04/26/2018CM Mr. Tutor, 76 year old male returns for follow-up with his daughter. He reports that his headaches are much better he has stopped his Topamax. He remains on Neurontin. He remains on aspirin for secondary stroke prevention and has not had further stroke or TIA symptoms. He has no bruising and no bleeding. Blood pressure in the office today 146/84. He takes vitamin B12 shots every 2 weeks. He walks for exercise.  He denies any recent falls. He returns for reevaluation . His labs are followed by his primary care. REVIEW OF SYSTEMS: Full 14 system review of systems performed and notable only for those listed, all others are neg:  Constitutional: neg  Cardiovascular: neg Ear/Nose/Throat: neg  Skin: neg Eyes: Light sensitivity, blurred vision Respiratory: neg Gastroitestinal: neg  Hematology/Lymphatic: neg  Endocrine: Intolerance to heat and cold Musculoskeletal:neg Allergy/Immunology: neg Neurological: Headache, history of stroke Psychiatric: neg Sleep : neg   ALLERGIES: Allergies  Allergen Reactions  . Hctz [Hydrochlorothiazide]     Peeing too much     HOME MEDICATIONS: Outpatient Medications Prior to Visit  Medication Sig Dispense Refill  . amLODipine-benazepril (LOTREL) 5-20 MG capsule Take 1 capsule by mouth daily. 90 capsule 3  . aspirin 81 MG EC tablet TAKE 1 TABLET BY MOUTH EVERY DAY 90 tablet 1  . Cholecalciferol (VITAMIN D3) 2000 units capsule Take 1 capsule (2,000 Units total) by mouth daily. 100 capsule 3  . cyanocobalamin (,VITAMIN B-12,) 1000 MCG/ML injection Give 1000 mcg sq  once every 2 weeks 10 mL 6  . gabapentin (NEURONTIN) 300 MG capsule 1 three times daily and 2 at bedtime 150 capsule 6  . hydrochlorothiazide (HYDRODIURIL) 12.5 MG tablet TAKE 1 TABLET BY MOUTH EVERY DAY 100 tablet 4  . SYRINGE-NEEDLE, DISP, 3 ML (BD ECLIPSE SYRINGE) 23G X 1" 3 ML MISC [place] sq 50 each 2  . traMADol (ULTRAM) 50 MG tablet Take 1 tablet (50 mg total) by mouth 3 (three) times daily as needed for severe pain. 60 tablet 1   No facility-administered medications prior to visit.     PAST MEDICAL HISTORY: Past Medical History:  Diagnosis Date  . Benign prostatic hypertrophy   . COPD (chronic obstructive pulmonary disease) (HCC)    in past  . Difficulty reading    and writing  . ED (erectile dysfunction)   . 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 Hypertension   . Stroke [facility]) 06/2014    PAST SURGICAL HISTORY: Past Surgical History:  Procedure Laterality Date  . bilateral breast removal    . COLONOSCOPY  04/24/05    FAMILY HISTORY: Family History  Problem Relation Age of Onset  . Arthritis Mother   . Diabetes Father   . Diabetes Other   . Hypertension Other     SOCIAL HISTORY: Social History   Social History  . Marital status: Married    Spouse name: Nellie  . Number of children: 6  . Years of education: 10th   Occupational History  . retired    Social History Main Topics  . Smoking status: Former Smoker    Quit date: 09/22/1999  . Smokeless tobacco: Never Used  . Alcohol use No  . Drug use: No  .  Sexual activity: Yes   Other Topics Concern  . Not on file   Social History Narrative   Patient lives at home with wife, consumes no caffeine              PHYSICAL EXAM  Vitals:   12/26/16 1521  BP: (!) 146/84  Pulse: 69  Weight: 168 lb 6.4 oz (76.4 kg)   Body mass index is 24.87 kg/m.  Generalized: Well developed, in no acute distress  Head: normocephalic and atraumatic,. Oropharynx benign  Neck: Supple, no carotid bruits  Cardiac: Regular rate rhythm, no murmur  Musculoskeletal: No deformity   Neurological examination   Mentation: Alert oriented to time, place, history taking.  Attention span and concentration appropriate. Recent and remote memory intact.  Follows all commands speech and language fluent.  Cranial nerve II-XII: .Pupils were equal round reactive to light extraocular movements were full, Visual fields showed partial left homonymous hemianopsia to confrontation. Facial sensation and strength were normal. hearing was intact to finger rubbing bilaterally. Uvula tongue midline. head turning and shoulder shrug were normal and symmetric.Tongue protrusion into cheek strength was normal. Motor: normal bulk and tone, full strength in the BUE, BLE, except mild increased tone in the left leg with stiffness Sensory: normal and symmetric to light touch, pinprick, and  Vibration, in the upper and lower extremities Coordination: finger-nose-finger, heel-to-shin bilaterally, no dysmetria Reflexes: 1+ upper lower and symmetric, plantar responses were flexor bilaterally. Gait and Station: Rising up from seated position without assistance, normal stance,  moderate stride, good arm swing, smooth turning, able to perform tiptoe, and heel walking without difficulty. Tandem gait is steady DIAGNOSTIC DATA (LABS, IMAGING, TESTING) - I reviewed patient records, labs, notes, testing and imaging myself where available.  Lab Results  Component Value Date   WBC 9.6 12/18/2016   HGB 12.5  (L) 12/18/2016   HCT 38.0 (L) 12/18/2016   MCV 83.0 12/18/2016   PLT 375.0 12/18/2016      Component Value Date/Time   NA 138 08/16/2016 1647   K 3.9 08/16/2016 1647   CL 99 08/16/2016 1647   CO2 34 (H) 08/16/2016 1647   GLUCOSE 102 (H) 08/16/2016 1647   BUN 9 08/16/2016 1647   CREATININE 0.92 08/16/2016 1647   CALCIUM 9.7 08/16/2016 1647   PROT 7.8 08/16/2016 1647   ALBUMIN 4.3 08/16/2016 1647   AST 17 08/16/2016 1647   ALT 12 08/16/2016 1647   ALKPHOS 74 08/16/2016 1647   BILITOT 0.5 08/16/2016 1647   GFRNONAA >60 11/08/2015 2151   GFRAA >60 11/08/2015 2151    ASSESSMENT AND PLAN 48 year african [place] male with a 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 Clinical doing well with right frontal headaches, and left hemianopsia.   Tramadol has been effective for symptomatic relief of headache and arm pain . Gabapentin has also been beneficial     PLAN: Continue aspirin 81 mg daily  for secondary stroke prevention maintain strict control of hypertension with blood pressure goal below 130/90, today's reading 146/84   lipids with LDL cholesterol goal below 70 mg/dL. Followed by PCP Continue Neurontin 300 mg 3 times daily and 2 at night  I also advised the patient to eat a healthy diet with plenty of whole grains, cereals, fruits Continue   tramadol  Prescribed by Dr. Posey Rea Follow-up in  1 year I spent 20 min  in total face to face time with the patient and daughter more than 50% of which was spent counseling and coordination of care, reviewing test results reviewing medications and discussing and reviewing the diagnosis of stroke and monitoring risk factors as well as control of headaches. , Nilda Riggs, [facility], [facility], APRN  [facility] [address], [address] [place], [place] [address] [phone]

## 2016-12-28 NOTE — Progress Notes (Signed)
I agree with the above plan 

## 2017-03-25 ENCOUNTER — Ambulatory Visit (INDEPENDENT_AMBULATORY_CARE_PROVIDER_SITE_OTHER): Payer: Medicare Other | Admitting: Internal Medicine

## 2017-03-25 ENCOUNTER — Encounter: Payer: Self-pay | Admitting: Internal Medicine

## 2017-03-25 DIAGNOSIS — G441 Vascular headache, not elsewhere classified: Secondary | ICD-10-CM

## 2017-03-25 DIAGNOSIS — R42 Dizziness and giddiness: Secondary | ICD-10-CM | POA: Diagnosis not present

## 2017-03-25 DIAGNOSIS — E538 Deficiency of other specified B group vitamins: Secondary | ICD-10-CM | POA: Diagnosis not present

## 2017-03-25 DIAGNOSIS — E559 Vitamin D deficiency, unspecified: Secondary | ICD-10-CM

## 2017-03-25 DIAGNOSIS — I611 Nontraumatic intracerebral hemorrhage in hemisphere, cortical: Secondary | ICD-10-CM

## 2017-03-25 MED ORDER — TRAMADOL HCL 50 MG PO TABS: 50.0000 mg | ORAL_TABLET | Freq: Three times a day (TID) | ORAL | 1 refills | Status: DC | PRN

## 2017-03-25 MED ORDER — VITAMIN B-12 1000 MCG SL SUBL: 1.0000 | SUBLINGUAL_TABLET | Freq: Every day | SUBLINGUAL | 3 refills | Status: DC

## 2017-03-25 NOTE — Assessment & Plan Note (Signed)
On B12 tabs - feeling better

## 2017-03-25 NOTE — Assessment & Plan Note (Signed)
No relapse 

## 2017-03-25 NOTE — Progress Notes (Signed)
Subjective:  Patient ID: Eric [name], male    DOB: 1941/01/11  Age: 76 y.o. MRN: [id number]  CC: No chief complaint on file.   HPI Eric [name] presents for ICH, HAs, HTN, B12 def f/u. Feeling better  Outpatient Medications Prior to Visit  Medication Sig Dispense Refill  . amLODipine-benazepril (LOTREL) 5-20 MG capsule Take 1 capsule by mouth daily. 90 capsule 3  . aspirin 81 MG EC tablet TAKE 1 TABLET BY MOUTH EVERY DAY 90 tablet 1  . Cholecalciferol (VITAMIN D3) 2000 units capsule Take 1 capsule (2,000 Units total) by mouth daily. 100 capsule 3  . cyanocobalamin (,VITAMIN B-12,) 1000 MCG/ML injection Give 1000 mcg sq  once every 2 weeks 10 mL 6  . gabapentin (NEURONTIN) 300 MG capsule 1 three times daily and 2 at bedtime 150 capsule 6  . hydrochlorothiazide (HYDRODIURIL) 12.5 MG tablet TAKE 1 TABLET BY MOUTH EVERY DAY 100 tablet 4  . SYRINGE-NEEDLE, DISP, 3 ML (BD ECLIPSE SYRINGE) 23G X 1" 3 ML MISC [place] sq 50 each 2  . traMADol (ULTRAM) 50 MG tablet Take 1 tablet (50 mg total) by mouth 3 (three) times daily as needed for severe pain. 60 tablet 1   No facility-administered medications prior to visit.     ROS Review of Systems  Constitutional: Negative for appetite change, fatigue and unexpected weight change.  HENT: Negative for congestion, nosebleeds, sneezing, sore throat and trouble swallowing.   Eyes: Negative for itching and visual disturbance.  Respiratory: Negative for cough.   Cardiovascular: Negative for chest pain, palpitations and leg swelling.  Gastrointestinal: Negative for abdominal distention, blood in stool, diarrhea and nausea.  Genitourinary: Negative for frequency and hematuria.  Musculoskeletal: Positive for gait problem. Negative for back pain, joint swelling and neck pain.  Skin: Negative for rash.  Neurological: Negative for dizziness, tremors, speech difficulty and weakness.  Psychiatric/Behavioral: Negative for agitation, dysphoric mood and  sleep disturbance. The patient is not nervous/anxious.     Objective:  BP 132/70 (BP Location: Left Arm, Patient Position: Sitting, Cuff Size: Normal)   Pulse 74   Temp 98.4 F (36.9 C) (Oral)   Ht 5\' 9"  (1.753 m)   Wt 166 lb (75.3 kg)   SpO2 98%   BMI 24.51 kg/m   BP Readings from Last 3 Encounters:  03/25/17 132/70  12/26/16 (!) 146/84  12/18/16 (!) 144/78    Wt Readings from Last 3 Encounters:  03/25/17 166 lb (75.3 kg)  12/26/16 168 lb 6.4 oz (76.4 kg)  12/18/16 165 lb (74.8 kg)    Physical Exam  Constitutional: He is oriented to person, place, and time. He appears well-developed. No distress.  NAD  HENT:  Mouth/Throat: Oropharynx is clear and moist.  Eyes: Pupils are equal, round, and reactive to light. Conjunctivae are normal.  Neck: Normal range of motion. No JVD present. No thyromegaly present.  Cardiovascular: Normal rate, regular rhythm, normal heart sounds and intact distal pulses.  Exam reveals no gallop and no friction rub.   No murmur heard. Pulmonary/Chest: Effort normal and breath sounds normal. No respiratory distress. He has no wheezes. He has no rales. He exhibits no tenderness.  Abdominal: Soft. Bowel sounds are normal. He exhibits no distension and no mass. There is no tenderness. There is no rebound and no guarding.  Musculoskeletal: Normal range of motion. He exhibits no edema or tenderness.  Lymphadenopathy:    He has no cervical adenopathy.  Neurological: He is alert and oriented to person,  place, and time. He has normal reflexes. No cranial nerve deficit. He exhibits normal muscle tone. He displays a negative Romberg sign. Coordination and gait normal.  Skin: Skin is warm and dry. No rash noted.  Psychiatric: He has a normal mood and affect. His behavior is normal. Judgment and thought content normal.    Lab Results  Component Value Date   WBC 9.6 12/18/2016   HGB 12.5 (L) 12/18/2016   HCT 38.0 (L) 12/18/2016   PLT 375.0 12/18/2016    GLUCOSE 102 (H) 08/16/2016   CHOL 186 08/16/2016   TRIG 99.0 08/16/2016   HDL 58.20 08/16/2016   LDLDIRECT 163.3 05/26/2008   LDLCALC 108 (H) 08/16/2016   ALT 12 08/16/2016   AST 17 08/16/2016   NA 138 08/16/2016   K 3.9 08/16/2016   CL 99 08/16/2016   CREATININE 0.92 08/16/2016   BUN 9 08/16/2016   CO2 34 (H) 08/16/2016   TSH 1.86 08/16/2016   PSA 0.88 08/15/2015   INR 1.12 07/20/2013    Mr Brain Wo Contrast  Result Date: 11/09/2015 CLINICAL DATA:  Initial evaluation for intermittent dizziness since yesterday. Also with blurry vision. History of prior stroke. EXAM: MRI HEAD WITHOUT CONTRAST TECHNIQUE: Multiplanar, multiecho pulse sequences of the brain and surrounding structures were obtained without intravenous contrast. COMPARISON:  Prior CT from 07/23/2013 and MRI from 07/20/2013. FINDINGS: Diffuse prominence of the CSF containing spaces is compatible with generalized age-related cerebral atrophy. Patchy T2/FLAIR hyperintensity throughout the periventricular and deep white matter both cerebral hemispheres most consistent with chronic small vessel ischemic disease, moderate nature. Small vessel type changes present within the pons. Multiple remote lacunar infarcts involve the bilateral basal ganglia, slightly worse on the right. Encephalomalacia within the right temporal occipital region related to prior hemorrhage noted. There is chronic hemosiderin staining within this region. No abnormal foci of restricted diffusion to suggest acute intracranial infarct. Gray-white matter differentiation maintained. Major intracranial vascular flow voids are preserved. No acute intracranial hemorrhage. No mass lesion, midline shift, or mass effect. No hydrocephalus. Mild ex vacuo dilatation of the right lateral ventricle related to the right temporal occipital encephalomalacia. Craniocervical junction normal. No significant degenerative changes within the visualized upper cervical spine. Pituitary gland  within normal limits. No acute abnormality about the orbits. Mild mucosal thickening within the ethmoidal air cells. Paranasal sinuses are otherwise clear. No mastoid effusion. Inner ear structures normal. Bone marrow signal intensity within normal limits. No scalp soft tissue abnormality. IMPRESSION: 1. No acute intracranial infarct or other abnormality identified. 2. Left temporal occipital encephalomalacia, consistent with prior hemorrhage seen in this region. 3. Moderate cerebral atrophy with chronic small vessel ischemic disease and multiple remote lacunar infarcts involving the bilateral basal ganglia. Electronically Signed   By: Rise Mu M.D.   On: 11/09/2015 04:16    Assessment & Plan:   There are no diagnoses linked to this encounter. I am having Eric [name] maintain his amLODipine-benazepril, Vitamin D3, gabapentin, SYRINGE-NEEDLE (DISP) 3 ML, hydrochlorothiazide, aspirin, cyanocobalamin, and traMADol.  No orders of the defined types were placed in this encounter.    Follow-up: No Follow-up on file.  Sonda Primes, MD

## 2017-03-25 NOTE — Assessment & Plan Note (Signed)
Vit D 

## 2017-03-25 NOTE — Assessment & Plan Note (Signed)
Resolved

## 2017-03-25 NOTE — Assessment & Plan Note (Signed)
No relapse. No HAs 

## 2017-07-30 ENCOUNTER — Ambulatory Visit: Payer: Self-pay | Admitting: Internal Medicine

## 2017-07-30 ENCOUNTER — Encounter: Payer: Self-pay | Admitting: Internal Medicine

## 2017-07-30 ENCOUNTER — Ambulatory Visit (INDEPENDENT_AMBULATORY_CARE_PROVIDER_SITE_OTHER): Payer: Medicare Other | Admitting: Internal Medicine

## 2017-07-30 ENCOUNTER — Other Ambulatory Visit (INDEPENDENT_AMBULATORY_CARE_PROVIDER_SITE_OTHER): Payer: Medicare Other

## 2017-07-30 VITALS — BP 136/86 | HR 68 | Temp 97.9°F | Ht 69.0 in | Wt 167.0 lb

## 2017-07-30 DIAGNOSIS — N401 Enlarged prostate with lower urinary tract symptoms: Secondary | ICD-10-CM

## 2017-07-30 DIAGNOSIS — E785 Hyperlipidemia, unspecified: Secondary | ICD-10-CM

## 2017-07-30 DIAGNOSIS — E538 Deficiency of other specified B group vitamins: Secondary | ICD-10-CM

## 2017-07-30 DIAGNOSIS — R351 Nocturia: Secondary | ICD-10-CM

## 2017-07-30 DIAGNOSIS — Z0000 Encounter for general adult medical examination without abnormal findings: Secondary | ICD-10-CM | POA: Diagnosis not present

## 2017-07-30 DIAGNOSIS — E559 Vitamin D deficiency, unspecified: Secondary | ICD-10-CM

## 2017-07-30 LAB — HEPATIC FUNCTION PANEL
ALBUMIN: 4.1 g/dL (ref 3.5–5.2)
ALT: 11 U/L (ref 0–53)
AST: 16 U/L (ref 0–37)
Alkaline Phosphatase: 78 U/L (ref 39–117)
BILIRUBIN DIRECT: 0.1 mg/dL (ref 0.0–0.3)
TOTAL PROTEIN: 7.5 g/dL (ref 6.0–8.3)
Total Bilirubin: 0.4 mg/dL (ref 0.2–1.2)

## 2017-07-30 LAB — BASIC METABOLIC PANEL
BUN: 8 mg/dL (ref 6–23)
CO2: 29 mEq/L (ref 19–32)
Calcium: 9.8 mg/dL (ref 8.4–10.5)
Chloride: 104 mEq/L (ref 96–112)
Creatinine, Ser: 0.99 mg/dL (ref 0.40–1.50)
GFR: 94.35 mL/min (ref >60.00)
Glucose, Bld: 103 mg/dL — ABNORMAL HIGH (ref 70–99)
POTASSIUM: 4.1 meq/L (ref 3.5–5.1)
Sodium: 139 mEq/L (ref 135–145)

## 2017-07-30 LAB — URINALYSIS
Bilirubin Urine: NEGATIVE
Hgb urine dipstick: NEGATIVE
Ketones, ur: NEGATIVE
Leukocytes, UA: NEGATIVE
Nitrite: NEGATIVE
PH: 6 (ref 5.0–8.0)
SPECIFIC GRAVITY, URINE: 1.02 (ref 1.000–1.030)
TOTAL PROTEIN, URINE-UPE24: NEGATIVE
URINE GLUCOSE: NEGATIVE
Urobilinogen, UA: 0.2 (ref 0.0–1.0)

## 2017-07-30 LAB — CBC WITH DIFFERENTIAL/PLATELET
BASOS PCT: 0.9 % (ref 0.0–3.0)
Basophils Absolute: 0.1 10*3/uL (ref 0.0–0.1)
EOS PCT: 1.2 % (ref 0.0–5.0)
Eosinophils Absolute: 0.1 10*3/uL (ref 0.0–0.7)
HEMATOCRIT: 37.1 % — AB (ref 39.0–52.0)
HEMOGLOBIN: 11.9 g/dL — AB (ref 13.0–17.0)
LYMPHS PCT: 31.1 % (ref 12.0–46.0)
Lymphs Abs: 2.6 10*3/uL (ref 0.7–4.0)
MCHC: 32.1 g/dL (ref 30.0–36.0)
MCV: 80.6 fl (ref 78.0–100.0)
MONO ABS: 0.5 10*3/uL (ref 0.1–1.0)
Monocytes Relative: 6.5 % (ref 3.0–12.0)
Neutro Abs: 5.1 10*3/uL (ref 1.4–7.7)
Neutrophils Relative %: 60.3 % (ref 43.0–77.0)
Platelets: 365 10*3/uL (ref 150.0–400.0)
RBC: 4.6 Mil/uL (ref 4.22–5.81)
RDW: 16.4 % — AB (ref 11.5–15.5)
WBC: 8.4 10*3/uL (ref 4.0–10.5)

## 2017-07-30 LAB — TSH: TSH: 1.63 u[IU]/mL (ref 0.35–4.50)

## 2017-07-30 LAB — VITAMIN B12: Vitamin B-12: >1500 pg/mL — ABNORMAL HIGH (ref 211–911)

## 2017-07-30 LAB — LIPID PANEL
CHOLESTEROL: 173 mg/dL (ref 0–200)
HDL: 48.9 mg/dL (ref >39.00)
LDL Cholesterol: 104 mg/dL — ABNORMAL HIGH (ref 0–99)
NonHDL: 124.53
Total CHOL/HDL Ratio: 4
Triglycerides: 104 mg/dL (ref 0.0–149.0)
VLDL: 20.8 mg/dL (ref 0.0–40.0)

## 2017-07-30 LAB — VITAMIN D 25 HYDROXY (VIT D DEFICIENCY, FRACTURES): VITD: 34.64 ng/mL (ref 30.00–100.00)

## 2017-07-30 MED ORDER — TRAMADOL HCL 50 MG PO TABS: 50.0000 mg | ORAL_TABLET | Freq: Three times a day (TID) | ORAL | 3 refills | Status: DC | PRN

## 2017-07-30 NOTE — Assessment & Plan Note (Signed)
?  worse UA, PSA

## 2017-07-30 NOTE — Assessment & Plan Note (Signed)
On Vit D 

## 2017-07-30 NOTE — Progress Notes (Signed)
Subjective:  Patient ID: Eric [name], male    DOB: Jan 19, 1941  Age: 76 y.o. MRN: [id number]  CC: No chief complaint on file.   HPI Eric [name] presents for a well exam C/o frequent urination x 3 weeks F/u HTN, Vit  B12 def, OA  Outpatient Medications Prior to Visit  Medication Sig Dispense Refill  . amLODipine-benazepril (LOTREL) 5-20 MG capsule Take 1 capsule by mouth daily. 90 capsule 3  . aspirin 81 MG EC tablet TAKE 1 TABLET BY MOUTH EVERY DAY 90 tablet 1  . Cholecalciferol (VITAMIN D3) 2000 units capsule Take 1 capsule (2,000 Units total) by mouth daily. 100 capsule 3  . Cyanocobalamin (VITAMIN B-12) 1000 MCG SUBL Place 1 tablet (1,000 mcg total) under the tongue daily. 100 tablet 3  . gabapentin (NEURONTIN) 300 MG capsule 1 three times daily and 2 at bedtime 150 capsule 6  . hydrochlorothiazide (HYDRODIURIL) 12.5 MG tablet TAKE 1 TABLET BY MOUTH EVERY DAY 100 tablet 4  . traMADol (ULTRAM) 50 MG tablet Take 1 tablet (50 mg total) by mouth 3 (three) times daily as needed for severe pain. 60 tablet 1   No facility-administered medications prior to visit.     ROS Review of Systems  Constitutional: Negative for appetite change, fatigue and unexpected weight change.  HENT: Negative for congestion, nosebleeds, sneezing, sore throat and trouble swallowing.   Eyes: Negative for itching and visual disturbance.  Respiratory: Negative for cough.   Cardiovascular: Negative for chest pain, palpitations and leg swelling.  Gastrointestinal: Negative for abdominal distention, blood in stool, diarrhea and nausea.  Genitourinary: Positive for frequency. Negative for hematuria.  Musculoskeletal: Positive for arthralgias. Negative for back pain, gait problem, joint swelling and neck pain.  Skin: Negative for rash.  Neurological: Negative for dizziness, tremors, speech difficulty and weakness.  Psychiatric/Behavioral: Negative for agitation, dysphoric mood, sleep disturbance and  suicidal ideas. The patient is not nervous/anxious.     Objective:  BP 136/86 (BP Location: Left Arm, Patient Position: Sitting, Cuff Size: Normal)   Pulse 68   Temp 97.9 F (36.6 C) (Oral)   Ht 5\' 9"  (1.753 m)   Wt 167 lb (75.8 kg)   SpO2 98%   BMI 24.66 kg/m   BP Readings from Last 3 Encounters:  07/30/17 136/86  03/25/17 132/70  12/26/16 (!) 146/84    Wt Readings from Last 3 Encounters:  07/30/17 167 lb (75.8 kg)  03/25/17 166 lb (75.3 kg)  12/26/16 168 lb 6.4 oz (76.4 kg)    Physical Exam  Constitutional: He is oriented to person, place, and time. He appears well-developed and well-nourished. No distress.  NAD  HENT:  Head: Normocephalic and atraumatic.  Right Ear: External ear normal.  Left Ear: External ear normal.  Nose: Nose normal.  Mouth/Throat: Oropharynx is clear and moist. No oropharyngeal exudate.  Eyes: Conjunctivae and EOM are normal. Pupils are equal, round, and reactive to light. Right eye exhibits no discharge. Left eye exhibits no discharge. No scleral icterus.  Neck: Normal range of motion. Neck supple. No JVD present. No tracheal deviation present. No thyromegaly present.  Cardiovascular: Normal rate, regular rhythm, normal heart sounds and intact distal pulses. Exam reveals no gallop and no friction rub.  No murmur heard. Pulmonary/Chest: Effort normal and breath sounds normal. No stridor. No respiratory distress. He has no wheezes. He has no rales. He exhibits no tenderness.  Abdominal: Soft. Bowel sounds are normal. He exhibits no distension and no mass. There is no  tenderness. There is no rebound and no guarding.  Genitourinary: Rectum normal and penis normal. Rectal exam shows guaiac negative stool. No penile tenderness.  Musculoskeletal: Normal range of motion. He exhibits no edema or tenderness.  Lymphadenopathy:    He has no cervical adenopathy.  Neurological: He is alert and oriented to person, place, and time. He has normal reflexes. No  cranial nerve deficit. He exhibits normal muscle tone. He displays a negative Romberg sign. Coordination and gait normal.  Skin: Skin is warm and dry. No rash noted. He is not diaphoretic. No erythema. No pallor.  Psychiatric: He has a normal mood and affect. His behavior is normal. Judgment and thought content normal.  prostate 1+  Lab Results  Component Value Date   WBC 9.6 12/18/2016   HGB 12.5 (L) 12/18/2016   HCT 38.0 (L) 12/18/2016   PLT 375.0 12/18/2016   GLUCOSE 102 (H) 08/16/2016   CHOL 186 08/16/2016   TRIG 99.0 08/16/2016   HDL 58.20 08/16/2016   LDLDIRECT 163.3 05/26/2008   LDLCALC 108 (H) 08/16/2016   ALT 12 08/16/2016   AST 17 08/16/2016   NA 138 08/16/2016   K 3.9 08/16/2016   CL 99 08/16/2016   CREATININE 0.92 08/16/2016   BUN 9 08/16/2016   CO2 34 (H) 08/16/2016   TSH 1.86 08/16/2016   PSA 0.88 08/15/2015   INR 1.12 07/20/2013    Mr Brain Wo Contrast  Result Date: 11/09/2015 CLINICAL DATA:  Initial evaluation for intermittent dizziness since yesterday. Also with blurry vision. History of prior stroke. EXAM: MRI HEAD WITHOUT CONTRAST TECHNIQUE: Multiplanar, multiecho pulse sequences of the brain and surrounding structures were obtained without intravenous contrast. COMPARISON:  Prior CT from 07/23/2013 and MRI from 07/20/2013. FINDINGS: Diffuse prominence of the CSF containing spaces is compatible with generalized age-related cerebral atrophy. Patchy T2/FLAIR hyperintensity throughout the periventricular and deep white matter both cerebral hemispheres most consistent with chronic small vessel ischemic disease, moderate nature. Small vessel type changes present within the pons. Multiple remote lacunar infarcts involve the bilateral basal ganglia, slightly worse on the right. Encephalomalacia within the right temporal occipital region related to prior hemorrhage noted. There is chronic hemosiderin staining within this region. No abnormal foci of restricted diffusion to  suggest acute intracranial infarct. Gray-white matter differentiation maintained. Major intracranial vascular flow voids are preserved. No acute intracranial hemorrhage. No mass lesion, midline shift, or mass effect. No hydrocephalus. Mild ex vacuo dilatation of the right lateral ventricle related to the right temporal occipital encephalomalacia. Craniocervical junction normal. No significant degenerative changes within the visualized upper cervical spine. Pituitary gland within normal limits. No acute abnormality about the orbits. Mild mucosal thickening within the ethmoidal air cells. Paranasal sinuses are otherwise clear. No mastoid effusion. Inner ear structures normal. Bone marrow signal intensity within normal limits. No scalp soft tissue abnormality. IMPRESSION: 1. No acute intracranial infarct or other abnormality identified. 2. Left temporal occipital encephalomalacia, consistent with prior hemorrhage seen in this region. 3. Moderate cerebral atrophy with chronic small vessel ischemic disease and multiple remote lacunar infarcts involving the bilateral basal ganglia. Electronically Signed   By: Rise Mu M.D.   On: 11/09/2015 04:16    Assessment & Plan:   There are no diagnoses linked to this encounter. I am having Eric [name] maintain his amLODipine-benazepril, Vitamin D3, gabapentin, hydrochlorothiazide, aspirin, Vitamin B-12, and traMADol.  No orders of the defined types were placed in this encounter.    Follow-up: No Follow-up on file.  Eric  Kelso Bibby, MD

## 2017-07-30 NOTE — Assessment & Plan Note (Signed)
Here for medicare wellness/physical  Diet: heart healthy  Physical activity: not sedentary  Depression/mood screen: negative  Hearing: intact to whispered voice  Visual acuity: grossly normal/reading glasses, performs annual eye exam  ADLs: capable  Fall risk: low  Home safety: good  Cognitive evaluation: intact to orientation, naming, recall and repetition  EOL planning: adv directives, full code/ I agree  I have personally reviewed and have noted  1. The patient's medical, surgical and social history  2. Their use of alcohol, tobacco or illicit drugs  3. Their current medications and supplements  4. The patient's functional ability including ADL's, fall risks, home safety risks and hearing or visual impairment.  5. Diet and physical activities  6. Evidence for depression or mood disorders 7. The roster of all physicians providing medical care to patient - is listed in the Snapshot section of the chart and reviewed today.    Today patient counseled on age appropriate routine health concerns for screening and prevention, each reviewed and up to date or declined. Immunizations reviewed and up to date or declined. Labs ordered and reviewed. Risk factors for depression reviewed and negative. Hearing function and visual acuity are intact. ADLs screened and addressed as needed. Functional ability and level of safety reviewed and appropriate. Education, counseling and referrals performed based on assessed risks today. Patient provided with a copy of personalized plan for preventive services.

## 2017-07-30 NOTE — Patient Instructions (Signed)

## 2017-07-30 NOTE — Assessment & Plan Note (Signed)
On B12 

## 2017-07-30 NOTE — Assessment & Plan Note (Signed)
labs

## 2017-08-07 ENCOUNTER — Other Ambulatory Visit: Payer: Self-pay | Admitting: Internal Medicine

## 2017-10-13 DIAGNOSIS — H25813 Combined forms of age-related cataract, bilateral: Secondary | ICD-10-CM | POA: Diagnosis not present

## 2017-10-13 DIAGNOSIS — I63411 Cerebral infarction due to embolism of right middle cerebral artery: Secondary | ICD-10-CM | POA: Diagnosis not present

## 2017-10-13 DIAGNOSIS — H5703 Miosis: Secondary | ICD-10-CM | POA: Diagnosis not present

## 2017-10-13 DIAGNOSIS — H53462 Homonymous bilateral field defects, left side: Secondary | ICD-10-CM | POA: Diagnosis not present

## 2017-10-13 DIAGNOSIS — H35371 Puckering of macula, right eye: Secondary | ICD-10-CM | POA: Diagnosis not present

## 2017-10-25 IMAGING — MR MR HEAD W/O CM
9 of 10 series · 34 of 48 positions shown · non-contrast
Comparison: Prior CT from 07/23/2013 and MRI from 07/20/2013.

CLINICAL DATA: Initial evaluation for intermittent dizziness since
yesterday. Also with blurry vision. History of prior stroke.

EXAM:
MRI HEAD WITHOUT CONTRAST
TECHNIQUE: Multiplanar, multiecho pulse sequences of the brain and surrounding
structures were obtained without intravenous contrast.

[Series 2: FLAIR · sagittal · 5.0mm · 0.47mm/px · 2 of 23 slices shown (1 of 2)]
[im 1/23]
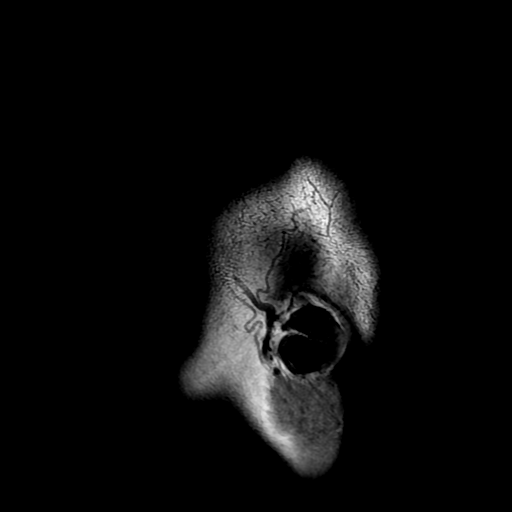
[im 23/23]
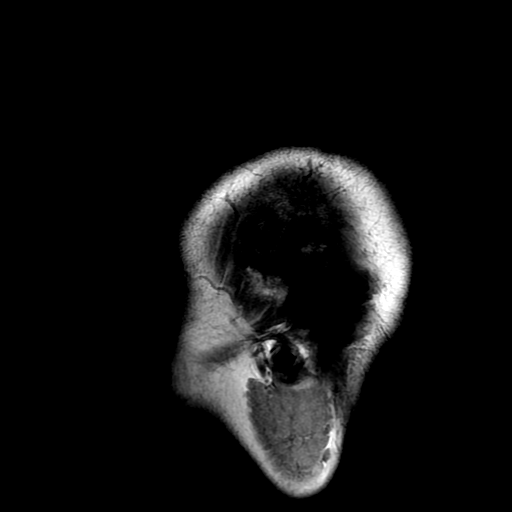

[Series 4: DWI · axial · 3.6mm · 0.94mm/px · z∈[-104,+33]mm · 8 of 82 slices shown (1 of 4)]
[im 1/82]
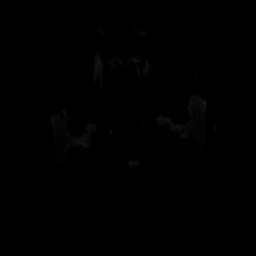
[im 12/82]
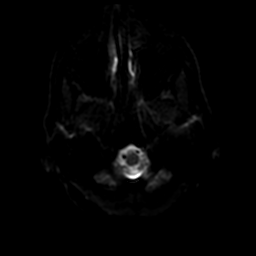
[im 24/82]
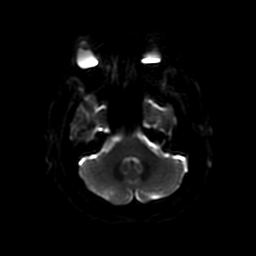
[im 35/82]
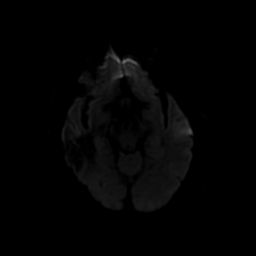
[im 47/82]
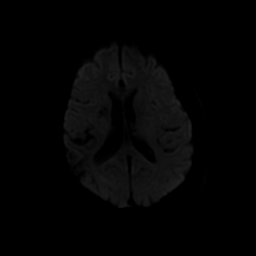
[im 58/82]
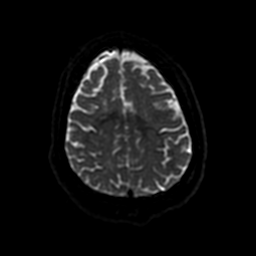
[im 70/82]
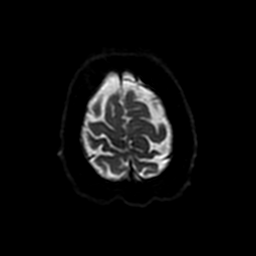
[im 82/82]
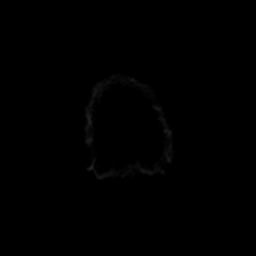

[Series 5: T2 · axial · 5.0mm · 0.43mm/px · z∈[-101,+36]mm · 3 of 25 slices shown (1 of 2)]
[im 1/25]
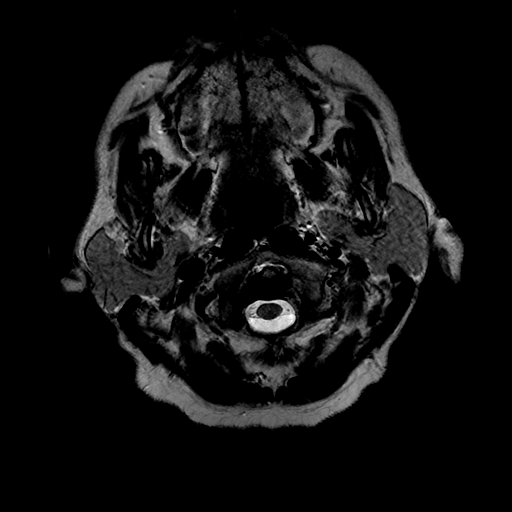
[im 13/25]
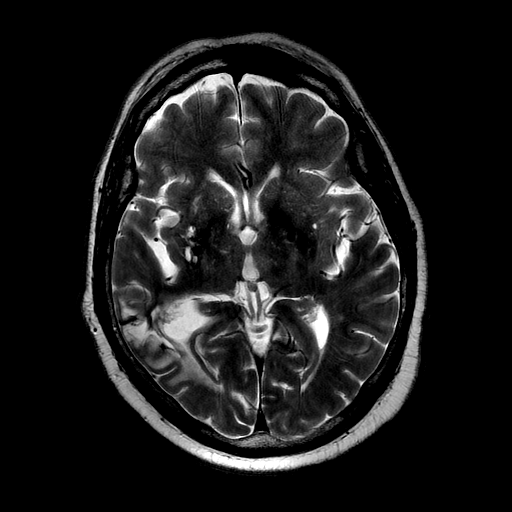
[im 25/25]
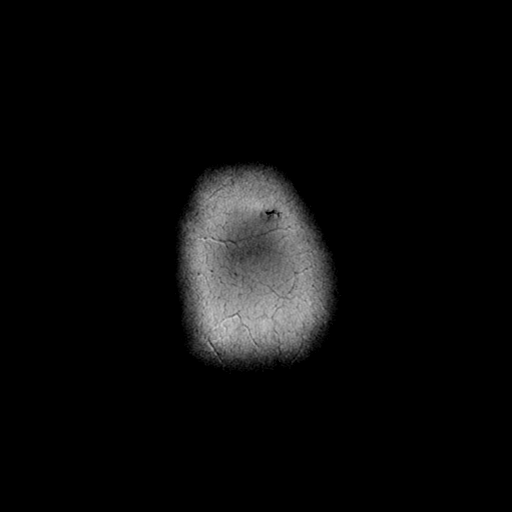

[Series 6: FLAIR · axial · 5.0mm · 0.43mm/px · z∈[-101,+36]mm · 3 of 25 slices shown (2 of 2)]
[im 1/25]
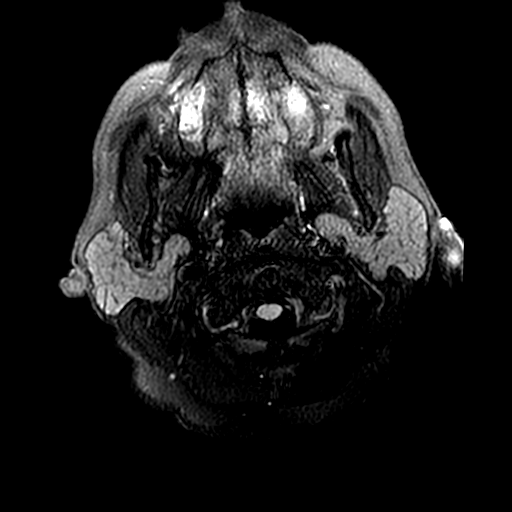
[im 13/25]
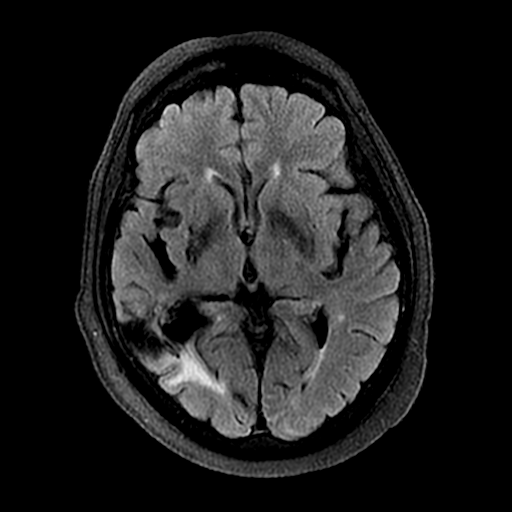
[im 25/25]
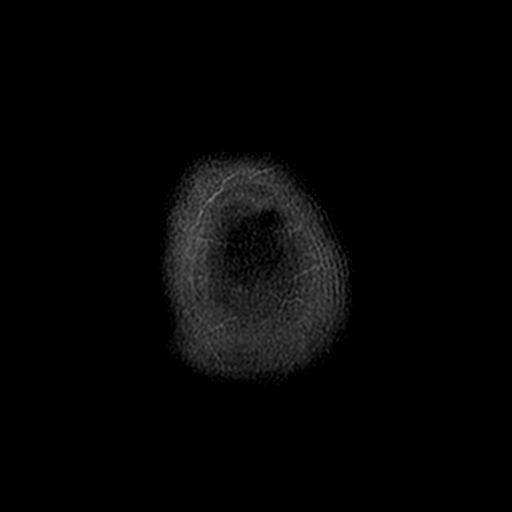

[Series 7: DWI · coronal · 5.0mm · 0.94mm/px · 7 of 67 slices shown (2 of 4)]
[im 1/67]
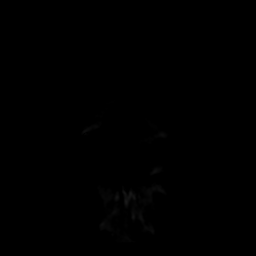
[im 12/67]
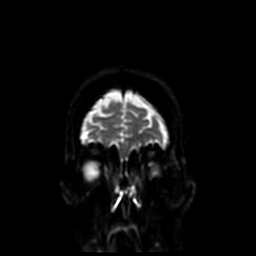
[im 23/67]
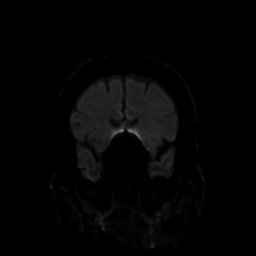
[im 34/67]
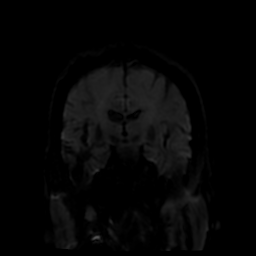
[im 45/67]
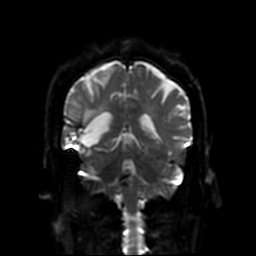
[im 56/67]
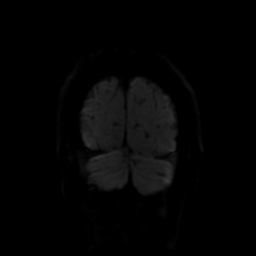
[im 67/67]
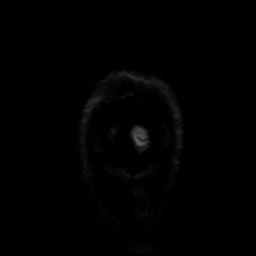

[Series 8: (person_name) · axial · 3.0mm · 0.47mm/px · 1 of 100 slices shown]
[im 1/100]
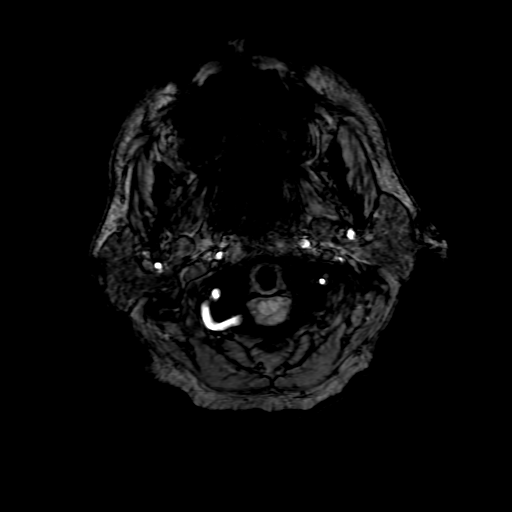

[Series 10: T2 · coronal · 5.0mm · 0.39mm/px · 3 of 27 slices shown (2 of 2)]
[im 1/27]
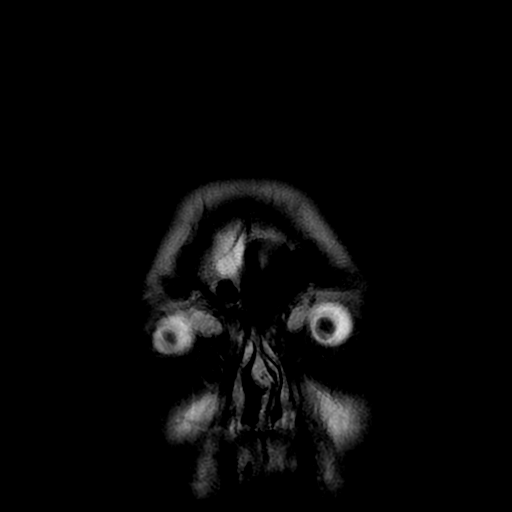
[im 14/27]
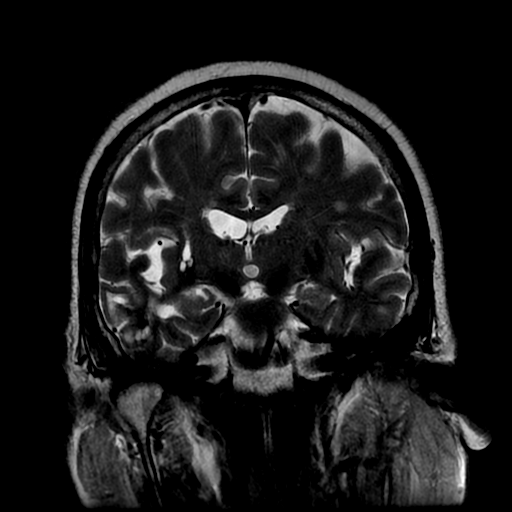
[im 27/27]
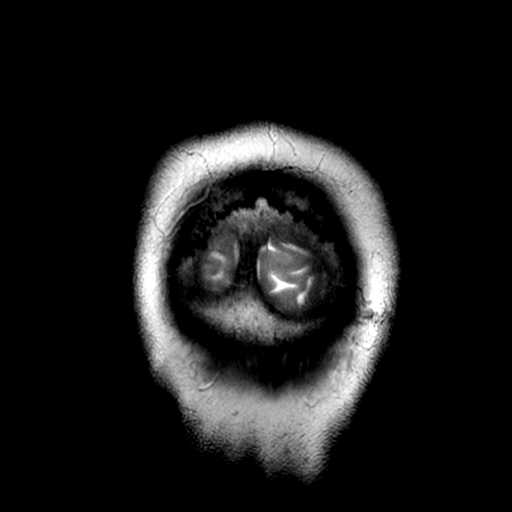

[Series 400: DWI · axial · 3.6mm · 0.94mm/px · z∈[-104,+33]mm · 4 of 41 slices shown (3 of 4)]
[im 1/41]
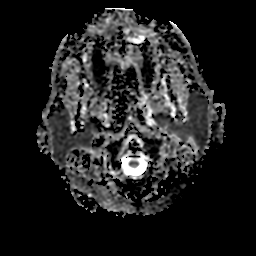
[im 14/41]
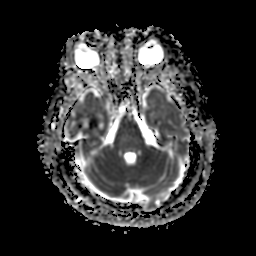
[im 27/41]
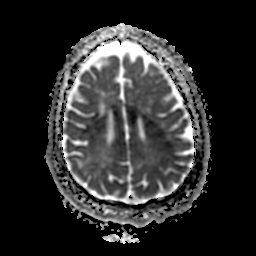
[im 41/41]
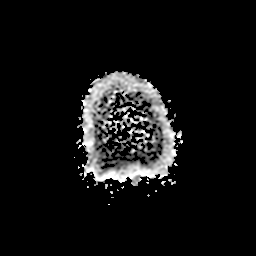

[Series 700: DWI · coronal · 5.0mm · 0.94mm/px · 3 of 34 slices shown (4 of 4)]
[im 1/34]
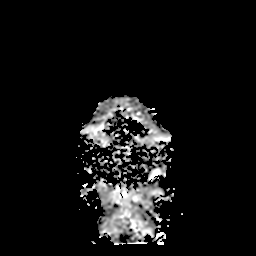
[im 17/34]
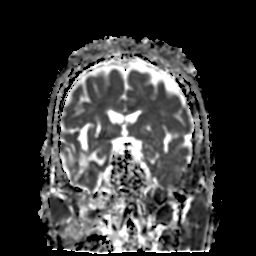
[im 34/34]
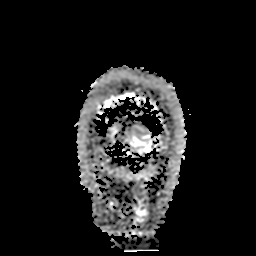

[34 of 48 positions shown; findings below may reference images not displayed]

FINDINGS: Diffuse prominence of the CSF containing spaces is compatible with
generalized age-related cerebral atrophy. Patchy T2/FLAIR
hyperintensity throughout the periventricular and deep white matter
both cerebral hemispheres most consistent with chronic small vessel
ischemic disease, moderate nature. Small vessel type changes present
within the pons. Multiple remote lacunar infarcts involve the
bilateral basal ganglia, slightly worse on the right.
Encephalomalacia within the right temporal occipital region related
to prior hemorrhage noted. There is chronic hemosiderin staining
within this region.

No abnormal foci of restricted diffusion to suggest acute
intracranial infarct. Gray-white matter differentiation maintained.
Major intracranial vascular flow voids are preserved. No acute
intracranial hemorrhage.

No mass lesion, midline shift, or mass effect. No hydrocephalus.
Mild ex vacuo dilatation of the right lateral ventricle related to
the right temporal occipital encephalomalacia.

Craniocervical junction normal. No significant degenerative changes
within the visualized upper cervical spine.

Pituitary gland within normal limits. No acute abnormality about the
orbits.

Mild mucosal thickening within the ethmoidal air cells. Paranasal
sinuses are otherwise clear. No mastoid effusion. Inner ear
structures normal.

Bone marrow signal intensity within normal limits. No scalp soft
tissue abnormality.
IMPRESSION: 1. No acute intracranial infarct or other abnormality identified.
2. Left temporal occipital encephalomalacia, consistent with prior
hemorrhage seen in this region.
3. Moderate cerebral atrophy with chronic small vessel ischemic
disease and multiple remote lacunar infarcts involving the bilateral
basal ganglia.

## 2017-10-26 ENCOUNTER — Emergency Department (HOSPITAL_COMMUNITY)
Admission: EM | Admit: 2017-10-26 | Discharge: 2017-10-26 | Disposition: A | Discharge status: Home / Self Care | Payer: Medicare Other | Attending: Emergency Medicine | Admitting: Emergency Medicine

## 2017-10-26 ENCOUNTER — Encounter (HOSPITAL_COMMUNITY): Payer: Self-pay | Admitting: Emergency Medicine

## 2017-10-26 ENCOUNTER — Emergency Department (HOSPITAL_COMMUNITY): Payer: Medicare Other

## 2017-10-26 DIAGNOSIS — Y9389 Activity, other specified: Secondary | ICD-10-CM | POA: Insufficient documentation

## 2017-10-26 DIAGNOSIS — Z87891 Personal history of nicotine dependence: Secondary | ICD-10-CM | POA: Insufficient documentation

## 2017-10-26 DIAGNOSIS — Y999 Unspecified external cause status: Secondary | ICD-10-CM | POA: Diagnosis not present

## 2017-10-26 DIAGNOSIS — M545 Low back pain: Secondary | ICD-10-CM | POA: Diagnosis not present

## 2017-10-26 DIAGNOSIS — Y929 Unspecified place or not applicable: Secondary | ICD-10-CM | POA: Diagnosis not present

## 2017-10-26 DIAGNOSIS — X500XXA Overexertion from strenuous movement or load, initial encounter: Secondary | ICD-10-CM | POA: Insufficient documentation

## 2017-10-26 DIAGNOSIS — Z79899 Other long term (current) drug therapy: Secondary | ICD-10-CM | POA: Diagnosis not present

## 2017-10-26 DIAGNOSIS — J449 Chronic obstructive pulmonary disease, unspecified: Secondary | ICD-10-CM | POA: Diagnosis not present

## 2017-10-26 DIAGNOSIS — S39012A Strain of muscle, fascia and tendon of lower back, initial encounter: Secondary | ICD-10-CM

## 2017-10-26 DIAGNOSIS — I10 Essential (primary) hypertension: Secondary | ICD-10-CM | POA: Insufficient documentation

## 2017-10-26 DIAGNOSIS — S3992XA Unspecified injury of lower back, initial encounter: Secondary | ICD-10-CM | POA: Diagnosis present

## 2017-10-26 MED ORDER — ACETAMINOPHEN 325 MG PO TABS: 650.0000 mg | ORAL_TABLET | Freq: Four times a day (QID) | ORAL | 0 refills | Status: DC | PRN

## 2017-10-26 MED ORDER — IBUPROFEN 600 MG PO TABS: 600.0000 mg | ORAL_TABLET | Freq: Four times a day (QID) | ORAL | 0 refills | Status: DC | PRN

## 2017-10-26 NOTE — Discharge Instructions (Signed)
You may alternate taking Tylenol and Ibuprofen as needed for pain control. You may take 400-600 mg of ibuprofen every 6 hours and [phone] mg of Tylenol every 6 hours. Do not exceed 4000 mg of Tylenol daily as this can lead to liver damage. Also, make sure to take Ibuprofen with meals as it can cause an upset stomach. Do not take other NSAIDs while taking Ibuprofen such as (Aleve, Naprosyn, Aspirin, Celebrex, etc) and do not take more than the prescribed dose as this can lead to ulcers and bleeding in your GI tract. You may use warm and cold compresses to help with your symptoms.   Please follow up with your primary doctor within the next 3-5 days for re-evaluation and further treatment of your symptoms.   Return to the emergency department immediately if you experience any back pain associated with fevers, loss of control of your bowels/bladder, weakness/numbness to your legs, numbness to your groin area, inability to walk, or inability to urinate.

## 2017-10-26 NOTE — ED Triage Notes (Signed)
Pt here from home with lower left back pain , no pain over the spine pain over the muscle upon palpation

## 2017-10-26 NOTE — ED Provider Notes (Signed)
[facility] EMERGENCY DEPARTMENT Provider Note   CSN: [id number] Arrival date & time: 10/26/17  1058     History   Chief Complaint Chief Complaint  Patient presents with  . Back Pain    HPI Eric [name] is a 77 y.o. male.  HPI   Patient is a 77 year old male who presented to the ED today complaining of left lower back pain that began about 1 week ago.  States he does not have any pain at rest, but does have pain when he moves or when he tries to walk.  Pain is nonradiating.  He rates it as a moderate pain when walking/moving.  He denies any falls or injuries.  States that he lifted up a vacuum last week, but nothing heavier than that.  States he has been able to ambulate at home however it is painful.  He walks with a cane at home.  Pt denies any numbness/tingling/weakness to the BLE. Denies saddle anesthesia. Denies loss of control of bowels or bladder. No urinary retention. No fevers. Denies a h/o IVDU. Denies a h/o CA or recent unintended weight loss.  Past Medical History:  Diagnosis Date  . Benign prostatic hypertrophy   . COPD (chronic obstructive pulmonary disease) ([place])    in past  . Difficulty reading    and writing  . ED (erectile dysfunction)   . 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 Hypertension   . Stroke [facility]) 06/2014    Patient Active Problem List   Diagnosis Date Noted  . Well adult exam 07/30/2017  . Vitamin D deficiency 12/18/2016  . Osteoarthritis 12/18/2016  . B12 deficiency 09/16/2016  . Hypokalemia 08/16/2016  . Dizziness and giddiness 11/15/2015  . Blurred vision 11/15/2015  . Headache 07/07/2013  . Hyperlipemia 07/07/2013  . ICH (intracerebral hemorrhage) ([place]) 07/03/2013  . Pain of both eyes 07/02/2013  . Acute bronchitis 07/02/2013  . 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 Essential hypertension 04/10/2007  . COPD 04/10/2007  . BPH associated with nocturia 04/10/2007    Past Surgical History:    Procedure Laterality Date  . bilateral breast removal    . COLONOSCOPY  04/24/05       Home Medications    Prior to Admission medications   Medication Sig Start Date End Date Taking? Authorizing Provider  acetaminophen (TYLENOL) 325 MG tablet Take 2 tablets (650 mg total) by mouth every 6 (six) hours as needed. 10/26/17   Shamarion Coots S, PA-C  amLODipine-benazepril (LOTREL) 5-20 MG capsule TAKE 1 CAPSULE BY MOUTH DAILY 08/07/17   Plotnikov, Evie Lacks, MD  aspirin 81 MG EC tablet TAKE 1 TABLET BY MOUTH EVERY DAY 11/27/16   Garvin Fila, MD  Cholecalciferol (VITAMIN D3) 2000 units capsule Take 1 capsule (2,000 Units total) by mouth daily. 08/16/16   Plotnikov, Evie Lacks, MD  Cyanocobalamin (VITAMIN B-12) 1000 MCG SUBL Place 1 tablet (1,000 mcg total) under the tongue daily. 03/25/17   Plotnikov, Evie Lacks, MD  gabapentin (NEURONTIN) 300 MG capsule 1 three times daily and 2 at bedtime 08/16/16   Plotnikov, Evie Lacks, MD  hydrochlorothiazide (HYDRODIURIL) 12.5 MG tablet TAKE 1 TABLET BY MOUTH EVERY DAY 10/07/16   Dorena Cookey, MD  ibuprofen (ADVIL,MOTRIN) 600 MG tablet Take 1 tablet (600 mg total) by mouth every 6 (six) hours as needed. 10/26/17   English Tomer S, PA-C  traMADol (ULTRAM) 50 MG tablet Take 1 tablet (50 mg total) by mouth 3 (three) times  daily as needed for severe pain. 07/30/17   Plotnikov, Evie Lacks, MD    Family History Family History  Problem Relation Age of Onset  . Arthritis Mother   . Diabetes Father   . Diabetes Other   . Hypertension Other     Social History Social History   Tobacco Use  . Smoking status: Former Smoker    Last attempt to quit: 09/22/1999    Years since quitting: 18.1  . Smokeless tobacco: Never Used  Substance Use Topics  . Alcohol use: No    Alcohol/week: 0.0 oz  . Drug use: No     Allergies   Hctz [hydrochlorothiazide]   Review of Systems Review of Systems  Constitutional: Negative for fever.  HENT: Negative for  congestion.   Respiratory: Negative for shortness of breath.   Cardiovascular: Negative for chest pain.  Gastrointestinal: Negative for abdominal pain, constipation, diarrhea, nausea and vomiting.  Genitourinary: Negative for decreased urine volume, difficulty urinating, dysuria, flank pain and hematuria.  Musculoskeletal: Positive for back pain.  Skin: Negative for rash.  Neurological: Negative for weakness and numbness.     Physical Exam Updated Vital Signs BP (!) 149/70 (BP Location: Left Arm)   Pulse 82   Temp 98.3 F (36.8 C) (Oral)   Resp 18   Ht 5\' 8"  (1.727 m)   Wt 70.8 kg (156 lb)   SpO2 95%   BMI 23.72 kg/m   Physical Exam  Constitutional: He appears well-developed and well-nourished.  Nontoxic, no acute distress  HENT:  Head: Normocephalic and atraumatic.  Eyes: Conjunctivae are normal.  Neck: Normal range of motion. Neck supple.  Cardiovascular: Normal rate.  No murmur heard. Pulmonary/Chest: Effort normal. No respiratory distress.  Abdominal: Soft. He exhibits no distension. There is no tenderness. There is no guarding.  No CVA tenderness bilaterally.  Musculoskeletal: He exhibits no edema.  5/5 strength to bilateral upper and lower extremities.  Sensation intact to bilateral lower extremities.  Taylor DTRs 2+ bilaterally.  Patient able to ambulate with cane, however it is painful.  He has no midline tenderness to the cervical, thoracic, or lumbar spine.  He does have some tenderness to palpation of the left paraspinous muscles just adjacent to L3-L4 area.  No overlying ecchymosis or erythema.  Neurological: He is alert.  Skin: Skin is warm and dry.  Psychiatric: He has a normal mood and affect.  Nursing note and vitals reviewed.    ED Treatments / Results  Labs (all labs ordered are listed, but only abnormal results are displayed) Labs Reviewed - No data to display  EKG  EKG Interpretation None       Radiology Dg Lumbar Spine Complete  Result  Date: 10/26/2017 CLINICAL DATA:  Low back pain.  No reported injury. EXAM: LUMBAR SPINE - COMPLETE 4+ VIEW COMPARISON:  None. FINDINGS: This report assumes 5 non rib-bearing lumbar vertebrae. Mild dextrocurvature of the lumbar spine. Lumbar vertebral body heights are preserved, with no fracture. Marked multilevel lumbar degenerative disc disease, most prominent at L2-3, L4-5 and L5-S1. Mild 3 mm retrolisthesis at L2-3 and L3-4. Bilateral lower lumbar facet arthropathy. No aggressive appearing focal osseous lesions. Abdominal aortic atherosclerosis. 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 Mild multilevel degenerative upper lumbar spondylolisthesis. Electronically Signed   By: Ilona Sorrel M.D.   On: 10/26/2017 12:00    Procedures Procedures (including critical care time)  Medications Ordered in ED Medications - No data to display   Initial Impression /  Assessment and Plan / ED Course  I have reviewed the triage vital signs and the nursing notes.  Pertinent labs & imaging results that were available during my care of the patient were reviewed by me and considered in my medical decision making (see chart for details).    Discussed pt presentation and exam findings with Dr. Ashok Cordia, who evaluated the patient and agrees with the plan to discharge the patient with anti-inflammatories and follow-up with PCP.   Final Clinical Impressions(s) / ED Diagnoses   Final diagnoses:  Strain of lumbar region, initial encounter   Patient with back pain.  No neurological deficits and normal neuro exam.  Patient can walk but states is painful. No red flag signs or symptoms suggesting cauda equina, myelopathy, discitis, or spinal epidural abscess including no urinary or bowel incontinence, no urinary retention, no weakness/numbness to BLE, no saddle anesthesia, no fevers or weight loss, and no h/o CA or IVDU. RICE protocol and pain medicine indicated and  discussed with patient.   ED Discharge Orders        Ordered    acetaminophen (TYLENOL) 325 MG tablet  Every 6 hours PRN     10/26/17 1311    ibuprofen (ADVIL,MOTRIN) 600 MG tablet  Every 6 hours PRN     10/26/17 1311       Dewarren Ledbetter S, PA-C 10/26/17 1316    Lajean Saver, MD 10/26/17 1328

## 2017-10-27 ENCOUNTER — Other Ambulatory Visit: Payer: Self-pay

## 2017-12-30 NOTE — Progress Notes (Signed)
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  UPDATE 06/13/14 (LL): He returns for stroke follow up.  He states his headaches have persisted, but Tramadol does help if he takes 2. He feels his vision is much improved and he has adapted to his peripheral vision loss. Left arm pain persists and is most bothersome at night. Gabapentin does help but he does not often take this during the day. He did not find any benefit from Topamax for headaches and stopped it on his own.  He does not drive anymore. His daughter is concerned that he has to clear his throat often, but he states that he does not get choked on foods or drinks. He states his blood pressure is well controlled although it is elevated in the office today, 151/85. He has no new complaints.  UPDATE 12/10/13 (LL): Eric [name] returns for stroke follow up. He states he feels good and his headaches are getting better. He still has left peripheral vision deficit. His speech is slurred only when he is very tired. His strength has returned but complains of left arm pain at times, mostly at night. His BP is well controlled, it is 113/67 in the office today.  On 07/02/13, he presented to ER with sudden onset of slurred speech, left facial weakness and left hemiparesthesias as well as right frontotemporal headache. CT scan of the head showed a 4 cm right temporoparietal parenchymal hemorrhage with surrounding edema and mass effect. NIH stroke scale was 14. He was admitted to the intensive care unit and blood pressure tightly controlled. Admission blood pressure was elevated at 184/100. Followed imaging study showed stable appearance of the hemorrhage. MRI scan the brain confirmed the  parenchyma right temporoparietal hemorrhage with volume of 28 cubic cc. MRI of the brain showed no large vessel occlusion. Transthoracic echo showed normal ejection fraction. Carotid ultrasound showed no stenosis. EKG showed regular sinus rhythm. Urine drug screen was negative. Vascular risk factors identified included mild hyperlipidemia with LDL of 104. Patient had persistent headache and was treated with Demerol. He had left homonymous and a hemianopsia and mild left-sided weakness and sensory loss. Clinical status improved he was discharged home. He states his done well his speech and left-sided weakness and numbness is a lot better. Headaches improved but are still persistent particularly at night he has right frontal and periorbital headache which usually responds to come about but at times it is quite disabling yet. He has occasional slurred speech off and on. She states his blood pressure has been quite well controlled at home. He has an appointment with his family physician Dr. Tawanna Cooler next month. He admits to mild short-term memory difficulties but these are not bothersome. He had followup CT scan of the head done on 07/23/13 which showed expectoration or he change in the hemorrhage with mild persistent edema and mass effect.  Update 06/15/2015 : He returns for follow-up after last visit 6 months ago. He states improvement in his headaches with Topamax and tramadol. Is tolerating Topamax 50 mg twice daily without side effects and takes tramadol as needed. His headache frequency is now once or twice a week and then not as disabling. He also takes gabapentin which helps his arm pain. States his  blood pressure is usually well controlled though it is slightly elevated in office today. Patient continues to have some mild vision difficulties from his remote brain hemorrhage now more than 2 years ago. He has no new complaints. Update 11/15/2015 : Patient is seen urgently today following a recent trip to the  emergency room on 11/09/15. He is accompanied by his daughter. Patient states that he developed sudden onset of dizziness, blurred vision, staggering gait as well as right arm pain as well as chest pain. He was seen in the emergency room where an urgent MRI scan of the brain was obtained which  I personally reviewedshowed no acute abnormality. Patient's basic lab work was unremarkable except for slightly low fashion of 3.1. Her personally reviewed the films and physician notes. Patient's is unable to pinpoint as to why he had the symptoms but they recovered within a day or so. He does occasionally get some numbness in his right hand as well as some vision difficulties as well as Dr. fields at times his speech is difficult to understand and slurred. He is not had any witnessed seizure activities, significant fall, loss of consciousness. Patient has discontinued Topamax as apparently didn't help his headaches. He is also not taking gabapentin regularly. He does take 2-3 tablets of tramadol every day and states that his headaches are doing very well. He still has some vision difficulties following his deficits from remote intracerebral hemorrhage more than 2 years ago. He states his blood pressure is well controlled and today it is 128/79. Update 7/5/2017PS : He returns for follow-up after last visit 3 months ago. Is accompanied by his wife. He states that he still gets headaches off and on. He is tolerating Topamax 50 mg twice daily as well as gabapentin 303 times daily. Headache frequency may have "gone down but his headache still pretty severe. Tramadol seems to help but he ran out of his prescription. Still has some paresthesias in his hands as well. He had follow-up carotid ultrasound done on 11/29/15 showed no significant extracranial stenosis. EEG done on 12/04/15 was normal. He has not had any recurrent stroke or TIA symptoms. He remains on aspirin which is tolerating well without bruising or bleeding. States his  blood pressure is well controlled and today it is 137/76. He states his sugars also better controlled his cannot and given the past and intermittent A1c checked. He wants a refill of the tramadol UPDATE 10/26/2017CM Eric [name], 77 year old male returns for follow-up. His headaches are much better. His daughter has now started monitoring his medications because he was taking his. Tramadol thinking it was his blood pressure medication. He is only to use that when necessary no more than twice a week. He is also on Topamax 50 mg 1 in the morning and 2 at night and she takes Neurontin 300mg  3 times daily. He is on aspirin for secondary stroke prevention and has not had further stroke or TIA symptoms. He has not had any bruising or bleeding. Blood pressure is well controlled in the office today 134/82 he returns for reevaluation  UPDATE 04/26/2018CM Eric [name], 77 year old male returns for follow-up with his daughter. He reports that his headaches are much better he has stopped his Topamax. He remains on Neurontin. He remains on aspirin for secondary stroke prevention and has not had further stroke or TIA symptoms. He has no bruising and no bleeding. Blood pressure in the office today 146/84. He takes vitamin B12 supplement. He walks for exercise. He denies any  recent falls. He returns for reevaluation . His labs are followed by his primary care. UPDATE 5/1/2019CM Eric [name] 77 year old male returns for follow-up with his daughter,.  His headaches are much better and he has stopped Topamax.  He continues to complain of left arm pain and has stopped his gabapentin since last seen.  He remains on aspirin for secondary stroke prevention without further stroke or TIA symptoms.  He has no bruising and no bleeding blood pressure in the office today 148/83 no new neurologic symptoms he returns for reevaluation   REVIEW OF SYSTEMS: Full 14 system review of systems performed and notable only for those listed, all  others are neg:  Constitutional: Fatigue Cardiovascular: neg Ear/Nose/Throat: neg  Skin: neg Eyes: Light sensitivity, blurred vision Respiratory: neg Gastroitestinal: neg  Hematology/Lymphatic: neg  Endocrine: Intolerance to heat and cold Musculoskeletal:  arm pain Allergy/Immunology: neg Neurological: Headache, history of stroke Psychiatric: neg Sleep : neg   ALLERGIES: Allergies  Allergen Reactions  . Hctz [Hydrochlorothiazide]     Peeing too much    HOME MEDICATIONS: Outpatient Medications Prior to Visit  Medication Sig Dispense Refill  . acetaminophen (TYLENOL) 325 MG tablet Take 2 tablets (650 mg total) by mouth every 6 (six) hours as needed. 30 tablet 0  . amLODipine-benazepril (LOTREL) 5-20 MG capsule TAKE 1 CAPSULE BY MOUTH DAILY 90 capsule 3  . aspirin 81 MG EC tablet TAKE 1 TABLET BY MOUTH EVERY DAY 90 tablet 1  . Cholecalciferol (VITAMIN D3) 2000 units capsule Take 1 capsule (2,000 Units total) by mouth daily. 100 capsule 3  . Cyanocobalamin (VITAMIN B-12) 1000 MCG SUBL Place 1 tablet (1,000 mcg total) under the tongue daily. 100 tablet 3  . hydrochlorothiazide (HYDRODIURIL) 12.5 MG tablet TAKE 1 TABLET BY MOUTH EVERY DAY 100 tablet 4  . ibuprofen (ADVIL,MOTRIN) 600 MG tablet Take 1 tablet (600 mg total) by mouth every 6 (six) hours as needed. 30 tablet 0  . traMADol (ULTRAM) 50 MG tablet Take 1 tablet (50 mg total) by mouth 3 (three) times daily as needed for severe pain. 60 tablet 3  . gabapentin (NEURONTIN) 300 MG capsule 1 three times daily and 2 at bedtime 150 capsule 6   No facility-administered medications prior to visit.     PAST MEDICAL HISTORY: Past Medical History:  Diagnosis Date  . Benign prostatic hypertrophy   . COPD (chronic obstructive pulmonary disease) (HCC)    in past  . Difficulty reading    and writing  . ED (erectile dysfunction)   . 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 Hypertension   . Stroke [facility] 06/2014    PAST  SURGICAL HISTORY: Past Surgical History:  Procedure Laterality Date  . bilateral breast removal    . COLONOSCOPY  04/24/05    FAMILY HISTORY: Family History  Problem Relation Age of Onset  . Arthritis Mother   . Diabetes Father   . Diabetes Other   . Hypertension Other     SOCIAL HISTORY: Social History   Socioeconomic History  . Marital status: Married    Spouse name: Nellie  . Number of children: 6  . Years of education: 10th  . Highest education level: Not on file  Occupational History  . Occupation: retired  Engineer, production  . Financial resource strain: Not on file  . Food insecurity:    Worry: Not on file    Inability: Not on file  . Transportation needs:    Medical: Not on file  Non-medical: Not on file  Tobacco Use  . Smoking status: Former Smoker    Last attempt to quit: 09/22/1999    Years since quitting: 18.2  . Smokeless tobacco: Never Used  Substance and Sexual Activity  . Alcohol use: No    Alcohol/week: 0.0 oz  . Drug use: No  . Sexual activity: Yes  Lifestyle  . Physical activity:    Days per week: Not on file    Minutes per session: Not on file  . Stress: Not on file  Relationships  . Social connections:    Talks on phone: Not on file    Gets together: Not on file    Attends religious service: Not on file    Active member of club or organization: Not on file    Attends meetings of clubs or organizations: Not on file    Relationship status: Not on file  . Intimate partner violence:    Fear of current or ex partner: Not on file    Emotionally abused: Not on file    Physically abused: Not on file    Forced sexual activity: Not on file  Other Topics Concern  . Not on file  Social History Narrative   Patient lives at home with wife, consumes no caffeine           PHYSICAL EXAM  Vitals:   12/31/17 1526  BP: (!) 148/83  Pulse: 71  Weight: 176 lb 9.6 oz (80.1 kg)  Height: 5\' 8"  (1.727 m)   Body mass index is 26.85 kg/m.    Generalized: Well developed, in no acute distress  Head: normocephalic and atraumatic,. Oropharynx benign  Neck: Supple, no carotid bruits  Cardiac: Regular rate rhythm, no murmur  Musculoskeletal: No deformity   Neurological examination   Mentation: Alert oriented to time, place, history taking. Attention span and concentration appropriate. Recent and remote memory intact.  Follows all commands speech and language fluent.  Cranial nerve II-XII: .Pupils were equal round reactive to light extraocular movements were full, Visual fields showed partial left homonymous hemianopsia to confrontation. Facial sensation and strength were normal. hearing was intact to finger rubbing bilaterally. Uvula tongue midline. head turning and shoulder shrug were normal and symmetric.Tongue protrusion into cheek strength was normal. Motor: normal bulk and tone, full strength in the BUE, BLE, except mild increased tone in the left leg with stiffness Sensory: normal and symmetric to light touch, pinprick, and  Vibration, in the upper and lower extremities, mildly decreased to modalities on the left Coordination: finger-nose-finger, heel-to-shin bilaterally, no dysmetria Reflexes: 1+ upper lower and symmetric, plantar responses were flexor bilaterally. Gait and Station: Rising up from seated position without assistance, normal stance,  moderate stride, good arm swing, smooth turning, able to perform tiptoe, and heel walking without difficulty. Tandem gait is steady.  No assistive device DIAGNOSTIC DATA (LABS, IMAGING, TESTING) - I reviewed patient records, labs, notes, testing and imaging myself where available.  Lab Results  Component Value Date   WBC 8.4 07/30/2017   HGB 11.9 (L) 07/30/2017   HCT 37.1 (L) 07/30/2017   MCV 80.6 07/30/2017   PLT 365.0 07/30/2017      Component Value Date/Time   NA 139 07/30/2017 1610   K 4.1 07/30/2017 1610   CL 104 07/30/2017 1610   CO2 29 07/30/2017 1610   GLUCOSE 103  (H) 07/30/2017 1610   BUN 8 07/30/2017 1610   CREATININE 0.99 07/30/2017 1610   CALCIUM 9.8 07/30/2017 1610   PROT  7.5 07/30/2017 1610   ALBUMIN 4.1 07/30/2017 1610   AST 16 07/30/2017 1610   ALT 11 07/30/2017 1610   ALKPHOS 78 07/30/2017 1610   BILITOT 0.4 07/30/2017 1610   GFRNONAA >60 11/08/2015 2151   GFRAA >60 11/08/2015 2151    ASSESSMENT AND PLAN 59 year african [place] male with a 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 Clinical doing well with resolution of right frontal headaches, continued left hemianopsia.   Tramadol has been effective for symptomatic relief of  arm pain . Gabapentin has also been beneficial  in the past but patient stopped the medication. The patient is a current patient of Dr. Pearlean Brownie who is out of the office today . This note is sent to the work in doctor.      PLAN: Continue aspirin 81 mg daily  for secondary stroke prevention maintain strict control of hypertension with blood pressure goal below 130/90, today's reading 148/83   lipids with LDL cholesterol goal below 70 mg/dL. Followed by PCP Restart Neurontin 300 mg  2 times daily for 2 weeks then 3 times daily take last dose at bedtime I also advised the patient to eat a healthy diet with plenty of whole grains, cereals, fruits Continue   tramadol  Prescribed by Dr. Posey Rea Follow-up in  1  year I spent 25 min  in total face to face time with the patient and daughter more than 50% of which was spent counseling and coordination of care, reviewing test results reviewing medications and discussing and reviewing the diagnosis of stroke and monitoring risk factors as well as control of headaches and arm pain. , Cline Crock, [facility], APRN  [facility] [address], [address] [place], [place] [address] [phone]

## 2017-12-31 ENCOUNTER — Encounter: Payer: Self-pay | Admitting: Nurse Practitioner

## 2017-12-31 ENCOUNTER — Ambulatory Visit: Payer: Medicare Other | Admitting: Nurse Practitioner

## 2017-12-31 VITALS — BP 148/83 | HR 71 | Ht 68.0 in | Wt 176.6 lb

## 2017-12-31 DIAGNOSIS — M79602 Pain in left arm: Secondary | ICD-10-CM | POA: Diagnosis not present

## 2017-12-31 DIAGNOSIS — I10 Essential (primary) hypertension: Secondary | ICD-10-CM

## 2017-12-31 DIAGNOSIS — I611 Nontraumatic intracerebral hemorrhage in hemisphere, cortical: Secondary | ICD-10-CM

## 2017-12-31 MED ORDER — GABAPENTIN 300 MG PO CAPS: ORAL_CAPSULE | ORAL | 6 refills | Status: DC

## 2017-12-31 NOTE — Patient Instructions (Signed)
Continue aspirin 81 mg daily  for secondary stroke prevention maintain strict control of hypertension with blood pressure goal below 130/90, today's reading 148/83   lipids with LDL cholesterol goal below 70 mg/dL. Followed by PCP Restart Neurontin 300 mg  2 times daily for 2 weeks then 3 times daily take last dose at bedtime I also advised the patient to eat a healthy diet with plenty of whole grains, cereals, fruits Continue   tramadol  Prescribed by Dr. Alain Marion Follow-up in  1  year

## 2017-12-31 NOTE — Progress Notes (Signed)
I have read the note, and I agree with the clinical assessment and plan.  Zubin Pontillo K Kal Chait   

## 2018-01-23 ENCOUNTER — Other Ambulatory Visit: Payer: Self-pay | Admitting: Nurse Practitioner

## 2018-01-28 ENCOUNTER — Other Ambulatory Visit: Payer: Self-pay | Admitting: Internal Medicine

## 2018-01-28 ENCOUNTER — Ambulatory Visit: Payer: Self-pay | Admitting: Internal Medicine

## 2018-01-28 NOTE — Telephone Encounter (Signed)
Copied from [place] [id number]. Topic: Quick Communication - Rx Refill/Question >> Jan 28, 2018  3:20 PM Arletha Grippe wrote: Medication: traMADol (ULTRAM) 50 MG tablet  Has the patient contacted their pharmacy? No. (Agent: If no, request that the patient contact the pharmacy for the refill.) (Agent: If yes, when and what did the pharmacy advise?)  Preferred Pharmacy (with phone number or street name): [facility] [address]    Agent: Please be advised that RX refills may take up to 3 business days. We ask that you follow-up with your pharmacy.

## 2018-01-29 MED ORDER — TRAMADOL HCL 50 MG PO TABS: 50.0000 mg | ORAL_TABLET | Freq: Three times a day (TID) | ORAL | 0 refills | Status: DC | PRN

## 2018-01-29 NOTE — Telephone Encounter (Signed)
LOV  07/30/17 Dr. Alain Marion Last refill  07/30/17  # 60 with  3 refills

## 2018-01-29 NOTE — Telephone Encounter (Signed)
Check [place] registry last filled 10/24/2017. MD is out of the office this week pls advise on refill.Marland KitchenJohny Chess

## 2018-02-20 ENCOUNTER — Telehealth: Payer: Self-pay | Admitting: Internal Medicine

## 2018-02-20 ENCOUNTER — Encounter: Payer: Self-pay | Admitting: Internal Medicine

## 2018-02-20 ENCOUNTER — Other Ambulatory Visit (INDEPENDENT_AMBULATORY_CARE_PROVIDER_SITE_OTHER): Payer: Medicare Other

## 2018-02-20 ENCOUNTER — Other Ambulatory Visit: Payer: Self-pay | Admitting: Internal Medicine

## 2018-02-20 ENCOUNTER — Ambulatory Visit (INDEPENDENT_AMBULATORY_CARE_PROVIDER_SITE_OTHER): Payer: Medicare Other | Admitting: Internal Medicine

## 2018-02-20 VITALS — BP 130/82 | HR 68 | Ht 68.0 in | Wt 176.0 lb

## 2018-02-20 DIAGNOSIS — I10 Essential (primary) hypertension: Secondary | ICD-10-CM

## 2018-02-20 DIAGNOSIS — M79602 Pain in left arm: Secondary | ICD-10-CM

## 2018-02-20 DIAGNOSIS — E538 Deficiency of other specified B group vitamins: Secondary | ICD-10-CM

## 2018-02-20 DIAGNOSIS — N320 Bladder-neck obstruction: Secondary | ICD-10-CM | POA: Diagnosis not present

## 2018-02-20 DIAGNOSIS — I611 Nontraumatic intracerebral hemorrhage in hemisphere, cortical: Secondary | ICD-10-CM

## 2018-02-20 DIAGNOSIS — E876 Hypokalemia: Secondary | ICD-10-CM | POA: Diagnosis not present

## 2018-02-20 DIAGNOSIS — E559 Vitamin D deficiency, unspecified: Secondary | ICD-10-CM | POA: Diagnosis not present

## 2018-02-20 LAB — BASIC METABOLIC PANEL
BUN: 12 mg/dL (ref 6–23)
CO2: 27 mEq/L (ref 19–32)
Calcium: 8.9 mg/dL (ref 8.4–10.5)
Chloride: 105 mEq/L (ref 96–112)
Creatinine, Ser: 1.08 mg/dL (ref 0.40–1.50)
GFR: 85.21 mL/min (ref >60.00)
GLUCOSE: 112 mg/dL — AB (ref 70–99)
POTASSIUM: 3.7 meq/L (ref 3.5–5.1)
Sodium: 140 mEq/L (ref 135–145)

## 2018-02-20 LAB — PSA: PSA: 1.22 ng/mL (ref 0.10–4.00)

## 2018-02-20 NOTE — Assessment & Plan Note (Signed)
On B12 

## 2018-02-20 NOTE — Assessment & Plan Note (Signed)
Off HCTZ 

## 2018-02-20 NOTE — Assessment & Plan Note (Signed)
Vit D 

## 2018-02-20 NOTE — Assessment & Plan Note (Addendum)
Lotrel, HCTZ - off, baby ASA

## 2018-02-20 NOTE — Progress Notes (Signed)
Subjective:  Patient ID: Eric [name], male    DOB: 06-05-1941  Age: 77 y.o. MRN: [id number]  CC: No chief complaint on file.   HPI Eric [name] presents for CVA, Vit B12 def, HTN f/u  Outpatient Medications Prior to Visit  Medication Sig Dispense Refill  . acetaminophen (TYLENOL) 325 MG tablet Take 2 tablets (650 mg total) by mouth every 6 (six) hours as needed. 30 tablet 0  . amLODipine-benazepril (LOTREL) 5-20 MG capsule TAKE 1 CAPSULE BY MOUTH DAILY 90 capsule 3  . aspirin 81 MG EC tablet TAKE 1 TABLET BY MOUTH EVERY DAY 90 tablet 1  . Cholecalciferol (VITAMIN D3) 2000 units capsule Take 1 capsule (2,000 Units total) by mouth daily. 100 capsule 3  . Cyanocobalamin (VITAMIN B-12) 1000 MCG SUBL Place 1 tablet (1,000 mcg total) under the tongue daily. 100 tablet 3  . gabapentin (NEURONTIN) 300 MG capsule TAKE 1 CAP TWO TIMES DAILY FOR 2 WEEKS THEN INCREASE TO 3 TIMES WEEKLY 270 capsule 3  . hydrochlorothiazide (HYDRODIURIL) 12.5 MG tablet TAKE 1 TABLET BY MOUTH EVERY DAY 100 tablet 4  . ibuprofen (ADVIL,MOTRIN) 600 MG tablet Take 1 tablet (600 mg total) by mouth every 6 (six) hours as needed. 30 tablet 0  . traMADol (ULTRAM) 50 MG tablet Take 1 tablet (50 mg total) by mouth 3 (three) times daily as needed for severe pain. 60 tablet 0   No facility-administered medications prior to visit.     ROS: Review of Systems  Constitutional: Negative for appetite change, fatigue and unexpected weight change.  HENT: Negative for congestion, nosebleeds, sneezing, sore throat and trouble swallowing.   Eyes: Negative for itching and visual disturbance.  Respiratory: Negative for cough.   Cardiovascular: Negative for chest pain, palpitations and leg swelling.  Gastrointestinal: Negative for abdominal distention, blood in stool, diarrhea and nausea.  Genitourinary: Negative for frequency and hematuria.  Musculoskeletal: Negative for back pain, gait problem, joint swelling and neck pain.    Skin: Negative for rash.  Neurological: Negative for dizziness, tremors, speech difficulty, weakness and headaches.  Psychiatric/Behavioral: Negative for agitation, dysphoric mood, sleep disturbance and suicidal ideas. The patient is not nervous/anxious.     Objective:  BP 130/82 (BP Location: Left Arm, Patient Position: Sitting, Cuff Size: Normal)   Pulse 68   Ht 5\' 8"  (1.727 m)   Wt 176 lb (79.8 kg)   SpO2 97%   BMI 26.76 kg/m   BP Readings from Last 3 Encounters:  02/20/18 130/82  12/31/17 (!) 148/83  10/26/17 (!) 149/70    Wt Readings from Last 3 Encounters:  02/20/18 176 lb (79.8 kg)  12/31/17 176 lb 9.6 oz (80.1 kg)  10/26/17 156 lb (70.8 kg)    Physical Exam  Constitutional: He is oriented to person, place, and time. He appears well-developed. No distress.  NAD  HENT:  Mouth/Throat: Oropharynx is clear and moist.  Eyes: Pupils are equal, round, and reactive to light. Conjunctivae are normal.  Neck: Normal range of motion. No JVD present. No thyromegaly present.  Cardiovascular: Normal rate, regular rhythm, normal heart sounds and intact distal pulses. Exam reveals no gallop and no friction rub.  No murmur heard. Pulmonary/Chest: Effort normal and breath sounds normal. No respiratory distress. He has no wheezes. He has no rales. He exhibits no tenderness.  Abdominal: Soft. Bowel sounds are normal. He exhibits no distension and no mass. There is no tenderness. There is no rebound and no guarding.  Musculoskeletal: Normal range of  motion. He exhibits no edema or tenderness.  Lymphadenopathy:    He has no cervical adenopathy.  Neurological: He is alert and oriented to person, place, and time. He has normal reflexes. No cranial nerve deficit. He exhibits normal muscle tone. He displays a negative Romberg sign. Coordination and gait normal.  Skin: Skin is warm and dry. No rash noted.  Psychiatric: He has a normal mood and affect. His behavior is normal. Judgment and  thought content normal.    Lab Results  Component Value Date   WBC 8.4 07/30/2017   HGB 11.9 (L) 07/30/2017   HCT 37.1 (L) 07/30/2017   PLT 365.0 07/30/2017   GLUCOSE 103 (H) 07/30/2017   CHOL 173 07/30/2017   TRIG 104.0 07/30/2017   HDL 48.90 07/30/2017   LDLDIRECT 163.3 05/26/2008   LDLCALC 104 (H) 07/30/2017   ALT 11 07/30/2017   AST 16 07/30/2017   NA 139 07/30/2017   K 4.1 07/30/2017   CL 104 07/30/2017   CREATININE 0.99 07/30/2017   BUN 8 07/30/2017   CO2 29 07/30/2017   TSH 1.63 07/30/2017   PSA 0.88 08/15/2015   INR 1.12 07/20/2013    Dg Lumbar Spine Complete  Result Date: 10/26/2017 CLINICAL DATA:  Low back pain.  No reported injury. EXAM: LUMBAR SPINE - COMPLETE 4+ VIEW COMPARISON:  None. FINDINGS: This report assumes 5 non rib-bearing lumbar vertebrae. Mild dextrocurvature of the lumbar spine. Lumbar vertebral body heights are preserved, with no fracture. Marked multilevel lumbar degenerative disc disease, most prominent at L2-3, L4-5 and L5-S1. Mild 3 mm retrolisthesis at L2-3 and L3-4. Bilateral lower lumbar facet arthropathy. No aggressive appearing focal osseous lesions. Abdominal aortic atherosclerosis. 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 Mild multilevel degenerative upper lumbar spondylolisthesis. Electronically Signed   By: Delbert Phenix M.D.   On: 10/26/2017 12:00    Assessment & Plan:   There are no diagnoses linked to this encounter.   No orders of the defined types were placed in this encounter.    Follow-up: No follow-ups on file.  Sonda Primes, MD

## 2018-02-20 NOTE — Telephone Encounter (Signed)
Pt called to see if he can get the Tramadol called in for him, contact to advise

## 2018-02-20 NOTE — Telephone Encounter (Signed)
Patient was just seen.  States he needs [facility]ol [facility] to [facility] on [address].

## 2018-02-20 NOTE — Assessment & Plan Note (Addendum)
Tramadol prn ° Potential benefits of a long term opioids use as well as potential risks (i.e. addiction risk, apnea etc) and complications (i.e. Somnolence, constipation and others) were explained to the patient and were aknowledged. ° ° °

## 2018-02-20 NOTE — Assessment & Plan Note (Addendum)
Lotrel

## 2018-02-22 MED ORDER — TRAMADOL HCL 50 MG PO TABS: 50.0000 mg | ORAL_TABLET | Freq: Three times a day (TID) | ORAL | 3 refills | Status: DC | PRN

## 2018-02-22 NOTE — Telephone Encounter (Signed)
Done - thank you.

## 2018-05-15 ENCOUNTER — Encounter: Payer: Self-pay | Admitting: Internal Medicine

## 2018-05-15 ENCOUNTER — Encounter

## 2018-05-15 ENCOUNTER — Ambulatory Visit (INDEPENDENT_AMBULATORY_CARE_PROVIDER_SITE_OTHER): Payer: Medicare Other | Admitting: Internal Medicine

## 2018-05-15 DIAGNOSIS — E538 Deficiency of other specified B group vitamins: Secondary | ICD-10-CM

## 2018-05-15 DIAGNOSIS — M542 Cervicalgia: Secondary | ICD-10-CM

## 2018-05-15 DIAGNOSIS — I10 Essential (primary) hypertension: Secondary | ICD-10-CM | POA: Diagnosis not present

## 2018-05-15 DIAGNOSIS — E559 Vitamin D deficiency, unspecified: Secondary | ICD-10-CM

## 2018-05-15 DIAGNOSIS — R140 Abdominal distension (gaseous): Secondary | ICD-10-CM | POA: Insufficient documentation

## 2018-05-15 MED ORDER — FINASTERIDE 5 MG PO TABS: 5.0000 mg | ORAL_TABLET | Freq: Every day | ORAL | 3 refills | Status: DC

## 2018-05-15 NOTE — Assessment & Plan Note (Addendum)
MSK Tramadol prn Heat, massage, rice sock heating pad ROM exercises

## 2018-05-15 NOTE — Assessment & Plan Note (Signed)
On B12 

## 2018-05-15 NOTE — Assessment & Plan Note (Signed)
On Vit D 

## 2018-05-15 NOTE — Assessment & Plan Note (Signed)
Lotrel po

## 2018-05-15 NOTE — Patient Instructions (Signed)
Stretch exercises for neck  Rice sock heating pad  Activated charcoal tablets or GasEX pills for gas

## 2018-05-15 NOTE — Progress Notes (Signed)
Subjective:  Patient ID: Eric [name], male    DOB: [birth year]  Age: 77 y.o. MRN: [id number]  CC: No chief complaint on file.   HPI Eric [name] presents for neck pain, abd gas x long time off and on, HTN, vitamin deficiencies f/u  Outpatient Medications Prior to Visit  Medication Sig Dispense Refill  . acetaminophen (TYLENOL) 325 MG tablet Take 2 tablets (650 mg total) by mouth every 6 (six) hours as needed. 30 tablet 0  . amLODipine-benazepril (LOTREL) 5-20 MG capsule TAKE 1 CAPSULE BY MOUTH DAILY 90 capsule 3  . aspirin 81 MG EC tablet TAKE 1 TABLET BY MOUTH EVERY DAY 90 tablet 1  . Cholecalciferol (VITAMIN D3) 2000 units capsule Take 1 capsule (2,000 Units total) by mouth daily. 100 capsule 3  . Cyanocobalamin (VITAMIN B-12) 1000 MCG SUBL Place 1 tablet (1,000 mcg total) under the tongue daily. 100 tablet 3  . gabapentin (NEURONTIN) 300 MG capsule TAKE 1 CAP TWO TIMES DAILY FOR 2 WEEKS THEN INCREASE TO 3 TIMES WEEKLY 270 capsule 3  . hydrochlorothiazide (HYDRODIURIL) 12.5 MG tablet TAKE 1 TABLET BY MOUTH EVERY DAY 100 tablet 4  . ibuprofen (ADVIL,MOTRIN) 600 MG tablet Take 1 tablet (600 mg total) by mouth every 6 (six) hours as needed. 30 tablet 0  . traMADol (ULTRAM) 50 MG tablet Take 1 tablet (50 mg total) by mouth 3 (three) times daily as needed for severe pain. 60 tablet 3   No facility-administered medications prior to visit.     ROS: Review of Systems  Constitutional: Negative for appetite change, fatigue and unexpected weight change.  HENT: Negative for congestion, nosebleeds, sneezing, sore throat and trouble swallowing.   Eyes: Negative for itching and visual disturbance.  Respiratory: Negative for cough.   Cardiovascular: Negative for chest pain, palpitations and leg swelling.  Gastrointestinal: Positive for abdominal distention. Negative for blood in stool, diarrhea and nausea.  Genitourinary: Positive for frequency. Negative for hematuria.    Musculoskeletal: Positive for neck pain. Negative for back pain, gait problem and joint swelling.  Skin: Negative for rash.  Neurological: Negative for dizziness, tremors, speech difficulty and weakness.  Psychiatric/Behavioral: Negative for agitation, dysphoric mood and sleep disturbance. The patient is not nervous/anxious.     Objective:  BP 138/84 (BP Location: Left Arm, Patient Position: Sitting, Cuff Size: Normal)   Pulse 82   Temp 98.6 F (37 C) (Oral)   Ht 5\' 8"  (1.727 m)   Wt 173 lb (78.5 kg)   SpO2 98%   BMI 26.30 kg/m   BP Readings from Last 3 Encounters:  05/15/18 138/84  02/20/18 130/82  12/31/17 (!) 148/83    Wt Readings from Last 3 Encounters:  05/15/18 173 lb (78.5 kg)  02/20/18 176 lb (79.8 kg)  12/31/17 176 lb 9.6 oz (80.1 kg)    Physical Exam  Constitutional: He is oriented to person, place, and time. He appears well-developed. No distress.  NAD  HENT:  Mouth/Throat: Oropharynx is clear and moist.  Eyes: Pupils are equal, round, and reactive to light. Conjunctivae are normal.  Neck: Normal range of motion. No JVD present. No thyromegaly present.  Cardiovascular: Normal rate, regular rhythm, normal heart sounds and intact distal pulses. Exam reveals no gallop and no friction rub.  No murmur heard. Pulmonary/Chest: Effort normal and breath sounds normal. No respiratory distress. He has no wheezes. He has no rales. He exhibits no tenderness.  Abdominal: Soft. Bowel sounds are normal. He exhibits no distension and no  mass. There is no tenderness. There is no rebound and no guarding.  Musculoskeletal: Normal range of motion. He exhibits tenderness. He exhibits no edema.  Lymphadenopathy:    He has no cervical adenopathy.  Neurological: He is alert and oriented to person, place, and time. He has normal reflexes. No cranial nerve deficit. He exhibits normal muscle tone. He displays a negative Romberg sign. Coordination and gait normal.  Skin: Skin is warm and  dry. No rash noted.  Psychiatric: He has a normal mood and affect. His behavior is normal. Judgment and thought content normal.  neck, LS - stiff  Lab Results  Component Value Date   WBC 8.4 07/30/2017   HGB 11.9 (L) 07/30/2017   HCT 37.1 (L) 07/30/2017   PLT 365.0 07/30/2017   GLUCOSE 112 (H) 02/20/2018   CHOL 173 07/30/2017   TRIG 104.0 07/30/2017   HDL 48.90 07/30/2017   LDLDIRECT 163.3 05/26/2008   LDLCALC 104 (H) 07/30/2017   ALT 11 07/30/2017   AST 16 07/30/2017   NA 140 02/20/2018   K 3.7 02/20/2018   CL 105 02/20/2018   CREATININE 1.08 02/20/2018   BUN 12 02/20/2018   CO2 27 02/20/2018   TSH 1.63 07/30/2017   PSA 1.22 02/20/2018   INR 1.12 07/20/2013    Dg Lumbar Spine Complete  Result Date: 10/26/2017 CLINICAL DATA:  Low back pain.  No reported injury. EXAM: LUMBAR SPINE - COMPLETE 4+ VIEW COMPARISON:  None. FINDINGS: This report assumes 5 non rib-bearing lumbar vertebrae. Mild dextrocurvature of the lumbar spine. Lumbar vertebral body heights are preserved, with no fracture. Marked multilevel lumbar degenerative disc disease, most prominent at L2-3, L4-5 and L5-S1. Mild 3 mm retrolisthesis at L2-3 and L3-4. Bilateral lower lumbar facet arthropathy. No aggressive appearing focal osseous lesions. Abdominal aortic atherosclerosis. 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 Mild multilevel degenerative upper lumbar spondylolisthesis. Electronically Signed   By: Delbert Phenix M.D.   On: 10/26/2017 12:00    Assessment & Plan:   There are no diagnoses linked to this encounter.   No orders of the defined types were placed in this encounter.    Follow-up: No follow-ups on file.  Sonda Primes, MD

## 2018-05-15 NOTE — Assessment & Plan Note (Signed)
   Activated charcoal tablets or GasEX pills for gas

## 2018-06-10 ENCOUNTER — Other Ambulatory Visit: Payer: Self-pay | Admitting: Internal Medicine

## 2018-06-10 NOTE — Telephone Encounter (Signed)
Copied from [place] [id number]. Topic: Quick Communication - Rx Refill/Question >> Jun 10, 2018  8:49 AM Eric [name] R wrote: Medication:  traMADol (ULTRAM) 50 MG tablet   Has the patient contacted their pharmacy?Yes  Preferred Pharmacy (with phone number or street name): [id number] [address], [place]. [phone] (Phone) [phone] (Fax)    Agent: Please be advised that RX refills may take up to 3 business days. We ask that you follow-up with your pharmacy.

## 2018-06-12 MED ORDER — TRAMADOL HCL 50 MG PO TABS: 50.0000 mg | ORAL_TABLET | Freq: Three times a day (TID) | ORAL | 3 refills | Status: DC | PRN

## 2018-07-22 ENCOUNTER — Ambulatory Visit (INDEPENDENT_AMBULATORY_CARE_PROVIDER_SITE_OTHER): Payer: Medicare Other | Admitting: Internal Medicine

## 2018-07-22 ENCOUNTER — Encounter: Payer: Self-pay | Admitting: Internal Medicine

## 2018-07-22 ENCOUNTER — Other Ambulatory Visit (INDEPENDENT_AMBULATORY_CARE_PROVIDER_SITE_OTHER): Payer: Medicare Other

## 2018-07-22 DIAGNOSIS — R351 Nocturia: Secondary | ICD-10-CM

## 2018-07-22 DIAGNOSIS — I10 Essential (primary) hypertension: Secondary | ICD-10-CM

## 2018-07-22 DIAGNOSIS — Z0000 Encounter for general adult medical examination without abnormal findings: Secondary | ICD-10-CM | POA: Diagnosis not present

## 2018-07-22 DIAGNOSIS — E538 Deficiency of other specified B group vitamins: Secondary | ICD-10-CM

## 2018-07-22 DIAGNOSIS — N401 Enlarged prostate with lower urinary tract symptoms: Secondary | ICD-10-CM

## 2018-07-22 LAB — BASIC METABOLIC PANEL
BUN: 10 mg/dL (ref 6–23)
CALCIUM: 9.7 mg/dL (ref 8.4–10.5)
CO2: 30 meq/L (ref 19–32)
CREATININE: 0.97 mg/dL (ref 0.40–1.50)
Chloride: 101 mEq/L (ref 96–112)
GFR: 96.36 mL/min (ref >60.00)
Glucose, Bld: 94 mg/dL (ref 70–99)
Potassium: 4.1 mEq/L (ref 3.5–5.1)
Sodium: 138 mEq/L (ref 135–145)

## 2018-07-22 LAB — PSA: PSA: 0.61 ng/mL (ref 0.10–4.00)

## 2018-07-22 MED ORDER — MIRABEGRON ER 50 MG PO TB24: 50.0000 mg | ORAL_TABLET | Freq: Every day | ORAL | 11 refills | Status: DC

## 2018-07-22 NOTE — Progress Notes (Signed)
Subjective:  Patient ID: YICHENG RAYER, male    DOB: 03-08-1941  Age: 77 y.o. MRN: [id number]  CC: No chief complaint on file.   HPI ALIJAH GLISAN presents for frequency - Proscar did not help yet, HTN, B12 def  Outpatient Medications Prior to Visit  Medication Sig Dispense Refill  . acetaminophen (TYLENOL) 325 MG tablet Take 2 tablets (650 mg total) by mouth every 6 (six) hours as needed. 30 tablet 0  . amLODipine-benazepril (LOTREL) 5-20 MG capsule TAKE 1 CAPSULE BY MOUTH DAILY 90 capsule 3  . aspirin 81 MG EC tablet TAKE 1 TABLET BY MOUTH EVERY DAY 90 tablet 1  . Cholecalciferol (VITAMIN D3) 2000 units capsule Take 1 capsule (2,000 Units total) by mouth daily. 100 capsule 3  . Cyanocobalamin (VITAMIN B-12) 1000 MCG SUBL Place 1 tablet (1,000 mcg total) under the tongue daily. 100 tablet 3  . finasteride (PROSCAR) 5 MG tablet Take 1 tablet (5 mg total) by mouth daily. 100 tablet 3  . gabapentin (NEURONTIN) 300 MG capsule TAKE 1 CAP TWO TIMES DAILY FOR 2 WEEKS THEN INCREASE TO 3 TIMES WEEKLY 270 capsule 3  . hydrochlorothiazide (HYDRODIURIL) 12.5 MG tablet TAKE 1 TABLET BY MOUTH EVERY DAY 100 tablet 4  . ibuprofen (ADVIL,MOTRIN) 600 MG tablet Take 1 tablet (600 mg total) by mouth every 6 (six) hours as needed. 30 tablet 0  . traMADol (ULTRAM) 50 MG tablet Take 1 tablet (50 mg total) by mouth 3 (three) times daily as needed for severe pain. 60 tablet 3   No facility-administered medications prior to visit.     ROS: Review of Systems  Constitutional: Negative for appetite change, fatigue and unexpected weight change.  HENT: Negative for congestion, nosebleeds, sneezing, sore throat and trouble swallowing.   Eyes: Negative for itching and visual disturbance.  Respiratory: Negative for cough.   Cardiovascular: Negative for chest pain, palpitations and leg swelling.  Gastrointestinal: Negative for abdominal distention, blood in stool, diarrhea and nausea.  Genitourinary:  Positive for frequency and urgency. Negative for decreased urine volume, difficulty urinating, discharge, enuresis, hematuria and testicular pain.  Musculoskeletal: Positive for arthralgias and back pain. Negative for gait problem, joint swelling and neck pain.  Skin: Negative for rash.  Neurological: Negative for dizziness, tremors, speech difficulty and weakness.  Psychiatric/Behavioral: Negative for agitation, dysphoric mood and sleep disturbance. The patient is not nervous/anxious.     Objective:  BP 138/82 (BP Location: Left Arm, Patient Position: Sitting, Cuff Size: Normal)   Pulse 73   Temp 98 F (36.7 C) (Oral)   Ht 5\' 8"  (1.727 m)   Wt 169 lb (76.7 kg)   SpO2 96%   BMI 25.70 kg/m   BP Readings from Last 3 Encounters:  07/22/18 138/82  05/15/18 138/84  02/20/18 130/82    Wt Readings from Last 3 Encounters:  07/22/18 169 lb (76.7 kg)  05/15/18 173 lb (78.5 kg)  02/20/18 176 lb (79.8 kg)    Physical Exam  Constitutional: He is oriented to person, place, and time. He appears well-developed. No distress.  NAD  HENT:  Mouth/Throat: Oropharynx is clear and moist.  Eyes: Pupils are equal, round, and reactive to light. Conjunctivae are normal.  Neck: Normal range of motion. No JVD present. No thyromegaly present.  Cardiovascular: Normal rate, regular rhythm, normal heart sounds and intact distal pulses. Exam reveals no gallop and no friction rub.  No murmur heard. Pulmonary/Chest: Effort normal and breath sounds normal. No respiratory distress. He has  no wheezes. He has no rales. He exhibits no tenderness.  Abdominal: Soft. Bowel sounds are normal. He exhibits no distension and no mass. There is no tenderness. There is no rebound and no guarding.  Musculoskeletal: Normal range of motion. He exhibits no edema or tenderness.  Lymphadenopathy:    He has no cervical adenopathy.  Neurological: He is alert and oriented to person, place, and time. He has normal reflexes. No  cranial nerve deficit. He exhibits normal muscle tone. He displays a negative Romberg sign. Coordination and gait normal.  Skin: Skin is warm and dry. No rash noted.  Psychiatric: He has a normal mood and affect. His behavior is normal. Judgment and thought content normal.    Lab Results  Component Value Date   WBC 8.4 07/30/2017   HGB 11.9 (L) 07/30/2017   HCT 37.1 (L) 07/30/2017   PLT 365.0 07/30/2017   GLUCOSE 112 (H) 02/20/2018   CHOL 173 07/30/2017   TRIG 104.0 07/30/2017   HDL 48.90 07/30/2017   LDLDIRECT 163.3 05/26/2008   LDLCALC 104 (H) 07/30/2017   ALT 11 07/30/2017   AST 16 07/30/2017   NA 140 02/20/2018   K 3.7 02/20/2018   CL 105 02/20/2018   CREATININE 1.08 02/20/2018   BUN 12 02/20/2018   CO2 27 02/20/2018   TSH 1.63 07/30/2017   PSA 1.22 02/20/2018   INR 1.12 07/20/2013    Dg Lumbar Spine Complete  Result Date: 10/26/2017 CLINICAL DATA:  Low back pain.  No reported injury. EXAM: LUMBAR SPINE - COMPLETE 4+ VIEW COMPARISON:  None. FINDINGS: This report assumes 5 non rib-bearing lumbar vertebrae. Mild dextrocurvature of the lumbar spine. Lumbar vertebral body heights are preserved, with no fracture. Marked multilevel lumbar degenerative disc disease, most prominent at L2-3, L4-5 and L5-S1. Mild 3 mm retrolisthesis at L2-3 and L3-4. Bilateral lower lumbar facet arthropathy. No aggressive appearing focal osseous lesions. Abdominal aortic atherosclerosis. 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 Mild multilevel degenerative upper lumbar spondylolisthesis. Electronically Signed   By: Delbert Phenix M.D.   On: 10/26/2017 12:00    Assessment & Plan:   There are no diagnoses linked to this encounter.   No orders of the defined types were placed in this encounter.    Follow-up: No follow-ups on file.  Sonda Primes, MD

## 2018-07-22 NOTE — Assessment & Plan Note (Signed)
CT ca score info

## 2018-07-22 NOTE — Assessment & Plan Note (Signed)
frequency - Proscar did not help yet Hold  HCTZ x3 d Myrbetriq 50 mg qd

## 2018-07-22 NOTE — Assessment & Plan Note (Signed)
On B12 

## 2018-07-22 NOTE — Patient Instructions (Addendum)
Hold  HCTZ x3 days Start Myrbetriq 50 mg qd  Cardiac CT calcium scoring test $150   Computed tomography, more commonly known as a CT or CAT scan, is a diagnostic medical imaging test. Like traditional x-rays, it produces multiple images or pictures of the inside of the body. The cross-sectional images generated during a CT scan can be reformatted in multiple planes. They can even generate three-dimensional images. These images can be viewed on a computer monitor, printed on film or by a 3D printer, or transferred to a CD or DVD. CT images of internal organs, bones, soft tissue and blood vessels provide greater detail than traditional x-rays, particularly of soft tissues and blood vessels. A cardiac CT scan for coronary calcium is a non-invasive way of obtaining information about the presence, location and extent of calcified plaque in the coronary arteries-the vessels that supply oxygen-containing blood to the heart muscle. Calcified plaque results when there is a build-up of fat and other substances under the inner layer of the artery. This material can calcify which signals the presence of atherosclerosis, a disease of the vessel wall, also called coronary artery disease (CAD). People with this disease have an increased risk for heart attacks. In addition, over time, progression of plaque build up (CAD) can narrow the arteries or even close off blood flow to the heart. The result may be chest pain, sometimes called "angina," or a heart attack. Because calcium is a marker of CAD, the amount of calcium detected on a cardiac CT scan is a helpful prognostic tool. The findings on cardiac CT are expressed as a calcium score. Another name for this test is coronary artery calcium scoring.  What are some common uses of the procedure? The goal of cardiac CT scan for calcium scoring is to determine if CAD is present and to what extent, even if there are no symptoms. It is a screening study that may be  recommended by a physician for patients with risk factors for CAD but no clinical symptoms. The major risk factors for CAD are: . high blood cholesterol levels  . family history of heart attacks  . diabetes  . high blood pressure  . cigarette smoking  . overweight or obese  . physical inactivity   A negative cardiac CT scan for calcium scoring shows no calcification within the coronary arteries. This suggests that CAD is absent or so minimal it cannot be seen by this technique. The chance of having a heart attack over the next two to five years is very low under these circumstances. A positive test means that CAD is present, regardless of whether or not the patient is experiencing any symptoms. The amount of calcification-expressed as the calcium score-may help to predict the likelihood of a myocardial infarction (heart attack) in the coming years and helps your medical doctor or cardiologist decide whether the patient may need to take preventive medicine or undertake other measures such as diet and exercise to lower the risk for heart attack. The extent of CAD is graded according to your calcium score:  Calcium Score  Presence of CAD  0 No evidence of CAD   1-10 Minimal evidence of CAD  11-100 Mild evidence of CAD  101-400 Moderate evidence of CAD  Over 400 Extensive evidence of CAD

## 2018-08-13 ENCOUNTER — Ambulatory Visit: Payer: Self-pay | Admitting: Internal Medicine

## 2018-08-17 ENCOUNTER — Other Ambulatory Visit: Payer: Self-pay | Admitting: Internal Medicine

## 2018-08-17 MED ORDER — AMLODIPINE BESY-BENAZEPRIL HCL 5-20 MG PO CAPS: 1.0000 | ORAL_CAPSULE | Freq: Every day | ORAL | 1 refills | Status: DC

## 2018-08-17 NOTE — Telephone Encounter (Signed)
Requested medication (s) are due for refill today: yes  Requested medication (s) are on the active medication list: yes  Last refill:  08/07/17  Future visit scheduled: yes  Notes to clinic:  This prescription expired on 08/07/18.  Requested Prescriptions  Pending Prescriptions Disp Refills   amLODipine-benazepril (LOTREL) 5-20 MG capsule 90 capsule 3    Sig: Take 1 capsule by mouth daily.     Cardiovascular: CCB + ACEI Combos Passed - 08/17/2018 10:58 AM      Passed - Cr in normal range and within 180 days    Creatinine, Ser  Date Value Ref Range Status  07/22/2018 0.97 0.40 - 1.50 mg/dL Final         Passed - K in normal range and within 180 days    Potassium  Date Value Ref Range Status  07/22/2018 4.1 3.5 - 5.1 mEq/L Final         Passed - Patient is not pregnant      Passed - Last BP in normal range    BP Readings from Last 1 Encounters:  07/22/18 138/82         Passed - Valid encounter within last 6 months    Recent Outpatient Visits          3 weeks ago BPH associated with nocturia   [place], MD   3 months ago Essential hypertension   [place], Evie Lacks, MD   5 months 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

## 2018-08-17 NOTE — Telephone Encounter (Signed)
Copied from [place] [id number]. Topic: Quick Communication - Rx Refill/Question >> Aug 17, 2018  9:27 AM Sheran Luz wrote: Medication: amLODipine-benazepril (LOTREL) 5-20 MG capsule  Patient is requesting a refill of this medication.   Preferred Pharmacy (with phone number or street name):[id number] [address], [place].  [phone]) [phone] (Fax)

## 2018-08-20 ENCOUNTER — Ambulatory Visit: Payer: Self-pay | Admitting: Internal Medicine

## 2018-08-21 ENCOUNTER — Ambulatory Visit: Payer: Self-pay | Admitting: Internal Medicine

## 2018-09-04 ENCOUNTER — Other Ambulatory Visit: Payer: Self-pay | Admitting: Internal Medicine

## 2018-09-04 NOTE — Telephone Encounter (Signed)
Copied from [place] [id number]. Topic: Quick Communication - Rx Refill/Question >> Sep 04, 2018 12:04 PM Sheran Luz wrote: Medication:traMADol (ULTRAM) 50 MG tablet   Patient is requesting a refill of this medication.   Preferred Pharmacy (with phone number or street name):[id number] [address], [place]. [phone] (Phone) [phone] (Fax)

## 2018-09-05 ENCOUNTER — Other Ambulatory Visit: Payer: Self-pay | Admitting: Internal Medicine

## 2018-09-07 NOTE — Telephone Encounter (Signed)
Seth Bake, pts granddaughter, called to f/u on RX for Tramadol stating pharmacy advised no refills left. I called the pharmacy and was told pt had RX filled 10/11, 11/9, 12/3, and 12/20. Earliest refill based on RX for 3/day is 09/08/2018. Please advise.   [id number] [address], [place]. [phone] (Phone) [phone] (Fax)

## 2018-09-11 ENCOUNTER — Other Ambulatory Visit: Payer: Self-pay | Admitting: Internal Medicine

## 2018-09-14 MED ORDER — TRAMADOL HCL 50 MG PO TABS: 50.0000 mg | ORAL_TABLET | Freq: Three times a day (TID) | ORAL | 3 refills | Status: DC | PRN

## 2018-10-26 ENCOUNTER — Ambulatory Visit (INDEPENDENT_AMBULATORY_CARE_PROVIDER_SITE_OTHER): Payer: Medicare Other | Admitting: Internal Medicine

## 2018-10-26 ENCOUNTER — Encounter: Payer: Self-pay | Admitting: Internal Medicine

## 2018-10-26 DIAGNOSIS — R351 Nocturia: Secondary | ICD-10-CM

## 2018-10-26 DIAGNOSIS — N401 Enlarged prostate with lower urinary tract symptoms: Secondary | ICD-10-CM | POA: Diagnosis not present

## 2018-10-26 DIAGNOSIS — I10 Essential (primary) hypertension: Secondary | ICD-10-CM | POA: Diagnosis not present

## 2018-10-26 DIAGNOSIS — E538 Deficiency of other specified B group vitamins: Secondary | ICD-10-CM | POA: Diagnosis not present

## 2018-10-26 MED ORDER — TAMSULOSIN HCL 0.4 MG PO CAPS: 0.4000 mg | ORAL_CAPSULE | Freq: Every day | ORAL | 3 refills | Status: DC

## 2018-10-26 MED ORDER — FINASTERIDE 5 MG PO TABS: 5.0000 mg | ORAL_TABLET | Freq: Every day | ORAL | 3 refills | Status: DC

## 2018-10-26 NOTE — Assessment & Plan Note (Signed)
Lotrel

## 2018-10-26 NOTE — Assessment & Plan Note (Addendum)
D/c HCTZ Proscar Added Flomax D/c Marbetriq  - no help

## 2018-10-26 NOTE — Assessment & Plan Note (Signed)
On B12 

## 2018-10-26 NOTE — Progress Notes (Signed)
Subjective:  Patient ID: Eric [name], male    DOB: August 27, 1941  Age: 78 y.o. MRN: [id number]  CC: No chief complaint on file.   HPI ZEBULEN GALLEN presents for BPH. C/o dribbling F/u B12 def, HTN  Outpatient Medications Prior to Visit  Medication Sig Dispense Refill  . acetaminophen (TYLENOL) 325 MG tablet Take 2 tablets (650 mg total) by mouth every 6 (six) hours as needed. 30 tablet 0  . amLODipine-benazepril (LOTREL) 5-20 MG capsule Take 1 capsule by mouth daily. 90 capsule 1  . aspirin 81 MG EC tablet TAKE 1 TABLET BY MOUTH EVERY DAY 90 tablet 1  . Cholecalciferol (VITAMIN D3) 2000 units capsule Take 1 capsule (2,000 Units total) by mouth daily. 100 capsule 3  . Cyanocobalamin (VITAMIN B-12) 1000 MCG SUBL Place 1 tablet (1,000 mcg total) under the tongue daily. 100 tablet 3  . finasteride (PROSCAR) 5 MG tablet Take 1 tablet (5 mg total) by mouth daily. 100 tablet 3  . gabapentin (NEURONTIN) 300 MG capsule TAKE 1 CAP TWO TIMES DAILY FOR 2 WEEKS THEN INCREASE TO 3 TIMES WEEKLY 270 capsule 3  . hydrochlorothiazide (HYDRODIURIL) 12.5 MG tablet TAKE 1 TABLET BY MOUTH EVERY DAY 100 tablet 4  . ibuprofen (ADVIL,MOTRIN) 600 MG tablet Take 1 tablet (600 mg total) by mouth every 6 (six) hours as needed. 30 tablet 0  . mirabegron ER (MYRBETRIQ) 50 MG TB24 tablet Take 1 tablet (50 mg total) by mouth daily. 30 tablet 11  . traMADol (ULTRAM) 50 MG tablet Take 1 tablet (50 mg total) by mouth 3 (three) times daily as needed for severe pain. 60 tablet 3   No facility-administered medications prior to visit.     ROS: Review of Systems  Constitutional: Negative for appetite change, fatigue and unexpected weight change.  HENT: Negative for congestion, nosebleeds, sneezing, sore throat and trouble swallowing.   Eyes: Negative for itching and visual disturbance.  Respiratory: Negative for cough.   Cardiovascular: Negative for chest pain, palpitations and leg swelling.  Gastrointestinal:  Negative for abdominal distention, blood in stool, diarrhea and nausea.  Genitourinary: Positive for decreased urine volume, difficulty urinating, frequency and urgency. Negative for hematuria.  Musculoskeletal: Negative for back pain, gait problem, joint swelling and neck pain.  Skin: Negative for rash.  Neurological: Negative for dizziness, tremors, speech difficulty and weakness.  Psychiatric/Behavioral: Negative for agitation, dysphoric mood and sleep disturbance. The patient is not nervous/anxious.     Objective:  BP 132/80 (BP Location: Left Arm, Patient Position: Sitting, Cuff Size: Normal)   Pulse 93   Temp 97.7 F (36.5 C) (Oral)   Ht 5\' 8"  (1.727 m)   Wt 162 lb (73.5 kg)   SpO2 97%   BMI 24.63 kg/m   BP Readings from Last 3 Encounters:  10/26/18 132/80  07/22/18 138/82  05/15/18 138/84    Wt Readings from Last 3 Encounters:  10/26/18 162 lb (73.5 kg)  07/22/18 169 lb (76.7 kg)  05/15/18 173 lb (78.5 kg)    Physical Exam Constitutional:      General: He is not in acute distress.    Appearance: He is well-developed.     Comments: NAD  Eyes:     Conjunctiva/sclera: Conjunctivae normal.     Pupils: Pupils are equal, round, and reactive to light.  Neck:     Musculoskeletal: Normal range of motion.     Thyroid: No thyromegaly.     Vascular: No JVD.  Cardiovascular:  Rate and Rhythm: Normal rate and regular rhythm.     Heart sounds: Normal heart sounds. No murmur. No friction rub. No gallop.   Pulmonary:     Effort: Pulmonary effort is normal. No respiratory distress.     Breath sounds: Normal breath sounds. No wheezing or rales.  Chest:     Chest wall: No tenderness.  Abdominal:     General: Bowel sounds are normal. There is no distension.     Palpations: Abdomen is soft. There is no mass.     Tenderness: There is no abdominal tenderness. There is no guarding or rebound.  Musculoskeletal: Normal range of motion.        General: No tenderness.    Lymphadenopathy:     Cervical: No cervical adenopathy.  Skin:    General: Skin is warm and dry.     Findings: No rash.  Neurological:     Mental Status: He is alert and oriented to person, place, and time.     Cranial Nerves: No cranial nerve deficit.     Motor: No abnormal muscle tone.     Coordination: Coordination normal.     Gait: Gait normal.     Deep Tendon Reflexes: Reflexes are normal and symmetric.  Psychiatric:        Behavior: Behavior normal.        Thought Content: Thought content normal.        Judgment: Judgment normal.   a/o/c  Lab Results  Component Value Date   WBC 8.4 07/30/2017   HGB 11.9 (L) 07/30/2017   HCT 37.1 (L) 07/30/2017   PLT 365.0 07/30/2017   GLUCOSE 94 07/22/2018   CHOL 173 07/30/2017   TRIG 104.0 07/30/2017   HDL 48.90 07/30/2017   LDLDIRECT 163.3 05/26/2008   LDLCALC 104 (H) 07/30/2017   ALT 11 07/30/2017   AST 16 07/30/2017   NA 138 07/22/2018   K 4.1 07/22/2018   CL 101 07/22/2018   CREATININE 0.97 07/22/2018   BUN 10 07/22/2018   CO2 30 07/22/2018   TSH 1.63 07/30/2017   PSA 0.61 07/22/2018   INR 1.12 07/20/2013    Dg Lumbar Spine Complete  Result Date: 10/26/2017 CLINICAL DATA:  Low back pain.  No reported injury. EXAM: LUMBAR SPINE - COMPLETE 4+ VIEW COMPARISON:  None. FINDINGS: This report assumes 5 non rib-bearing lumbar vertebrae. Mild dextrocurvature of the lumbar spine. Lumbar vertebral body heights are preserved, with no fracture. Marked multilevel lumbar degenerative disc disease, most prominent at L2-3, L4-5 and L5-S1. Mild 3 mm retrolisthesis at L2-3 and L3-4. Bilateral lower lumbar facet arthropathy. No aggressive appearing focal osseous lesions. Abdominal aortic atherosclerosis. 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 Mild multilevel degenerative upper lumbar spondylolisthesis. Electronically Signed   By: Delbert Phenix M.D.   On: 10/26/2017 12:00    Assessment &  Plan:   There are no diagnoses linked to this encounter.   No orders of the defined types were placed in this encounter.    Follow-up: No follow-ups on file.  Sonda Primes, MD

## 2018-11-24 ENCOUNTER — Other Ambulatory Visit: Payer: Self-pay | Admitting: Internal Medicine

## 2018-11-24 NOTE — Telephone Encounter (Signed)
Copied from [place] [phone]. Topic: Quick Communication - Rx Refill/Question >> Nov 24, 2018 11:07 AM Alanda Slim E wrote: Medication:  traMADol (ULTRAM) 50 MG tablet [[id number]]    Has the patient contacted their pharmacy? No   Preferred Pharmacy (with phone number or street name): [id number] - [place], [place].  Agent: Please be advised that RX refills may take up to 3 business days. We ask that you follow-up with your pharmacy.

## 2018-11-26 MED ORDER — TRAMADOL HCL 50 MG PO TABS: 50.0000 mg | ORAL_TABLET | Freq: Three times a day (TID) | ORAL | 3 refills | Status: DC | PRN

## 2018-12-21 ENCOUNTER — Encounter: Payer: Self-pay | Admitting: Internal Medicine

## 2018-12-21 ENCOUNTER — Ambulatory Visit (INDEPENDENT_AMBULATORY_CARE_PROVIDER_SITE_OTHER): Payer: Medicare Other | Admitting: Internal Medicine

## 2018-12-21 DIAGNOSIS — E538 Deficiency of other specified B group vitamins: Secondary | ICD-10-CM | POA: Diagnosis not present

## 2018-12-21 DIAGNOSIS — M542 Cervicalgia: Secondary | ICD-10-CM | POA: Diagnosis not present

## 2018-12-21 DIAGNOSIS — M79602 Pain in left arm: Secondary | ICD-10-CM | POA: Diagnosis not present

## 2018-12-21 DIAGNOSIS — E559 Vitamin D deficiency, unspecified: Secondary | ICD-10-CM | POA: Diagnosis not present

## 2018-12-21 DIAGNOSIS — I10 Essential (primary) hypertension: Secondary | ICD-10-CM

## 2018-12-21 MED ORDER — GABAPENTIN 300 MG PO CAPS: ORAL_CAPSULE | ORAL | 3 refills | Status: DC

## 2018-12-21 NOTE — Assessment & Plan Note (Signed)
Gabapentin

## 2018-12-21 NOTE — Assessment & Plan Note (Signed)
On B12 

## 2018-12-21 NOTE — Assessment & Plan Note (Signed)
On Vit D 

## 2018-12-21 NOTE — Progress Notes (Signed)
Virtual Visit via Telephone Note  I connected with Tristan Schroeder on 12/21/18 at  3:00 PM EDT by telephone and verified that I am speaking with the correct person using two identifiers.   I discussed the limitations, risks, security and privacy concerns of performing an evaluation and management service by telephone and the availability of in person appointments. I also discussed with the patient that there may be a patient responsible charge related to this service. The patient expressed understanding and agreed to proceed.   History of Present Illness:   Follow-up on hypertension, cervical pain, vitamin B12 deficiency Observations/Objective:  The patient is in no acute distress.  He looks well. Assessment and Plan:  See plan Follow Up Instructions:    I discussed the assessment and treatment plan with the patient. The patient was provided an opportunity to ask questions and all were answered. The patient agreed with the plan and demonstrated an understanding of the instructions.   The patient was advised to call back or seek an in-person evaluation if the symptoms worsen or if the condition fails to improve as anticipated.  I provided 20 minutes of non-face-to-face time during this encounter.   Walker Kehr, MD

## 2018-12-21 NOTE — Assessment & Plan Note (Signed)
Tramadol prn 

## 2018-12-21 NOTE — Assessment & Plan Note (Signed)
Lotrel

## 2019-01-06 ENCOUNTER — Ambulatory Visit: Payer: Self-pay | Admitting: Adult Health

## 2019-01-06 ENCOUNTER — Ambulatory Visit: Payer: Self-pay | Admitting: Nurse Practitioner

## 2019-01-22 ENCOUNTER — Other Ambulatory Visit: Payer: Self-pay | Admitting: Internal Medicine

## 2019-02-17 ENCOUNTER — Other Ambulatory Visit: Payer: Self-pay | Admitting: Internal Medicine

## 2019-02-22 ENCOUNTER — Other Ambulatory Visit: Payer: Self-pay | Admitting: Internal Medicine

## 2019-02-25 DIAGNOSIS — H5703 Miosis: Secondary | ICD-10-CM | POA: Diagnosis not present

## 2019-02-25 DIAGNOSIS — H35371 Puckering of macula, right eye: Secondary | ICD-10-CM | POA: Diagnosis not present

## 2019-02-25 DIAGNOSIS — H2513 Age-related nuclear cataract, bilateral: Secondary | ICD-10-CM | POA: Diagnosis not present

## 2019-02-25 DIAGNOSIS — H353111 Nonexudative age-related macular degeneration, right eye, early dry stage: Secondary | ICD-10-CM | POA: Diagnosis not present

## 2019-02-25 DIAGNOSIS — H53462 Homonymous bilateral field defects, left side: Secondary | ICD-10-CM | POA: Diagnosis not present

## 2019-03-20 ENCOUNTER — Other Ambulatory Visit: Payer: Self-pay | Admitting: Internal Medicine

## 2019-04-18 ENCOUNTER — Other Ambulatory Visit: Payer: Self-pay | Admitting: Internal Medicine

## 2019-04-21 ENCOUNTER — Encounter: Payer: Self-pay | Admitting: Adult Health

## 2019-04-21 ENCOUNTER — Ambulatory Visit: Payer: Self-pay | Admitting: Adult Health

## 2019-04-29 ENCOUNTER — Encounter: Payer: Self-pay | Admitting: Adult Health

## 2019-04-29 ENCOUNTER — Other Ambulatory Visit: Payer: Self-pay

## 2019-04-29 ENCOUNTER — Ambulatory Visit (INDEPENDENT_AMBULATORY_CARE_PROVIDER_SITE_OTHER): Payer: Medicare Other | Admitting: Adult Health

## 2019-04-29 VITALS — BP 155/85 | HR 69 | Temp 97.1°F | Ht 67.0 in | Wt 162.8 lb

## 2019-04-29 DIAGNOSIS — I611 Nontraumatic intracerebral hemorrhage in hemisphere, cortical: Secondary | ICD-10-CM

## 2019-04-29 DIAGNOSIS — M792 Neuralgia and neuritis, unspecified: Secondary | ICD-10-CM | POA: Diagnosis not present

## 2019-04-29 DIAGNOSIS — I69398 Other sequelae of cerebral infarction: Secondary | ICD-10-CM | POA: Diagnosis not present

## 2019-04-29 MED ORDER — PREGABALIN 50 MG PO CAPS: 50.0000 mg | ORAL_CAPSULE | Freq: Every day | ORAL | 0 refills | Status: DC

## 2019-04-29 NOTE — Patient Instructions (Addendum)
Your Plan:  We will trial use of pregabalin (Lyrica) 50 mg daily with discontinuing gabapentin  Continue aspirin 81 mg daily for secondary stroke prevention  Continue to follow with PCP for HTN and HLD management          Thank you for coming to see [place] at [facility]. I hope we have been able to provide you high quality care today.  You may receive a patient satisfaction survey over the next few weeks. We would appreciate your feedback and comments so that we may continue to improve ourselves and the health of our patients.   Pregabalin capsules What is this medicine? PREGABALIN (pre GAB a lin) is used to treat nerve pain from diabetes, shingles, spinal cord injury, and fibromyalgia. It is also used to control seizures in epilepsy. This medicine may be used for other purposes; ask your health care provider or pharmacist if you have questions. COMMON BRAND NAME(S): Lyrica What should I tell my health care provider before I take this medicine? They need to know if you have any of these conditions:  heart disease  history of drug abuse or alcohol abuse problem  kidney disease  lung or breathing disease  suicidal thoughts, plans, or attempt; a previous suicide attempt by you or a family member  an unusual or allergic reaction to pregabalin, gabapentin, other medicines, foods, dyes, or preservatives  pregnant or trying to get pregnant  breast-feeding How should I use this medicine? Take this medicine by mouth with a glass of water. Follow the directions on the prescription label. You can take it with or without food. If it upsets your stomach, take it with food. Take your medicine at regular intervals. Do not take it more often than directed. Do not stop taking except on your doctor's advice. A special MedGuide will be given to you by the pharmacist with each prescription and refill. Be sure to read this information carefully each time. Talk to your  pediatrician regarding the use of this medicine in children. While this drug may be prescribed for children as young as 1 month for selected conditions, precautions do apply. Overdosage: If you think you have taken too much of this medicine contact a poison control center or emergency room at once. NOTE: This medicine is only for you. Do not share this medicine with others. What if I miss a dose? If you miss a dose, take it as soon as you can. If it is almost time for your next dose, take only that dose. Do not take double or extra doses. What may interact with this medicine? This medicine may interact with the following medications:  alcohol  antihistamines for allergy, cough, and cold  certain medicines for anxiety or sleep  certain medicines for depression like amitriptyline, fluoxetine, sertraline  certain medicines for diabetes  certain medicines for seizures like phenobarbital, primidone  general anesthetics like halothane, isoflurane, methoxyflurane, propofol  local anesthetics like lidocaine, pramoxine, tetracaine  medicines that relax muscles for surgery  narcotic medicines for pain  phenothiazines like chlorpromazine, mesoridazine, prochlorperazine, thioridazine This list may not describe all possible interactions. Give your health care provider a list of all the medicines, herbs, non-prescription drugs, or dietary supplements you use. Also tell them if you smoke, drink alcohol, or use illegal drugs. Some items may interact with your medicine. What should I watch for while using this medicine? Tell your doctor or healthcare professional if your symptoms do not start to get better or if they  get worse. Visit your doctor or health care professional for regular checks on your progress. Do not stop taking except on your doctor's advice. You may develop a severe reaction. Your doctor will tell you how much medicine to take. Wear a medical identification bracelet or chain if you  are taking this medicine for seizures, and carry a card that describes your disease and details of your medicine and dosage times. You may get drowsy or dizzy. Do not drive, use machinery, or do anything that needs mental alertness until you know how this medicine affects you. Do not stand or sit up quickly, especially if you are an older patient. This reduces the risk of dizzy or fainting spells. Alcohol may interfere with the effect of this medicine. Avoid alcoholic drinks. If you have a heart condition, like congestive heart failure, and notice that you are retaining water and have swelling in your hands or feet, contact your health care provider immediately. The use of this medicine may increase the chance of suicidal thoughts or actions. Pay special attention to how you are responding while on this medicine. Any worsening of mood, or thoughts of suicide or dying should be reported to your health care professional right away. This medicine has caused reduced sperm counts in some men. This may interfere with the ability to father a child. You should talk to your doctor or health care professional if you are concerned about your fertility. Women who become pregnant while using this medicine for seizures may enroll in the [place] Pregnancy Registry by calling [phone]. This registry collects information about the safety of antiepileptic drug use during pregnancy. What side effects may I notice from receiving this medicine? Side effects that you should report to your doctor or health care professional as soon as possible:  allergic reactions like skin rash, itching or hives, swelling of the face, lips, or tongue  breathing problems  changes in vision  chest pain  confusion  jerking or unusual movements of any part of your body  loss of memory  muscle pain, tenderness, or weakness  suicidal thoughts or other mood changes  swelling of the ankles, feet,  hands  unusual bruising or bleeding Side effects that usually do not require medical attention (report to your doctor or health care professional if they continue or are bothersome):  dizziness  drowsiness  dry mouth  headache  nausea  tremors  trouble sleeping  weight gain This list may not describe all possible side effects. Call your doctor for medical advice about side effects. You may report side effects to FDA at 1-800-FDA-1088. Where should I keep my medicine? Keep out of the reach of children. This medicine can be abused. Keep your medicine in a safe place to protect it from theft. Do not share this medicine with anyone. Selling or giving away this medicine is dangerous and against the law. This medicine may cause accidental overdose and death if it taken by other adults, children, or pets. Mix any unused medicine with a substance like cat litter or coffee grounds. Then throw the medicine away in a sealed container like a sealed bag or a coffee can with a lid. Do not use the medicine after the expiration date. Store at room temperature between 15 and 30 degrees C (59 and 86 degrees F). NOTE: This sheet is a summary. It may not cover all possible information. If you have questions about this medicine, talk to your doctor, pharmacist, or health care provider.  2020 [employer]/Gold Standard (2018-08-21 13:15:55)

## 2019-04-29 NOTE — Progress Notes (Signed)
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  UPDATE 06/13/14 (LL): He returns for stroke follow up.  He states his headaches have persisted, but Tramadol does help if he takes 2. He feels his vision is much improved and he has adapted to his peripheral vision loss. Left arm pain persists and is most bothersome at night. Gabapentin does help but he does not often take this during the day. He did not find any benefit from Topamax for headaches and stopped it on his own.  He does not drive anymore. His daughter is concerned that he has to clear his throat often, but he states that he does not get choked on foods or drinks. He states his blood pressure is well controlled although it is elevated in the office today, 151/85. He has no new complaints.  UPDATE 12/10/13 (LL): Eric. [name] returns for stroke follow up. He states he feels good and his headaches are getting better. He still has left peripheral vision deficit. His speech is slurred only when he is very tired. His strength has returned but complains of left arm pain at times, mostly at night. His BP is well controlled, it is 113/67 in the office today.  On 07/02/13, he presented to ER with sudden onset of slurred speech, left facial weakness and left hemiparesthesias as well as right frontotemporal headache. CT scan of the head showed a 4 cm right temporoparietal parenchymal hemorrhage with surrounding edema and mass effect. NIH stroke scale was 14. He was admitted to the intensive care unit and blood pressure tightly controlled. Admission blood pressure was elevated at 184/100. Followed imaging study showed stable appearance of the hemorrhage. MRI scan the brain confirmed the  parenchyma right temporoparietal hemorrhage with volume of 28 cubic cc. MRI of the brain showed no large vessel occlusion. Transthoracic echo showed normal ejection fraction. Carotid ultrasound showed no stenosis. EKG showed regular sinus rhythm. Urine drug screen was negative. Vascular risk factors identified included mild hyperlipidemia with LDL of 104. Patient had persistent headache and was treated with Demerol. He had left homonymous and a hemianopsia and mild left-sided weakness and sensory loss. Clinical status improved he was discharged home. He states his done well his speech and left-sided weakness and numbness is a lot better. Headaches improved but are still persistent particularly at night he has right frontal and periorbital headache which usually responds to come about but at times it is quite disabling yet. He has occasional slurred speech off and on. She states his blood pressure has been quite well controlled at home. He has an appointment with his family physician Dr. Tawanna Cooler next month. He admits to mild short-term memory difficulties but these are not bothersome. He had followup CT scan of the head done on 07/23/13 which showed expectoration or he change in the hemorrhage with mild persistent edema and mass effect.  Update 06/15/2015 : He returns for follow-up after last visit 6 months ago. He states improvement in his headaches with Topamax and tramadol. Is tolerating Topamax 50 mg twice daily without side effects and takes tramadol as needed. His headache frequency is now once or twice a week and then not as disabling. He also takes gabapentin which helps his arm pain. States his  blood pressure is usually well controlled though it is slightly elevated in office today. Patient continues to have some mild vision difficulties from his remote brain hemorrhage now more than 2 years ago. He has no new complaints. Update 11/15/2015 : Patient is seen urgently today following a recent trip to the  emergency room on 11/09/15. He is accompanied by his daughter. Patient states that he developed sudden onset of dizziness, blurred vision, staggering gait as well as right arm pain as well as chest pain. He was seen in the emergency room where an urgent MRI scan of the brain was obtained which  I personally reviewedshowed no acute abnormality. Patient's basic lab work was unremarkable except for slightly low fashion of 3.1. Her personally reviewed the films and physician notes. Patient's is unable to pinpoint as to why he had the symptoms but they recovered within a day or so. He does occasionally get some numbness in his right hand as well as some vision difficulties as well as Dr. fields at times his speech is difficult to understand and slurred. He is not had any witnessed seizure activities, significant fall, loss of consciousness. Patient has discontinued Topamax as apparently didn't help his headaches. He is also not taking gabapentin regularly. He does take 2-3 tablets of tramadol every day and states that his headaches are doing very well. He still has some vision difficulties following his deficits from remote intracerebral hemorrhage more than 78 years ago. He states his blood pressure is well controlled and today it is 128/79. Update 7/5/2017PS : He returns for follow-up after last visit 3 months ago. Is accompanied by his wife. He states that he still gets headaches off and on. He is tolerating Topamax 50 mg twice daily as well as gabapentin 303 times daily. Headache frequency may have "gone down but his headache still pretty severe. Tramadol seems to help but he ran out of his prescription. Still has some paresthesias in his hands as well. He had follow-up carotid ultrasound done on 11/29/15 showed no significant extracranial stenosis. EEG done on 12/04/15 was normal. He has not had any recurrent stroke or TIA symptoms. He remains on aspirin which is tolerating well without bruising or bleeding. States his  blood pressure is well controlled and today it is 137/76. He states his sugars also better controlled his cannot and given the past and intermittent A1c checked. He wants a refill of the tramadol UPDATE 10/26/2017CM Eric. [name], 78 year old male returns for follow-up. His headaches are much better. His daughter has now started monitoring his medications because he was taking his. Tramadol thinking it was his blood pressure medication. He is only to use that when necessary no more than twice a week. He is also on Topamax 50 mg 1 in the morning and 2 at night and she takes Neurontin 300mg  3 times daily. He is on aspirin for secondary stroke prevention and has not had further stroke or TIA symptoms. He has not had any bruising or bleeding. Blood pressure is well controlled in the office today 134/82 he returns for reevaluation  UPDATE 04/26/2018CM Eric [name], 78 year old male returns for follow-up with his daughter. He reports that his headaches are much better he has stopped his Topamax. He remains on Neurontin. He remains on aspirin for secondary stroke prevention and has not had further stroke or TIA symptoms. He has no bruising and no bleeding. Blood pressure in the office today 146/84. He takes vitamin B12 supplement. He walks for exercise. He denies any  recent falls. He returns for reevaluation . His labs are followed by his primary care. UPDATE 5/1/2019CM Eric [name] 78 year old male returns for follow-up with his daughter,.  His headaches are much better and he has stopped Topamax.  He continues to complain of left arm pain and has stopped his gabapentin since last seen.  He remains on aspirin for secondary stroke prevention without further stroke or TIA symptoms.  He has no bruising and no bleeding blood pressure in the office today 148/83 no new neurologic symptoms he returns for reevaluation  Update 04/29/2019: Eric [name] is a 78 year old male who is being seen today for follow-up regarding  headaches and and right arm pain with prior stroke.  He is accompanied by his daughter.  Residual stroke deficits left-sided vision loss and gait abnormality.  He denies recurrent headaches.  It was previously recommended to initiate gabapentin due to ongoing left arm pain as he previously discontinued.  He does endorse trialing but as it did not give him benefit, he discontinued.  He is questioning use of Lyrica as he admits to trialing a friend's dose and gave him benefit.  He continues to live with his wife and is able to maintain ADLs independently.  Continues on aspirin 81 mg for secondary stroke prevention.  Blood pressure today 155/85.  Denies new or worsening stroke/TIA symptoms.   REVIEW OF SYSTEMS: Full 14 system review of systems performed and notable only for those listed, all others are neg:  Constitutional: neg Cardiovascular: neg Ear/Nose/Throat: neg  Skin: neg Eyes: Decreased vision Respiratory: neg Gastroitestinal: neg  Hematology/Lymphatic: neg  Endocrine: neg Musculoskeletal:  arm pain Allergy/Immunology: neg Neurological: Headache, history of stroke Psychiatric: neg Sleep : neg   ALLERGIES: Allergies  Allergen Reactions   Hctz [Hydrochlorothiazide]     Peeing too much    HOME MEDICATIONS: Outpatient Medications Prior to Visit  Medication Sig Dispense Refill   amLODipine-benazepril (LOTREL) 5-20 MG capsule TAKE 1 CAPSULE BY MOUTH EVERY DAY 90 capsule 1   aspirin 81 MG EC tablet TAKE 1 TABLET BY MOUTH EVERY DAY 90 tablet 1   Cholecalciferol (VITAMIN D3) 2000 units capsule Take 1 capsule (2,000 Units total) by mouth daily. 100 capsule 3   Cyanocobalamin (VITAMIN B-12) 1000 MCG SUBL Place 1 tablet (1,000 mcg total) under the tongue daily. 100 tablet 3   finasteride (PROSCAR) 5 MG tablet TAKE 1 TABLET BY MOUTH EVERY DAY 90 tablet 3   gabapentin (NEURONTIN) 300 MG capsule TAKE 1 CAP TWO TIMES DAILY FOR 2 WEEKS THEN INCREASE TO 3 TIMES WEEKLY 270 capsule 3    Naproxen Sod-diphenhydrAMINE (ALEVE PM PO) Take 1 tablet by mouth daily as needed.     tamsulosin (FLOMAX) 0.4 MG CAPS capsule TAKE 1 CAPSULE BY MOUTH EVERY DAY 90 capsule 1   traMADol (ULTRAM) 50 MG tablet TAKE 1 TABLET (50 MG TOTAL) BY MOUTH 3 (THREE) TIMES DAILY AS NEEDED FOR SEVERE PAIN. 60 tablet 3   acetaminophen (TYLENOL) 325 MG tablet Take 2 tablets (650 mg total) by mouth every 6 (six) hours as needed. 30 tablet 0   ibuprofen (ADVIL,MOTRIN) 600 MG tablet Take 1 tablet (600 mg total) by mouth every 6 (six) hours as needed. 30 tablet 0   No facility-administered medications prior to visit.     PAST MEDICAL HISTORY: Past Medical History:  Diagnosis Date   Benign prostatic hypertrophy    COPD (chronic obstructive pulmonary disease) (HCC)    in past   Difficulty reading  and writing   ED (erectile dysfunction)    Gynecomastia, 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   Hypertension    Stroke (HCC) 06/2014    PAST SURGICAL HISTORY: Past Surgical History:  Procedure Laterality Date   bilateral breast removal     COLONOSCOPY  04/24/05    FAMILY HISTORY: Family History  Problem Relation Age of Onset   Arthritis Mother    Diabetes Father    Diabetes Other    Hypertension Other     SOCIAL HISTORY: Social History   Socioeconomic History   Marital status: Married    Spouse name: Nellie   Number of children: 6   Years of education: 10th   Highest education level: Not on file  Occupational History   Occupation: retired  Ecologist strain: Not on file   Food insecurity    Worry: Not on file    Inability: Not on Occupational hygienist needs    Medical: Not on file    Non-medical: Not on file  Tobacco Use   Smoking status: Former Smoker    Quit date: 09/22/1999    Years since quitting: 19.6   Smokeless tobacco: Never Used  Substance and Sexual Activity   Alcohol use: No    Alcohol/week: 0.0 standard drinks   Drug  use: No   Sexual activity: Yes  Lifestyle   Physical activity    Days per week: Not on file    Minutes per session: Not on file   Stress: Not on file  Relationships   Social connections    Talks on phone: Not on file    Gets together: Not on file    Attends religious service: Not on file    Active member of club or organization: Not on file    Attends meetings of clubs or organizations: Not on file    Relationship status: Not on file   Intimate partner violence    Fear of current or ex partner: Not on file    Emotionally abused: Not on file    Physically abused: Not on file    Forced sexual activity: Not on file  Other Topics Concern   Not on file  Social History Narrative   Patient lives at home with wife, consumes no caffeine           PHYSICAL EXAM  Vitals:   04/29/19 1605  BP: (!) 155/85  Pulse: 69  Temp: (!) 97.1 F (36.2 C)  Weight: 162 lb 12.8 oz (73.8 kg)  Height: 5\' 7"  (1.702 m)   Body mass index is 25.5 kg/m.  Generalized: Well developed, pleasant elderly African-American male, in no acute distress  Head: normocephalic and atraumatic,. Oropharynx benign  Neck: Supple, no carotid bruits  Cardiac: Regular rate rhythm, no murmur  Musculoskeletal: No deformity   Neurological examination   Mentation: Alert oriented to time, place, history taking. Attention span and concentration appropriate. Recent and remote memory intact.  Follows all commands speech and language fluent.  Cranial nerve II-XII: .Pupils were equal round reactive to light extraocular movements were full, Visual fields showed partial left homonymous hemianopsia to confrontation. Facial sensation and strength were normal. hearing was intact to finger rubbing bilaterally. Uvula tongue midline. head turning and shoulder shrug were normal and symmetric.Tongue protrusion into cheek strength was normal. Motor: normal bulk and tone, full strength in the BUE, BLE, except mild increased tone in the  left leg with stiffness Sensory: normal and symmetric  to light touch, pinprick, and  Vibration, in the upper and lower extremities, mildly decreased to modalities on the left Coordination: finger-nose-finger, heel-to-shin bilaterally, no dysmetria Reflexes: 1+ upper lower and symmetric, plantar responses were flexor bilaterally. Gait and Station: Rising up from seated position without assistance, normal stance,  moderate stride with stiffened left leg and occasional scuffing of sole of left foot but is able to ambulate without assistive device for balance difficulty    DIAGNOSTIC DATA (LABS, IMAGING, TESTING) - I reviewed patient records, labs, notes, testing and imaging myself where available.  Lab Results  Component Value Date   WBC 8.4 07/30/2017   HGB 11.9 (L) 07/30/2017   HCT 37.1 (L) 07/30/2017   MCV 80.6 07/30/2017   PLT 365.0 07/30/2017      Component Value Date/Time   NA 138 07/22/2018 1602   K 4.1 07/22/2018 1602   CL 101 07/22/2018 1602   CO2 30 07/22/2018 1602   GLUCOSE 94 07/22/2018 1602   BUN 10 07/22/2018 1602   CREATININE 0.97 07/22/2018 1602   CALCIUM 9.7 07/22/2018 1602   PROT 7.5 07/30/2017 1610   ALBUMIN 4.1 07/30/2017 1610   AST 16 07/30/2017 1610   ALT 11 07/30/2017 1610   ALKPHOS 78 07/30/2017 1610   BILITOT 0.4 07/30/2017 1610   GFRNONAA >60 11/08/2015 2151   GFRAA >60 11/08/2015 2151    ASSESSMENT AND PLAN 82 year african [place] male with a 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 Clinical doing well with resolution of right frontal headaches, continued left hemianopsia.   Tramadol has been effective for symptomatic relief of right arm pain post stroke.  He denies benefit with use of gabapentin and is requesting initiation of pregabalin as he tried a dose from his friend and received benefit.  He denies ongoing headaches.  Residual deficits of left visual loss  and gait difficulty with left leg stiffness.   PLAN:  -Will initiate pregabalin 50 mg daily for post stroke right arm pain -Discontinue gabapentin due to no benefit -Continue aspirin 81 mg daily  for secondary stroke prevention -Encouraged increasing activity and daily exercise but maintaining a healthy diet -maintain strict control of hypertension with blood pressure goal below 130/90, lipids with LDL cholesterol goal below 70 mg/dL with ongoing DM and HLD management by PCP  Request ongoing management and/or refills by PCP as he is overall stable from a neurological standpoint  I spent 25 min  in total face to face time with the patient and daughter more than 50% of which was spent counseling and coordination of care, discussing ongoing stroke deficits with resolution of frontal headaches but ongoing difficulties with right arm pain.  Answered all questions to patient and daughter satisfaction.  Ihor Austin, AGNP-BC  [facility] [address] [place], [place] [address]  Phone [phone] Fax [phone] Note: This document was prepared with digital dictation and possible smart phrase technology. Any transcriptional errors that result from this process are unintentional.

## 2019-04-30 NOTE — Progress Notes (Signed)
I agree with the above plan 

## 2019-05-14 ENCOUNTER — Other Ambulatory Visit: Payer: Self-pay | Admitting: Internal Medicine

## 2019-05-14 ENCOUNTER — Telehealth: Payer: Self-pay | Admitting: Internal Medicine

## 2019-05-14 NOTE — Telephone Encounter (Signed)
Medication: amLODipine-benazepril (LOTREL) 5-20 MG capsule [id number]   Has the patient contacted their pharmacy? Yes  (Agent: If no, request that the patient contact the pharmacy for the refill.) (Agent: If yes, when and what did the pharmacy advise?)  Preferred Pharmacy (with phone number or street name): [id number] [address], [place]. [phone] (Phone) [phone] (Fax)   Agent: Please be advised that RX refills may take up to 3 business days. We ask that you follow-up with your pharmacy.

## 2019-05-14 NOTE — Telephone Encounter (Signed)
RX sent

## 2019-06-29 ENCOUNTER — Other Ambulatory Visit: Payer: Self-pay | Admitting: Internal Medicine

## 2019-07-20 ENCOUNTER — Other Ambulatory Visit: Payer: Self-pay | Admitting: Internal Medicine

## 2019-07-20 NOTE — Telephone Encounter (Signed)
Copied from [place] [id number]. Topic: Quick Communication - Rx Refill/Question >> Jul 20, 2019  4:31 PM Leward Quan A wrote: Medication: traMADol (ULTRAM) 50 MG tablet   Patient would like multiple refills or at least 120 pills on the refill  Has the patient contacted their pharmacy? Yes.   (Agent: If no, request that the patient contact the pharmacy for the refill.) (Agent: If yes, when and what did the pharmacy advise?)  Preferred Pharmacy (with phone number or street name): [id number] [address], [place]. [phone] (Phone) [phone] (Fax)    Agent: Please be advised that RX refills may take up to 3 business days. We ask that you follow-up with your pharmacy.

## 2019-07-20 NOTE — Telephone Encounter (Signed)
Routing to CMA 

## 2019-07-21 NOTE — Telephone Encounter (Signed)
Please advise 

## 2019-08-04 ENCOUNTER — Other Ambulatory Visit: Payer: Self-pay

## 2019-08-04 ENCOUNTER — Ambulatory Visit (INDEPENDENT_AMBULATORY_CARE_PROVIDER_SITE_OTHER): Payer: Medicare Other | Admitting: Internal Medicine

## 2019-08-04 ENCOUNTER — Encounter: Payer: Self-pay | Admitting: Internal Medicine

## 2019-08-04 ENCOUNTER — Other Ambulatory Visit (INDEPENDENT_AMBULATORY_CARE_PROVIDER_SITE_OTHER): Payer: Medicare Other

## 2019-08-04 VITALS — BP 162/78 | HR 74 | Temp 98.5°F | Ht 67.0 in | Wt 159.0 lb

## 2019-08-04 DIAGNOSIS — E559 Vitamin D deficiency, unspecified: Secondary | ICD-10-CM

## 2019-08-04 DIAGNOSIS — I10 Essential (primary) hypertension: Secondary | ICD-10-CM | POA: Diagnosis not present

## 2019-08-04 DIAGNOSIS — E876 Hypokalemia: Secondary | ICD-10-CM | POA: Diagnosis not present

## 2019-08-04 DIAGNOSIS — E538 Deficiency of other specified B group vitamins: Secondary | ICD-10-CM

## 2019-08-04 DIAGNOSIS — N401 Enlarged prostate with lower urinary tract symptoms: Secondary | ICD-10-CM

## 2019-08-04 DIAGNOSIS — R351 Nocturia: Secondary | ICD-10-CM

## 2019-08-04 DIAGNOSIS — N320 Bladder-neck obstruction: Secondary | ICD-10-CM | POA: Diagnosis not present

## 2019-08-04 LAB — LIPID PANEL
Cholesterol: 158 mg/dL (ref 0–200)
HDL: 52.5 mg/dL (ref >39.00)
LDL Cholesterol: 90 mg/dL (ref 0–99)
NonHDL: 105.93
Total CHOL/HDL Ratio: 3
Triglycerides: 80 mg/dL (ref 0.0–149.0)
VLDL: 16 mg/dL (ref 0.0–40.0)

## 2019-08-04 LAB — CBC WITH DIFFERENTIAL/PLATELET
Basophils Absolute: 0.1 10*3/uL (ref 0.0–0.1)
Basophils Relative: 1.1 % (ref 0.0–3.0)
Eosinophils Absolute: 0 10*3/uL (ref 0.0–0.7)
Eosinophils Relative: 0.7 % (ref 0.0–5.0)
HCT: 32.6 % — ABNORMAL LOW (ref 39.0–52.0)
Hemoglobin: 10.3 g/dL — ABNORMAL LOW (ref 13.0–17.0)
Lymphocytes Relative: 25.8 % (ref 12.0–46.0)
Lymphs Abs: 1.9 10*3/uL (ref 0.7–4.0)
MCHC: 31.6 g/dL (ref 30.0–36.0)
MCV: 73.5 fl — ABNORMAL LOW (ref 78.0–100.0)
Monocytes Absolute: 0.4 10*3/uL (ref 0.1–1.0)
Monocytes Relative: 5.6 % (ref 3.0–12.0)
Neutro Abs: 4.9 10*3/uL (ref 1.4–7.7)
Neutrophils Relative %: 66.8 % (ref 43.0–77.0)
Platelets: 414 10*3/uL — ABNORMAL HIGH (ref 150.0–400.0)
RBC: 4.43 Mil/uL (ref 4.22–5.81)
RDW: 16.7 % — ABNORMAL HIGH (ref 11.5–15.5)
WBC: 7.4 10*3/uL (ref 4.0–10.5)

## 2019-08-04 LAB — TSH: TSH: 1.08 u[IU]/mL (ref 0.35–4.50)

## 2019-08-04 LAB — BASIC METABOLIC PANEL
BUN: 9 mg/dL (ref 6–23)
CO2: 28 mEq/L (ref 19–32)
Calcium: 9.2 mg/dL (ref 8.4–10.5)
Chloride: 104 mEq/L (ref 96–112)
Creatinine, Ser: 0.91 mg/dL (ref 0.40–1.50)
GFR: 97.33 mL/min (ref >60.00)
Glucose, Bld: 106 mg/dL — ABNORMAL HIGH (ref 70–99)
Potassium: 3.9 mEq/L (ref 3.5–5.1)
Sodium: 138 mEq/L (ref 135–145)

## 2019-08-04 LAB — HEPATIC FUNCTION PANEL
ALT: 10 U/L (ref 0–53)
AST: 13 U/L (ref 0–37)
Albumin: 4 g/dL (ref 3.5–5.2)
Alkaline Phosphatase: 66 U/L (ref 39–117)
Bilirubin, Direct: 0.1 mg/dL (ref 0.0–0.3)
Total Bilirubin: 0.4 mg/dL (ref 0.2–1.2)
Total Protein: 7.1 g/dL (ref 6.0–8.3)

## 2019-08-04 LAB — VITAMIN B12: Vitamin B-12: 1462 pg/mL — ABNORMAL HIGH (ref 211–911)

## 2019-08-04 LAB — PSA: PSA: 0.13 ng/mL (ref 0.10–4.00)

## 2019-08-04 MED ORDER — OXYBUTYNIN CHLORIDE ER 5 MG PO TB24: 5.0000 mg | ORAL_TABLET | Freq: Every day | ORAL | 3 refills | Status: DC

## 2019-08-04 MED ORDER — VITAMIN B-12 1000 MCG SL SUBL: 1.0000 | SUBLINGUAL_TABLET | Freq: Every day | SUBLINGUAL | 3 refills | Status: DC

## 2019-08-04 MED ORDER — AMLODIPINE BESY-BENAZEPRIL HCL 5-20 MG PO CAPS: ORAL_CAPSULE | ORAL | 3 refills | Status: DC

## 2019-08-04 MED ORDER — FINASTERIDE 5 MG PO TABS: 5.0000 mg | ORAL_TABLET | Freq: Every day | ORAL | 3 refills | Status: DC

## 2019-08-04 MED ORDER — TRAMADOL HCL 50 MG PO TABS: 50.0000 mg | ORAL_TABLET | Freq: Three times a day (TID) | ORAL | 1 refills | Status: DC | PRN

## 2019-08-04 MED ORDER — TAMSULOSIN HCL 0.4 MG PO CAPS: 0.4000 mg | ORAL_CAPSULE | Freq: Every day | ORAL | 3 refills | Status: DC

## 2019-08-04 MED ORDER — VITAMIN D3 50 MCG (2000 UT) PO CAPS: 2000.0000 [IU] | ORAL_CAPSULE | Freq: Every day | ORAL | 3 refills | Status: DC

## 2019-08-04 NOTE — Progress Notes (Signed)
Subjective:  Patient ID: Eric [name], male    DOB: 1941-02-10  Age: 78 y.o. MRN: [id number]  CC: No chief complaint on file.   HPI Eric [name] presents for HTN, BPH/OAB - worse, B12 def f/u  Outpatient Medications Prior to Visit  Medication Sig Dispense Refill   amLODipine-benazepril (LOTREL) 5-20 MG capsule TAKE 1 CAPSULE BY MOUTH EVERY DAY 90 capsule 1   aspirin 81 MG EC tablet TAKE 1 TABLET BY MOUTH EVERY DAY 90 tablet 1   Cholecalciferol (VITAMIN D3) 2000 units capsule Take 1 capsule (2,000 Units total) by mouth daily. 100 capsule 3   Cyanocobalamin (VITAMIN B-12) 1000 MCG SUBL Place 1 tablet (1,000 mcg total) under the tongue daily. 100 tablet 3   finasteride (PROSCAR) 5 MG tablet TAKE 1 TABLET BY MOUTH EVERY DAY 90 tablet 3   Naproxen Sod-diphenhydrAMINE (ALEVE PM PO) Take 1 tablet by mouth daily as needed.     pregabalin (LYRICA) 50 MG capsule Take 1 capsule (50 mg total) by mouth daily. 30 capsule 0   tamsulosin (FLOMAX) 0.4 MG CAPS capsule TAKE 1 CAPSULE BY MOUTH EVERY DAY 90 capsule 1   traMADol (ULTRAM) 50 MG tablet TAKE 1 TABLET (50 MG TOTAL) BY MOUTH 3 (THREE) TIMES DAILY AS NEEDED FOR SEVERE PAIN. 60 tablet 0   No facility-administered medications prior to visit.     ROS: Review of Systems  Constitutional: Positive for fatigue. Negative for appetite change and unexpected weight change.  HENT: Negative for congestion, nosebleeds, sneezing, sore throat and trouble swallowing.   Eyes: Negative for itching and visual disturbance.  Respiratory: Negative for cough.   Cardiovascular: Negative for chest pain, palpitations and leg swelling.  Gastrointestinal: Negative for abdominal distention, blood in stool, diarrhea and nausea.  Genitourinary: Positive for frequency and urgency. Negative for decreased urine volume, difficulty urinating, hematuria and scrotal swelling.  Musculoskeletal: Negative for arthralgias, back pain, gait problem, joint swelling and  neck pain.  Skin: Negative for rash.  Neurological: Negative for dizziness, tremors, speech difficulty and weakness.  Psychiatric/Behavioral: Negative for agitation, dysphoric mood and sleep disturbance. The patient is not nervous/anxious.     Objective:  BP (!) 162/78 (BP Location: Right Arm, Patient Position: Sitting, Cuff Size: Normal)    Pulse 74    Temp 98.5 F (36.9 C) (Oral)    Ht 5\' 7"  (1.702 m)    Wt 159 lb (72.1 kg)    SpO2 97%    BMI 24.90 kg/m   BP Readings from Last 3 Encounters:  08/04/19 (!) 162/78  04/29/19 (!) 155/85  10/26/18 132/80    Wt Readings from Last 3 Encounters:  08/04/19 159 lb (72.1 kg)  04/29/19 162 lb 12.8 oz (73.8 kg)  10/26/18 162 lb (73.5 kg)    Physical Exam Constitutional:      General: He is not in acute distress.    Appearance: He is well-developed.     Comments: NAD  Eyes:     Conjunctiva/sclera: Conjunctivae normal.     Pupils: Pupils are equal, round, and reactive to light.  Neck:     Musculoskeletal: Normal range of motion.     Thyroid: No thyromegaly.     Vascular: No JVD.  Cardiovascular:     Rate and Rhythm: Normal rate and regular rhythm.     Heart sounds: Normal heart sounds. No murmur. No friction rub. No gallop.   Pulmonary:     Effort: Pulmonary effort is normal. No respiratory distress.  Breath sounds: Normal breath sounds. No wheezing or rales.  Chest:     Chest wall: No tenderness.  Abdominal:     General: Bowel sounds are normal. There is no distension.     Palpations: Abdomen is soft. There is no mass.     Tenderness: There is no abdominal tenderness. There is no guarding or rebound.  Musculoskeletal: Normal range of motion.        General: No tenderness.  Lymphadenopathy:     Cervical: No cervical adenopathy.  Skin:    General: Skin is warm and dry.     Findings: No rash.  Neurological:     Mental Status: He is alert and oriented to person, place, and time.     Cranial Nerves: No cranial nerve deficit.       Motor: No abnormal muscle tone.     Coordination: Coordination normal.     Gait: Gait normal.     Deep Tendon Reflexes: Reflexes are normal and symmetric.  Psychiatric:        Behavior: Behavior normal.        Thought Content: Thought content normal.        Judgment: Judgment normal.   a/o/c  Lab Results  Component Value Date   WBC 8.4 07/30/2017   HGB 11.9 (L) 07/30/2017   HCT 37.1 (L) 07/30/2017   PLT 365.0 07/30/2017   GLUCOSE 94 07/22/2018   CHOL 173 07/30/2017   TRIG 104.0 07/30/2017   HDL 48.90 07/30/2017   LDLDIRECT 163.3 05/26/2008   LDLCALC 104 (H) 07/30/2017   ALT 11 07/30/2017   AST 16 07/30/2017   NA 138 07/22/2018   K 4.1 07/22/2018   CL 101 07/22/2018   CREATININE 0.97 07/22/2018   BUN 10 07/22/2018   CO2 30 07/22/2018   TSH 1.63 07/30/2017   PSA 0.61 07/22/2018   INR 1.12 07/20/2013    Dg Lumbar Spine Complete  Result Date: 10/26/2017 CLINICAL DATA:  Low back pain.  No reported injury. EXAM: LUMBAR SPINE - COMPLETE 4+ VIEW COMPARISON:  None. FINDINGS: This report assumes 5 non rib-bearing lumbar vertebrae. Mild dextrocurvature of the lumbar spine. Lumbar vertebral body heights are preserved, with no fracture. Marked multilevel lumbar degenerative disc disease, most prominent at L2-3, L4-5 and L5-S1. Mild 3 mm retrolisthesis at L2-3 and L3-4. Bilateral lower lumbar facet arthropathy. No aggressive appearing focal osseous lesions. Abdominal aortic atherosclerosis. 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 Mild multilevel degenerative upper lumbar spondylolisthesis. Electronically Signed   By: Delbert Phenix M.D.   On: 10/26/2017 12:00    Assessment & Plan:   There are no diagnoses linked to this encounter.   No orders of the defined types were placed in this encounter.    Follow-up: No follow-ups on file.  Sonda Primes, MD

## 2019-08-04 NOTE — Assessment & Plan Note (Signed)
[facility]

## 2019-08-04 NOTE — Assessment & Plan Note (Signed)
Proscar Flomax  Will try Ditropan. Pt refused Urology ref  Potential benefits of a long term Ditropan use as well as potential risks were explained to the patient and were aknowledged.

## 2019-08-04 NOTE — Assessment & Plan Note (Signed)
Labs Risks associated with treatment noncompliance were discussed. Compliance was encouraged. Re-start vit D

## 2019-08-04 NOTE — Patient Instructions (Signed)
If you have medicare related insurance (such as traditional Medicare, Blue Cross Medicare, United HealthCare Medicare, or similar), Please make an appointment at the scheduling desk with Jill, the Wellness Health Coach, for your Wellness visit in this office, which is a benefit with your insurance.  

## 2019-08-04 NOTE — Assessment & Plan Note (Signed)
On Vit B12 Labs

## 2019-08-05 LAB — URINALYSIS
Bilirubin Urine: NEGATIVE
Hgb urine dipstick: NEGATIVE
Ketones, ur: NEGATIVE
Leukocytes,Ua: NEGATIVE
Nitrite: NEGATIVE
Specific Gravity, Urine: 1.015 (ref 1.000–1.030)
Total Protein, Urine: NEGATIVE
Urine Glucose: NEGATIVE
Urobilinogen, UA: 0.2 (ref 0.0–1.0)
pH: 8 (ref 5.0–8.0)

## 2019-08-05 LAB — VITAMIN D 25 HYDROXY (VIT D DEFICIENCY, FRACTURES): VITD: 32.71 ng/mL (ref 30.00–100.00)

## 2019-09-08 ENCOUNTER — Ambulatory Visit: Payer: Medicare Other | Attending: Internal Medicine

## 2019-09-08 DIAGNOSIS — Z20822 Contact with and (suspected) exposure to covid-19: Secondary | ICD-10-CM

## 2019-09-09 ENCOUNTER — Ambulatory Visit: Payer: Medicare Other | Admitting: Internal Medicine

## 2019-09-10 ENCOUNTER — Telehealth: Payer: Self-pay | Admitting: Internal Medicine

## 2019-09-10 LAB — NOVEL CORONAVIRUS, NAA: SARS-CoV-2, NAA: NOT DETECTED

## 2019-09-10 NOTE — Telephone Encounter (Signed)
Negative COVID results given. Patient results "NOT Detected." Caller expressed understanding. ° °

## 2019-10-22 ENCOUNTER — Other Ambulatory Visit: Payer: Self-pay

## 2019-10-22 NOTE — Telephone Encounter (Signed)
Medication Requested: traMADol (ULTRAM) 50 MG tablet  Is medication on med list: Yes  (if no, inform pt they may need an appointment)  Is medication a controled: Yes  (yes = last OV with PCP)  -Controlled Substances: Adderall, Ritalin, oxycodone, hydrocodone, methadone, alprazolam, etc  Last visit with PCP: 08/04/19   Is the OV > than 4 months: (yes = schedule an appt if one is not already made)  Pharmacy (Name, [facility]): [facility] on [address]

## 2019-10-25 NOTE — Telephone Encounter (Signed)
Patient's daughter is calling to check the status of this refill order and I informed her of refill turn around time.

## 2019-10-26 MED ORDER — TRAMADOL HCL 50 MG PO TABS: 50.0000 mg | ORAL_TABLET | Freq: Three times a day (TID) | ORAL | 1 refills | Status: DC | PRN

## 2019-10-27 MED ORDER — TRAMADOL HCL 50 MG PO TABS: 50.0000 mg | ORAL_TABLET | Freq: Three times a day (TID) | ORAL | 1 refills | Status: DC | PRN

## 2019-10-27 NOTE — Addendum Note (Signed)
Addended by: Karren Cobble on: 10/27/2019 08:33 AM   Modules accepted: Orders

## 2019-11-02 ENCOUNTER — Encounter: Payer: Medicare Other | Admitting: Internal Medicine

## 2019-11-16 ENCOUNTER — Other Ambulatory Visit: Payer: Self-pay

## 2019-11-16 ENCOUNTER — Encounter: Payer: Self-pay | Admitting: Internal Medicine

## 2019-11-16 ENCOUNTER — Ambulatory Visit (INDEPENDENT_AMBULATORY_CARE_PROVIDER_SITE_OTHER): Payer: Medicare Other | Admitting: Internal Medicine

## 2019-11-16 VITALS — BP 146/82 | HR 100 | Temp 99.3°F | Ht 67.0 in | Wt 152.0 lb

## 2019-11-16 DIAGNOSIS — E559 Vitamin D deficiency, unspecified: Secondary | ICD-10-CM

## 2019-11-16 DIAGNOSIS — Z0000 Encounter for general adult medical examination without abnormal findings: Secondary | ICD-10-CM | POA: Diagnosis not present

## 2019-11-16 DIAGNOSIS — Z23 Encounter for immunization: Secondary | ICD-10-CM | POA: Diagnosis not present

## 2019-11-16 DIAGNOSIS — I10 Essential (primary) hypertension: Secondary | ICD-10-CM

## 2019-11-16 DIAGNOSIS — N401 Enlarged prostate with lower urinary tract symptoms: Secondary | ICD-10-CM

## 2019-11-16 DIAGNOSIS — E538 Deficiency of other specified B group vitamins: Secondary | ICD-10-CM

## 2019-11-16 DIAGNOSIS — E785 Hyperlipidemia, unspecified: Secondary | ICD-10-CM

## 2019-11-16 MED ORDER — TAMSULOSIN HCL 0.4 MG PO CAPS: 0.4000 mg | ORAL_CAPSULE | Freq: Every day | ORAL | 3 refills | Status: DC

## 2019-11-16 NOTE — Assessment & Plan Note (Signed)
Labs

## 2019-11-16 NOTE — Progress Notes (Signed)
Subjective:  Patient ID: Eric [name], male    DOB: [birth year]  Age: 79 y.o. MRN: [id number]  CC: No chief complaint on file.   HPI [name] presents for a well exam/MC exam BP nl at home   Outpatient Medications Prior to Visit  Medication Sig Dispense Refill  . amLODipine-benazepril (LOTREL) 5-20 MG capsule TAKE 1 CAPSULE BY MOUTH EVERY DAY 90 capsule 3  . aspirin 81 MG EC tablet TAKE 1 TABLET BY MOUTH EVERY DAY 90 tablet 1  . Cholecalciferol (VITAMIN D3) 50 MCG (2000 UT) capsule Take 1 capsule (2,000 Units total) by mouth daily. 100 capsule 3  . Cyanocobalamin (VITAMIN B-12) 1000 MCG SUBL Place 1 tablet (1,000 mcg total) under the tongue daily. 100 tablet 3  . finasteride (PROSCAR) 5 MG tablet Take 1 tablet (5 mg total) by mouth daily. 90 tablet 3  . oxybutynin (DITROPAN XL) 5 MG 24 hr tablet Take 1 tablet (5 mg total) by mouth at bedtime. For frequent urination 90 tablet 3  . tamsulosin (FLOMAX) 0.4 MG CAPS capsule Take 1 capsule (0.4 mg total) by mouth daily. 90 capsule 3  . traMADol (ULTRAM) 50 MG tablet Take 1 tablet (50 mg total) by mouth 3 (three) times daily as needed for severe pain. 100 tablet 1   No facility-administered medications prior to visit.    ROS: Review of Systems  Constitutional: Negative for appetite change, fatigue and unexpected weight change.  HENT: Negative for congestion, nosebleeds, sneezing, sore throat and trouble swallowing.   Eyes: Negative for itching and visual disturbance.  Respiratory: Negative for cough.   Cardiovascular: Negative for chest pain, palpitations and leg swelling.  Gastrointestinal: Negative for abdominal distention, blood in stool, diarrhea and nausea.  Genitourinary: Positive for frequency. Negative for hematuria.  Musculoskeletal: Negative for back pain, gait problem, joint swelling and neck pain.  Skin: Negative for rash.  Neurological: Negative for dizziness, tremors, speech difficulty and weakness.    Psychiatric/Behavioral: Negative for agitation, dysphoric mood and sleep disturbance. The patient is not nervous/anxious.     Objective:  BP (!) 146/82 (BP Location: Left Arm, Patient Position: Sitting, Cuff Size: Normal)   Pulse 100   Temp 99.3 F (37.4 C) (Oral)   Ht 5\' 7"  (1.702 m)   Wt 152 lb (68.9 kg)   BMI 23.81 kg/m   BP Readings from Last 3 Encounters:  11/16/19 (!) 146/82  08/04/19 (!) 162/78  04/29/19 (!) 155/85    Wt Readings from Last 3 Encounters:  11/16/19 152 lb (68.9 kg)  08/04/19 159 lb (72.1 kg)  04/29/19 162 lb 12.8 oz (73.8 kg)    Physical Exam Constitutional:      General: He is not in acute distress.    Appearance: He is well-developed.     Comments: NAD  Eyes:     Conjunctiva/sclera: Conjunctivae normal.     Pupils: Pupils are equal, round, and reactive to light.  Neck:     Thyroid: No thyromegaly.     Vascular: No JVD.  Cardiovascular:     Rate and Rhythm: Normal rate and regular rhythm.     Heart sounds: Normal heart sounds. No murmur. No friction rub. No gallop.   Pulmonary:     Effort: Pulmonary effort is normal. No respiratory distress.     Breath sounds: Normal breath sounds. No wheezing or rales.  Chest:     Chest wall: No tenderness.  Abdominal:     General: Bowel sounds are normal. There  is no distension.     Palpations: Abdomen is soft. There is no mass.     Tenderness: There is no abdominal tenderness. There is no guarding or rebound.  Musculoskeletal:        General: No tenderness. Normal range of motion.     Cervical back: Normal range of motion.  Lymphadenopathy:     Cervical: No cervical adenopathy.  Skin:    General: Skin is warm and dry.     Findings: No rash.  Neurological:     Mental Status: He is alert and oriented to person, place, and time.     Cranial Nerves: No cranial nerve deficit.     Motor: No abnormal muscle tone.     Coordination: Coordination normal.     Gait: Gait normal.     Deep Tendon Reflexes:  Reflexes are normal and symmetric.  Psychiatric:        Behavior: Behavior normal.        Thought Content: Thought content normal.        Judgment: Judgment normal.   Rectal declined  Lab Results  Component Value Date   WBC 7.4 08/04/2019   HGB 10.3 (L) 08/04/2019   HCT 32.6 (L) 08/04/2019   PLT 414.0 (H) 08/04/2019   GLUCOSE 106 (H) 08/04/2019   CHOL 158 08/04/2019   TRIG 80.0 08/04/2019   HDL 52.50 08/04/2019   LDLDIRECT 163.3 05/26/2008   LDLCALC 90 08/04/2019   ALT 10 08/04/2019   AST 13 08/04/2019   NA 138 08/04/2019   K 3.9 08/04/2019   CL 104 08/04/2019   CREATININE 0.91 08/04/2019   BUN 9 08/04/2019   CO2 28 08/04/2019   TSH 1.08 08/04/2019   PSA 0.13 08/04/2019   INR 1.12 07/20/2013    DG Lumbar Spine Complete  Result Date: 10/26/2017 CLINICAL DATA:  Low back pain.  No reported injury. EXAM: LUMBAR SPINE - COMPLETE 4+ VIEW COMPARISON:  None. FINDINGS: This report assumes 5 non rib-bearing lumbar vertebrae. Mild dextrocurvature of the lumbar spine. Lumbar vertebral body heights are preserved, with no fracture. Marked multilevel lumbar degenerative disc disease, most prominent at L2-3, L4-5 and L5-S1. Mild 3 mm retrolisthesis at L2-3 and L3-4. Bilateral lower lumbar facet arthropathy. No aggressive appearing focal osseous lesions. Abdominal aortic atherosclerosis. 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 Mild multilevel degenerative upper lumbar spondylolisthesis. Electronically Signed   By: Delbert Phenix M.D.   On: 10/26/2017 12:00    Assessment & Plan:    Sonda Primes, MD

## 2019-11-16 NOTE — Assessment & Plan Note (Signed)
On B12 Labs 

## 2019-11-16 NOTE — Assessment & Plan Note (Signed)
Lotrel BP nl at home

## 2019-11-16 NOTE — Assessment & Plan Note (Signed)
Vit D 

## 2019-11-16 NOTE — Assessment & Plan Note (Signed)
Here for medicare wellness/physical  Diet: heart healthy  Physical activity: not sedentary  Depression/mood screen: negative  Hearing: intact to whispered voice  Visual acuity: grossly normal/reading glasses, performs annual eye exam  ADLs: capable  Fall risk: low  Home safety: good  Cognitive evaluation: intact to orientation, naming, recall and repetition  EOL planning: adv directives, full code/ I agree  I have personally reviewed and have noted  1. The patient's medical, surgical and social history  2. Their use of alcohol, tobacco or illicit drugs  3. Their current medications and supplements  4. The patient's functional ability including ADL's, fall risks, home safety risks and hearing or visual impairment.  5. Diet and physical activities  6. Evidence for depression or mood disorders 7. The roster of all physicians providing medical care to patient - is listed in the Snapshot section of the chart and reviewed today.    Today patient counseled on age appropriate routine health concerns for screening and prevention, each reviewed and up to date or declined. Immunizations reviewed and up to date or declined. Labs ordered and reviewed. Risk factors for depression reviewed and negative. Hearing function and visual acuity are intact. ADLs screened and addressed as needed. Functional ability and level of safety reviewed and appropriate. Education, counseling and referrals performed based on assessed risks today. Patient provided with a copy of personalized plan for preventive services.    We discussed age appropriate health related issues, including available/recomended screening tests and vaccinations. Labs were ordered to be later reviewed . All questions were answered. We discussed one or more of the following - seat belt use, use of sunscreen/sun exposure exercise, safe sex, fall risk reduction, second hand smoke exposure, firearm use and storage, seat belt use, a need for adhering to  healthy diet and exercise. Labs were ordered. All questions were answered. Refused COVID 19 vaccination

## 2019-11-22 ENCOUNTER — Ambulatory Visit: Payer: Self-pay

## 2020-01-27 ENCOUNTER — Other Ambulatory Visit: Payer: Self-pay | Admitting: Internal Medicine

## 2020-01-27 MED ORDER — AMLODIPINE BESY-BENAZEPRIL HCL 5-20 MG PO CAPS: ORAL_CAPSULE | ORAL | 2 refills | Status: DC

## 2020-01-27 NOTE — Telephone Encounter (Signed)
Reviewed chart pt is up-to-date sent refills to [facility].../[facility]  

## 2020-01-27 NOTE — Telephone Encounter (Signed)
    1.Medication Requested:amLODipine-benazepril (LOTREL) 5-20 MG capsule  2. Pharmacy (Name, Street, City):[id number] - [place], [place].  3. On Med List: yes  4. Last Visit with PCP: 11/16/19  5. Next visit date with PCP:   Agent: Please be advised that RX refills may take up to 3 business days. We ask that you follow-up with your pharmacy.

## 2020-02-02 ENCOUNTER — Other Ambulatory Visit: Payer: Self-pay | Admitting: Internal Medicine

## 2020-02-03 NOTE — Telephone Encounter (Signed)
1.Medication Requested:traMADol (ULTRAM) 50 MG tablet   2. Pharmacy (Name, Street, City):[id number] - [place], [address]  3. On Med List: Yes   4. Last Visit with PCP: 3.16.21  5. Next visit date with PCP: n/a   Agent: Please be advised that RX refills may take up to 3 business days. We ask that you follow-up with your pharmacy.

## 2020-02-03 NOTE — Telephone Encounter (Signed)
[place] Controlled Database Checked Last filled: 11/28/19  # 90 LOV w/you:  11/16/19  Next appt w/you:  None

## 2020-02-17 ENCOUNTER — Other Ambulatory Visit: Payer: Self-pay | Admitting: Internal Medicine

## 2020-02-17 NOTE — Telephone Encounter (Signed)
Medication not on current list. Ok to refill?

## 2020-05-11 ENCOUNTER — Other Ambulatory Visit: Payer: Self-pay | Admitting: Internal Medicine

## 2020-05-11 NOTE — Telephone Encounter (Signed)
    1.Medication Requested: traMADol (ULTRAM) 50 MG tablet  2. Pharmacy (Name, Street, City):[id number] [place], [place].  3. On Med List: yes  4. Last Visit with PCP: 11/16/19  5. Next visit date with PCP: 05/22/20   Agent: Please be advised that RX refills may take up to 3 business days. We ask that you follow-up with your pharmacy.

## 2020-05-12 ENCOUNTER — Other Ambulatory Visit: Payer: Self-pay | Admitting: Internal Medicine

## 2020-05-12 NOTE — Telephone Encounter (Signed)
[place] Controlled Database Checked Last filled: 04/13/20 (90) LOV w/you: 11/16/19 Next appt w/you: 05/22/20

## 2020-05-16 MED ORDER — TRAMADOL HCL 50 MG PO TABS: ORAL_TABLET | ORAL | 0 refills | Status: DC

## 2020-05-22 ENCOUNTER — Other Ambulatory Visit: Payer: Self-pay

## 2020-05-22 ENCOUNTER — Encounter: Payer: Self-pay | Admitting: Internal Medicine

## 2020-05-22 ENCOUNTER — Ambulatory Visit (INDEPENDENT_AMBULATORY_CARE_PROVIDER_SITE_OTHER): Payer: Medicare Other | Admitting: Internal Medicine

## 2020-05-22 ENCOUNTER — Ambulatory Visit: Payer: Medicare Other

## 2020-05-22 VITALS — BP 162/88 | HR 78 | Temp 98.7°F | Ht 67.0 in | Wt 143.0 lb

## 2020-05-22 DIAGNOSIS — Z23 Encounter for immunization: Secondary | ICD-10-CM | POA: Diagnosis not present

## 2020-05-22 DIAGNOSIS — E559 Vitamin D deficiency, unspecified: Secondary | ICD-10-CM | POA: Diagnosis not present

## 2020-05-22 DIAGNOSIS — M19011 Primary osteoarthritis, right shoulder: Secondary | ICD-10-CM

## 2020-05-22 DIAGNOSIS — I10 Essential (primary) hypertension: Secondary | ICD-10-CM

## 2020-05-22 DIAGNOSIS — E538 Deficiency of other specified B group vitamins: Secondary | ICD-10-CM | POA: Diagnosis not present

## 2020-05-22 MED ORDER — TRAMADOL HCL 50 MG PO TABS: ORAL_TABLET | ORAL | 3 refills | Status: DC

## 2020-05-22 NOTE — Assessment & Plan Note (Signed)
Vit D 

## 2020-05-22 NOTE — Assessment & Plan Note (Signed)
  BP nl at home 

## 2020-05-22 NOTE — Assessment & Plan Note (Signed)
On B12 

## 2020-05-22 NOTE — Assessment & Plan Note (Signed)
Tramadol prn ° Potential benefits of a long term opioids use as well as potential risks (i.e. addiction risk, apnea etc) and complications (i.e. Somnolence, constipation and others) were explained to the patient and were aknowledged. ° ° °

## 2020-05-22 NOTE — Progress Notes (Signed)
Subjective:  Patient ID: Eric [name], male    DOB: 1940/11/02  Age: 79 y.o. MRN: [id number]  CC: No chief complaint on file.   HPI Eric [name] presents for OA, HTN, BPH f/u BP nl at home Lost wt - feeling good  Outpatient Medications Prior to Visit  Medication Sig Dispense Refill  . amLODipine-benazepril (LOTREL) 5-20 MG capsule TAKE 1 CAPSULE BY MOUTH EVERY DAY 90 capsule 2  . aspirin 81 MG EC tablet TAKE 1 TABLET BY MOUTH EVERY DAY 90 tablet 1  . Cholecalciferol (VITAMIN D3) 50 MCG (2000 UT) capsule Take 1 capsule (2,000 Units total) by mouth daily. 100 capsule 3  . Cyanocobalamin (VITAMIN B-12) 1000 MCG SUBL Place 1 tablet (1,000 mcg total) under the tongue daily. 100 tablet 3  . finasteride (PROSCAR) 5 MG tablet Take 1 tablet (5 mg total) by mouth daily. 90 tablet 3  . gabapentin (NEURONTIN) 300 MG capsule TAKE 1 CAP TWO TIMES DAILY FOR 2 WEEKS THEN INCREASE TO 3 TIMES DAILY 270 capsule 3  . oxybutynin (DITROPAN XL) 5 MG 24 hr tablet Take 1 tablet (5 mg total) by mouth at bedtime. For frequent urination 90 tablet 3  . tamsulosin (FLOMAX) 0.4 MG CAPS capsule Take 1 capsule (0.4 mg total) by mouth daily. 90 capsule 3  . traMADol (ULTRAM) 50 MG tablet TAKE 1 TABLET BY MOUTH THREE TIMES A DAY AS NEEDED SEVERE PAIN 90 tablet 0   No facility-administered medications prior to visit.    ROS: Review of Systems  Constitutional: Positive for unexpected weight change. Negative for appetite change and fatigue.  HENT: Negative for congestion, nosebleeds, sneezing, sore throat and trouble swallowing.   Eyes: Negative for itching and visual disturbance.  Respiratory: Negative for cough.   Cardiovascular: Negative for chest pain, palpitations and leg swelling.  Gastrointestinal: Negative for abdominal distention, blood in stool, diarrhea and nausea.  Genitourinary: Negative for frequency and hematuria.  Musculoskeletal: Positive for arthralgias and back pain. Negative for gait  problem, joint swelling and neck pain.  Skin: Negative for rash.  Neurological: Negative for dizziness, tremors, speech difficulty and weakness.  Psychiatric/Behavioral: Negative for agitation, dysphoric mood and sleep disturbance. The patient is not nervous/anxious.     Objective:  BP (!) 162/88 (BP Location: Left Arm, Patient Position: Sitting, Cuff Size: Normal)   Pulse 78   Temp 98.7 F (37.1 C) (Oral)   Ht 5\' 7"  (1.702 m)   Wt 143 lb (64.9 kg)   SpO2 96%   BMI 22.40 kg/m   BP Readings from Last 3 Encounters:  05/22/20 (!) 162/88  11/16/19 (!) 146/82  08/04/19 (!) 162/78    Wt Readings from Last 3 Encounters:  05/22/20 143 lb (64.9 kg)  11/16/19 152 lb (68.9 kg)  08/04/19 159 lb (72.1 kg)    Physical Exam Constitutional:      General: He is not in acute distress.    Appearance: He is well-developed.     Comments: NAD  Eyes:     Conjunctiva/sclera: Conjunctivae normal.     Pupils: Pupils are equal, round, and reactive to light.  Neck:     Thyroid: No thyromegaly.     Vascular: No JVD.  Cardiovascular:     Rate and Rhythm: Normal rate and regular rhythm.     Heart sounds: Normal heart sounds. No murmur heard.  No friction rub. No gallop.   Pulmonary:     Effort: Pulmonary effort is normal. No respiratory distress.  Breath sounds: Normal breath sounds. No wheezing or rales.  Chest:     Chest wall: No tenderness.  Abdominal:     General: Bowel sounds are normal. There is no distension.     Palpations: Abdomen is soft. There is no mass.     Tenderness: There is no abdominal tenderness. There is no guarding or rebound.  Musculoskeletal:        General: No tenderness. Normal range of motion.     Cervical back: Normal range of motion.  Lymphadenopathy:     Cervical: No cervical adenopathy.  Skin:    General: Skin is warm and dry.     Findings: No rash.  Neurological:     Mental Status: He is alert and oriented to person, place, and time.     Cranial  Nerves: No cranial nerve deficit.     Motor: No abnormal muscle tone.     Coordination: Coordination normal.     Gait: Gait normal.     Deep Tendon Reflexes: Reflexes are normal and symmetric.  Psychiatric:        Behavior: Behavior normal.        Thought Content: Thought content normal.        Judgment: Judgment normal.     Lab Results  Component Value Date   WBC 7.4 08/04/2019   HGB 10.3 (L) 08/04/2019   HCT 32.6 (L) 08/04/2019   PLT 414.0 (H) 08/04/2019   GLUCOSE 106 (H) 08/04/2019   CHOL 158 08/04/2019   TRIG 80.0 08/04/2019   HDL 52.50 08/04/2019   LDLDIRECT 163.3 05/26/2008   LDLCALC 90 08/04/2019   ALT 10 08/04/2019   AST 13 08/04/2019   NA 138 08/04/2019   K 3.9 08/04/2019   CL 104 08/04/2019   CREATININE 0.91 08/04/2019   BUN 9 08/04/2019   CO2 28 08/04/2019   TSH 1.08 08/04/2019   PSA 0.13 08/04/2019   INR 1.12 07/20/2013    DG Lumbar Spine Complete  Result Date: 10/26/2017 CLINICAL DATA:  Low back pain.  No reported injury. EXAM: LUMBAR SPINE - COMPLETE 4+ VIEW COMPARISON:  None. FINDINGS: This report assumes 5 non rib-bearing lumbar vertebrae. Mild dextrocurvature of the lumbar spine. Lumbar vertebral body heights are preserved, with no fracture. Marked multilevel lumbar degenerative disc disease, most prominent at L2-3, L4-5 and L5-S1. Mild 3 mm retrolisthesis at L2-3 and L3-4. Bilateral lower lumbar facet arthropathy. No aggressive appearing focal osseous lesions. Abdominal aortic atherosclerosis. 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 Mild multilevel degenerative upper lumbar spondylolisthesis. Electronically Signed   By: Delbert Phenix M.D.   On: 10/26/2017 12:00    Assessment & Plan:   There are no diagnoses linked to this encounter.   No orders of the defined types were placed in this encounter.    Follow-up: No follow-ups on file.  Sonda Primes, MD

## 2020-07-26 ENCOUNTER — Other Ambulatory Visit: Payer: Self-pay | Admitting: Internal Medicine

## 2020-08-21 ENCOUNTER — Encounter: Payer: Self-pay | Admitting: Internal Medicine

## 2020-08-21 ENCOUNTER — Ambulatory Visit: Payer: Medicare Other

## 2020-08-21 ENCOUNTER — Other Ambulatory Visit: Payer: Self-pay

## 2020-08-21 ENCOUNTER — Ambulatory Visit (INDEPENDENT_AMBULATORY_CARE_PROVIDER_SITE_OTHER): Payer: Medicare Other | Admitting: Internal Medicine

## 2020-08-21 DIAGNOSIS — R351 Nocturia: Secondary | ICD-10-CM | POA: Diagnosis not present

## 2020-08-21 DIAGNOSIS — N401 Enlarged prostate with lower urinary tract symptoms: Secondary | ICD-10-CM | POA: Diagnosis not present

## 2020-08-21 DIAGNOSIS — I10 Essential (primary) hypertension: Secondary | ICD-10-CM | POA: Diagnosis not present

## 2020-08-21 DIAGNOSIS — E785 Hyperlipidemia, unspecified: Secondary | ICD-10-CM

## 2020-08-21 DIAGNOSIS — E538 Deficiency of other specified B group vitamins: Secondary | ICD-10-CM | POA: Diagnosis not present

## 2020-08-21 DIAGNOSIS — E559 Vitamin D deficiency, unspecified: Secondary | ICD-10-CM | POA: Diagnosis not present

## 2020-08-21 DIAGNOSIS — Z0000 Encounter for general adult medical examination without abnormal findings: Secondary | ICD-10-CM | POA: Diagnosis not present

## 2020-08-21 LAB — BASIC METABOLIC PANEL
BUN: 9 mg/dL (ref 6–23)
CO2: 30 mEq/L (ref 19–32)
Calcium: 9.9 mg/dL (ref 8.4–10.5)
Chloride: 104 mEq/L (ref 96–112)
Creatinine, Ser: 0.87 mg/dL (ref 0.40–1.50)
GFR: 81.94 mL/min (ref >60.00)
Glucose, Bld: 103 mg/dL — ABNORMAL HIGH (ref 70–99)
Potassium: 4.3 mEq/L (ref 3.5–5.1)
Sodium: 140 mEq/L (ref 135–145)

## 2020-08-21 LAB — HEPATIC FUNCTION PANEL
ALT: 9 U/L (ref 0–53)
AST: 12 U/L (ref 0–37)
Albumin: 4.2 g/dL (ref 3.5–5.2)
Alkaline Phosphatase: 63 U/L (ref 39–117)
Bilirubin, Direct: 0.1 mg/dL (ref 0.0–0.3)
Total Bilirubin: 0.7 mg/dL (ref 0.2–1.2)
Total Protein: 7.4 g/dL (ref 6.0–8.3)

## 2020-08-21 LAB — URINALYSIS, ROUTINE W REFLEX MICROSCOPIC
Bilirubin Urine: NEGATIVE
Ketones, ur: NEGATIVE
Nitrite: NEGATIVE
Specific Gravity, Urine: 1.02 (ref 1.000–1.030)
Total Protein, Urine: NEGATIVE
Urine Glucose: NEGATIVE
Urobilinogen, UA: 1 (ref 0.0–1.0)
pH: 7 (ref 5.0–8.0)

## 2020-08-21 LAB — VITAMIN D 25 HYDROXY (VIT D DEFICIENCY, FRACTURES): VITD: 64.1 ng/mL (ref 30.00–100.00)

## 2020-08-21 LAB — CBC WITH DIFFERENTIAL/PLATELET
Basophils Absolute: 0 10*3/uL (ref 0.0–0.1)
Basophils Relative: 0.5 % (ref 0.0–3.0)
Eosinophils Absolute: 0 10*3/uL (ref 0.0–0.7)
Eosinophils Relative: 0.3 % (ref 0.0–5.0)
HCT: 32.7 % — ABNORMAL LOW (ref 39.0–52.0)
Hemoglobin: 10.5 g/dL — ABNORMAL LOW (ref 13.0–17.0)
Lymphocytes Relative: 20 % (ref 12.0–46.0)
Lymphs Abs: 1.7 10*3/uL (ref 0.7–4.0)
MCHC: 32 g/dL (ref 30.0–36.0)
MCV: 70.5 fl — ABNORMAL LOW (ref 78.0–100.0)
Monocytes Absolute: 0.6 10*3/uL (ref 0.1–1.0)
Monocytes Relative: 7.3 % (ref 3.0–12.0)
Neutro Abs: 6.1 10*3/uL (ref 1.4–7.7)
Neutrophils Relative %: 71.9 % (ref 43.0–77.0)
Platelets: 432 10*3/uL — ABNORMAL HIGH (ref 150.0–400.0)
RBC: 4.64 Mil/uL (ref 4.22–5.81)
RDW: 17.9 % — ABNORMAL HIGH (ref 11.5–15.5)
WBC: 8.5 10*3/uL (ref 4.0–10.5)

## 2020-08-21 LAB — VITAMIN B12: Vitamin B-12: 771 pg/mL (ref 211–911)

## 2020-08-21 LAB — PSA: PSA: 0.14 ng/mL (ref 0.10–4.00)

## 2020-08-21 LAB — LIPID PANEL
Cholesterol: 189 mg/dL (ref 0–200)
HDL: 70.7 mg/dL (ref >39.00)
LDL Cholesterol: 104 mg/dL — ABNORMAL HIGH (ref 0–99)
NonHDL: 118.49
Total CHOL/HDL Ratio: 3
Triglycerides: 74 mg/dL (ref 0.0–149.0)
VLDL: 14.8 mg/dL (ref 0.0–40.0)

## 2020-08-21 LAB — TSH: TSH: 0.9 u[IU]/mL (ref 0.35–4.50)

## 2020-08-21 MED ORDER — AMLODIPINE BESY-BENAZEPRIL HCL 5-20 MG PO CAPS: ORAL_CAPSULE | ORAL | 3 refills | Status: DC

## 2020-08-21 NOTE — Assessment & Plan Note (Signed)
Worse Re-start Amlod- Benazepril 1 qd

## 2020-08-21 NOTE — Assessment & Plan Note (Signed)
Cont w/Vit D3 2000 iu/d

## 2020-08-21 NOTE — Progress Notes (Signed)
Subjective:  Patient ID: Eric [name], male    DOB: 1941/03/30  Age: 79 y.o. MRN: [id number]  CC: Follow-up (3 month f/u- want to discuss changing his Tamulosin)   HPI TUFF MATURO presents for BPH - worse, HTN, B12 def f/u. He ran out of BP meds 1 week ago  Outpatient Medications Prior to Visit  Medication Sig Dispense Refill  . amLODipine-benazepril (LOTREL) 5-20 MG capsule TAKE 1 CAPSULE BY MOUTH EVERY DAY 90 capsule 2  . aspirin 81 MG EC tablet TAKE 1 TABLET BY MOUTH EVERY DAY 90 tablet 1  . [facility] D3 50 MCG (2000 UT) CAPS TAKE 1 CAPSULE BY MOUTH EVERY DAY 100 capsule 3  . Cyanocobalamin (VITAMIN B-12) 1000 MCG SUBL Place 1 tablet (1,000 mcg total) under the tongue daily. 100 tablet 3  . finasteride (PROSCAR) 5 MG tablet Take 1 tablet (5 mg total) by mouth daily. 90 tablet 3  . gabapentin (NEURONTIN) 300 MG capsule TAKE 1 CAP TWO TIMES DAILY FOR 2 WEEKS THEN INCREASE TO 3 TIMES DAILY 270 capsule 3  . oxybutynin (DITROPAN XL) 5 MG 24 hr tablet Take 1 tablet (5 mg total) by mouth at bedtime. For frequent urination 90 tablet 3  . tamsulosin (FLOMAX) 0.4 MG CAPS capsule Take 1 capsule (0.4 mg total) by mouth daily. 90 capsule 3  . traMADol (ULTRAM) 50 MG tablet TAKE 1 TABLET BY MOUTH THREE TIMES A DAY AS NEEDED SEVERE PAIN 90 tablet 3   No facility-administered medications prior to visit.    ROS: Review of Systems  Constitutional: Negative for appetite change, fatigue and unexpected weight change.  HENT: Negative for congestion, nosebleeds, sneezing, sore throat and trouble swallowing.   Eyes: Negative for itching and visual disturbance.  Respiratory: Negative for cough.   Cardiovascular: Negative for chest pain, palpitations and leg swelling.  Gastrointestinal: Negative for abdominal distention, blood in stool, diarrhea and nausea.  Genitourinary: Positive for difficulty urinating, frequency and urgency. Negative for hematuria.  Musculoskeletal: Negative for back pain, gait  problem, joint swelling and neck pain.  Skin: Negative for rash.  Neurological: Negative for dizziness, tremors, speech difficulty and weakness.  Psychiatric/Behavioral: Negative for agitation, dysphoric mood and sleep disturbance. The patient is not nervous/anxious.     Objective:  BP (!) 162/82 (BP Location: Left Arm)   Pulse 82   Temp 99 F (37.2 C) (Oral)   Wt 148 lb 9.6 oz (67.4 kg)   SpO2 93%   BMI 23.27 kg/m   BP Readings from Last 3 Encounters:  08/21/20 (!) 162/82  05/22/20 (!) 162/88  11/16/19 (!) 146/82    Wt Readings from Last 3 Encounters:  08/21/20 148 lb 9.6 oz (67.4 kg)  05/22/20 143 lb (64.9 kg)  11/16/19 152 lb (68.9 kg)    Physical Exam Constitutional:      General: He is not in acute distress.    Appearance: He is well-developed.     Comments: NAD  HENT:     Mouth/Throat:     Mouth: Oropharynx is clear and moist.  Eyes:     Conjunctiva/sclera: Conjunctivae normal.     Pupils: Pupils are equal, round, and reactive to light.  Neck:     Thyroid: No thyromegaly.     Vascular: No JVD.  Cardiovascular:     Rate and Rhythm: Normal rate and regular rhythm.     Pulses: Intact distal pulses.     Heart sounds: Normal heart sounds. No murmur heard. No friction rub.  No gallop.   Pulmonary:     Effort: Pulmonary effort is normal. No respiratory distress.     Breath sounds: Normal breath sounds. No wheezing or rales.  Chest:     Chest wall: No tenderness.  Abdominal:     General: Bowel sounds are normal. There is no distension.     Palpations: Abdomen is soft. There is no mass.     Tenderness: There is no abdominal tenderness. There is no guarding or rebound.  Musculoskeletal:        General: No tenderness or edema. Normal range of motion.     Cervical back: Normal range of motion.  Lymphadenopathy:     Cervical: No cervical adenopathy.  Skin:    General: Skin is warm and dry.     Findings: No rash.  Neurological:     Mental Status: He is alert  and oriented to person, place, and time.     Cranial Nerves: No cranial nerve deficit.     Motor: No abnormal muscle tone.     Coordination: He displays a negative Romberg sign. Coordination normal.     Gait: Gait normal.     Deep Tendon Reflexes: Reflexes are normal and symmetric.  Psychiatric:        Mood and Affect: Mood and affect normal.        Behavior: Behavior normal.        Thought Content: Thought content normal.        Judgment: Judgment normal.     Lab Results  Component Value Date   WBC 7.4 08/04/2019   HGB 10.3 (L) 08/04/2019   HCT 32.6 (L) 08/04/2019   PLT 414.0 (H) 08/04/2019   GLUCOSE 106 (H) 08/04/2019   CHOL 158 08/04/2019   TRIG 80.0 08/04/2019   HDL 52.50 08/04/2019   LDLDIRECT 163.3 05/26/2008   LDLCALC 90 08/04/2019   ALT 10 08/04/2019   AST 13 08/04/2019   NA 138 08/04/2019   K 3.9 08/04/2019   CL 104 08/04/2019   CREATININE 0.91 08/04/2019   BUN 9 08/04/2019   CO2 28 08/04/2019   TSH 1.08 08/04/2019   PSA 0.13 08/04/2019   INR 1.12 07/20/2013    DG Lumbar Spine Complete  Result Date: 10/26/2017 CLINICAL DATA:  Low back pain.  No reported injury. EXAM: LUMBAR SPINE - COMPLETE 4+ VIEW COMPARISON:  None. FINDINGS: This report assumes 5 non rib-bearing lumbar vertebrae. Mild dextrocurvature of the lumbar spine. Lumbar vertebral body heights are preserved, with no fracture. Marked multilevel lumbar degenerative disc disease, most prominent at L2-3, L4-5 and L5-S1. Mild 3 mm retrolisthesis at L2-3 and L3-4. Bilateral lower lumbar facet arthropathy. No aggressive appearing focal osseous lesions. Abdominal aortic atherosclerosis. 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 Mild multilevel degenerative upper lumbar spondylolisthesis. Electronically Signed   By: Delbert Phenix M.D.   On: 10/26/2017 12:00    Assessment & Plan:    Sonda Primes, MD

## 2020-08-21 NOTE — Assessment & Plan Note (Signed)
Worse Cont Finasteride and Proscar po He refused URology ref, med changes

## 2020-08-21 NOTE — Addendum Note (Signed)
Addended by: Boris Lown B on: 08/21/2020 03:54 PM   Modules accepted: Orders

## 2020-08-21 NOTE — Assessment & Plan Note (Signed)
Cont w/Vit B12 1000 mcg/d

## 2020-08-22 ENCOUNTER — Other Ambulatory Visit: Payer: Self-pay | Admitting: Internal Medicine

## 2020-08-22 MED ORDER — CIPROFLOXACIN HCL 500 MG PO TABS: 500.0000 mg | ORAL_TABLET | Freq: Two times a day (BID) | ORAL | 0 refills | Status: DC

## 2020-09-04 DIAGNOSIS — H53462 Homonymous bilateral field defects, left side: Secondary | ICD-10-CM | POA: Diagnosis not present

## 2020-09-04 DIAGNOSIS — H5010 Unspecified exotropia: Secondary | ICD-10-CM | POA: Diagnosis not present

## 2020-09-04 DIAGNOSIS — H353111 Nonexudative age-related macular degeneration, right eye, early dry stage: Secondary | ICD-10-CM | POA: Diagnosis not present

## 2020-09-04 DIAGNOSIS — H35371 Puckering of macula, right eye: Secondary | ICD-10-CM | POA: Diagnosis not present

## 2020-09-04 DIAGNOSIS — H25813 Combined forms of age-related cataract, bilateral: Secondary | ICD-10-CM | POA: Diagnosis not present

## 2020-09-04 DIAGNOSIS — H2181 Floppy iris syndrome: Secondary | ICD-10-CM | POA: Diagnosis not present

## 2020-09-04 DIAGNOSIS — H5703 Miosis: Secondary | ICD-10-CM | POA: Diagnosis not present

## 2020-09-22 ENCOUNTER — Other Ambulatory Visit: Payer: Self-pay

## 2020-09-25 ENCOUNTER — Other Ambulatory Visit: Payer: Self-pay

## 2020-09-25 ENCOUNTER — Encounter: Payer: Self-pay | Admitting: Internal Medicine

## 2020-09-25 ENCOUNTER — Ambulatory Visit (INDEPENDENT_AMBULATORY_CARE_PROVIDER_SITE_OTHER): Payer: Medicare Other | Admitting: Internal Medicine

## 2020-09-25 DIAGNOSIS — N390 Urinary tract infection, site not specified: Secondary | ICD-10-CM | POA: Diagnosis not present

## 2020-09-25 DIAGNOSIS — E559 Vitamin D deficiency, unspecified: Secondary | ICD-10-CM

## 2020-09-25 DIAGNOSIS — I10 Essential (primary) hypertension: Secondary | ICD-10-CM | POA: Diagnosis not present

## 2020-09-25 DIAGNOSIS — E538 Deficiency of other specified B group vitamins: Secondary | ICD-10-CM

## 2020-09-25 NOTE — Assessment & Plan Note (Signed)
Cont w/B12 

## 2020-09-25 NOTE — Progress Notes (Signed)
Subjective:  Patient ID: Eric [name], male    DOB: Oct 13, 1940  Age: 80 y.o. MRN: [id number]  CC: Hypertension (Here for one month follow up)   HPI Eric [name] presents for HTN, B12 def, Vit Ddef, OA, UTI SBP 130s at home  Outpatient Medications Prior to Visit  Medication Sig Dispense Refill  . amLODipine-benazepril (LOTREL) 5-20 MG capsule TAKE 1 CAPSULE BY MOUTH EVERY DAY 90 capsule 3  . aspirin 81 MG EC tablet TAKE 1 TABLET BY MOUTH EVERY DAY 90 tablet 1  . [facility] D3 50 MCG (2000 UT) CAPS TAKE 1 CAPSULE BY MOUTH EVERY DAY 100 capsule 3  . Cyanocobalamin (VITAMIN B-12) 1000 MCG SUBL Place 1 tablet (1,000 mcg total) under the tongue daily. 100 tablet 3  . finasteride (PROSCAR) 5 MG tablet Take 1 tablet (5 mg total) by mouth daily. 90 tablet 3  . gabapentin (NEURONTIN) 300 MG capsule TAKE 1 CAP TWO TIMES DAILY FOR 2 WEEKS THEN INCREASE TO 3 TIMES DAILY 270 capsule 3  . oxybutynin (DITROPAN XL) 5 MG 24 hr tablet Take 1 tablet (5 mg total) by mouth at bedtime. For frequent urination 90 tablet 3  . tamsulosin (FLOMAX) 0.4 MG CAPS capsule Take 1 capsule (0.4 mg total) by mouth daily. 90 capsule 3  . traMADol (ULTRAM) 50 MG tablet TAKE 1 TABLET BY MOUTH THREE TIMES A DAY AS NEEDED SEVERE PAIN 90 tablet 3  . ciprofloxacin (CIPRO) 500 MG tablet Take 1 tablet (500 mg total) by mouth 2 (two) times daily. 28 tablet 0   No facility-administered medications prior to visit.    ROS: Review of Systems  Constitutional: Negative for appetite change, fatigue and unexpected weight change.  HENT: Negative for congestion, nosebleeds, sneezing, sore throat and trouble swallowing.   Eyes: Negative for itching and visual disturbance.  Respiratory: Negative for cough.   Cardiovascular: Negative for chest pain, palpitations and leg swelling.  Gastrointestinal: Negative for abdominal distention, blood in stool, diarrhea and nausea.  Genitourinary: Negative for frequency and hematuria.   Musculoskeletal: Positive for arthralgias and back pain. Negative for gait problem, joint swelling and neck pain.  Skin: Negative for rash.  Neurological: Negative for dizziness, tremors, speech difficulty and weakness.  Psychiatric/Behavioral: Negative for agitation, dysphoric mood and sleep disturbance. The patient is not nervous/anxious.     Objective:  BP (!) 150/88 (BP Location: Left Arm, Patient Position: Sitting, Cuff Size: Small)   Pulse 72   Temp 99.5 F (37.5 C) (Oral)   Resp 16   Ht 5\' 7"  (1.702 m)   Wt 152 lb (68.9 kg)   SpO2 95%   BMI 23.81 kg/m   BP Readings from Last 3 Encounters:  09/25/20 (!) 150/88  08/21/20 (!) 162/82  05/22/20 (!) 162/88    Wt Readings from Last 3 Encounters:  09/25/20 152 lb (68.9 kg)  08/21/20 148 lb 9.6 oz (67.4 kg)  05/22/20 143 lb (64.9 kg)    Physical Exam Constitutional:      General: He is not in acute distress.    Appearance: He is well-developed.     Comments: NAD  HENT:     Mouth/Throat:     Mouth: Oropharynx is clear and moist.  Eyes:     Conjunctiva/sclera: Conjunctivae normal.     Pupils: Pupils are equal, round, and reactive to light.  Neck:     Thyroid: No thyromegaly.     Vascular: No JVD.  Cardiovascular:     Rate and Rhythm:  Normal rate and regular rhythm.     Pulses: Intact distal pulses.     Heart sounds: Normal heart sounds. No murmur heard. No friction rub. No gallop.   Pulmonary:     Effort: Pulmonary effort is normal. No respiratory distress.     Breath sounds: Normal breath sounds. No wheezing or rales.  Chest:     Chest wall: No tenderness.  Abdominal:     General: Bowel sounds are normal. There is no distension.     Palpations: Abdomen is soft. There is no mass.     Tenderness: There is no abdominal tenderness. There is no guarding or rebound.  Musculoskeletal:        General: No tenderness or edema. Normal range of motion.     Cervical back: Normal range of motion.  Lymphadenopathy:      Cervical: No cervical adenopathy.  Skin:    General: Skin is warm and dry.     Findings: No rash.  Neurological:     Mental Status: He is alert and oriented to person, place, and time.     Cranial Nerves: No cranial nerve deficit.     Motor: No abnormal muscle tone.     Coordination: He displays a negative Romberg sign. Coordination normal.     Gait: Gait normal.     Deep Tendon Reflexes: Reflexes are normal and symmetric.  Psychiatric:        Mood and Affect: Mood and affect normal.        Behavior: Behavior normal.        Thought Content: Thought content normal.        Judgment: Judgment normal.     Lab Results  Component Value Date   WBC 8.5 08/21/2020   HGB 10.5 (L) 08/21/2020   HCT 32.7 (L) 08/21/2020   PLT 432.0 (H) 08/21/2020   GLUCOSE 103 (H) 08/21/2020   CHOL 189 08/21/2020   TRIG 74.0 08/21/2020   HDL 70.70 08/21/2020   LDLDIRECT 163.3 05/26/2008   LDLCALC 104 (H) 08/21/2020   ALT 9 08/21/2020   AST 12 08/21/2020   NA 140 08/21/2020   K 4.3 08/21/2020   CL 104 08/21/2020   CREATININE 0.87 08/21/2020   BUN 9 08/21/2020   CO2 30 08/21/2020   TSH 0.90 08/21/2020   PSA 0.14 08/21/2020   INR 1.12 07/20/2013    DG Lumbar Spine Complete  Result Date: 10/26/2017 CLINICAL DATA:  Low back pain.  No reported injury. EXAM: LUMBAR SPINE - COMPLETE 4+ VIEW COMPARISON:  None. FINDINGS: This report assumes 5 non rib-bearing lumbar vertebrae. Mild dextrocurvature of the lumbar spine. Lumbar vertebral body heights are preserved, with no fracture. Marked multilevel lumbar degenerative disc disease, most prominent at L2-3, L4-5 and L5-S1. Mild 3 mm retrolisthesis at L2-3 and L3-4. Bilateral lower lumbar facet arthropathy. No aggressive appearing focal osseous lesions. Abdominal aortic atherosclerosis. 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 Mild multilevel degenerative upper lumbar spondylolisthesis. Electronically Signed    By: Delbert Phenix M.D.   On: 10/26/2017 12:00    Assessment & Plan:   There are no diagnoses linked to this encounter.   No orders of the defined types were placed in this encounter.    Follow-up: No follow-ups on file.  Sonda Primes, MD

## 2020-09-25 NOTE — Assessment & Plan Note (Signed)
BP nl at home On Lotrel

## 2020-09-25 NOTE — Addendum Note (Signed)
Addended by: Raliegh Ip on: 09/25/2020 04:21 PM   Modules accepted: Orders

## 2020-09-25 NOTE — Assessment & Plan Note (Addendum)
Treated w/Cipro Renal US, UA - ordered

## 2020-09-25 NOTE — Assessment & Plan Note (Signed)
Cont w/Vit D 

## 2020-09-26 LAB — URINALYSIS
Bilirubin Urine: NEGATIVE
Hgb urine dipstick: NEGATIVE
Ketones, ur: NEGATIVE
Leukocytes,Ua: NEGATIVE
Nitrite: NEGATIVE
Specific Gravity, Urine: 1.015 (ref 1.000–1.030)
Total Protein, Urine: NEGATIVE
Urine Glucose: NEGATIVE
Urobilinogen, UA: 0.2 (ref 0.0–1.0)
pH: 7.5 (ref 5.0–8.0)

## 2020-10-04 ENCOUNTER — Other Ambulatory Visit: Payer: Self-pay

## 2020-10-04 ENCOUNTER — Ambulatory Visit
Admission: RE | Admit: 2020-10-04 | Discharge: 2020-10-04 | Disposition: A | Discharge status: Home / Self Care | Payer: Medicare Other | Source: Ambulatory Visit | Attending: Internal Medicine | Admitting: Internal Medicine

## 2020-10-04 DIAGNOSIS — N390 Urinary tract infection, site not specified: Secondary | ICD-10-CM

## 2020-10-07 ENCOUNTER — Other Ambulatory Visit: Payer: Self-pay | Admitting: Internal Medicine

## 2020-10-07 DIAGNOSIS — N390 Urinary tract infection, site not specified: Secondary | ICD-10-CM

## 2020-10-07 DIAGNOSIS — N402 Nodular prostate without lower urinary tract symptoms: Secondary | ICD-10-CM

## 2020-10-18 ENCOUNTER — Other Ambulatory Visit: Payer: Self-pay | Admitting: Internal Medicine

## 2020-10-19 ENCOUNTER — Other Ambulatory Visit: Payer: Self-pay | Admitting: Internal Medicine

## 2020-11-17 DIAGNOSIS — H25811 Combined forms of age-related cataract, right eye: Secondary | ICD-10-CM | POA: Diagnosis not present

## 2020-11-21 ENCOUNTER — Encounter: Payer: Medicare Other | Admitting: Internal Medicine

## 2020-12-11 ENCOUNTER — Telehealth: Payer: Self-pay | Admitting: Internal Medicine

## 2020-12-11 NOTE — Telephone Encounter (Signed)
    Refill request for traMADol (ULTRAM) 50 MG tablet  Pharmacy [id number] - [place], [place].

## 2020-12-11 NOTE — Telephone Encounter (Signed)
Check [place] registry last filled 10/30/2020../lmb  

## 2020-12-12 MED ORDER — TRAMADOL HCL 50 MG PO TABS: ORAL_TABLET | ORAL | 3 refills | Status: DC

## 2020-12-12 NOTE — Telephone Encounter (Signed)
Okay.  Done.  Thanks 

## 2020-12-22 ENCOUNTER — Other Ambulatory Visit: Payer: Self-pay

## 2020-12-25 ENCOUNTER — Ambulatory Visit (INDEPENDENT_AMBULATORY_CARE_PROVIDER_SITE_OTHER): Payer: Medicare Other | Admitting: Internal Medicine

## 2020-12-25 ENCOUNTER — Encounter: Payer: Self-pay | Admitting: Internal Medicine

## 2020-12-25 ENCOUNTER — Other Ambulatory Visit: Payer: Self-pay

## 2020-12-25 VITALS — BP 158/80 | HR 90 | Temp 98.1°F | Ht 67.0 in | Wt 147.4 lb

## 2020-12-25 DIAGNOSIS — I10 Essential (primary) hypertension: Secondary | ICD-10-CM | POA: Diagnosis not present

## 2020-12-25 DIAGNOSIS — R9341 Abnormal radiologic findings on diagnostic imaging of renal pelvis, ureter, or bladder: Secondary | ICD-10-CM | POA: Diagnosis not present

## 2020-12-25 DIAGNOSIS — R351 Nocturia: Secondary | ICD-10-CM

## 2020-12-25 DIAGNOSIS — N401 Enlarged prostate with lower urinary tract symptoms: Secondary | ICD-10-CM

## 2020-12-25 DIAGNOSIS — Z0000 Encounter for general adult medical examination without abnormal findings: Secondary | ICD-10-CM | POA: Diagnosis not present

## 2020-12-25 DIAGNOSIS — E538 Deficiency of other specified B group vitamins: Secondary | ICD-10-CM

## 2020-12-25 DIAGNOSIS — E559 Vitamin D deficiency, unspecified: Secondary | ICD-10-CM

## 2020-12-25 NOTE — Assessment & Plan Note (Addendum)
Pt was ref to see Dr Tresa Moore (abn bladder US)  - he thinks he saw a urologist. Will find out, obtain records

## 2020-12-25 NOTE — Assessment & Plan Note (Signed)
On B12 

## 2020-12-25 NOTE — Assessment & Plan Note (Signed)
On Lotrel

## 2020-12-25 NOTE — Assessment & Plan Note (Signed)

## 2020-12-25 NOTE — Assessment & Plan Note (Signed)
On Vit D 

## 2020-12-25 NOTE — Assessment & Plan Note (Addendum)
2/22 Pt was ref to see Dr Tresa Moore (abn bladder US)  - he thinks he saw a urologist. Will find out, obtain records

## 2020-12-25 NOTE — Progress Notes (Addendum)
Subjective:  Patient ID: Eric [name], male    DOB: 05-Feb-1941  Age: 80 y.o. MRN: [id number]  CC: Annual Exam   HPI FELIMON CEPHAS presents for a well exam C/o OAB - he went to see a Urologist F/u HTN, abn bladder US  Outpatient Medications Prior to Visit  Medication Sig Dispense Refill  . amLODipine-benazepril (LOTREL) 5-20 MG capsule TAKE 1 CAPSULE BY MOUTH EVERY DAY 90 capsule 3  . aspirin 81 MG EC tablet TAKE 1 TABLET BY MOUTH EVERY DAY 90 tablet 1  . [facility] D3 50 MCG (2000 UT) CAPS TAKE 1 CAPSULE BY MOUTH EVERY DAY 100 capsule 3  . Cyanocobalamin (VITAMIN B-12) 1000 MCG SUBL Place 1 tablet (1,000 mcg total) under the tongue daily. 100 tablet 3  . finasteride (PROSCAR) 5 MG tablet TAKE 1 TABLET BY MOUTH EVERY DAY 90 tablet 3  . gabapentin (NEURONTIN) 300 MG capsule TAKE 1 CAP TWO TIMES DAILY FOR 2 WEEKS THEN INCREASE TO 3 TIMES DAILY 270 capsule 3  . oxybutynin (DITROPAN-XL) 5 MG 24 hr tablet TAKE 1 TABLET (5 MG TOTAL) BY MOUTH AT BEDTIME. FOR FREQUENT URINATION 90 tablet 3  . tamsulosin (FLOMAX) 0.4 MG CAPS capsule Take 1 capsule (0.4 mg total) by mouth daily. 90 capsule 3  . traMADol (ULTRAM) 50 MG tablet TAKE 1 TABLET BY MOUTH THREE TIMES A DAY AS NEEDED SEVERE PAIN 90 tablet 3   No facility-administered medications prior to visit.    ROS: Review of Systems  Constitutional: Negative for appetite change, fatigue and unexpected weight change.  HENT: Negative for congestion, nosebleeds, sneezing, sore throat and trouble swallowing.   Eyes: Negative for itching and visual disturbance.  Respiratory: Negative for cough.   Cardiovascular: Negative for chest pain, palpitations and leg swelling.  Gastrointestinal: Negative for abdominal distention, blood in stool, diarrhea and nausea.  Genitourinary: Positive for urgency. Negative for frequency and hematuria.  Musculoskeletal: Positive for arthralgias. Negative for back pain, gait problem, joint swelling and neck pain.  Skin:  Negative for rash.  Neurological: Negative for dizziness, tremors, speech difficulty and weakness.  Psychiatric/Behavioral: Negative for agitation, dysphoric mood and sleep disturbance. The patient is not nervous/anxious.     Objective:  BP (!) 158/80 (BP Location: Left Arm)   Pulse 90   Temp 98.1 F (36.7 C) (Oral)   Ht 5\' 7"  (1.702 m)   Wt 147 lb 6.4 oz (66.9 kg)   SpO2 97%   BMI 23.09 kg/m   BP Readings from Last 3 Encounters:  12/25/20 (!) 158/80  09/25/20 (!) 150/88  08/21/20 (!) 162/82    Wt Readings from Last 3 Encounters:  12/25/20 147 lb 6.4 oz (66.9 kg)  09/25/20 152 lb (68.9 kg)  08/21/20 148 lb 9.6 oz (67.4 kg)    Physical Exam Constitutional:      General: He is not in acute distress.    Appearance: He is well-developed.     Comments: NAD  Eyes:     Conjunctiva/sclera: Conjunctivae normal.     Pupils: Pupils are equal, round, and reactive to light.  Neck:     Thyroid: No thyromegaly.     Vascular: No JVD.  Cardiovascular:     Rate and Rhythm: Normal rate and regular rhythm.     Heart sounds: Normal heart sounds. No murmur heard. No friction rub. No gallop.   Pulmonary:     Effort: Pulmonary effort is normal. No respiratory distress.     Breath sounds: Normal breath  sounds. No wheezing or rales.  Chest:     Chest wall: No tenderness.  Abdominal:     General: Bowel sounds are normal. There is no distension.     Palpations: Abdomen is soft. There is no mass.     Tenderness: There is no abdominal tenderness. There is no guarding or rebound.  Musculoskeletal:        General: No tenderness. Normal range of motion.     Cervical back: Normal range of motion.  Lymphadenopathy:     Cervical: No cervical adenopathy.  Skin:    General: Skin is warm and dry.     Findings: No rash.  Neurological:     Mental Status: He is alert and oriented to person, place, and time.     Cranial Nerves: No cranial nerve deficit.     Motor: No abnormal muscle tone.      Coordination: Coordination normal.     Gait: Gait normal.     Deep Tendon Reflexes: Reflexes are normal and symmetric.  Psychiatric:        Behavior: Behavior normal.        Thought Content: Thought content normal.        Judgment: Judgment normal.      I spent 22 minutes in addition to time for CPX wellness examination in preparing to see the patient by review of recent labs, imaging and procedures, obtaining and reviewing separately obtained history, communicating with the patient, ordering medications, tests or procedures, and documenting clinical information in the EHR including the differential diagnosis, treatment, and any further evaluation and other management of OAB, abn bladder US in 2/22.         Lab Results  Component Value Date   WBC 8.5 08/21/2020   HGB 10.5 (L) 08/21/2020   HCT 32.7 (L) 08/21/2020   PLT 432.0 (H) 08/21/2020   GLUCOSE 103 (H) 08/21/2020   CHOL 189 08/21/2020   TRIG 74.0 08/21/2020   HDL 70.70 08/21/2020   LDLDIRECT 163.3 05/26/2008   LDLCALC 104 (H) 08/21/2020   ALT 9 08/21/2020   AST 12 08/21/2020   NA 140 08/21/2020   K 4.3 08/21/2020   CL 104 08/21/2020   CREATININE 0.87 08/21/2020   BUN 9 08/21/2020   CO2 30 08/21/2020   TSH 0.90 08/21/2020   PSA 0.14 08/21/2020   INR 1.12 07/20/2013    US Renal  Result Date: 10/05/2020 CLINICAL DATA:  UTI in September 2021 EXAM: RENAL / URINARY TRACT ULTRASOUND COMPLETE COMPARISON:  None. FINDINGS: Right Kidney: Renal measurements: 10.4 x 5.7 x 4.3 cm = volume: 133 mL. Echogenicity within normal limits. No mass or hydronephrosis visualized. Left Kidney: Renal measurements: 9.4 x 4.9 x 4.0 cm = volume: 95 mL. Echogenicity within normal limits. No mass or hydronephrosis visualized. Bladder: Mild diffuse thickening of the bladder wall most likely due to under distension. Chronic cystitis and outlet obstruction can have a similar appearance. Other: Nodular prostate impressing upon the bladder neck. IMPRESSION:  1. No significant abnormality of the kidneys. 2. Diffuse bladder wall thickening likely due to underdistention, however are chronic cystitis or outlet obstruction can have a similar appearance. 3. Nodular prostate impressing upon the bladder neck. Electronically Signed   By: Acquanetta Belling M.D.   On: 10/05/2020 08:49    Assessment & Plan:   There are no diagnoses linked to this encounter.   No orders of the defined types were placed in this encounter.    Follow-up: No follow-ups on file.  Sonda Primes, MD

## 2020-12-26 ENCOUNTER — Telehealth: Payer: Self-pay | Admitting: *Deleted

## 2020-12-26 NOTE — Telephone Encounter (Signed)
Called [facility] [phone])-.Marland Kitchen No pt is actually scheduled to see Dr. Tresa Moore 01/01/21.Marland KitchenJohny Chess

## 2020-12-26 NOTE — Telephone Encounter (Signed)
-----   Message from Cassandria Anger, MD sent at 12/25/2020  4:13 PM EDT ----- Eric [name], Pt was ref to see Dr Tresa Moore (abn bladder US) in 2/22 - he thinks he did see a urologist. Can we find out for sure? Thx

## 2020-12-26 NOTE — Telephone Encounter (Signed)
Thanks, AP

## 2021-01-22 ENCOUNTER — Other Ambulatory Visit: Payer: Self-pay | Admitting: Internal Medicine

## 2021-02-05 DIAGNOSIS — R3912 Poor urinary stream: Secondary | ICD-10-CM | POA: Diagnosis not present

## 2021-02-05 DIAGNOSIS — N3000 Acute cystitis without hematuria: Secondary | ICD-10-CM | POA: Diagnosis not present

## 2021-02-13 DIAGNOSIS — H2512 Age-related nuclear cataract, left eye: Secondary | ICD-10-CM | POA: Diagnosis not present

## 2021-02-16 DIAGNOSIS — H25812 Combined forms of age-related cataract, left eye: Secondary | ICD-10-CM | POA: Diagnosis not present

## 2021-02-16 DIAGNOSIS — H21562 Pupillary abnormality, left eye: Secondary | ICD-10-CM | POA: Diagnosis not present

## 2021-03-14 ENCOUNTER — Telehealth: Payer: Self-pay | Admitting: Internal Medicine

## 2021-03-14 NOTE — Telephone Encounter (Signed)
Patient's daughter declined AWV.

## 2021-04-05 ENCOUNTER — Encounter: Payer: Self-pay | Admitting: Gastroenterology

## 2021-04-24 ENCOUNTER — Encounter: Payer: Self-pay | Admitting: Internal Medicine

## 2021-04-24 ENCOUNTER — Ambulatory Visit (INDEPENDENT_AMBULATORY_CARE_PROVIDER_SITE_OTHER): Payer: Medicare Other | Admitting: Internal Medicine

## 2021-04-24 ENCOUNTER — Other Ambulatory Visit: Payer: Self-pay

## 2021-04-24 DIAGNOSIS — R351 Nocturia: Secondary | ICD-10-CM

## 2021-04-24 DIAGNOSIS — I10 Essential (primary) hypertension: Secondary | ICD-10-CM | POA: Diagnosis not present

## 2021-04-24 DIAGNOSIS — E538 Deficiency of other specified B group vitamins: Secondary | ICD-10-CM

## 2021-04-24 DIAGNOSIS — N401 Enlarged prostate with lower urinary tract symptoms: Secondary | ICD-10-CM | POA: Diagnosis not present

## 2021-04-24 DIAGNOSIS — E559 Vitamin D deficiency, unspecified: Secondary | ICD-10-CM

## 2021-04-24 DIAGNOSIS — M19011 Primary osteoarthritis, right shoulder: Secondary | ICD-10-CM

## 2021-04-24 DIAGNOSIS — G2581 Restless legs syndrome: Secondary | ICD-10-CM | POA: Diagnosis not present

## 2021-04-24 MED ORDER — AMLODIPINE BESY-BENAZEPRIL HCL 5-20 MG PO CAPS: ORAL_CAPSULE | ORAL | 3 refills | Status: DC

## 2021-04-24 MED ORDER — TRAMADOL HCL 50 MG PO TABS: 50.0000 mg | ORAL_TABLET | Freq: Four times a day (QID) | ORAL | 3 refills | Status: DC | PRN

## 2021-04-24 MED ORDER — CVS D3 50 MCG (2000 UT) PO CAPS: 2000.0000 [IU] | ORAL_CAPSULE | Freq: Every day | ORAL | 3 refills | Status: DC

## 2021-04-24 MED ORDER — VITAMIN B-12 1000 MCG SL SUBL: 1.0000 | SUBLINGUAL_TABLET | Freq: Every day | SUBLINGUAL | 3 refills | Status: DC

## 2021-04-24 MED ORDER — TAMSULOSIN HCL 0.4 MG PO CAPS: 0.4000 mg | ORAL_CAPSULE | Freq: Every day | ORAL | 3 refills | Status: DC

## 2021-04-24 MED ORDER — FINASTERIDE 5 MG PO TABS: 5.0000 mg | ORAL_TABLET | Freq: Every day | ORAL | 3 refills | Status: DC

## 2021-04-24 NOTE — Assessment & Plan Note (Signed)
Continue with tramadol prn  Potential benefits of a long term opioids use as well as potential risks (i.e. addiction risk, apnea etc) and complications (i.e. Somnolence, constipation and others) were explained to the patient and were aknowledged. 

## 2021-04-24 NOTE — Assessment & Plan Note (Signed)
Tramadol seems to help at 2 tablets at night.  Prescription provided.  Potential benefits of a long term opioids use as well as potential risks (i.e. addiction risk, apnea etc) and complications (i.e. Somnolence, constipation and others) were explained to the patient and were aknowledged.

## 2021-04-24 NOTE — Assessment & Plan Note (Signed)
Suboptimal control.  Continue with Lotrel daily

## 2021-04-24 NOTE — Assessment & Plan Note (Signed)
Continue with vitamin B12 at 1000 mcg a day

## 2021-04-24 NOTE — Assessment & Plan Note (Signed)
- 

## 2021-04-24 NOTE — Progress Notes (Signed)
Subjective:  Patient ID: Eric [name], male    DOB: 04-04-1941  Age: 80 y.o. MRN: [id number]  CC: Follow-up (4 MONTH F/U- Need referral to have colonoscopy) and Medication Refill (Req refills on amlodipine-benazepril, flomax and tramadol)   HPI [name] presents for OA, B12 def, BPH Planing of restless leg type of symptoms at night.  On occasion he has to take 2 tramadol's at bedtime to help it.  Outpatient Medications Prior to Visit  Medication Sig Dispense Refill   aspirin 81 MG EC tablet TAKE 1 TABLET BY MOUTH EVERY DAY 90 tablet 1   [facility] D3 50 MCG (2000 UT) CAPS TAKE 1 CAPSULE BY MOUTH EVERY DAY 100 capsule 3   Cyanocobalamin (VITAMIN B-12) 1000 MCG SUBL Place 1 tablet (1,000 mcg total) under the tongue daily. 100 tablet 3   gabapentin (NEURONTIN) 300 MG capsule TAKE 1 CAP TWO TIMES DAILY FOR 2 WEEKS THEN INCREASE TO 3 TIMES DAILY 270 capsule 3   oxybutynin (DITROPAN-XL) 5 MG 24 hr tablet TAKE 1 TABLET (5 MG TOTAL) BY MOUTH AT BEDTIME. FOR FREQUENT URINATION 90 tablet 3   amLODipine-benazepril (LOTREL) 5-20 MG capsule TAKE 1 CAPSULE BY MOUTH EVERY DAY 90 capsule 3   finasteride (PROSCAR) 5 MG tablet TAKE 1 TABLET BY MOUTH EVERY DAY 90 tablet 3   tamsulosin (FLOMAX) 0.4 MG CAPS capsule TAKE 1 CAPSULE BY MOUTH EVERY DAY 90 capsule 3   traMADol (ULTRAM) 50 MG tablet TAKE 1 TABLET BY MOUTH THREE TIMES A DAY AS NEEDED SEVERE PAIN 90 tablet 3   No facility-administered medications prior to visit.    ROS: Review of Systems  Constitutional:  Negative for appetite change, fatigue and unexpected weight change.  HENT:  Negative for congestion, nosebleeds, sneezing, sore throat and trouble swallowing.   Eyes:  Negative for itching and visual disturbance.  Respiratory:  Negative for cough.   Cardiovascular:  Negative for chest pain, palpitations and leg swelling.  Gastrointestinal:  Negative for abdominal distention, blood in stool, diarrhea and nausea.  Genitourinary:   Positive for frequency. Negative for hematuria.  Musculoskeletal:  Positive for arthralgias, back pain and gait problem. Negative for joint swelling and neck pain.  Skin:  Negative for rash.  Neurological:  Negative for dizziness, tremors, speech difficulty and weakness.  Psychiatric/Behavioral:  Negative for agitation, dysphoric mood, sleep disturbance and suicidal ideas. The patient is not nervous/anxious.    Objective:  BP (!) 142/78 (BP Location: Left Arm)   Temp 97.8 F (36.6 C) (Oral)   Ht 5\' 7"  (1.702 m)   Wt 152 lb 3.2 oz (69 kg)   SpO2 97%   BMI 23.84 kg/m   BP Readings from Last 3 Encounters:  04/24/21 (!) 142/78  12/25/20 (!) 158/80  09/25/20 (!) 150/88    Wt Readings from Last 3 Encounters:  04/24/21 152 lb 3.2 oz (69 kg)  12/25/20 147 lb 6.4 oz (66.9 kg)  09/25/20 152 lb (68.9 kg)    Physical Exam Constitutional:      General: He is not in acute distress.    Appearance: He is well-developed.     Comments: NAD  Eyes:     Conjunctiva/sclera: Conjunctivae normal.     Pupils: Pupils are equal, round, and reactive to light.  Neck:     Thyroid: No thyromegaly.     Vascular: No JVD.  Cardiovascular:     Rate and Rhythm: Normal rate and regular rhythm.     Heart sounds: Normal heart  sounds. No murmur heard.   No friction rub. No gallop.  Pulmonary:     Effort: Pulmonary effort is normal. No respiratory distress.     Breath sounds: Normal breath sounds. No wheezing or rales.  Chest:     Chest wall: No tenderness.  Abdominal:     General: Bowel sounds are normal. There is no distension.     Palpations: Abdomen is soft. There is no mass.     Tenderness: There is no abdominal tenderness. There is no guarding or rebound.  Musculoskeletal:        General: Tenderness present. Normal range of motion.     Cervical back: Normal range of motion.  Lymphadenopathy:     Cervical: No cervical adenopathy.  Skin:    General: Skin is warm and dry.     Findings: No rash.   Neurological:     Mental Status: He is alert and oriented to person, place, and time.     Cranial Nerves: No cranial nerve deficit.     Motor: No abnormal muscle tone.     Coordination: Coordination normal.     Gait: Gait abnormal.     Deep Tendon Reflexes: Reflexes are normal and symmetric.  Psychiatric:        Behavior: Behavior normal.        Thought Content: Thought content normal.        Judgment: Judgment normal.   The patient is using a cane.  Lumbar spine tender to palpation Lab Results  Component Value Date   WBC 8.5 08/21/2020   HGB 10.5 (L) 08/21/2020   HCT 32.7 (L) 08/21/2020   PLT 432.0 (H) 08/21/2020   GLUCOSE 103 (H) 08/21/2020   CHOL 189 08/21/2020   TRIG 74.0 08/21/2020   HDL 70.70 08/21/2020   LDLDIRECT 163.3 05/26/2008   LDLCALC 104 (H) 08/21/2020   ALT 9 08/21/2020   AST 12 08/21/2020   NA 140 08/21/2020   K 4.3 08/21/2020   CL 104 08/21/2020   CREATININE 0.87 08/21/2020   BUN 9 08/21/2020   CO2 30 08/21/2020   TSH 0.90 08/21/2020   PSA 0.14 08/21/2020   INR 1.12 07/20/2013    US Renal  Result Date: 10/05/2020 CLINICAL DATA:  UTI in September 2021 EXAM: RENAL / URINARY TRACT ULTRASOUND COMPLETE COMPARISON:  None. FINDINGS: Right Kidney: Renal measurements: 10.4 x 5.7 x 4.3 cm = volume: 133 mL. Echogenicity within normal limits. No mass or hydronephrosis visualized. Left Kidney: Renal measurements: 9.4 x 4.9 x 4.0 cm = volume: 95 mL. Echogenicity within normal limits. No mass or hydronephrosis visualized. Bladder: Mild diffuse thickening of the bladder wall most likely due to under distension. Chronic cystitis and outlet obstruction can have a similar appearance. Other: Nodular prostate impressing upon the bladder neck. IMPRESSION: 1. No significant abnormality of the kidneys. 2. Diffuse bladder wall thickening likely due to underdistention, however are chronic cystitis or outlet obstruction can have a similar appearance. 3. Nodular prostate impressing upon  the bladder neck. Electronically Signed   By: Acquanetta Belling M.D.   On: 10/05/2020 08:49    Assessment & Plan:   There are no diagnoses linked to this encounter.   Meds ordered this encounter  Medications   amLODipine-benazepril (LOTREL) 5-20 MG capsule    Sig: TAKE 1 CAPSULE BY MOUTH EVERY DAY    Dispense:  90 capsule    Refill:  3   tamsulosin (FLOMAX) 0.4 MG CAPS capsule    Sig: Take 1 capsule (  0.4 mg total) by mouth daily.    Dispense:  90 capsule    Refill:  3   traMADol (ULTRAM) 50 MG tablet    Sig: Take 1 tablet (50 mg total) by mouth 4 (four) times daily as needed. TAKE 1 TABLET BY MOUTH THREE TIMES A DAY AS NEEDED SEVERE PAIN    Dispense:  120 tablet    Refill:  3   finasteride (PROSCAR) 5 MG tablet    Sig: Take 1 tablet (5 mg total) by mouth daily.    Dispense:  90 tablet    Refill:  3     Follow-up: No follow-ups on file.  Sonda Primes, MD

## 2021-04-24 NOTE — Assessment & Plan Note (Addendum)
Reasonably well-controlled.  Continue with Flomax and Proscar.  Prescriptions provided

## 2021-05-09 ENCOUNTER — Telehealth: Payer: Self-pay

## 2021-05-09 NOTE — Telephone Encounter (Signed)
Medication refilled on 04/24/21 for 17yrsupply. Upon speaking to daughter JThayer Headings she confirms that pt is taking "a blood pressure medication" but is not sure if he picked up a new prescription. JThayer Headingsstates she will confirm with patient & call pharmacy to pick up prescription.  JThayer Headingsadvised that if pt is taking medication as prescribed, he should need to pick up a refill every 336month verb understanding.

## 2021-07-04 ENCOUNTER — Ambulatory Visit: Payer: Medicare Other | Admitting: Gastroenterology

## 2021-08-02 ENCOUNTER — Encounter: Payer: Self-pay | Admitting: Internal Medicine

## 2021-08-02 ENCOUNTER — Ambulatory Visit (INDEPENDENT_AMBULATORY_CARE_PROVIDER_SITE_OTHER): Payer: Medicare Other | Admitting: Internal Medicine

## 2021-08-02 ENCOUNTER — Other Ambulatory Visit: Payer: Self-pay

## 2021-08-02 VITALS — BP 158/88 | HR 87 | Temp 98.6°F | Ht 67.0 in | Wt 152.6 lb

## 2021-08-02 DIAGNOSIS — Z0000 Encounter for general adult medical examination without abnormal findings: Secondary | ICD-10-CM

## 2021-08-02 DIAGNOSIS — E538 Deficiency of other specified B group vitamins: Secondary | ICD-10-CM | POA: Diagnosis not present

## 2021-08-02 DIAGNOSIS — E559 Vitamin D deficiency, unspecified: Secondary | ICD-10-CM

## 2021-08-02 DIAGNOSIS — R351 Nocturia: Secondary | ICD-10-CM

## 2021-08-02 DIAGNOSIS — Z23 Encounter for immunization: Secondary | ICD-10-CM

## 2021-08-02 DIAGNOSIS — I10 Essential (primary) hypertension: Secondary | ICD-10-CM

## 2021-08-02 DIAGNOSIS — N401 Enlarged prostate with lower urinary tract symptoms: Secondary | ICD-10-CM

## 2021-08-02 DIAGNOSIS — M19011 Primary osteoarthritis, right shoulder: Secondary | ICD-10-CM

## 2021-08-02 MED ORDER — TRAMADOL HCL 50 MG PO TABS: 50.0000 mg | ORAL_TABLET | Freq: Four times a day (QID) | ORAL | 3 refills | Status: DC | PRN

## 2021-08-02 NOTE — Assessment & Plan Note (Signed)
Stable  Cont on Proscar, Flomax

## 2021-08-02 NOTE — Assessment & Plan Note (Signed)
Continue with tramadol prn  Potential benefits of a long term opioids use as well as potential risks (i.e. addiction risk, apnea etc) and complications (i.e. Somnolence, constipation and others) were explained to the patient and were aknowledged. 

## 2021-08-02 NOTE — Assessment & Plan Note (Signed)
Stable Cont w/Vit B12 1000 mcg/d

## 2021-08-02 NOTE — Assessment & Plan Note (Signed)
Stable Cont w/Vit D3 2000 iu/d

## 2021-08-02 NOTE — Progress Notes (Signed)
Subjective:  Patient ID: Eric [name], male    DOB: [birth year]  Age: 80 y.o. MRN: [id number]  CC: Follow-up (3 month f/u- flu shot)   HPI SHAKER SARKER presents for HTN, B12 def, BPH. Pt ran out of pills - ok now  Outpatient Medications Prior to Visit  Medication Sig Dispense Refill   amLODipine-benazepril (LOTREL) 5-20 MG capsule TAKE 1 CAPSULE BY MOUTH EVERY DAY 90 capsule 3   aspirin 81 MG EC tablet TAKE 1 TABLET BY MOUTH EVERY DAY 90 tablet 1   Cholecalciferol ([facility] D3) 50 MCG (2000 UT) CAPS Take 1 capsule (2,000 Units total) by mouth daily. 100 capsule 3   Cyanocobalamin (VITAMIN B-12) 1000 MCG SUBL Place 1 tablet (1,000 mcg total) under the tongue daily. 100 tablet 3   finasteride (PROSCAR) 5 MG tablet Take 1 tablet (5 mg total) by mouth daily. 90 tablet 3   gabapentin (NEURONTIN) 300 MG capsule TAKE 1 CAP TWO TIMES DAILY FOR 2 WEEKS THEN INCREASE TO 3 TIMES DAILY 270 capsule 3   oxybutynin (DITROPAN-XL) 5 MG 24 hr tablet TAKE 1 TABLET (5 MG TOTAL) BY MOUTH AT BEDTIME. FOR FREQUENT URINATION 90 tablet 3   tamsulosin (FLOMAX) 0.4 MG CAPS capsule Take 1 capsule (0.4 mg total) by mouth daily. 90 capsule 3   traMADol (ULTRAM) 50 MG tablet Take 1 tablet (50 mg total) by mouth 4 (four) times daily as needed. TAKE 1 TABLET BY MOUTH THREE TIMES A DAY AS NEEDED SEVERE PAIN 120 tablet 3   No facility-administered medications prior to visit.    ROS: Review of Systems  Constitutional:  Negative for appetite change, fatigue and unexpected weight change.  HENT:  Negative for congestion, nosebleeds, sneezing, sore throat and trouble swallowing.   Eyes:  Negative for itching and visual disturbance.  Respiratory:  Negative for cough.   Cardiovascular:  Negative for chest pain, palpitations and leg swelling.  Gastrointestinal:  Negative for abdominal distention, blood in stool, diarrhea and nausea.  Genitourinary:  Negative for frequency and hematuria.  Musculoskeletal:  Positive for  arthralgias and gait problem. Negative for back pain, joint swelling and neck pain.  Skin:  Negative for rash.  Neurological:  Negative for dizziness, tremors, speech difficulty and weakness.  Psychiatric/Behavioral:  Negative for agitation, dysphoric mood, sleep disturbance and suicidal ideas. The patient is not nervous/anxious.    Objective:  BP (!) 158/88 (BP Location: Left Arm)   Pulse 87   Temp 98.6 F (37 C) (Oral)   Ht 5\' 7"  (1.702 m)   Wt 152 lb 9.6 oz (69.2 kg)   SpO2 96%   BMI 23.90 kg/m   BP Readings from Last 3 Encounters:  08/02/21 (!) 158/88  04/24/21 (!) 142/78  12/25/20 (!) 158/80    Wt Readings from Last 3 Encounters:  08/02/21 152 lb 9.6 oz (69.2 kg)  04/24/21 152 lb 3.2 oz (69 kg)  12/25/20 147 lb 6.4 oz (66.9 kg)    Physical Exam Constitutional:      General: He is not in acute distress.    Appearance: Normal appearance. He is well-developed.     Comments: NAD  Eyes:     Conjunctiva/sclera: Conjunctivae normal.     Pupils: Pupils are equal, round, and reactive to light.  Neck:     Thyroid: No thyromegaly.     Vascular: No JVD.  Cardiovascular:     Rate and Rhythm: Normal rate and regular rhythm.     Heart sounds: Normal heart  sounds. No murmur heard.   No friction rub. No gallop.  Pulmonary:     Effort: Pulmonary effort is normal. No respiratory distress.     Breath sounds: Normal breath sounds. No wheezing or rales.  Chest:     Chest wall: No tenderness.  Abdominal:     General: Bowel sounds are normal. There is no distension.     Palpations: Abdomen is soft. There is no mass.     Tenderness: There is no abdominal tenderness. There is no guarding or rebound.  Musculoskeletal:        General: No tenderness. Normal range of motion.     Cervical back: Normal range of motion.  Lymphadenopathy:     Cervical: No cervical adenopathy.  Skin:    General: Skin is warm and dry.     Findings: No rash.  Neurological:     Mental Status: He is alert  and oriented to person, place, and time.     Cranial Nerves: No cranial nerve deficit.     Motor: No abnormal muscle tone.     Coordination: Coordination normal.     Gait: Gait abnormal.     Deep Tendon Reflexes: Reflexes are normal and symmetric.  Psychiatric:        Behavior: Behavior normal.        Thought Content: Thought content normal.        Judgment: Judgment normal.  Using a cane  Lab Results  Component Value Date   WBC 8.5 08/21/2020   HGB 10.5 (L) 08/21/2020   HCT 32.7 (L) 08/21/2020   PLT 432.0 (H) 08/21/2020   GLUCOSE 103 (H) 08/21/2020   CHOL 189 08/21/2020   TRIG 74.0 08/21/2020   HDL 70.70 08/21/2020   LDLDIRECT 163.3 05/26/2008   LDLCALC 104 (H) 08/21/2020   ALT 9 08/21/2020   AST 12 08/21/2020   NA 140 08/21/2020   K 4.3 08/21/2020   CL 104 08/21/2020   CREATININE 0.87 08/21/2020   BUN 9 08/21/2020   CO2 30 08/21/2020   TSH 0.90 08/21/2020   PSA 0.14 08/21/2020   INR 1.12 07/20/2013    US Renal  Result Date: 10/05/2020 CLINICAL DATA:  UTI in September 2021 EXAM: RENAL / URINARY TRACT ULTRASOUND COMPLETE COMPARISON:  None. FINDINGS: Right Kidney: Renal measurements: 10.4 x 5.7 x 4.3 cm = volume: 133 mL. Echogenicity within normal limits. No mass or hydronephrosis visualized. Left Kidney: Renal measurements: 9.4 x 4.9 x 4.0 cm = volume: 95 mL. Echogenicity within normal limits. No mass or hydronephrosis visualized. Bladder: Mild diffuse thickening of the bladder wall most likely due to under distension. Chronic cystitis and outlet obstruction can have a similar appearance. Other: Nodular prostate impressing upon the bladder neck. IMPRESSION: 1. No significant abnormality of the kidneys. 2. Diffuse bladder wall thickening likely due to underdistention, however are chronic cystitis or outlet obstruction can have a similar appearance. 3. Nodular prostate impressing upon the bladder neck. Electronically Signed   By: Acquanetta Belling M.D.   On: 10/05/2020 08:49     Assessment & Plan:   Problem List Items Addressed This Visit     B12 deficiency - Primary    Stable Cont w/Vit B12 1000 mcg/d      Relevant Orders   Vitamin B12   BPH associated with nocturia    Stable  Cont on Proscar, Flomax       Essential hypertension    Cont on Lotrel BP nl at home per pt  Osteoarthritis    Continue with tramadol prn  Potential benefits of a long term opioids use as well as potential risks (i.e. addiction risk, apnea etc) and complications (i.e. Somnolence, constipation and others) were explained to the patient and were aknowledged.      Relevant Medications   traMADol (ULTRAM) 50 MG tablet   Vitamin D deficiency    Stable Cont w/Vit D3 2000 iu/d      Relevant Orders   VITAMIN D 25 Hydroxy (Vit-D Deficiency, Fractures)   Well adult exam   Relevant Orders   TSH   Urinalysis   CBC with Differential/Platelet   Lipid panel   PSA   Comprehensive metabolic panel   Other Visit Diagnoses     Needs flu shot       Relevant Orders   Flu Vaccine QUAD High Dose(Fluad) (Completed)         Meds ordered this encounter  Medications   traMADol (ULTRAM) 50 MG tablet    Sig: Take 1 tablet (50 mg total) by mouth 4 (four) times daily as needed. TAKE 1 TABLET BY MOUTH THREE TIMES A DAY AS NEEDED SEVERE PAIN    Dispense:  120 tablet    Refill:  3       Follow-up: Return in about 3 months (around 10/31/2021) for Wellness Exam.  Sonda Primes, MD

## 2021-08-02 NOTE — Assessment & Plan Note (Signed)
Cont on Lotrel BP nl at home per pt 

## 2021-10-15 DIAGNOSIS — H353111 Nonexudative age-related macular degeneration, right eye, early dry stage: Secondary | ICD-10-CM | POA: Diagnosis not present

## 2021-10-15 DIAGNOSIS — H35371 Puckering of macula, right eye: Secondary | ICD-10-CM | POA: Diagnosis not present

## 2021-10-15 DIAGNOSIS — H53462 Homonymous bilateral field defects, left side: Secondary | ICD-10-CM | POA: Diagnosis not present

## 2021-10-15 DIAGNOSIS — Z961 Presence of intraocular lens: Secondary | ICD-10-CM | POA: Diagnosis not present

## 2021-11-01 ENCOUNTER — Encounter: Payer: Self-pay | Admitting: Internal Medicine

## 2021-11-01 ENCOUNTER — Ambulatory Visit (INDEPENDENT_AMBULATORY_CARE_PROVIDER_SITE_OTHER): Payer: Medicare Other | Admitting: Internal Medicine

## 2021-11-01 ENCOUNTER — Other Ambulatory Visit: Payer: Self-pay

## 2021-11-01 DIAGNOSIS — M19011 Primary osteoarthritis, right shoulder: Secondary | ICD-10-CM

## 2021-11-01 DIAGNOSIS — R351 Nocturia: Secondary | ICD-10-CM

## 2021-11-01 DIAGNOSIS — E538 Deficiency of other specified B group vitamins: Secondary | ICD-10-CM | POA: Diagnosis not present

## 2021-11-01 DIAGNOSIS — N401 Enlarged prostate with lower urinary tract symptoms: Secondary | ICD-10-CM

## 2021-11-01 DIAGNOSIS — I10 Essential (primary) hypertension: Secondary | ICD-10-CM | POA: Diagnosis not present

## 2021-11-01 MED ORDER — AMLODIPINE BESY-BENAZEPRIL HCL 5-20 MG PO CAPS: ORAL_CAPSULE | ORAL | 3 refills | Status: DC

## 2021-11-01 MED ORDER — TAMSULOSIN HCL 0.4 MG PO CAPS: 0.4000 mg | ORAL_CAPSULE | Freq: Every day | ORAL | 3 refills | Status: DC

## 2021-11-01 NOTE — Assessment & Plan Note (Signed)
Cont w/Vit B12 1000 mcg/d po ?

## 2021-11-01 NOTE — Progress Notes (Signed)
? ?Subjective:  ?Patient ID: Eric [name], male    DOB: 10-11-1940  Age: 81 y.o. MRN: [id number] ? ?CC: Follow-up (No concerns) ? ? ?HPI ?Eric [name] presents for HTN, OA, BpH ? ?Outpatient Medications Prior to Visit  ?Medication Sig Dispense Refill  ? aspirin 81 MG EC tablet TAKE 1 TABLET BY MOUTH EVERY DAY 90 tablet 1  ? Cholecalciferol ([facility] D3) 50 MCG (2000 UT) CAPS Take 1 capsule (2,000 Units total) by mouth daily. 100 capsule 3  ? Cyanocobalamin (VITAMIN B-12) 1000 MCG SUBL Place 1 tablet (1,000 mcg total) under the tongue daily. 100 tablet 3  ? finasteride (PROSCAR) 5 MG tablet Take 1 tablet (5 mg total) by mouth daily. 90 tablet 3  ? gabapentin (NEURONTIN) 300 MG capsule TAKE 1 CAP TWO TIMES DAILY FOR 2 WEEKS THEN INCREASE TO 3 TIMES DAILY 270 capsule 3  ? traMADol (ULTRAM) 50 MG tablet Take 1 tablet (50 mg total) by mouth 4 (four) times daily as needed. TAKE 1 TABLET BY MOUTH THREE TIMES A DAY AS NEEDED SEVERE PAIN 120 tablet 3  ? amLODipine-benazepril (LOTREL) 5-20 MG capsule TAKE 1 CAPSULE BY MOUTH EVERY DAY 90 capsule 3  ? oxybutynin (DITROPAN-XL) 5 MG 24 hr tablet TAKE 1 TABLET (5 MG TOTAL) BY MOUTH AT BEDTIME. FOR FREQUENT URINATION 90 tablet 3  ? tamsulosin (FLOMAX) 0.4 MG CAPS capsule Take 1 capsule (0.4 mg total) by mouth daily. 90 capsule 3  ? ?No facility-administered medications prior to visit.  ? ? ?ROS: ?Review of Systems  ?Constitutional:  Negative for appetite change, fatigue and unexpected weight change.  ?HENT:  Negative for congestion, nosebleeds, sneezing, sore throat and trouble swallowing.   ?Eyes:  Negative for itching and visual disturbance.  ?Respiratory:  Negative for cough.   ?Cardiovascular:  Negative for chest pain, palpitations and leg swelling.  ?Gastrointestinal:  Negative for abdominal distention, blood in stool, diarrhea and nausea.  ?Genitourinary:  Positive for frequency. Negative for hematuria.  ?Musculoskeletal:  Positive for arthralgias, back pain and gait  problem. Negative for joint swelling and neck pain.  ?Skin:  Negative for rash.  ?Neurological:  Negative for dizziness, tremors, speech difficulty and weakness.  ?Psychiatric/Behavioral:  Negative for agitation, dysphoric mood and sleep disturbance. The patient is not nervous/anxious.   ? ?Objective:  ?BP (!) 168/84 (BP Location: Left Arm, Patient Position: Sitting, Cuff Size: Normal)   Pulse 95   Temp 98.2 ?F (36.8 ?C) (Oral)   Ht 5\' 7"  (1.702 m)   Wt 152 lb 3.2 oz (69 kg)   SpO2 97%   BMI 23.84 kg/m?  ? ?BP Readings from Last 3 Encounters:  ?11/01/21 (!) 168/84  ?08/02/21 (!) 158/88  ?04/24/21 (!) 142/78  ? ? ?Wt Readings from Last 3 Encounters:  ?11/01/21 152 lb 3.2 oz (69 kg)  ?08/02/21 152 lb 9.6 oz (69.2 kg)  ?04/24/21 152 lb 3.2 oz (69 kg)  ? ? ?Physical Exam ?Constitutional:   ?   General: He is not in acute distress. ?   Appearance: Normal appearance. He is well-developed.  ?   Comments: NAD  ?Eyes:  ?   Conjunctiva/sclera: Conjunctivae normal.  ?   Pupils: Pupils are equal, round, and reactive to light.  ?Neck:  ?   Thyroid: No thyromegaly.  ?   Vascular: No JVD.  ?Cardiovascular:  ?   Rate and Rhythm: Normal rate and regular rhythm.  ?   Heart sounds: Normal heart sounds. No murmur heard. ?  No  friction rub. No gallop.  ?Pulmonary:  ?   Effort: Pulmonary effort is normal. No respiratory distress.  ?   Breath sounds: Normal breath sounds. No wheezing or rales.  ?Chest:  ?   Chest wall: No tenderness.  ?Abdominal:  ?   General: Bowel sounds are normal. There is no distension.  ?   Palpations: Abdomen is soft. There is no mass.  ?   Tenderness: There is no abdominal tenderness. There is no guarding or rebound.  ?Musculoskeletal:     ?   General: Tenderness present. Normal range of motion.  ?   Cervical back: Normal range of motion.  ?   Right lower leg: No edema.  ?   Left lower leg: No edema.  ?Lymphadenopathy:  ?   Cervical: No cervical adenopathy.  ?Skin: ?   General: Skin is warm and dry.  ?    Findings: No rash.  ?Neurological:  ?   Mental Status: He is alert and oriented to person, place, and time.  ?   Cranial Nerves: No cranial nerve deficit.  ?   Motor: No abnormal muscle tone.  ?   Coordination: Coordination normal.  ?   Gait: Gait abnormal.  ?   Deep Tendon Reflexes: Reflexes are normal and symmetric.  ?Psychiatric:     ?   Behavior: Behavior normal.     ?   Thought Content: Thought content normal.     ?   Judgment: Judgment normal.  ?Using a cane ?LS - stiff ? ?Lab Results  ?Component Value Date  ? WBC 8.5 08/21/2020  ? HGB 10.5 (L) 08/21/2020  ? HCT 32.7 (L) 08/21/2020  ? PLT 432.0 (H) 08/21/2020  ? GLUCOSE 103 (H) 08/21/2020  ? CHOL 189 08/21/2020  ? TRIG 74.0 08/21/2020  ? HDL 70.70 08/21/2020  ? LDLDIRECT 163.3 05/26/2008  ? LDLCALC 104 (H) 08/21/2020  ? ALT 9 08/21/2020  ? AST 12 08/21/2020  ? NA 140 08/21/2020  ? K 4.3 08/21/2020  ? CL 104 08/21/2020  ? CREATININE 0.87 08/21/2020  ? BUN 9 08/21/2020  ? CO2 30 08/21/2020  ? TSH 0.90 08/21/2020  ? PSA 0.14 08/21/2020  ? INR 1.12 07/20/2013  ? ? ?US Renal ? ?Result Date: 10/05/2020 ?CLINICAL DATA:  UTI in September 2021 EXAM: RENAL / URINARY TRACT ULTRASOUND COMPLETE COMPARISON:  None. FINDINGS: Right Kidney: Renal measurements: 10.4 x 5.7 x 4.3 cm = volume: 133 mL. Echogenicity within normal limits. No mass or hydronephrosis visualized. Left Kidney: Renal measurements: 9.4 x 4.9 x 4.0 cm = volume: 95 mL. Echogenicity within normal limits. No mass or hydronephrosis visualized. Bladder: Mild diffuse thickening of the bladder wall most likely due to under distension. Chronic cystitis and outlet obstruction can have a similar appearance. Other: Nodular prostate impressing upon the bladder neck. IMPRESSION: 1. No significant abnormality of the kidneys. 2. Diffuse bladder wall thickening likely due to underdistention, however are chronic cystitis or outlet obstruction can have a similar appearance. 3. Nodular prostate impressing upon the bladder neck.  Electronically Signed   By: Acquanetta Belling M.D.   On: 10/05/2020 08:49  ? ? ?Assessment & Plan:  ? ?Problem List Items Addressed This Visit   ? ? B12 deficiency  ?  Cont w/Vit B12 1000 mcg/d po ?  ?  ? BPH associated with nocturia  ?  Cont on Proscar, Flomax  ?  ?  ? Essential hypertension  ?  Cont on Lotrel ?BP nl at home per pt ?  ?  ?  Relevant Medications  ? amLODipine-benazepril (LOTREL) 5-20 MG capsule  ? Osteoarthritis  ?  Continue with tramadol prn ? Potential benefits of a long term opioids use as well as potential risks (i.e. addiction risk, apnea etc) and complications (i.e. Somnolence, constipation and others) were explained to the patient and were aknowledged. ?  ?  ?  ? ? ?Meds ordered this encounter  ?Medications  ? tamsulosin (FLOMAX) 0.4 MG CAPS capsule  ?  Sig: Take 1 capsule (0.4 mg total) by mouth daily.  ?  Dispense:  90 capsule  ?  Refill:  3  ? amLODipine-benazepril (LOTREL) 5-20 MG capsule  ?  Sig: TAKE 1 CAPSULE BY MOUTH EVERY DAY  ?  Dispense:  90 capsule  ?  Refill:  3  ?  ? ? ?Follow-up: No follow-ups on file. ? ?Sonda Primes, MD ?

## 2021-11-01 NOTE — Assessment & Plan Note (Signed)
Cont on Proscar, Flomax  ?

## 2021-11-01 NOTE — Assessment & Plan Note (Signed)
Continue with tramadol prn  Potential benefits of a long term opioids use as well as potential risks (i.e. addiction risk, apnea etc) and complications (i.e. Somnolence, constipation and others) were explained to the patient and were aknowledged. 

## 2021-11-01 NOTE — Assessment & Plan Note (Signed)
Cont on Lotrel BP nl at home per pt 

## 2021-11-30 ENCOUNTER — Other Ambulatory Visit: Payer: Self-pay | Admitting: Internal Medicine

## 2021-12-03 ENCOUNTER — Telehealth: Payer: Self-pay

## 2021-12-03 NOTE — Telephone Encounter (Signed)
Pt daughter is requesting a refill on pt behalf ?traMADol (ULTRAM) 50 MG tablet ? ?Pharmacy: [id number] [place] ? ?LOV 11/01/21 ?ROV 05/09/22 ?

## 2021-12-03 NOTE — Telephone Encounter (Signed)
LOV: 11/01/21 ?

## 2021-12-06 NOTE — Telephone Encounter (Signed)
It is done.  Thanks 

## 2021-12-11 ENCOUNTER — Other Ambulatory Visit: Payer: Self-pay | Admitting: Internal Medicine

## 2021-12-11 NOTE — Telephone Encounter (Signed)
Per chart The original prescription was discontinued on 11/01/2021 by Plotnikov, Evie Lacks, MD for the following reason: Patient Preference. Should pt be taking med.Marland KitchenJohny Chess ?

## 2021-12-21 ENCOUNTER — Other Ambulatory Visit: Payer: Self-pay | Admitting: Internal Medicine

## 2021-12-21 MED ORDER — TRAMADOL HCL 50 MG PO TABS: ORAL_TABLET | ORAL | 3 refills | Status: DC

## 2021-12-21 NOTE — Progress Notes (Signed)
Rx corrected.

## 2022-05-09 ENCOUNTER — Ambulatory Visit: Payer: Medicare Other | Admitting: Internal Medicine

## 2022-05-14 ENCOUNTER — Ambulatory Visit: Payer: Medicare Other | Admitting: Internal Medicine

## 2022-06-11 ENCOUNTER — Other Ambulatory Visit: Payer: Self-pay | Admitting: Internal Medicine

## 2022-06-12 DIAGNOSIS — M545 Low back pain, unspecified: Secondary | ICD-10-CM | POA: Diagnosis not present

## 2022-06-14 ENCOUNTER — Telehealth: Payer: Self-pay | Admitting: *Deleted

## 2022-06-14 MED ORDER — TRAMADOL HCL 50 MG PO TABS: ORAL_TABLET | ORAL | 0 refills | Status: DC

## 2022-06-14 NOTE — Telephone Encounter (Signed)
Rec'd fax needing script clarification on pt Tramadol. Script that was sent in has two directions. Per OV MD wrote for him to take 1 three times a day for severe pain.[facility] spoke [facility] Adonis Huguenin inform directions is 1 three times day. She states she will have to take a new order. Updated pt med list../l,b

## 2022-06-26 ENCOUNTER — Encounter: Payer: Self-pay | Admitting: Internal Medicine

## 2022-06-26 ENCOUNTER — Ambulatory Visit (INDEPENDENT_AMBULATORY_CARE_PROVIDER_SITE_OTHER): Payer: Medicare Other | Admitting: Internal Medicine

## 2022-06-26 VITALS — BP 140/78 | HR 87 | Temp 98.7°F | Ht 67.0 in | Wt 138.8 lb

## 2022-06-26 DIAGNOSIS — I611 Nontraumatic intracerebral hemorrhage in hemisphere, cortical: Secondary | ICD-10-CM

## 2022-06-26 DIAGNOSIS — E559 Vitamin D deficiency, unspecified: Secondary | ICD-10-CM | POA: Diagnosis not present

## 2022-06-26 DIAGNOSIS — I10 Essential (primary) hypertension: Secondary | ICD-10-CM

## 2022-06-26 DIAGNOSIS — M19011 Primary osteoarthritis, right shoulder: Secondary | ICD-10-CM

## 2022-06-26 DIAGNOSIS — Z0000 Encounter for general adult medical examination without abnormal findings: Secondary | ICD-10-CM

## 2022-06-26 DIAGNOSIS — N401 Enlarged prostate with lower urinary tract symptoms: Secondary | ICD-10-CM

## 2022-06-26 DIAGNOSIS — E538 Deficiency of other specified B group vitamins: Secondary | ICD-10-CM | POA: Diagnosis not present

## 2022-06-26 DIAGNOSIS — R351 Nocturia: Secondary | ICD-10-CM

## 2022-06-26 DIAGNOSIS — Z23 Encounter for immunization: Secondary | ICD-10-CM | POA: Diagnosis not present

## 2022-06-26 LAB — PSA: PSA: 0.42 ng/mL (ref 0.10–4.00)

## 2022-06-26 LAB — URINALYSIS
Bilirubin Urine: NEGATIVE
Hgb urine dipstick: NEGATIVE
Ketones, ur: NEGATIVE
Leukocytes,Ua: NEGATIVE
Nitrite: NEGATIVE
Specific Gravity, Urine: 1.01 (ref 1.000–1.030)
Total Protein, Urine: NEGATIVE
Urine Glucose: NEGATIVE
Urobilinogen, UA: 0.2 (ref 0.0–1.0)
pH: 6 (ref 5.0–8.0)

## 2022-06-26 LAB — CBC WITH DIFFERENTIAL/PLATELET
Basophils Absolute: 0.1 10*3/uL (ref 0.0–0.1)
Basophils Relative: 1.5 % (ref 0.0–3.0)
Eosinophils Absolute: 0 10*3/uL (ref 0.0–0.7)
Eosinophils Relative: 0.4 % (ref 0.0–5.0)
HCT: 33.9 % — ABNORMAL LOW (ref 39.0–52.0)
Hemoglobin: 10.7 g/dL — ABNORMAL LOW (ref 13.0–17.0)
Lymphocytes Relative: 17.2 % (ref 12.0–46.0)
Lymphs Abs: 1.4 10*3/uL (ref 0.7–4.0)
MCHC: 31.5 g/dL (ref 30.0–36.0)
MCV: 70.6 fl — ABNORMAL LOW (ref 78.0–100.0)
Monocytes Absolute: 0.5 10*3/uL (ref 0.1–1.0)
Monocytes Relative: 5.7 % (ref 3.0–12.0)
Neutro Abs: 6.2 10*3/uL (ref 1.4–7.7)
Neutrophils Relative %: 75.2 % (ref 43.0–77.0)
Platelets: 444 10*3/uL — ABNORMAL HIGH (ref 150.0–400.0)
RBC: 4.8 Mil/uL (ref 4.22–5.81)
RDW: 19.9 % — ABNORMAL HIGH (ref 11.5–15.5)
WBC: 8.2 10*3/uL (ref 4.0–10.5)

## 2022-06-26 LAB — VITAMIN D 25 HYDROXY (VIT D DEFICIENCY, FRACTURES): VITD: 43.85 ng/mL (ref 30.00–100.00)

## 2022-06-26 LAB — TSH: TSH: 1.01 u[IU]/mL (ref 0.35–5.50)

## 2022-06-26 LAB — VITAMIN B12: Vitamin B-12: >1500 pg/mL — ABNORMAL HIGH (ref 211–911)

## 2022-06-26 MED ORDER — TRAMADOL HCL 50 MG PO TABS: ORAL_TABLET | ORAL | 3 refills | Status: DC

## 2022-06-26 MED ORDER — FINASTERIDE 5 MG PO TABS: 5.0000 mg | ORAL_TABLET | Freq: Every day | ORAL | 3 refills | Status: DC

## 2022-06-26 MED ORDER — TAMSULOSIN HCL 0.4 MG PO CAPS: 0.4000 mg | ORAL_CAPSULE | Freq: Every day | ORAL | 3 refills | Status: DC

## 2022-06-26 NOTE — Assessment & Plan Note (Signed)
Cont on B12 ?

## 2022-06-26 NOTE — Assessment & Plan Note (Signed)
Cont on Vit D 

## 2022-06-26 NOTE — Assessment & Plan Note (Signed)
Continue with tramadol prn  Potential benefits of a long term opioids use as well as potential risks (i.e. addiction risk, apnea etc) and complications (i.e. Somnolence, constipation and others) were explained to the patient and were aknowledged.

## 2022-06-26 NOTE — Assessment & Plan Note (Signed)
Using a cane Cont on Lotrel, baby ASA daily

## 2022-06-26 NOTE — Assessment & Plan Note (Signed)
Cont on Lotrel BP nl at home per pt

## 2022-06-26 NOTE — Progress Notes (Signed)
Subjective:  Patient ID: Eric [name], male    DOB: September 06, 1940  Age: 81 y.o. MRN: [id number]  CC: Follow-up (6 month f/u- Flu shot)   HPI Eric [name] presents for chronic pain, OA, HTN  Outpatient Medications Prior to Visit  Medication Sig Dispense Refill   amLODipine-benazepril (LOTREL) 5-20 MG capsule TAKE 1 CAPSULE BY MOUTH EVERY DAY 90 capsule 3   aspirin 81 MG EC tablet TAKE 1 TABLET BY MOUTH EVERY DAY 90 tablet 1   Cholecalciferol ([facility]S D3) 50 MCG (2000 UT) CAPS Take 1 capsule (2,000 Units total) by mouth daily. 100 capsule 3   Cyanocobalamin (VITAMIN B-12) 1000 MCG SUBL Place 1 tablet (1,000 mcg total) under the tongue daily. 100 tablet 3   oxybutynin (DITROPAN-XL) 5 MG 24 hr tablet TAKE 1 TABLET (5 MG TOTAL) BY MOUTH AT BEDTIME. FOR FREQUENT URINATION 90 tablet 3   finasteride (PROSCAR) 5 MG tablet Take 1 tablet (5 mg total) by mouth daily. 90 tablet 3   gabapentin (NEURONTIN) 300 MG capsule TAKE 1 CAP TWO TIMES DAILY FOR 2 WEEKS THEN INCREASE TO 3 TIMES DAILY 270 capsule 3   tamsulosin (FLOMAX) 0.4 MG CAPS capsule Take 1 capsule (0.4 mg total) by mouth daily. 90 capsule 3   traMADol (ULTRAM) 50 MG tablet TAKE 1 TABLET 3 TIMES A DAY AS NEEDED SEVERE PAIN 60 tablet 0   No facility-administered medications prior to visit.    ROS: Review of Systems  Constitutional:  Positive for fatigue. Negative for appetite change and unexpected weight change.  HENT:  Negative for congestion, nosebleeds, sneezing, sore throat and trouble swallowing.   Eyes:  Negative for itching and visual disturbance.  Respiratory:  Negative for cough.   Cardiovascular:  Negative for chest pain, palpitations and leg swelling.  Gastrointestinal:  Negative for abdominal distention, blood in stool, diarrhea and nausea.  Genitourinary:  Negative for frequency and hematuria.  Musculoskeletal:  Positive for arthralgias, back pain, gait problem and neck stiffness. Negative for joint swelling and neck  pain.  Skin:  Negative for rash.  Neurological:  Negative for dizziness, tremors, speech difficulty and weakness.  Psychiatric/Behavioral:  Negative for agitation, dysphoric mood, sleep disturbance and suicidal ideas. The patient is not nervous/anxious.     Objective:  BP (!) 140/78 (BP Location: Left Arm)   Pulse 87   Temp 98.7 F (37.1 C) (Oral)   Ht 5\' 7"  (1.702 m)   Wt 138 lb 12.8 oz (63 kg)   SpO2 98%   BMI 21.74 kg/m   BP Readings from Last 3 Encounters:  06/26/22 (!) 140/78  11/01/21 (!) 168/84  08/02/21 (!) 158/88    Wt Readings from Last 3 Encounters:  06/26/22 138 lb 12.8 oz (63 kg)  11/01/21 152 lb 3.2 oz (69 kg)  08/02/21 152 lb 9.6 oz (69.2 kg)    Physical Exam Constitutional:      General: He is not in acute distress.    Appearance: Normal appearance. He is well-developed.     Comments: NAD  Eyes:     Conjunctiva/sclera: Conjunctivae normal.     Pupils: Pupils are equal, round, and reactive to light.  Neck:     Thyroid: No thyromegaly.     Vascular: No JVD.  Cardiovascular:     Rate and Rhythm: Normal rate and regular rhythm.     Heart sounds: Normal heart sounds. No murmur heard.    No friction rub. No gallop.  Pulmonary:     Effort:  Pulmonary effort is normal. No respiratory distress.     Breath sounds: Normal breath sounds. No wheezing or rales.  Chest:     Chest wall: No tenderness.  Abdominal:     General: Bowel sounds are normal. There is no distension.     Palpations: Abdomen is soft. There is no mass.     Tenderness: There is no abdominal tenderness. There is no guarding or rebound.  Musculoskeletal:        General: No tenderness. Normal range of motion.     Cervical back: Normal range of motion.     Right lower leg: No edema.     Left lower leg: No edema.  Lymphadenopathy:     Cervical: No cervical adenopathy.  Skin:    General: Skin is warm and dry.     Findings: No rash.  Neurological:     Mental Status: He is alert and oriented  to person, place, and time.     Cranial Nerves: No cranial nerve deficit.     Motor: Weakness present. No abnormal muscle tone.     Coordination: Coordination abnormal.     Gait: Gait abnormal.     Deep Tendon Reflexes: Reflexes are normal and symmetric.  Psychiatric:        Behavior: Behavior normal.        Thought Content: Thought content normal.        Judgment: Judgment normal.    Using a cane  Lab Results  Component Value Date   WBC 8.5 08/21/2020   HGB 10.5 (L) 08/21/2020   HCT 32.7 (L) 08/21/2020   PLT 432.0 (H) 08/21/2020   GLUCOSE 103 (H) 08/21/2020   CHOL 189 08/21/2020   TRIG 74.0 08/21/2020   HDL 70.70 08/21/2020   LDLDIRECT 163.3 05/26/2008   LDLCALC 104 (H) 08/21/2020   ALT 9 08/21/2020   AST 12 08/21/2020   NA 140 08/21/2020   K 4.3 08/21/2020   CL 104 08/21/2020   CREATININE 0.87 08/21/2020   BUN 9 08/21/2020   CO2 30 08/21/2020   TSH 0.90 08/21/2020   PSA 0.14 08/21/2020   INR 1.12 07/20/2013    US Renal  Result Date: 10/05/2020 CLINICAL DATA:  UTI in September 2021 EXAM: RENAL / URINARY TRACT ULTRASOUND COMPLETE COMPARISON:  None. FINDINGS: Right Kidney: Renal measurements: 10.4 x 5.7 x 4.3 cm = volume: 133 mL. Echogenicity within normal limits. No mass or hydronephrosis visualized. Left Kidney: Renal measurements: 9.4 x 4.9 x 4.0 cm = volume: 95 mL. Echogenicity within normal limits. No mass or hydronephrosis visualized. Bladder: Mild diffuse thickening of the bladder wall most likely due to under distension. Chronic cystitis and outlet obstruction can have a similar appearance. Other: Nodular prostate impressing upon the bladder neck. IMPRESSION: 1. No significant abnormality of the kidneys. 2. Diffuse bladder wall thickening likely due to underdistention, however are chronic cystitis or outlet obstruction can have a similar appearance. 3. Nodular prostate impressing upon the bladder neck. Electronically Signed   By: Acquanetta Belling M.D.   On: 10/05/2020 08:49     Assessment & Plan:   Problem List Items Addressed This Visit     B12 deficiency    Cont on B12      BPH associated with nocturia     Cont on Proscar and Flomax      Essential hypertension    Cont on Lotrel BP nl at home per pt      ICH (intracerebral hemorrhage) (HCC)  Using a cane Cont on Lotrel, baby ASA daily      Osteoarthritis    Continue with tramadol prn  Potential benefits of a long term opioids use as well as potential risks (i.e. addiction risk, apnea etc) and complications (i.e. Somnolence, constipation and others) were explained to the patient and were aknowledged.      Relevant Medications   traMADol (ULTRAM) 50 MG tablet   Vitamin D deficiency    Cont on Vit D      Other Visit Diagnoses     Needs flu shot    -  Primary   Relevant Orders   Flu Vaccine QUAD High Dose(Fluad) (Completed)         Meds ordered this encounter  Medications   traMADol (ULTRAM) 50 MG tablet    Sig: TAKE 1 TABLET 3 TIMES A DAY AS NEEDED SEVERE PAIN    Dispense:  90 tablet    Refill:  3   tamsulosin (FLOMAX) 0.4 MG CAPS capsule    Sig: Take 1 capsule (0.4 mg total) by mouth daily.    Dispense:  90 capsule    Refill:  3   finasteride (PROSCAR) 5 MG tablet    Sig: Take 1 tablet (5 mg total) by mouth daily.    Dispense:  90 tablet    Refill:  3      Follow-up: No follow-ups on file.  Sonda Primes, MD

## 2022-06-26 NOTE — Assessment & Plan Note (Signed)
Cont on Proscar and Flomax

## 2022-06-28 ENCOUNTER — Other Ambulatory Visit: Payer: Self-pay

## 2022-06-28 ENCOUNTER — Other Ambulatory Visit (INDEPENDENT_AMBULATORY_CARE_PROVIDER_SITE_OTHER): Payer: Medicare Other

## 2022-06-28 DIAGNOSIS — E785 Hyperlipidemia, unspecified: Secondary | ICD-10-CM

## 2022-06-28 DIAGNOSIS — R42 Dizziness and giddiness: Secondary | ICD-10-CM

## 2022-06-28 DIAGNOSIS — I10 Essential (primary) hypertension: Secondary | ICD-10-CM | POA: Diagnosis not present

## 2022-06-28 DIAGNOSIS — N401 Enlarged prostate with lower urinary tract symptoms: Secondary | ICD-10-CM

## 2022-06-28 DIAGNOSIS — E559 Vitamin D deficiency, unspecified: Secondary | ICD-10-CM

## 2022-06-28 DIAGNOSIS — E538 Deficiency of other specified B group vitamins: Secondary | ICD-10-CM

## 2022-06-28 LAB — COMPREHENSIVE METABOLIC PANEL
ALT: 12 U/L (ref 0–53)
AST: 11 U/L (ref 0–37)
Albumin: 3.9 g/dL (ref 3.5–5.2)
Alkaline Phosphatase: 64 U/L (ref 39–117)
BUN: 8 mg/dL (ref 6–23)
CO2: 30 mEq/L (ref 19–32)
Calcium: 9.3 mg/dL (ref 8.4–10.5)
Chloride: 103 mEq/L (ref 96–112)
Creatinine, Ser: 0.81 mg/dL (ref 0.40–1.50)
GFR: 82.65 mL/min (ref >60.00)
Glucose, Bld: 99 mg/dL (ref 70–99)
Potassium: 4 mEq/L (ref 3.5–5.1)
Sodium: 138 mEq/L (ref 135–145)
Total Bilirubin: 0.4 mg/dL (ref 0.2–1.2)
Total Protein: 6.6 g/dL (ref 6.0–8.3)

## 2022-06-28 LAB — LIPID PANEL
Cholesterol: 156 mg/dL (ref 0–200)
HDL: 53 mg/dL (ref >39.00)
LDL Cholesterol: 85 mg/dL (ref 0–99)
NonHDL: 103.19
Total CHOL/HDL Ratio: 3
Triglycerides: 93 mg/dL (ref 0.0–149.0)
VLDL: 18.6 mg/dL (ref 0.0–40.0)

## 2022-06-28 NOTE — Progress Notes (Signed)
[facility] called, orders were released instead of future dated. Re-entered labs.

## 2022-10-18 ENCOUNTER — Telehealth: Payer: Self-pay | Admitting: Internal Medicine

## 2022-10-18 NOTE — Telephone Encounter (Signed)
Pt spouse called in requesting for    Rx  traMADol (ULTRAM) 50 MG tablet   Please call back pt with update.

## 2022-10-20 MED ORDER — TRAMADOL HCL 50 MG PO TABS: ORAL_TABLET | ORAL | 3 refills | Status: DC

## 2022-10-20 NOTE — Telephone Encounter (Signed)
Okay.  Thanks.

## 2022-10-28 ENCOUNTER — Other Ambulatory Visit: Payer: Self-pay | Admitting: Internal Medicine

## 2022-10-29 ENCOUNTER — Other Ambulatory Visit: Payer: Self-pay | Admitting: Internal Medicine

## 2022-11-12 DIAGNOSIS — H5703 Miosis: Secondary | ICD-10-CM | POA: Diagnosis not present

## 2022-11-12 DIAGNOSIS — H26491 Other secondary cataract, right eye: Secondary | ICD-10-CM | POA: Diagnosis not present

## 2022-11-12 DIAGNOSIS — H2181 Floppy iris syndrome: Secondary | ICD-10-CM | POA: Diagnosis not present

## 2022-11-12 DIAGNOSIS — H35371 Puckering of macula, right eye: Secondary | ICD-10-CM | POA: Diagnosis not present

## 2022-11-12 DIAGNOSIS — Z961 Presence of intraocular lens: Secondary | ICD-10-CM | POA: Diagnosis not present

## 2022-11-12 DIAGNOSIS — H53462 Homonymous bilateral field defects, left side: Secondary | ICD-10-CM | POA: Diagnosis not present

## 2022-11-12 DIAGNOSIS — H5010 Unspecified exotropia: Secondary | ICD-10-CM | POA: Diagnosis not present

## 2022-11-12 DIAGNOSIS — H353111 Nonexudative age-related macular degeneration, right eye, early dry stage: Secondary | ICD-10-CM | POA: Diagnosis not present

## 2022-12-25 ENCOUNTER — Telehealth: Payer: Self-pay | Admitting: Internal Medicine

## 2022-12-25 NOTE — Telephone Encounter (Signed)
Contacted [name] to schedule their annual wellness visit. Appointment made for 12/31/2022.  [facility] Care Guide [facility] TEAM Direct Dial: [phone]

## 2022-12-26 ENCOUNTER — Ambulatory Visit (INDEPENDENT_AMBULATORY_CARE_PROVIDER_SITE_OTHER): Payer: Medicare Other

## 2022-12-26 ENCOUNTER — Encounter: Payer: Self-pay | Admitting: Internal Medicine

## 2022-12-26 ENCOUNTER — Ambulatory Visit (INDEPENDENT_AMBULATORY_CARE_PROVIDER_SITE_OTHER): Payer: Medicare Other | Admitting: Internal Medicine

## 2022-12-26 ENCOUNTER — Ambulatory Visit: Payer: Medicare Other | Admitting: Internal Medicine

## 2022-12-26 VITALS — BP 138/70 | HR 93 | Temp 98.4°F | Ht 67.0 in | Wt 147.0 lb

## 2022-12-26 DIAGNOSIS — I611 Nontraumatic intracerebral hemorrhage in hemisphere, cortical: Secondary | ICD-10-CM | POA: Diagnosis not present

## 2022-12-26 DIAGNOSIS — N401 Enlarged prostate with lower urinary tract symptoms: Secondary | ICD-10-CM

## 2022-12-26 DIAGNOSIS — M545 Low back pain, unspecified: Secondary | ICD-10-CM

## 2022-12-26 DIAGNOSIS — R351 Nocturia: Secondary | ICD-10-CM

## 2022-12-26 DIAGNOSIS — H538 Other visual disturbances: Secondary | ICD-10-CM

## 2022-12-26 DIAGNOSIS — G8929 Other chronic pain: Secondary | ICD-10-CM

## 2022-12-26 DIAGNOSIS — I10 Essential (primary) hypertension: Secondary | ICD-10-CM | POA: Diagnosis not present

## 2022-12-26 DIAGNOSIS — D509 Iron deficiency anemia, unspecified: Secondary | ICD-10-CM | POA: Diagnosis not present

## 2022-12-26 DIAGNOSIS — E538 Deficiency of other specified B group vitamins: Secondary | ICD-10-CM

## 2022-12-26 NOTE — Assessment & Plan Note (Signed)
Cont on Proscar and Flomax 

## 2022-12-26 NOTE — Progress Notes (Addendum)
Subjective:  Patient ID: Eric [name], male    DOB: April 01, 1941  Age: 82 y.o. MRN: [id number]  CC: No chief complaint on file.   HPI JEBB AUKAMP presents for LBP, HTN, B12 def, h/o CVA He lives w/wife She is here with his daughter [name]  Outpatient Medications Prior to Visit  Medication Sig Dispense Refill   amLODipine-benazepril (LOTREL) 5-20 MG capsule TAKE 1 CAPSULE BY MOUTH EVERY DAY 90 capsule 3   aspirin 81 MG EC tablet TAKE 1 TABLET BY MOUTH EVERY DAY 90 tablet 1   Cholecalciferol ([facility] D3) 50 MCG (2000 UT) CAPS Take 1 capsule (2,000 Units total) by mouth daily. 100 capsule 3   Cyanocobalamin (VITAMIN B-12) 1000 MCG SUBL Place 1 tablet (1,000 mcg total) under the tongue daily. 100 tablet 3   finasteride (PROSCAR) 5 MG tablet Take 1 tablet (5 mg total) by mouth daily. 90 tablet 3   oxybutynin (DITROPAN-XL) 5 MG 24 hr tablet TAKE 1 TABLET (5 MG TOTAL) BY MOUTH AT BEDTIME. FOR FREQUENT URINATION 90 tablet 2   traMADol (ULTRAM) 50 MG tablet TAKE 1 TABLET 3 TIMES A DAY AS NEEDED SEVERE PAIN 90 tablet 3   tamsulosin (FLOMAX) 0.4 MG CAPS capsule Take 1 capsule (0.4 mg total) by mouth daily. (Patient not taking: Reported on 12/26/2022) 90 capsule 3   No facility-administered medications prior to visit.    ROS: Review of Systems  Constitutional:  Positive for fatigue. Negative for appetite change and unexpected weight change.  HENT:  Negative for congestion, nosebleeds, sneezing, sore throat and trouble swallowing.   Eyes:  Positive for visual disturbance. Negative for itching.  Respiratory:  Negative for cough.   Cardiovascular:  Negative for chest pain, palpitations and leg swelling.  Gastrointestinal:  Negative for abdominal distention, blood in stool, diarrhea and nausea.  Genitourinary:  Negative for frequency and hematuria.  Musculoskeletal:  Positive for arthralgias, back pain and gait problem. Negative for joint swelling and neck pain.  Skin:  Negative for rash.   Neurological:  Positive for weakness. Negative for dizziness, tremors, speech difficulty and light-headedness.  Psychiatric/Behavioral:  Negative for agitation, dysphoric mood, sleep disturbance and suicidal ideas. The patient is not nervous/anxious.     Objective:  BP 138/70 (BP Location: Left Arm, Patient Position: Sitting, Cuff Size: Normal)   Pulse 93   Temp 98.4 F (36.9 C) (Oral)   Ht 5\' 7"  (1.702 m)   Wt 147 lb (66.7 kg)   SpO2 97%   BMI 23.02 kg/m   BP Readings from Last 3 Encounters:  12/26/22 138/70  06/26/22 (!) 140/78  11/01/21 (!) 168/84    Wt Readings from Last 3 Encounters:  12/26/22 147 lb (66.7 kg)  06/26/22 138 lb 12.8 oz (63 kg)  11/01/21 152 lb 3.2 oz (69 kg)    Physical Exam Constitutional:      General: He is not in acute distress.    Appearance: He is well-developed. He is not toxic-appearing.     Comments: NAD  Eyes:     Conjunctiva/sclera: Conjunctivae normal.     Pupils: Pupils are equal, round, and reactive to light.  Neck:     Thyroid: No thyromegaly.     Vascular: No JVD.  Cardiovascular:     Rate and Rhythm: Normal rate and regular rhythm.     Heart sounds: Normal heart sounds. No murmur heard.    No friction rub. No gallop.  Pulmonary:     Effort: Pulmonary effort is normal.  No respiratory distress.     Breath sounds: Normal breath sounds. No wheezing or rales.  Chest:     Chest wall: No tenderness.  Abdominal:     General: Bowel sounds are normal. There is no distension.     Palpations: Abdomen is soft. There is no mass.     Tenderness: There is no abdominal tenderness. There is no guarding or rebound.  Musculoskeletal:        General: No tenderness. Normal range of motion.     Cervical back: Normal range of motion.  Lymphadenopathy:     Cervical: No cervical adenopathy.  Skin:    General: Skin is warm and dry.     Findings: No rash.  Neurological:     Mental Status: He is alert and oriented to person, place, and time.      Cranial Nerves: No cranial nerve deficit.     Motor: No abnormal muscle tone.     Coordination: Coordination normal.     Gait: Gait normal.     Deep Tendon Reflexes: Reflexes are normal and symmetric.  Psychiatric:        Behavior: Behavior normal.        Thought Content: Thought content normal.        Judgment: Judgment normal.   Staring in space Appears chronically ill and pale Alert, cooperative  Lab Results  Component Value Date   WBC 9.0 12/26/2022   HGB 8.7 Repeated and verified X2. (L) 12/26/2022   HCT 27.6 (L) 12/26/2022   PLT 480.0 (H) 12/26/2022   GLUCOSE 82 12/26/2022   CHOL 156 06/28/2022   TRIG 93.0 06/28/2022   HDL 53.00 06/28/2022   LDLDIRECT 163.3 05/26/2008   LDLCALC 85 06/28/2022   ALT 11 12/26/2022   AST 14 12/26/2022   NA 139 12/26/2022   K 4.2 12/26/2022   CL 104 12/26/2022   CREATININE 0.97 12/26/2022   BUN 12 12/26/2022   CO2 28 12/26/2022   TSH 0.73 12/26/2022   PSA 0.42 06/26/2022   INR 1.12 07/20/2013    US Renal  Result Date: 10/05/2020 CLINICAL DATA:  UTI in September 2021 EXAM: RENAL / URINARY TRACT ULTRASOUND COMPLETE COMPARISON:  None. FINDINGS: Right Kidney: Renal measurements: 10.4 x 5.7 x 4.3 cm = volume: 133 mL. Echogenicity within normal limits. No mass or hydronephrosis visualized. Left Kidney: Renal measurements: 9.4 x 4.9 x 4.0 cm = volume: 95 mL. Echogenicity within normal limits. No mass or hydronephrosis visualized. Bladder: Mild diffuse thickening of the bladder wall most likely due to under distension. Chronic cystitis and outlet obstruction can have a similar appearance. Other: Nodular prostate impressing upon the bladder neck. IMPRESSION: 1. No significant abnormality of the kidneys. 2. Diffuse bladder wall thickening likely due to underdistention, however are chronic cystitis or outlet obstruction can have a similar appearance. 3. Nodular prostate impressing upon the bladder neck. Electronically Signed   By: Acquanetta Belling M.D.    On: 10/05/2020 08:49    Assessment & Plan:   Problem List Items Addressed This Visit     Essential hypertension    Cont on Lotrel BP nl now      Relevant Orders   Comprehensive metabolic panel (Completed)   CBC with Differential/Platelet (Completed)   Iron, TIBC and Ferritin Panel (Completed)   TSH (Completed)   Vitamin B12 (Completed)   BPH associated with nocturia     Cont on Proscar and Flomax      ICH (intracerebral hemorrhage) (HCC)  Using a cane Cont on Lotrel, baby ASA daily      Relevant Orders   Comprehensive metabolic panel (Completed)   CBC with Differential/Platelet (Completed)   Iron, TIBC and Ferritin Panel (Completed)   TSH (Completed)   Vitamin B12 (Completed)   Blurred vision    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  GI referral.      Relevant Orders   Ambulatory referral to Gastroenterology   Other Visit Diagnoses     Chronic right-sided low back pain without sciatica       Relevant Orders   DG Lumbar Spine 2-3 Views (Completed)         Meds ordered this encounter  Medications   pantoprazole (PROTONIX) 40 MG tablet    Sig: Take 1 tablet (40 mg total) by mouth daily.    Dispense:  30 tablet    Refill:  3      Follow-up: Return in about 6 months (around 06/27/2023) for Wellness Exam.  Sonda Primes, MD

## 2022-12-26 NOTE — Assessment & Plan Note (Signed)
Using a cane Cont on Lotrel, baby ASA daily 

## 2022-12-26 NOTE — Assessment & Plan Note (Signed)
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

## 2022-12-26 NOTE — Assessment & Plan Note (Signed)
Cont on Lotrel BP nl now

## 2022-12-26 NOTE — Assessment & Plan Note (Signed)
Cont on B12 ?

## 2022-12-27 LAB — COMPREHENSIVE METABOLIC PANEL
ALT: 11 U/L (ref 0–53)
AST: 14 U/L (ref 0–37)
Albumin: 3.9 g/dL (ref 3.5–5.2)
Alkaline Phosphatase: 60 U/L (ref 39–117)
BUN: 12 mg/dL (ref 6–23)
CO2: 28 mEq/L (ref 19–32)
Calcium: 9.3 mg/dL (ref 8.4–10.5)
Chloride: 104 mEq/L (ref 96–112)
Creatinine, Ser: 0.97 mg/dL (ref 0.40–1.50)
GFR: 72.92 mL/min (ref >60.00)
Glucose, Bld: 82 mg/dL (ref 70–99)
Potassium: 4.2 mEq/L (ref 3.5–5.1)
Sodium: 139 mEq/L (ref 135–145)
Total Bilirubin: 0.2 mg/dL (ref 0.2–1.2)
Total Protein: 7.2 g/dL (ref 6.0–8.3)

## 2022-12-27 LAB — CBC WITH DIFFERENTIAL/PLATELET
Basophils Absolute: 0.1 10*3/uL (ref 0.0–0.1)
Basophils Relative: 0.7 % (ref 0.0–3.0)
Eosinophils Absolute: 0 10*3/uL (ref 0.0–0.7)
Eosinophils Relative: 0.5 % (ref 0.0–5.0)
HCT: 27.6 % — ABNORMAL LOW (ref 39.0–52.0)
Hemoglobin: 8.7 g/dL — ABNORMAL LOW (ref 13.0–17.0)
Lymphocytes Relative: 20.7 % (ref 12.0–46.0)
Lymphs Abs: 1.9 10*3/uL (ref 0.7–4.0)
MCHC: 31.6 g/dL (ref 30.0–36.0)
MCV: 65.3 fl — ABNORMAL LOW (ref 78.0–100.0)
Monocytes Absolute: 0.6 10*3/uL (ref 0.1–1.0)
Monocytes Relative: 6.5 % (ref 3.0–12.0)
Neutro Abs: 6.5 10*3/uL (ref 1.4–7.7)
Neutrophils Relative %: 71.6 % (ref 43.0–77.0)
Platelets: 480 10*3/uL — ABNORMAL HIGH (ref 150.0–400.0)
RBC: 4.23 Mil/uL (ref 4.22–5.81)
RDW: 18.9 % — ABNORMAL HIGH (ref 11.5–15.5)
WBC: 9 10*3/uL (ref 4.0–10.5)

## 2022-12-27 LAB — IRON,TIBC AND FERRITIN PANEL
%SAT: 3 % (calc) — ABNORMAL LOW (ref 20–48)
Ferritin: 4 ng/mL — ABNORMAL LOW (ref 24–380)
Iron: 11 ug/dL — ABNORMAL LOW (ref 50–180)
TIBC: 409 mcg/dL (calc) (ref 250–425)

## 2022-12-27 LAB — TSH: TSH: 0.73 u[IU]/mL (ref 0.35–5.50)

## 2022-12-27 LAB — VITAMIN B12: Vitamin B-12: 1238 pg/mL — ABNORMAL HIGH (ref 211–911)

## 2022-12-29 ENCOUNTER — Other Ambulatory Visit: Payer: Self-pay | Admitting: Internal Medicine

## 2022-12-29 MED ORDER — FERROUS SULFATE 325 (65 FE) MG PO TABS: 325.0000 mg | ORAL_TABLET | Freq: Two times a day (BID) | ORAL | 2 refills | Status: DC

## 2023-01-02 DIAGNOSIS — D649 Anemia, unspecified: Secondary | ICD-10-CM | POA: Insufficient documentation

## 2023-01-02 MED ORDER — PANTOPRAZOLE SODIUM 40 MG PO TBEC
40.0000 mg | DELAYED_RELEASE_TABLET | Freq: Every day | ORAL | 3 refills | Status: DC
Start: 2023-01-02 — End: 2023-02-24

## 2023-01-02 NOTE — Addendum Note (Signed)
Addended by: Tresa Garter on: 01/02/2023 07:45 AM   Modules accepted: Orders

## 2023-01-02 NOTE — Addendum Note (Signed)
Addended by: Tresa Garter on: 01/02/2023 07:43 AM   Modules accepted: Orders, Level of Service

## 2023-01-02 NOTE — Assessment & Plan Note (Signed)
Start on oral iron.  GI referral.

## 2023-02-12 ENCOUNTER — Telehealth: Payer: Self-pay | Admitting: Internal Medicine

## 2023-02-12 NOTE — Telephone Encounter (Signed)
Prescription Request  02/12/2023  LOV: 12/26/2022  What is the name of the medication or equipment? tramadol  Have you contacted your pharmacy to request a refill? Yes   Which pharmacy would you like this sent to?  [id number] [place], [place] - [address] [place] [address] Phone: [phone] Fax: [phone]    Patient notified that their request is being sent to the clinical staff for review and that they should receive a response within 2 business days.   Please advise at Mobile [phone] (mobile)

## 2023-02-14 MED ORDER — TRAMADOL HCL 50 MG PO TABS: ORAL_TABLET | ORAL | 3 refills | Status: DC

## 2023-02-14 NOTE — Telephone Encounter (Signed)
Done. Thanks.

## 2023-02-22 ENCOUNTER — Encounter (HOSPITAL_COMMUNITY): Payer: Self-pay | Admitting: *Deleted

## 2023-02-22 ENCOUNTER — Emergency Department (HOSPITAL_COMMUNITY): Payer: Medicare Other

## 2023-02-22 ENCOUNTER — Other Ambulatory Visit: Payer: Self-pay

## 2023-02-22 ENCOUNTER — Inpatient Hospital Stay (HOSPITAL_COMMUNITY)
Admission: EM | Admit: 2023-02-22 | Discharge: 2023-02-24 | DRG: 065 | Disposition: A | Discharge status: Home Health | Payer: Medicare Other | Attending: Internal Medicine | Admitting: Internal Medicine

## 2023-02-22 DIAGNOSIS — Z7982 Long term (current) use of aspirin: Secondary | ICD-10-CM

## 2023-02-22 DIAGNOSIS — I610 Nontraumatic intracerebral hemorrhage in hemisphere, subcortical: Secondary | ICD-10-CM | POA: Diagnosis not present

## 2023-02-22 DIAGNOSIS — R9089 Other abnormal findings on diagnostic imaging of central nervous system: Secondary | ICD-10-CM | POA: Diagnosis not present

## 2023-02-22 DIAGNOSIS — E785 Hyperlipidemia, unspecified: Secondary | ICD-10-CM | POA: Diagnosis not present

## 2023-02-22 DIAGNOSIS — N401 Enlarged prostate with lower urinary tract symptoms: Secondary | ICD-10-CM | POA: Diagnosis present

## 2023-02-22 DIAGNOSIS — Z7902 Long term (current) use of antithrombotics/antiplatelets: Secondary | ICD-10-CM

## 2023-02-22 DIAGNOSIS — R9389 Abnormal findings on diagnostic imaging of other specified body structures: Secondary | ICD-10-CM | POA: Diagnosis present

## 2023-02-22 DIAGNOSIS — R29704 NIHSS score 4: Secondary | ICD-10-CM | POA: Diagnosis not present

## 2023-02-22 DIAGNOSIS — R29818 Other symptoms and signs involving the nervous system: Secondary | ICD-10-CM | POA: Diagnosis not present

## 2023-02-22 DIAGNOSIS — I611 Nontraumatic intracerebral hemorrhage in hemisphere, cortical: Secondary | ICD-10-CM | POA: Diagnosis not present

## 2023-02-22 DIAGNOSIS — Z79899 Other long term (current) drug therapy: Secondary | ICD-10-CM

## 2023-02-22 DIAGNOSIS — Q2111 Secundum atrial septal defect: Secondary | ICD-10-CM

## 2023-02-22 DIAGNOSIS — R351 Nocturia: Secondary | ICD-10-CM | POA: Diagnosis present

## 2023-02-22 DIAGNOSIS — I639 Cerebral infarction, unspecified: Secondary | ICD-10-CM

## 2023-02-22 DIAGNOSIS — R413 Other amnesia: Secondary | ICD-10-CM | POA: Diagnosis not present

## 2023-02-22 DIAGNOSIS — R531 Weakness: Secondary | ICD-10-CM | POA: Diagnosis not present

## 2023-02-22 DIAGNOSIS — I6523 Occlusion and stenosis of bilateral carotid arteries: Secondary | ICD-10-CM | POA: Diagnosis not present

## 2023-02-22 DIAGNOSIS — R2689 Other abnormalities of gait and mobility: Secondary | ICD-10-CM | POA: Diagnosis not present

## 2023-02-22 DIAGNOSIS — J449 Chronic obstructive pulmonary disease, unspecified: Secondary | ICD-10-CM | POA: Diagnosis not present

## 2023-02-22 DIAGNOSIS — I629 Nontraumatic intracranial hemorrhage, unspecified: Secondary | ICD-10-CM | POA: Diagnosis present

## 2023-02-22 DIAGNOSIS — I110 Hypertensive heart disease with heart failure: Secondary | ICD-10-CM | POA: Diagnosis present

## 2023-02-22 DIAGNOSIS — I10 Essential (primary) hypertension: Secondary | ICD-10-CM | POA: Diagnosis not present

## 2023-02-22 DIAGNOSIS — Z8249 Family history of ischemic heart disease and other diseases of the circulatory system: Secondary | ICD-10-CM

## 2023-02-22 DIAGNOSIS — I6340 Cerebral infarction due to embolism of unspecified cerebral artery: Principal | ICD-10-CM | POA: Diagnosis present

## 2023-02-22 DIAGNOSIS — Z1152 Encounter for screening for COVID-19: Secondary | ICD-10-CM

## 2023-02-22 DIAGNOSIS — R4781 Slurred speech: Secondary | ICD-10-CM | POA: Diagnosis not present

## 2023-02-22 DIAGNOSIS — R9431 Abnormal electrocardiogram [ECG] [EKG]: Secondary | ICD-10-CM | POA: Diagnosis not present

## 2023-02-22 DIAGNOSIS — Z888 Allergy status to other drugs, medicaments and biological substances status: Secondary | ICD-10-CM

## 2023-02-22 DIAGNOSIS — I6381 Other cerebral infarction due to occlusion or stenosis of small artery: Secondary | ICD-10-CM | POA: Diagnosis not present

## 2023-02-22 DIAGNOSIS — I609 Nontraumatic subarachnoid hemorrhage, unspecified: Secondary | ICD-10-CM | POA: Diagnosis not present

## 2023-02-22 DIAGNOSIS — G9340 Encephalopathy, unspecified: Secondary | ICD-10-CM | POA: Diagnosis not present

## 2023-02-22 DIAGNOSIS — R4182 Altered mental status, unspecified: Principal | ICD-10-CM

## 2023-02-22 DIAGNOSIS — Z87891 Personal history of nicotine dependence: Secondary | ICD-10-CM | POA: Diagnosis not present

## 2023-02-22 DIAGNOSIS — D72829 Elevated white blood cell count, unspecified: Secondary | ICD-10-CM | POA: Diagnosis present

## 2023-02-22 DIAGNOSIS — I6389 Other cerebral infarction: Secondary | ICD-10-CM | POA: Diagnosis not present

## 2023-02-22 DIAGNOSIS — Q2112 Patent foramen ovale: Secondary | ICD-10-CM

## 2023-02-22 DIAGNOSIS — J4489 Other specified chronic obstructive pulmonary disease: Secondary | ICD-10-CM | POA: Diagnosis not present

## 2023-02-22 DIAGNOSIS — I6930 Unspecified sequelae of cerebral infarction: Secondary | ICD-10-CM

## 2023-02-22 DIAGNOSIS — R918 Other nonspecific abnormal finding of lung field: Secondary | ICD-10-CM | POA: Diagnosis not present

## 2023-02-22 DIAGNOSIS — I619 Nontraumatic intracerebral hemorrhage, unspecified: Secondary | ICD-10-CM | POA: Diagnosis present

## 2023-02-22 LAB — CK: Total CK: 78 U/L (ref 49–397)

## 2023-02-22 LAB — COMPREHENSIVE METABOLIC PANEL
ALT: 16 U/L (ref 0–44)
AST: 16 U/L (ref 15–41)
Albumin: 3.6 g/dL (ref 3.5–5.0)
Alkaline Phosphatase: 52 U/L (ref 38–126)
Anion gap: 12 (ref 5–15)
BUN: 7 mg/dL — ABNORMAL LOW (ref 8–23)
CO2: 24 mmol/L (ref 22–32)
Calcium: 9.4 mg/dL (ref 8.9–10.3)
Chloride: 101 mmol/L (ref 98–111)
Creatinine, Ser: 0.93 mg/dL (ref 0.61–1.24)
GFR, Estimated: >60 mL/min (ref >60)
Glucose, Bld: 113 mg/dL — ABNORMAL HIGH (ref 70–99)
Potassium: 4 mmol/L (ref 3.5–5.1)
Sodium: 137 mmol/L (ref 135–145)
Total Bilirubin: 0.5 mg/dL (ref 0.3–1.2)
Total Protein: 7 g/dL (ref 6.5–8.1)

## 2023-02-22 LAB — DIFFERENTIAL
Abs Immature Granulocytes: 0 10*3/uL (ref 0.00–0.07)
Basophils Absolute: 0 10*3/uL (ref 0.0–0.1)
Basophils Relative: 0 %
Eosinophils Absolute: 0 10*3/uL (ref 0.0–0.5)
Eosinophils Relative: 0 %
Lymphocytes Relative: 16 %
Lymphs Abs: 1.6 10*3/uL (ref 0.7–4.0)
Monocytes Absolute: 0.6 10*3/uL (ref 0.1–1.0)
Monocytes Relative: 6 %
Neutro Abs: 7.8 10*3/uL — ABNORMAL HIGH (ref 1.7–7.7)
Neutrophils Relative %: 78 %
nRBC: 0 /100 WBC

## 2023-02-22 LAB — I-STAT CHEM 8, ED
BUN: 7 mg/dL — ABNORMAL LOW (ref 8–23)
Calcium, Ion: 1.17 mmol/L (ref 1.15–1.40)
Chloride: 104 mmol/L (ref 98–111)
Creatinine, Ser: 0.9 mg/dL (ref 0.61–1.24)
Glucose, Bld: 112 mg/dL — ABNORMAL HIGH (ref 70–99)
HCT: 40 % (ref 39.0–52.0)
Hemoglobin: 13.6 g/dL (ref 13.0–17.0)
Potassium: 4.2 mmol/L (ref 3.5–5.1)
Sodium: 138 mmol/L (ref 135–145)
TCO2: 27 mmol/L (ref 22–32)

## 2023-02-22 LAB — APTT: aPTT: 31 seconds (ref 24–36)

## 2023-02-22 LAB — I-STAT VENOUS BLOOD GAS, ED
Acid-Base Excess: 1 mmol/L (ref 0.0–2.0)
Bicarbonate: 25.6 mmol/L (ref 20.0–28.0)
Calcium, Ion: 1.16 mmol/L (ref 1.15–1.40)
HCT: 38 % — ABNORMAL LOW (ref 39.0–52.0)
Hemoglobin: 12.9 g/dL — ABNORMAL LOW (ref 13.0–17.0)
O2 Saturation: 100 %
Potassium: 4.1 mmol/L (ref 3.5–5.1)
Sodium: 137 mmol/L (ref 135–145)
TCO2: 27 mmol/L (ref 22–32)
pCO2, Ven: 39.7 mmHg — ABNORMAL LOW (ref 44–60)
pH, Ven: 7.418 (ref 7.25–7.43)
pO2, Ven: 199 mmHg — ABNORMAL HIGH (ref 32–45)

## 2023-02-22 LAB — CBC
HCT: 37.7 % — ABNORMAL LOW (ref 39.0–52.0)
Hemoglobin: 12 g/dL — ABNORMAL LOW (ref 13.0–17.0)
MCH: 22.9 pg — ABNORMAL LOW (ref 26.0–34.0)
MCHC: 31.8 g/dL (ref 30.0–36.0)
MCV: 71.9 fL — ABNORMAL LOW (ref 80.0–100.0)
Platelets: 404 10*3/uL — ABNORMAL HIGH (ref 150–400)
RBC: 5.24 MIL/uL (ref 4.22–5.81)
RDW: 24.3 % — ABNORMAL HIGH (ref 11.5–15.5)
WBC: 10 10*3/uL (ref 4.0–10.5)
nRBC: 0 % (ref 0.0–0.2)

## 2023-02-22 LAB — MAGNESIUM: Magnesium: 1.8 mg/dL (ref 1.7–2.4)

## 2023-02-22 LAB — PROTIME-INR
INR: 1.1 (ref 0.8–1.2)
Prothrombin Time: 14.6 seconds (ref 11.4–15.2)

## 2023-02-22 LAB — CBG MONITORING, ED: Glucose-Capillary: 114 mg/dL — ABNORMAL HIGH (ref 70–99)

## 2023-02-22 LAB — ETHANOL: Alcohol, Ethyl (B): <10 mg/dL (ref <10)

## 2023-02-22 LAB — TROPONIN I (HIGH SENSITIVITY): Troponin I (High Sensitivity): 11 ng/L (ref <18)

## 2023-02-22 LAB — TSH: TSH: 1.239 u[IU]/mL (ref 0.350–4.500)

## 2023-02-22 LAB — AMMONIA: Ammonia: 11 umol/L (ref 9–35)

## 2023-02-22 LAB — PHOSPHORUS: Phosphorus: 3 mg/dL (ref 2.5–4.6)

## 2023-02-22 MED ORDER — ACETAMINOPHEN 325 MG PO TABS: 650.0000 mg | ORAL_TABLET | ORAL | Status: DC | PRN

## 2023-02-22 MED ORDER — TRAMADOL HCL 50 MG PO TABS
50.0000 mg | ORAL_TABLET | Freq: Four times a day (QID) | ORAL | Status: DC | PRN
  Administered 2023-02-22 – 2023-02-23 (×3): 50 mg via ORAL
  Filled 2023-02-22 (×3): qty 1

## 2023-02-22 MED ORDER — ASPIRIN 300 MG RE SUPP: 300.0000 mg | Freq: Every day | RECTAL | Status: DC

## 2023-02-22 MED ORDER — IOHEXOL 350 MG/ML SOLN
75.0000 mL | Freq: Once | INTRAVENOUS | Status: AC | PRN
  Administered 2023-02-22: 75 mL via INTRAVENOUS

## 2023-02-22 MED ORDER — LABETALOL HCL 5 MG/ML IV SOLN
10.0000 mg | Freq: Once | INTRAVENOUS | Status: AC
  Administered 2023-02-22: 10 mg via INTRAVENOUS
  Filled 2023-02-22: qty 4

## 2023-02-22 MED ORDER — SODIUM CHLORIDE 0.9% FLUSH: 3.0000 mL | Freq: Once | INTRAVENOUS | Status: DC

## 2023-02-22 MED ORDER — ASPIRIN 81 MG PO CHEW
81.0000 mg | CHEWABLE_TABLET | Freq: Every day | ORAL | Status: DC
  Administered 2023-02-22: 81 mg via ORAL
  Filled 2023-02-22: qty 1

## 2023-02-22 MED ORDER — SODIUM CHLORIDE 0.9 % IV SOLN: INTRAVENOUS | Status: DC

## 2023-02-22 MED ORDER — FINASTERIDE 5 MG PO TABS
5.0000 mg | ORAL_TABLET | Freq: Every day | ORAL | Status: DC
  Administered 2023-02-23 – 2023-02-24 (×2): 5 mg via ORAL
  Filled 2023-02-22 (×2): qty 1

## 2023-02-22 MED ORDER — ACETAMINOPHEN 650 MG RE SUPP: 650.0000 mg | RECTAL | Status: DC | PRN

## 2023-02-22 MED ORDER — STROKE: EARLY STAGES OF RECOVERY BOOK
Freq: Once | Status: AC
  Filled 2023-02-22: qty 1

## 2023-02-22 MED ORDER — ACETAMINOPHEN 160 MG/5ML PO SOLN: 650.0000 mg | ORAL | Status: DC | PRN

## 2023-02-22 NOTE — Assessment & Plan Note (Signed)
Chronic stable cont home meds Currently non contributory

## 2023-02-22 NOTE — ED Notes (Signed)
Patient transported to MRI 

## 2023-02-22 NOTE — ED Notes (Signed)
ED TO INPATIENT HANDOFF REPORT  ED Nurse Name and Phone #: Grover Canavan [phone]  S Name/Age/Gender Eric [name] 82 y.o. male Room/Bed: 007C/007C  Code Status   Code Status: Not on file  Home/SNF/Other Home Patient oriented to: self Is this baseline? No      Chief Complaint ICH (intracerebral hemorrhage) (HCC) [I61.9]  Triage Note Daughter states she was called by her brother this am states patient was confused .last known nornal Sunday. . Denies pain. Unable to state where he is or what day it is. Son states he has very limited sight however today was worse. Was doing the opposite what was ask of him. Onset 2 hours ago. Steady gait with help , was afraid he was going to fall.    Allergies Allergies  Allergen Reactions   Hctz [Hydrochlorothiazide]     Peeing too much    Level of Care/Admitting Diagnosis ED Disposition   ED Disposition: Admit Condition: None Comment: Hospital Area: [facility] [[id number]]  Level of Care: Progressive [102]  Admit to Progressive based on following criteria: NEUROLOGICAL AND NEUROSURGICAL complex patients with significant risk of instability, who do not meet ICU criteria, yet require close observation or frequent assessment (< / = every 2 - 4 hours) with medical / nursing intervention.  May admit patient to [facility] or [facility] if equivalent level of care is available:: No  Covid Evaluation: Asymptomatic - no recent exposure (last 10 days) testing not required  Diagnosis: ICH (intracerebral hemorrhage) [facility]) [[id number]]  Admitting Physician: Therisa Doyne [[id number]]  Attending Physician: Therisa Doyne [[id number]]  Certification:: I certify this patient will need inpatient services for at least 2 midnights  Estimated Length of Stay: 2      B Medical/Surgery History Past Medical History:  Diagnosis Date   Benign prostatic hypertrophy    COPD (chronic obstructive pulmonary disease) (HCC)    in past   Difficulty  reading    and writing   ED (erectile dysfunction)    Gynecomastia, 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   Hypertension    Stroke (HCC) 06/2014   Past Surgical History:  Procedure Laterality Date   bilateral breast removal     COLONOSCOPY  04/24/05     A IV Location/Drains/Wounds Patient Lines/Drains/Airways Status     Active Line/Drains/Airways     Name Placement date Placement time Site Days   Peripheral IV 02/22/23 20 G 1" Anterior;Left;Lateral Forearm 02/22/23  1837  Forearm  less than 1            Intake/Output Last 24 hours No intake or output data in the 24 hours ending 02/22/23 2159  Labs/Imaging Results for orders placed or performed during the hospital encounter of 02/22/23 (from the past 48 hour(s))  CBG monitoring, ED     Status: Abnormal   Collection Time: 02/22/23 12:34 PM  Result Value Ref Range   Glucose-Capillary 114 (H) 70 - 99 mg/dL    Comment: Glucose reference range applies only to samples taken after fasting for at least 8 hours.   Comment 1 Notify RN    Comment 2 Document in Chart   Protime-INR     Status: None   Collection Time: 02/22/23 12:36 PM  Result Value Ref Range   Prothrombin Time 14.6 11.4 - 15.2 seconds   INR 1.1 0.8 - 1.2    Comment: (NOTE) INR goal varies based on device and disease states. Performed at [facility], [address]  [place], [place] [address]   APTT     Status: None   Collection Time: 02/22/23 12:36 PM  Result Value Ref Range   aPTT 31 24 - 36 seconds    Comment: Performed at [facility], [address], [place], [place] [address]  CBC     Status: Abnormal   Collection Time: 02/22/23 12:36 PM  Result Value Ref Range   WBC 10.0 4.0 - 10.5 K/uL   RBC 5.24 4.22 - 5.81 MIL/uL   Hemoglobin 12.0 (L) 13.0 - 17.0 g/dL   HCT 29.5 (L) 62.1 - 30.8 %   MCV 71.9 (L) 80.0 - 100.0 fL   MCH 22.9 (L) 26.0 - 34.0 pg   MCHC 31.8 30.0 - 36.0 g/dL   RDW 65.7 (H) 84.6 - 96.2 %   Platelets 404 (H) 150 - 400 K/uL    nRBC 0.0 0.0 - 0.2 %    Comment: Performed at [facility], [address] [place], [place] [address]  Differential     Status: Abnormal   Collection Time: 02/22/23 12:36 PM  Result Value Ref Range   Neutrophils Relative % 78 %   Neutro Abs 7.8 (H) 1.7 - 7.7 K/uL   Lymphocytes Relative 16 %   Lymphs Abs 1.6 0.7 - 4.0 K/uL   Monocytes Relative 6 %   Monocytes Absolute 0.6 0.1 - 1.0 K/uL   Eosinophils Relative 0 %   Eosinophils Absolute 0.0 0.0 - 0.5 K/uL   Basophils Relative 0 %   Basophils Absolute 0.0 0.0 - 0.1 K/uL   nRBC 0 0 /100 WBC   Abs Immature Granulocytes 0.00 0.00 - 0.07 K/uL   Polychromasia PRESENT    Target Cells PRESENT     Comment: Performed at [facility], [address], [place], [place] [address]  Comprehensive metabolic panel     Status: Abnormal   Collection Time: 02/22/23 12:36 PM  Result Value Ref Range   Sodium 137 135 - 145 mmol/L   Potassium 4.0 3.5 - 5.1 mmol/L   Chloride 101 98 - 111 mmol/L   CO2 24 22 - 32 mmol/L   Glucose, Bld 113 (H) 70 - 99 mg/dL    Comment: Glucose reference range applies only to samples taken after fasting for at least 8 hours.   BUN 7 (L) 8 - 23 mg/dL   Creatinine, Ser 0.10 0.61 - 1.24 mg/dL   Calcium 9.4 8.9 - 27.2 mg/dL   Total Protein 7.0 6.5 - 8.1 g/dL   Albumin 3.6 3.5 - 5.0 g/dL   AST 16 15 - 41 U/L   ALT 16 0 - 44 U/L   Alkaline Phosphatase 52 38 - 126 U/L   Total Bilirubin 0.5 0.3 - 1.2 mg/dL   GFR, Estimated >53 >66 mL/min    Comment: (NOTE) Calculated using the CKD-EPI Creatinine Equation (2021)    Anion gap 12 5 - 15    Comment: Performed at [facility], [address], [place], [place] [address]  Ethanol     Status: None   Collection Time: 02/22/23 12:36 PM  Result Value Ref Range   Alcohol, Ethyl (B) <10 <10 mg/dL    Comment: (NOTE) Lowest detectable limit for serum alcohol is 10 mg/dL.  For medical purposes only. Performed at [facility], [address], [place],  [place] [address]   I-stat chem 8, ED     Status: Abnormal   Collection Time: 02/22/23 12:48 PM  Result Value Ref Range   Sodium 138 135 - 145 mmol/L   Potassium 4.2 3.5 - 5.1 mmol/L   Chloride 104 98 - 111 mmol/L   BUN 7 (L) 8 - 23 mg/dL   Creatinine, Ser 8.75 0.61 - 1.24 mg/dL   Glucose, Bld 643 (H) 70 - 99 mg/dL    Comment: Glucose reference range applies only to samples taken after fasting for at least 8 hours.   Calcium, Ion 1.17 1.15 - 1.40 mmol/L   TCO2 27 22 - 32 mmol/L   Hemoglobin 13.6 13.0 - 17.0 g/dL   HCT 32.9 51.8 - 84.1 %  Troponin I (High Sensitivity)     Status: None   Collection Time: 02/22/23  4:15 PM  Result Value Ref Range   Troponin I (High Sensitivity) 11 <18 ng/L    Comment: (NOTE) Elevated high sensitivity troponin I (hsTnI) values and significant  changes across serial measurements may suggest ACS but many other  chronic and acute conditions are known to elevate hsTnI results.  Refer to the "Links" section for chest pain algorithms and additional  guidance. Performed at [facility], [address] [place], [place] [address]    CT ANGIO HEAD NECK W WO CM  Result Date: 02/22/2023 CLINICAL DATA:  Stroke/TIA EXAM: CT ANGIOGRAPHY HEAD AND NECK WITH AND WITHOUT CONTRAST TECHNIQUE: Multidetector CT imaging of the head and neck was performed using the standard protocol during bolus administration of intravenous contrast. Multiplanar CT image reconstructions and MIPs were obtained to evaluate the vascular anatomy. Carotid stenosis measurements (when applicable) are obtained utilizing NASCET criteria, using the distal internal carotid diameter as the denominator. RADIATION DOSE REDUCTION: This exam was performed according to the departmental dose-optimization program which includes automated exposure control, adjustment of the mA and/or kV according to patient size and/or use of iterative reconstruction technique. CONTRAST:  75mL OMNIPAQUE IOHEXOL 350 MG/ML SOLN  COMPARISON:  None Available. FINDINGS: CTA NECK FINDINGS SKELETON: There is no bony spinal canal stenosis. No lytic or blastic lesion. OTHER NECK: Normal pharynx, larynx and major salivary glands. No cervical lymphadenopathy. Unremarkable thyroid gland. UPPER CHEST: No pneumothorax or pleural effusion. No nodules or masses. AORTIC ARCH: There is no calcific atherosclerosis of the aortic arch. Conventional 3 vessel aortic branching pattern. RIGHT CAROTID SYSTEM: No dissection, occlusion or aneurysm. Mild atherosclerotic calcification at the carotid bifurcation without hemodynamically significant stenosis. The distal right ICA is diffusely mildly narrowed. LEFT CAROTID SYSTEM: No dissection, occlusion or aneurysm. There is mixed density atherosclerosis extending into the proximal ICA, resulting in less than 50% stenosis. VERTEBRAL ARTERIES: Left dominant configuration.There is no dissection, occlusion or flow-limiting stenosis to the skull base (V1-V3 segments). CTA HEAD FINDINGS POSTERIOR CIRCULATION: --Vertebral arteries: Normal V4 segments. --Inferior cerebellar arteries: Normal. --Basilar artery: Normal. --Superior cerebellar arteries: Normal. --Posterior cerebral arteries (PCA): Normal. ANTERIOR CIRCULATION: --Intracranial internal carotid arteries: Bilateral atherosclerotic calcification with moderate stenosis of the cavernous segments. --Anterior cerebral arteries (ACA): Normal. Both A1 segments are present. Patent anterior communicating artery (a-comm). --Middle cerebral arteries (MCA): Normal. VENOUS SINUSES: As permitted by contrast timing, patent. ANATOMIC VARIANTS: None Review of the MIP images confirms the above findings. IMPRESSION: 1. No emergent large vessel occlusion. 2. 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 3. Bilateral carotid bifurcation atherosclerosis without hemodynamically significant stenosis. Electronically Signed   By: Deatra Robinson M.D.   On:  02/22/2023 20:50   MR BRAIN WO CONTRAST  Result Date: 02/22/2023 CLINICAL DATA:  Acute  neurologic deficit EXAM: MRI HEAD WITHOUT CONTRAST TECHNIQUE: Multiplanar, multiecho pulse sequences of the brain and surrounding structures were obtained without intravenous contrast. COMPARISON:  11/09/2015 FINDINGS: Truncated examination due to patient altered mental status. Only axial and coronal diffusion-weighted imaging was obtained. There is a small area of acute ischemia within the posterior left parietal lobe. There is an old right temporal infarct. Mild generalized volume loss. IMPRESSION: Small area of acute ischemia within the posterior left parietal lobe. Electronically Signed   By: Deatra Robinson M.D.   On: 02/22/2023 19:01   DG Chest Port 1 View  Result Date: 02/22/2023 CLINICAL DATA:  Altered mental status EXAM: PORTABLE CHEST 1 VIEW COMPARISON:  Chest radiograph dated 11/08/2015 FINDINGS: Normal lung volumes. Patchy left retrocardiac opacity. No pleural effusion or pneumothorax. The heart size and mediastinal contours are within normal limits. No acute osseous abnormality. Unchanged punctate radiodensity projects over the right neck. IMPRESSION: Patchy left retrocardiac opacity, which may represent atelectasis, aspiration, or pneumonia. Electronically Signed   By: Agustin Cree M.D.   On: 02/22/2023 15:28   CT HEAD WO CONTRAST  Addendum Date: 02/22/2023   ADDENDUM REPORT: 02/22/2023 13:28 ADDENDUM: Critical Value/emergent results were called by telephone at the time of interpretation on 02/22/2023 at 1:27 pm to provider Dr. Durwin Nora, who verbally acknowledged these results. Electronically Signed   By: Kennith Center M.D.   On: 02/22/2023 13:28   Result Date: 02/22/2023 CLINICAL DATA:  Memory loss EXAM: CT HEAD WITHOUT CONTRAST TECHNIQUE: Contiguous axial images were obtained from the base of the skull through the vertex without intravenous contrast. RADIATION DOSE REDUCTION: This exam was performed according  to the departmental dose-optimization program which includes automated exposure control, adjustment of the mA and/or kV according to patient size and/or use of iterative reconstruction technique. COMPARISON:  07/23/2013.  Brain MRI 11/09/2015 FINDINGS: Brain: There is a small focus of subarachnoid hemorrhage in the anterior right frontal lobe (axial 13/2 and well seen coronal 14/4. Right temporoparietal encephalomalacia corresponding to the area of hemorrhage seen on remote CT from 2014. High right frontal encephalomalacia appears progressive since 2014. High left parietal nonacute infarct/encephalomalacia. Encephalomalacia in the left occipital lobe is compatible with remote infarct. Lacunar infarcts are noted in the left cerebellum. Lacunar infarcts are seen in the basal ganglia bilaterally diffuse loss of parenchymal volume is consistent with atrophy. Patchy low attenuation in the deep hemispheric and periventricular white matter is nonspecific, but likely reflects chronic microvascular ischemic demyelination. Vascular: No hyperdense vessel or unexpected calcification. Skull: No evidence for fracture. No worrisome lytic or sclerotic lesion. Sinuses/Orbits: The visualized paranasal sinuses and mastoid air cells are clear. Visualized portions of the globes and intraorbital fat are unremarkable. Other: None. IMPRESSION: 1. Small focus of subarachnoid hemorrhage in the anterior right frontal lobe. 2. Multifocal areas of encephalomalacia/chronic infarct involving both cerebral hemispheres and the left cerebellum. 3. Atrophy with chronic small vessel ischemic disease. Electronically Signed: By: Kennith Center M.D. On: 02/22/2023 13:19    Pending Labs Unresulted Labs (From admission, onward)     Start     Ordered   02/23/23 0500  Lipid panel  Tomorrow morning,   R        02/22/23 1941   02/23/23 0500  Hemoglobin A1c  Tomorrow morning,   R        02/22/23 1941   02/23/23 0500  Prealbumin  Tomorrow morning,   R         02/22/23 2115   02/22/23 2134  CK  Add-on,   AD        02/22/23 2133   02/22/23 2134  Blood gas, venous  ONCE - STAT,   STAT       Question:  Release to patient  Answer:  Immediate   02/22/23 2133   02/22/23 2134  Ammonia  Once,   R       Question:  Release to patient  Answer:  Immediate   02/22/23 2133   02/22/23 2134  Magnesium  Add-on,   AD        02/22/23 2133   02/22/23 2134  Phosphorus  Add-on,   AD        02/22/23 2133   02/22/23 2134  Urinalysis, Complete w Microscopic -Urine, Clean Catch  Once,   R       Question Answer Comment  Release to patient Immediate   Specimen Source Urine, Clean Catch      02/22/23 2133   02/22/23 2134  TSH  Add-on,   AD        02/22/23 2133   02/22/23 2116  Procalcitonin  Add-on,   AD       References:    Procalcitonin Lower Respiratory Tract Infection AND Sepsis Procalcitonin Algorithm   02/22/23 2115   02/22/23 1500  Urinalysis, Routine w reflex microscopic -Urine, Clean Catch  Once,   URGENT       Question:  Specimen Source  Answer:  Urine, Clean Catch   02/22/23 1459            Vitals/Pain Today's Vitals   02/22/23 1229 02/22/23 1652 02/22/23 1700 02/22/23 2154  BP:  (Abnormal) 162/73 (Abnormal) 158/97   Pulse:  66 72   Resp:  16 14   Temp:  98 F (36.7 C)  97.9 F (36.6 C)  TempSrc:  Oral  Oral  SpO2:  100% 100%   Weight: 66.7 kg     Height: 5\' 8"  (1.727 m)     PainSc: 0-No pain       Isolation Precautions No active isolations  Medications Medications  sodium chloride flush (NS) 0.9 % injection 3 mL (3 mLs Intravenous Not Given 02/22/23 1325)  0.9 %  sodium chloride infusion ( Intravenous New Bag/Given 02/22/23 1958)  iohexol (OMNIPAQUE) 350 MG/ML injection 75 mL (75 mLs Intravenous Contrast Given 02/22/23 2026)    Mobility walks with person assist     Focused Assessments Neuro Assessment Handoff:  Swallow screen pass? Yes    NIH Stroke Scale  Dizziness Present: No Headache Present: No Level of  Consciousness (1a.)   : Alert, keenly responsive LOC Questions (1b. )   : Answers one question correctly LOC Commands (1c. )   : Performs both tasks correctly Best Gaze (2. )  : Normal Visual (3. )  : No visual loss Facial Palsy (4. )    : Normal symmetrical movements Motor Arm, Left (5a. )   : No drift Motor Arm, Right (5b. ) : No drift Motor Leg, Left (6a. )  : No drift Motor Leg, Right (6b. ) : No drift Limb Ataxia (7. ): Absent Sensory (8. )  : Normal, no sensory loss Best Language (9. )  : No aphasia Dysarthria (10. ): Normal Extinction/Inattention (11.)   : No Abnormality Complete NIHSS TOTAL: 1     Neuro Assessment: Exceptions to WDL Neuro Checks:      Has TPA been given? No If patient is a Neuro Trauma and patient is going  to OR before floor call report to 4N Charge nurse: [phone] or [phone]   R Recommendations: See Admitting Provider Note  Report given to:   Additional Notes:

## 2023-02-22 NOTE — Assessment & Plan Note (Addendum)
Possible small finding on CT neurology and NS is awre Hold off on aspirin or anticoagulaiton   Frequent neurochecks SBP <160

## 2023-02-22 NOTE — H&P (Addendum)
Eric [name] ZOX:[id number] DOB: March 22, 1941 DOA: 02/22/2023     PCP: Tresa Garter, MD   Outpatient Specialists:  NONE    Patient arrived to ER on 02/22/23 at 1208 Referred by Attending Vanetta Mulders, MD   Patient coming from:    home Lives  With family    Chief Complaint:   Chief Complaint  Patient presents with   Altered Mental Status    HPI: Eric [name] is a 82 y.o. male with medical history significant of COPD, HTN, hx of CVA 2015, BPH    Presented with  confusion Pt presets with confusion LKW a week ago Sunday Pt not oriented to place, has been more unsteady limited eye sight  At baseline has trouble walking  Hx of CVA in the past  Reports ataxia    Family states has been gradually doing worse, this AM when woke up could not figure out how to urinate at first family thought that he could not see but then it turned out to be hit that he was just confused, and more unsteady Has had some trouble walking for the past few weeks but have deteriorated since this morning    No fever no chills no cough  Denies significant ETOH intake   Does not smoke   Lab Results  Component Value Date   SARSCOV2NAA Not Detected 09/08/2019        Regarding pertinent Chronic problems:      HTN on amLODipine-benazepril      Hx of CVA -  with/out residual deficits not  on Aspirin 81 mg,       BPH - on Proscar, flamax      While in ER:  CT showed small R frontal SAH   limited MRI brain as he was unable to stay still for the MRI which demonstrated a small left posterior left parietal lobe stroke     Lab Orders         Protime-INR         APTT         CBC         Differential         Comprehensive metabolic panel         Ethanol         Urinalysis, Routine w reflex microscopic -Urine, Clean Catch         Lipid panel         Hemoglobin A1c         I-stat chem 8, ED         CBG monitoring, ED         CBG monitoring, ED      CTA HEAD   NON acute  1.  No emergent large vessel occlusion. 2. 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 3. Bilateral carotid bifurcation atherosclerosis without hemodynamically significant stenosis.  CXR - Patchy left retrocardiac opacity, which may represent atelectasis, aspiration, or pneumonia  MRi brain Small area of acute ischemia within the posterior left parietal lobe.  Following Medications were ordered in ER: Medications  sodium chloride flush (NS) 0.9 % injection 3 mL (3 mLs Intravenous Not Given 02/22/23 1325)  0.9 %  sodium chloride infusion ( Intravenous New Bag/Given 02/22/23 1958)  iohexol (OMNIPAQUE) 350 MG/ML injection 75 mL (75 mLs Intravenous Contrast Given 02/22/23 2026)    _______________________________________________________ ER Provider Called:  Neurology   Dr. Theodora Blow They Recommend admit to medicine  SEEN in ER   ED Triage Vitals  Enc Vitals Group     BP 02/22/23 1227 (!) 150/82     Pulse Rate 02/22/23 1227 74     Resp 02/22/23 1227 16     Temp 02/22/23 1227 98.5 F (36.9 C)     Temp Source 02/22/23 1652 Oral     SpO2 02/22/23 1227 97 %     Weight 02/22/23 1229 147 lb 0.8 oz (66.7 kg)     Height 02/22/23 1229 5\' 8"  (1.727 m)     Head Circumference --      Peak Flow --      Pain Score 02/22/23 1229 0     Pain Loc --      Pain Edu? --      Excl. in GC? --   TMAX(24)@     _________________________________________ Significant initial  Findings: Abnormal Labs Reviewed  CBC - Abnormal; Notable for the following components:      Result Value   Hemoglobin 12.0 (*)    HCT 37.7 (*)    MCV 71.9 (*)    MCH 22.9 (*)    RDW 24.3 (*)    Platelets 404 (*)    All other components within normal limits  DIFFERENTIAL - Abnormal; Notable for the following components:   Neutro Abs 7.8 (*)    All other components within normal limits  COMPREHENSIVE METABOLIC PANEL - Abnormal; Notable for the following components:   Glucose, Bld 113 (*)     BUN 7 (*)    All other components within normal limits  CBG MONITORING, ED - Abnormal; Notable for the following components:   Glucose-Capillary 114 (*)    All other components within normal limits  I-STAT CHEM 8, ED - Abnormal; Notable for the following components:   BUN 7 (*)    Glucose, Bld 112 (*)    All other components within normal limits    _________________________ Troponin  ordered Cardiac Panel (last 3 results) Recent Labs    02/22/23 1615  TROPONINIHS 11     ECG: Ordered Personally reviewed and interpreted by me showing: HR : 77 Rhythm:Normal sinus rhythm Normal ECG QTC 383    The recent clinical data is shown below. Vitals:   02/22/23 2154 02/22/23 2155 02/22/23 2245 02/22/23 2341  BP:  (!) 191/99 (!) 140/72 (!) 153/93  Pulse:  95  79  Resp:  (!) 23  16  Temp: 97.9 F (36.6 C)     TempSrc: Oral     SpO2:  99%  98%  Weight:      Height:        WBC     Component Value Date/Time   WBC 10.0 02/22/2023 1236   LYMPHSABS 1.6 02/22/2023 1236   MONOABS 0.6 02/22/2023 1236   EOSABS 0.0 02/22/2023 1236   BASOSABS 0.0 02/22/2023 1236       UA   ordered     Results for orders placed or performed in visit on 09/08/19  Novel Coronavirus, NAA ([facility])     Status: None   Collection Time: 09/08/19  3:07 PM   Specimen: Nasopharyngeal(NP) swabs in vial transport medium   NASOPHARYNGE  TESTING  Result Value Ref Range Status   SARS-CoV-2, NAA Not Detected Not Detected Final    Comment: This nucleic acid amplification test was developed and its performance characteristics determined by [employer]. Nucleic acid amplification tests include PCR and TMA. This test has not been FDA cleared or approved.  This test has been authorized by FDA under an Emergency Use Authorization (EUA). This test is only authorized for the duration of time the declaration that circumstances exist justifying the authorization of the emergency use of in vitro diagnostic tests  for detection of SARS-CoV-2 virus and/or diagnosis of COVID-19 infection under section 564(b)(1) of the Act, 21 U.S.C. 604VWU-9(W) (1), unless the authorization is terminated or revoked sooner. When diagnostic testing is negative, the possibility of a false negative result should be considered in the context of a patient's recent exposures and the presence of clinical signs and symptoms consistent with COVID-19. An individual without symptoms of COVID-19 and who is not shedding SARS-CoV-2 virus would  expect to have a negative (not detected) result in this assay.      ___ Venous  Blood Gas result:  pH   7.418 Sodium 137 mmol/L   pCO2, Ven 39.7 Low  mmHg Potassium 4.1 mmol/L  pO2, Ven 199 High  mmHg     __________________________________________________________ Recent Labs  Lab 02/22/23 1236 02/22/23 1248 02/22/23 1615 02/22/23 2211  NA 137 138  --  137  K 4.0 4.2  --  4.1  CO2 24  --   --   --   GLUCOSE 113* 112*  --   --   BUN 7* 7*  --   --   CREATININE 0.93 0.90  --   --   CALCIUM 9.4  --   --   --   MG  --   --  1.8  --   PHOS  --   --  3.0  --     Cr   stable,    Lab Results  Component Value Date   CREATININE 0.90 02/22/2023   CREATININE 0.93 02/22/2023   CREATININE 0.97 12/26/2022    Recent Labs  Lab 02/22/23 1236  AST 16  ALT 16  ALKPHOS 52  BILITOT 0.5  PROT 7.0  ALBUMIN 3.6   Lab Results  Component Value Date   CALCIUM 9.4 02/22/2023       Plt: Lab Results  Component Value Date   PLT 404 (H) 02/22/2023       Recent Labs  Lab 02/22/23 1236 02/22/23 1248 02/22/23 2211  WBC 10.0  --   --   NEUTROABS 7.8*  --   --   HGB 12.0* 13.6 12.9*  HCT 37.7* 40.0 38.0*  MCV 71.9*  --   --   PLT 404*  --   --     HG/HCT stable,      Component Value Date/Time   HGB 12.9 (L) 02/22/2023 2211   HCT 38.0 (L) 02/22/2023 2211   MCV 71.9 (L) 02/22/2023 1236      _______________________________________________ Hospitalist was called for  admission for CVA, subarachnoid bleed   The following Work up has been ordered so far:  Orders Placed This Encounter  Procedures   CT HEAD WO CONTRAST   DG Chest Port 1 View   MR BRAIN WO CONTRAST   CT ANGIO HEAD NECK W WO CM   Protime-INR   APTT   CBC   Differential   Comprehensive metabolic panel   Ethanol   Urinalysis, Routine w reflex microscopic -Urine, Clean Catch   Lipid panel   Hemoglobin A1c   Diet NPO time specified   ED Cardiac monitoring   Swallow screen (NPO until completed)   NIH Stroke Scale   Saline Lock IV, Maintain IV access   If O2 sat  ED Cardiac monitoring   Consult to neurosurgery   Consult to Neuro Hospitalist   Consult to Neuro Hospitalist   Consult to hospitalist   Consult to hospitalist   I-stat chem 8, ED   CBG monitoring, ED   CBG monitoring, ED   ED EKG   EKG 12-Lead   ECHOCARDIOGRAM COMPLETE     OTHER Significant initial  Findings:  labs showing:     DM  labs:  HbA1C: No results for input(s): "HGBA1C" in the last 8760 hours.     CBG (last 3)  Recent Labs    02/22/23 1234  GLUCAP 114*          Cultures: No results found for: "SDES", "SPECREQUEST", "CULT", "REPTSTATUS"   Radiological Exams on Admission: CT ANGIO HEAD NECK W WO CM  Result Date: 02/22/2023 CLINICAL DATA:  Stroke/TIA EXAM: CT ANGIOGRAPHY HEAD AND NECK WITH AND WITHOUT CONTRAST TECHNIQUE: Multidetector CT imaging of the head and neck was performed using the standard protocol during bolus administration of intravenous contrast. Multiplanar CT image reconstructions and MIPs were obtained to evaluate the vascular anatomy. Carotid stenosis measurements (when applicable) are obtained utilizing NASCET criteria, using the distal internal carotid diameter as the denominator. RADIATION DOSE REDUCTION: This exam was performed according to the departmental dose-optimization program which includes automated exposure control, adjustment of the mA and/or kV according to  patient size and/or use of iterative reconstruction technique. CONTRAST:  75mL OMNIPAQUE IOHEXOL 350 MG/ML SOLN COMPARISON:  None Available. FINDINGS: CTA NECK FINDINGS SKELETON: There is no bony spinal canal stenosis. No lytic or blastic lesion. OTHER NECK: Normal pharynx, larynx and major salivary glands. No cervical lymphadenopathy. Unremarkable thyroid gland. UPPER CHEST: No pneumothorax or pleural effusion. No nodules or masses. AORTIC ARCH: There is no calcific atherosclerosis of the aortic arch. Conventional 3 vessel aortic branching pattern. RIGHT CAROTID SYSTEM: No dissection, occlusion or aneurysm. Mild atherosclerotic calcification at the carotid bifurcation without hemodynamically significant stenosis. The distal right ICA is diffusely mildly narrowed. LEFT CAROTID SYSTEM: No dissection, occlusion or aneurysm. There is mixed density atherosclerosis extending into the proximal ICA, resulting in less than 50% stenosis. VERTEBRAL ARTERIES: Left dominant configuration.There is no dissection, occlusion or flow-limiting stenosis to the skull base (V1-V3 segments). CTA HEAD FINDINGS POSTERIOR CIRCULATION: --Vertebral arteries: Normal V4 segments. --Inferior cerebellar arteries: Normal. --Basilar artery: Normal. --Superior cerebellar arteries: Normal. --Posterior cerebral arteries (PCA): Normal. ANTERIOR CIRCULATION: --Intracranial internal carotid arteries: Bilateral atherosclerotic calcification with moderate stenosis of the cavernous segments. --Anterior cerebral arteries (ACA): Normal. Both A1 segments are present. Patent anterior communicating artery (a-comm). --Middle cerebral arteries (MCA): Normal. VENOUS SINUSES: As permitted by contrast timing, patent. ANATOMIC VARIANTS: None Review of the MIP images confirms the above findings. IMPRESSION: 1. No emergent large vessel occlusion. 2. 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 3. Bilateral carotid bifurcation  atherosclerosis without hemodynamically significant stenosis. Electronically Signed   By: Deatra Robinson M.D.   On: 02/22/2023 20:50   MR BRAIN WO CONTRAST  Result Date: 02/22/2023 CLINICAL DATA:  Acute neurologic deficit EXAM: MRI HEAD WITHOUT CONTRAST TECHNIQUE: Multiplanar, multiecho pulse sequences of the brain and surrounding structures were obtained without intravenous contrast. COMPARISON:  11/09/2015 FINDINGS: Truncated examination due to patient altered mental status. Only axial and coronal diffusion-weighted imaging was obtained. There is a small area of acute ischemia within the posterior left parietal lobe. There is an old right temporal infarct. Mild generalized volume loss. IMPRESSION: Small area of acute ischemia within the  posterior left parietal lobe. Electronically Signed   By: Deatra Robinson M.D.   On: 02/22/2023 19:01   DG Chest Port 1 View  Result Date: 02/22/2023 CLINICAL DATA:  Altered mental status EXAM: PORTABLE CHEST 1 VIEW COMPARISON:  Chest radiograph dated 11/08/2015 FINDINGS: Normal lung volumes. Patchy left retrocardiac opacity. No pleural effusion or pneumothorax. The heart size and mediastinal contours are within normal limits. No acute osseous abnormality. Unchanged punctate radiodensity projects over the right neck. IMPRESSION: Patchy left retrocardiac opacity, which may represent atelectasis, aspiration, or pneumonia. Electronically Signed   By: Agustin Cree M.D.   On: 02/22/2023 15:28   CT HEAD WO CONTRAST  Addendum Date: 02/22/2023   ADDENDUM REPORT: 02/22/2023 13:28 ADDENDUM: Critical Value/emergent results were called by telephone at the time of interpretation on 02/22/2023 at 1:27 pm to provider Dr. Durwin Nora, who verbally acknowledged these results. Electronically Signed   By: Kennith Center M.D.   On: 02/22/2023 13:28   Result Date: 02/22/2023 CLINICAL DATA:  Memory loss EXAM: CT HEAD WITHOUT CONTRAST TECHNIQUE: Contiguous axial images were obtained from the base of the  skull through the vertex without intravenous contrast. RADIATION DOSE REDUCTION: This exam was performed according to the departmental dose-optimization program which includes automated exposure control, adjustment of the mA and/or kV according to patient size and/or use of iterative reconstruction technique. COMPARISON:  07/23/2013.  Brain MRI 11/09/2015 FINDINGS: Brain: There is a small focus of subarachnoid hemorrhage in the anterior right frontal lobe (axial 13/2 and well seen coronal 14/4. Right temporoparietal encephalomalacia corresponding to the area of hemorrhage seen on remote CT from 2014. High right frontal encephalomalacia appears progressive since 2014. High left parietal nonacute infarct/encephalomalacia. Encephalomalacia in the left occipital lobe is compatible with remote infarct. Lacunar infarcts are noted in the left cerebellum. Lacunar infarcts are seen in the basal ganglia bilaterally diffuse loss of parenchymal volume is consistent with atrophy. Patchy low attenuation in the deep hemispheric and periventricular white matter is nonspecific, but likely reflects chronic microvascular ischemic demyelination. Vascular: No hyperdense vessel or unexpected calcification. Skull: No evidence for fracture. No worrisome lytic or sclerotic lesion. Sinuses/Orbits: The visualized paranasal sinuses and mastoid air cells are clear. Visualized portions of the globes and intraorbital fat are unremarkable. Other: None. IMPRESSION: 1. Small focus of subarachnoid hemorrhage in the anterior right frontal lobe. 2. Multifocal areas of encephalomalacia/chronic infarct involving both cerebral hemispheres and the left cerebellum. 3. Atrophy with chronic small vessel ischemic disease. Electronically Signed: By: Kennith Center M.D. On: 02/22/2023 13:19   _______________________________________________________________________________________________________ Latest  Blood pressure (!) 162/73, pulse 66, temperature 98 F  (36.7 C), temperature source Oral, resp. rate 16, height 5\' 8"  (1.727 m), weight 66.7 kg, SpO2 100 %.   Vitals  labs and radiology finding personally reviewed  Review of Systems:    Pertinent positives include:  fatigue,  confusion  gait abnormality Constitutional:  No weight loss, night sweats, Fevers, chills,  weight loss  HEENT:  No headaches, Difficulty swallowing,Tooth/dental problems,Sore throat,  No sneezing, itching, ear ache, nasal congestion, post nasal drip,  Cardio-vascular:  No chest pain, Orthopnea, PND, anasarca, dizziness, palpitations.no Bilateral lower extremity swelling  GI:  No heartburn, indigestion, abdominal pain, nausea, vomiting, diarrhea, change in bowel habits, loss of appetite, melena, blood in stool, hematemesis Resp:  no shortness of breath at rest. No dyspnea on exertion, No excess mucus, no productive cough, No non-productive cough, No coughing up of blood.No change in color of mucus.No wheezing. Skin:  no rash  or lesions. No jaundice GU:  no dysuria, change in color of urine, no urgency or frequency. No straining to urinate.  No flank pain.  Musculoskeletal:  No joint pain or no joint swelling. No decreased range of motion. No back pain.  Psych:  No change in mood or affect. No depression or anxiety. No memory loss.  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 His smoking use included cigarettes. He has never used smokeless tobacco. He reports that he does not drink alcohol and does not use drugs.      Family History:   Family History  Problem Relation Age of Onset   Arthritis Mother    Diabetes Father    Diabetes Other    Hypertension Other    ______________________________________________________________________________________________ Allergies: Allergies  Allergen Reactions   Hctz [Hydrochlorothiazide]     Peeing too much     Prior to Admission medications   Medication Sig Start Date End Date Taking? Authorizing Provider  amLODipine-benazepril (LOTREL) 5-20 MG capsule TAKE 1 CAPSULE BY MOUTH EVERY DAY 10/28/22   Plotnikov, Georgina Quint, MD  aspirin 81 MG EC tablet TAKE 1 TABLET BY MOUTH EVERY DAY 11/27/16   Micki Riley, MD  Cholecalciferol ([employer] D3) 50 MCG (2000 UT) CAPS Take 1 capsule (2,000 Units total) by mouth daily. 04/24/21   Plotnikov, Georgina Quint, MD  Cyanocobalamin (VITAMIN B-12) 1000 MCG SUBL Place 1 tablet (1,000 mcg total) under the tongue daily. 04/24/21   Plotnikov, Georgina Quint, MD  ferrous sulfate 325 (65 FE) MG tablet Take 1 tablet (325 mg total) by mouth 2 (two) times daily with a meal. 12/29/22 12/29/23  Plotnikov, Georgina Quint, MD  finasteride (PROSCAR) 5 MG tablet Take 1 tablet (5 mg total) by mouth daily. 06/26/22   Plotnikov, Georgina Quint, MD  oxybutynin (DITROPAN-XL) 5 MG 24 hr tablet TAKE 1 TABLET (5 MG TOTAL) BY MOUTH AT BEDTIME. FOR FREQUENT URINATION 10/29/22   Plotnikov, Georgina Quint, MD  pantoprazole (PROTONIX) 40 MG tablet Take 1 tablet (40 mg total) by mouth daily. 01/02/23   Plotnikov, Georgina Quint, MD  tamsulosin (FLOMAX) 0.4 MG CAPS capsule Take 1 capsule (0.4 mg total) by mouth daily. Patient not taking: Reported on 12/26/2022 06/26/22    Plotnikov, Georgina Quint, MD  traMADol (ULTRAM) 50 MG tablet TAKE 1 TABLET 3 TIMES A DAY AS NEEDED SEVERE PAIN 02/14/23   Plotnikov, Georgina Quint, MD    ___________________________________________________________________________________________________ Physical Exam:    02/22/2023    4:52 PM 02/22/2023   12:29 PM 02/22/2023   12:27 PM  Vitals with BMI  Height  5\' 8"    Weight  147 lbs 1 oz   BMI  22.36   Systolic 162  150  Diastolic 73  82  Pulse 66  74     1. General:  in mild acute distress somewhat agitated  Chronically ill   -appearing 2. Psychological: Alert and   Oriented 3. Head/ENT:  Dry Mucous Membranes                          Head Non traumatic, neck supple                          Poor Dentition 4. SKIN:  decreased Skin turgor,  Skin clean Dry and intact no rash    5. Heart: Regular rate and rhythm no  Murmur, no Rub or gallop 6. Lungs: , no wheezes or crackles   7. Abdomen: Soft,  non-tender, Non distended bowel sounds present 8. Lower extremities: no clubbing, cyanosis, no  edema 9. Neurologically Grossly intact, moving all 4 extremities equally not cooperative with exam Able to stand up with assistance patient is somewhat agitated 10. MSK: Normal range of motion    Chart has been reviewed  ______________________________________________________________________________________________  Assessment/Plan 82 y.o. male with medical history significant of COPD, HTN, hx of CVA 2015, BPH  Admitted for CVA and mild subarachnoid bleed    Present on Admission:  COPD  BPH associated with nocturia  Hyperlipemia  CVA (cerebral vascular accident) (HCC)  ICH (intracerebral hemorrhage) (HCC)  Essential hypertension   Subarachnoid bleed (HCC) Possible small finding on CT neurology and NS is awre Hold off on aspirin or anticoagulaiton   Frequent neurochecks SBP <160  COPD Chronic stable cont home meds Currently non contributory  BPH associated with nocturia Contnue  home meds flomax and  proscar  Hyperlipemia Check lipid panel and treat if needed Given new CVA  CVA (cerebral vascular accident) (HCC)  - will admit based on TIA/CVA protocol,      Monitor on Tele       MRI Resulted - showing acute ischemic CVA       CTA   No emergent large vessel occlusion.  . 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   Bilateral carotid bifurcation atherosclerosis without hemodynamically significant stenosis.        Echo to evaluate for possible embolic source,        obtain cardiac enzymes,  ECG,   Lipid panel, TSH.        Order PT/OT evaluation.      passed swallow eval        Given potential bleed will hold off on aspirin and Plavix. Check echo lipid panel         keep BP <160       Neurology consulted Have seen pt in ER    ICH (intracerebral hemorrhage) (HCC) Hold off on aspirin Neurochecks every 4 hours Keep BP below 160 Labetalol as needed Neurology aware Neurosurgery aware. Repeat CT head at 6 AM  Essential hypertension Keep systolic blood pressure below 784 Resume home medications when able  Abnormal CXR Repeat chest x-ray in a.m.  Family denies any recent history of fevers cough or shortness of breath.  No evidence of infectious process otherwise.  Procalcitonin unremarkable Hold off on IV antibiotics for tonight     Other plan as per orders.  DVT prophylaxis:  SCD      Code Status:    Code Status: Not on file FULL CODE  as per patient  family  I had personally discussed CODE STATUS with patient and family  ACP none   Family Communication:   Family  at  Bedside  plan of care was discussed  with   Son, Daughter   Diet  Diet Orders (From admission, onward)     Start     Ordered   02/22/23 1236  Diet NPO time specified  Diet effective now        02/22/23 1235            Disposition Plan:     likely will need placement for rehabilitation                             Following barriers for  discharge:                                                       Will need consultants to evaluate patient prior to discharge       Consult Orders  (From admission, onward)           Start     Ordered   02/22/23 2052  Consult to hospitalist  Paged Dr.Karol Skarzynski  Once       Provider:  (Not yet assigned)  Question Answer Comment  Place call to: [facility] [id number] CVA   Reason for Consult Admit      02/22/23 2051   02/22/23 2035  Consult to hospitalist  Paged  Once       Provider:  (Not yet assigned)  Question Answer Comment  Place call to: [facility] [id number] CVA   Reason for Consult Admit      02/22/23 2035   02/22/23 2003  Consult to Neuro Hospitalist  Paged  Once       Provider:  (Not yet assigned)  Question Answer Comment  Place call to: oncall Neuro Hospitalist CVA   Reason for Consult Consult      02/22/23 2002   02/22/23 1504  Consult to Neuro Hospitalist  Pg by Viviann Spare  Once       Provider:  (Not yet assigned)  Question Answer Comment  Place call to: oncall Neuro Hospitalist AMS Small subarachnoid hemorrhage [id number]   Reason for Consult Consult      02/22/23 1504                               Would benefit from PT/OT eval prior to DC  Ordered                   Swallow eval - SLP ordered                                     Transition of care consulted                   Nutrition    consulted                     Consults called: neurology   Admission status:  ED Disposition     ED Disposition  Admit   Condition  --   Comment  Hospital Area:  [facility] [[id number]]  Level of Care: Progressive [102]  Admit to Progressive based on following criteria: NEUROLOGICAL AND NEUROSURGICAL complex patients with significant risk of instability, who do not meet ICU criteria, yet require close observation or frequent assessment (< / = every 2 - 4 hours) with medical / nursing intervention.  May admit patient to [facility] or [facility] if  equivalent level of care is available:: No  Covid Evaluation: Asymptomatic - no recent exposure (last 10 days) testing not required  Diagnosis: ICH (intracerebral hemorrhage) [facility]) [[id number]]  Admitting Physician: Therisa Doyne [[id number]]  Attending Physician: Therisa Doyne [[id number]]  Certification:: I certify this patient will need inpatient services for at least 2 midnights  Estimated Length of Stay: 2            inpatient     I Expect 2 midnight stay secondary to severity of patient's current illness need for inpatient interventions justified by the following:  hemodynamic instability    Severe lab/radiological/exam abnormalities including:   New CVA and subarachnoid bleed and extensive comorbidities including:   CHF history of stroke with residual deficits    That are currently affecting medical management.   I expect  patient to be hospitalized for 2 midnights requiring inpatient medical care.  Patient is at high risk for adverse outcome (such as loss of life or disability) if not treated.  Indication for inpatient stay as follows:  Severe change from baseline regarding mental status Hemodynamic instability despite maximal medical therapy,       Need for stroke workup    Level of care       progressive tele indefinitely please discontinue once patient no longer qualifies COVID-19 Labs   Kaleiyah Polsky 02/23/2023, 12:19 AM    [facility]     after 2 AM please page floor coverage PA If 7AM-7PM, please contact the day team taking care of the patient using [email or web address]

## 2023-02-22 NOTE — Assessment & Plan Note (Signed)
Check lipid panel and treat if needed Given new CVA

## 2023-02-22 NOTE — Subjective & Objective (Signed)
Pt presets with confusion LKW a week ago Sunday Pt not oriented to place, has been more unsteady limited eye sight  At baseline has trouble walking  Hx of CVA in the past  Reports ataxia

## 2023-02-22 NOTE — Assessment & Plan Note (Addendum)
-   will admit based on TIA/CVA protocol,      Monitor on Tele       MRI Resulted - showing acute ischemic CVA       CTA   No emergent large vessel occlusion.  . 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   Bilateral carotid bifurcation atherosclerosis without hemodynamically significant stenosis.        Echo to evaluate for possible embolic source,        obtain cardiac enzymes,  ECG,   Lipid panel, TSH.        Order PT/OT evaluation.      passed swallow eval        Given potential bleed will hold off on aspirin and Plavix. Check echo lipid panel         keep BP <160       Neurology consulted Have seen pt in ER

## 2023-02-22 NOTE — Consult Note (Signed)
NEUROLOGY CONSULTATION NOTE   Date of service: February 22, 2023 Patient Name: Eric [name] MRN:  [id number] DOB:  02/24/1941 Reason for consult: "confusion" Requesting Provider: Vanetta Mulders, MD _ _ _   _ __   _ __ _ _  __ __   _ __   __ _  History of Present Illness  Eric [name] is a 82 y.o. male with PMH significant for COPD, HTN, prior strokes, BPH, former smoker who is brought in by family for confusion since this AM.  Family reports gradual decline over the last few years. The do tell me that he worsened since he got covid vaccine and then declined further after a cataract surgery. Had a stroke in 2015. He was walking fine afterwards. At his baseline over the last yea, he is quiet, keeps to himself. Does not use any walking aids.  This AM, unclear what time he woke up. But got to the bathroom and was unable to urinate. Could not figure out how to do it. Family went in to help and reported that he can't see. He was brought in to the ED where [facility] demonstrated a small R frontal SAH. He had limited MRI brain as he was unable to stay still for the MRI which demonstrated a small left posterior left parietal lobe stroke.  Neurology consulted for further evaluation. He is disoriented and doe snot recall date, month, year and thinks he is at home. Much of the history obtained from patient's son and daughter at the bedside. Patient tells me that he was just not feeling right today but cannot elaborate it much further than that despite trying.  LKW: unclear, probably last night when he went to bed mRS: 3 tNKASE: not offered 2/2 small R frontal SAH. Thrombectomy: not offered 2/2 low suspicion for LVO NIHSS components Score: Comment  1a Level of Conscious 0[x]  1[]  2[]  3[]      1b LOC Questions 0[]  1[]  2[x]       1c LOC Commands 0[x]  1[]  2[]       2 Best Gaze 0[x]  1[]  2[]       3 Visual 0[]  1[]  2[x]  3[]      4 Facial Palsy 0[x]  1[]  2[]  3[]      5a Motor Arm - left 0[x]  1[]  2[]  3[]  4[]  UN[]     5b Motor Arm - Right 0[x]  1[]  2[]  3[]  4[]  UN[]    6a Motor Leg - Left 0[x]  1[]  2[]  3[]  4[]  UN[]    6b Motor Leg - Right 0[x]  1[]  2[]  3[]  4[]  UN[]    7 Limb Ataxia 0[x]  1[]  2[]  3[]  UN[]     8 Sensory 0[x]  1[]  2[]  UN[]      9 Best Language 0[x]  1[]  2[]  3[]      10 Dysarthria 0[x]  1[]  2[]  UN[]      11 Extinct. and Inattention 0[x]  1[]  2[]       TOTAL: 4       ROS   Constitutional Denies weight loss, fever and chills.   HEENT + changes in vision with intact hearing.   Respiratory Denies SOB and cough.   CV Denies palpitations and CP   GI Denies abdominal pain, nausea, vomiting and diarrhea.   GU Denies dysuria and urinary frequency.   MSK Denies myalgia and joint pain.   Skin Denies rash and pruritus.   Neurological Denies headache and syncope.   Psychiatric Denies recent changes in mood. Denies anxiety and depression.    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   Hypertension    Stroke (HCC) 06/2014   Past Surgical History:  Procedure Laterality Date   bilateral breast removal     COLONOSCOPY  04/24/05   Family History  Problem Relation Age of Onset   Arthritis Mother    Diabetes Father    Diabetes Other    Hypertension Other    Social History   Socioeconomic History   Marital status: Married    Spouse name: Nellie   Number of children: 6   Years of education: 10th   Highest education level: Not on file  Occupational History   Occupation: retired  Tobacco Use   Smoking status: Former    Types: Cigarettes    Quit date: 09/22/1999    Years since quitting: 23.4   Smokeless tobacco: Never  Substance and Sexual Activity   Alcohol use: No    Alcohol/week: 0.0 standard drinks of alcohol   Drug use: No   Sexual activity: Yes  Other Topics  Concern   Not on file  Social History Narrative   Patient lives at home with wife, consumes no caffeine         Social Determinants of Health   Financial Resource Strain: Not on file  Food Insecurity: Not on file  Transportation Needs: Not on file  Physical Activity: Not on file  Stress: Not on file  Social Connections: Not on file   Allergies  Allergen Reactions   Hctz [Hydrochlorothiazide]     Peeing too much    Medications  (Not in a hospital admission)    Vitals   Vitals:   02/22/23 1227 02/22/23 1229 02/22/23 1652  BP: (!) 150/82  (!) 162/73  Pulse: 74  66  Resp: 16  16  Temp: 98.5 F (36.9 C)  98 F (36.7 C)  TempSrc:   Oral  SpO2: 97%  100%  Weight:  66.7 kg   Height:  5\' 8"  (1.727 m)      Body mass index is 22.36 kg/m.  Physical Exam   General: Laying comfortably in bed; in no acute distress.  HENT: Normal oropharynx and mucosa. Normal external appearance of ears and nose.  Neck: Supple, no pain or tenderness  CV: No JVD. No peripheral edema.  Pulmonary: Symmetric Chest rise. Normal respiratory effort.  Abdomen: Soft to touch, non-tender.  Ext: No cyanosis, edema, or deformity  Skin: No rash. Normal palpation of skin.   Musculoskeletal: Normal digits and nails by inspection. No clubbing.   Neurologic Examination  Mental status/Cognition: Alert, oriented to self, place, month and year, good attention.  Speech/language: Fluent, comprehension intact, object naming intact, repetition intact.  Cranial nerves:   CN II Pupils equal and reactive to light, L hemianopsia.   CN III,IV,VI EOM intact, no gaze preference or deviation, no nystagmus    CN V normal sensation in V1, V2, and V3 segments bilaterally    CN VII no asymmetry, no nasolabial fold flattening    CN VIII normal hearing to speech    CN IX & X normal palatal elevation, no uvular deviation    CN XI 5/5 head turn and 5/5 shoulder shrug bilaterally    CN XII midline tongue protrusion     Motor:  Muscle bulk: poor, tone  normal Mvmt Root Nerve  Muscle Right Left Comments  SA C5/6 Ax Deltoid 4 4   EF C5/6 Mc Biceps     EE C6/7/8 Rad Triceps     WF C6/7 Med FCR     WE C7/8 PIN ECU     F Ab C8/T1 U ADM/FDI 5 5   HF L1/2/3 Fem Illopsoas 4 4   KE L2/3/4 Fem Quad     DF L4/5 D Peron Tib Ant     PF S1/2 Tibial Grc/Sol      Sensation:  Light touch Intact throughout   Pin prick    Temperature    Vibration   Proprioception    Coordination/Complex Motor:  - Finger to Nose intact BL - Heel to shin unable to do - Rapid alternating movement are slowed. - Gait: deferred for patient safety.  Labs   CBC:  Recent Labs  Lab 02/22/23 1236 02/22/23 1248  WBC 10.0  --   NEUTROABS 7.8*  --   HGB 12.0* 13.6  HCT 37.7* 40.0  MCV 71.9*  --   PLT 404*  --     Basic Metabolic Panel:  Lab Results  Component Value Date   NA 138 02/22/2023   K 4.2 02/22/2023   CO2 24 02/22/2023   GLUCOSE 112 (H) 02/22/2023   BUN 7 (L) 02/22/2023   CREATININE 0.90 02/22/2023   CALCIUM 9.4 02/22/2023   GFRNONAA >60 02/22/2023   GFRAA >60 11/08/2015   Lipid Panel:  Lab Results  Component Value Date   LDLCALC 85 06/28/2022   HgbA1c: No results found for: "HGBA1C" Urine Drug Screen: No results found for: "LABOPIA", "COCAINSCRNUR", "LABBENZ", "AMPHETMU", "THCU", "LABBARB"  Alcohol Level     Component Value Date/Time   ETH <10 02/22/2023 1236    CT Head without contrast(Personally reviewed): Small R frontal SAH  CT angio Head and Neck with contrast: pending  MRI Brain(Personally reviewed): Incomplete study with only DWI/ADC. Has L parieto-occipital lobe infarct  Impression   GAVINN [name] is a 82 y.o. male  with PMH significant for COPD, HTN, prior strokes, BPH, former smoker who is brought in by family for confusion since this AM, difficulty understanding and worsening vision. His neurologic examination is notable for R hemianopsia, disoriented.  CT Head with small R  frontal SAH and MRI Brain with a small L parietal lobe infarct.  Recommendations  - Frequent Neuro checks per stroke unit protocol - Recommend Vascular imaging with CTA head and neck - Recommend obtaining TTE - Recommend obtaining Lipid panel with LDL - Please start statin if LDL > 70 - Recommend HbA1c to evaluate for diabetes and how well it is controlled. - Antithrombotic - hold off on further antiplatelet and DVT prophylaxis in the setting of small SAH. - SBP goal - keep below 160 for now. - Recommend Telemetry monitoring for arrythmia - Recommend bedside swallow screen prior to PO intake. - Stroke education booklet - Recommend PT/OT/SLP consult - stroke team to follow. ______________________________________________________________________   Thank you for the opportunity to take part in the care of this patient. If you have any further questions, please contact the neurology consultation attending.  Signed,  Erick Blinks [facility] _ _ _   _ __   _ __ _ _  __ __   _ __   __ _

## 2023-02-22 NOTE — Assessment & Plan Note (Signed)
Contnue home meds flomax and  proscar

## 2023-02-22 NOTE — Progress Notes (Signed)
Case d/w Dr. Pernell Dupre. Eric [name]. Pt presented w/ AMS and altered gait x1 week. CTH showing small SAH to R frontal lobe. Agree w/ plan for MRI w/ and w/o per Neurology. Pending MRI results, consider CTA head. No neurosurgical intervention indicated at this time.   Call w questions/concerns.

## 2023-02-22 NOTE — ED Provider Notes (Addendum)
EMERGENCY DEPARTMENT AT [facility] Provider Note   CSN: [id number] Arrival date & time: 02/22/23  1208     History  Chief Complaint  Patient presents with   Altered Mental Status    Eric [name] is a 82 y.o. male.  Patient brought in by family.  Patient is being confused somewhat since Monday.  But worse today.  Patient does not walk well on a good day but walking is definitely worse today.  Patient will follow commands denies any headache or any pain anywhere.  Patient's had a history of an old stroke.  Has a history of hypertension.  Patient able to move lower extremities and upper extremities but does seem to be weak in the lower extremities.  Patient is verbalizing.  Patient feels like he is going to fall over when he tries to walk so he thinks that there is an abnormal gait.  Past medical history significant for COPD hypertension stroke in 2015.  Patient is a former smoker quit in 2001.  Patient had head CT done in triage which shows a small frontal subarachnoid hemorrhage.  Blood pressure out of triage was 150/82 oxygen saturation is 97%.       Home Medications Prior to Admission medications   Medication Sig Start Date End Date Taking? Authorizing Provider  amLODipine-benazepril (LOTREL) 5-20 MG capsule TAKE 1 CAPSULE BY MOUTH EVERY DAY 10/28/22   Plotnikov, Georgina Quint, MD  aspirin 81 MG EC tablet TAKE 1 TABLET BY MOUTH EVERY DAY 11/27/16   Micki Riley, MD  Cholecalciferol ([employer] D3) 50 MCG (2000 UT) CAPS Take 1 capsule (2,000 Units total) by mouth daily. 04/24/21   Plotnikov, Georgina Quint, MD  Cyanocobalamin (VITAMIN B-12) 1000 MCG SUBL Place 1 tablet (1,000 mcg total) under the tongue daily. 04/24/21   Plotnikov, Georgina Quint, MD  ferrous sulfate 325 (65 FE) MG tablet Take 1 tablet (325 mg total) by mouth 2 (two) times daily with a meal. 12/29/22 12/29/23  Plotnikov, Georgina Quint, MD  finasteride (PROSCAR) 5 MG tablet Take 1 tablet (5 mg total) by mouth daily.  06/26/22   Plotnikov, Georgina Quint, MD  oxybutynin (DITROPAN-XL) 5 MG 24 hr tablet TAKE 1 TABLET (5 MG TOTAL) BY MOUTH AT BEDTIME. FOR FREQUENT URINATION 10/29/22   Plotnikov, Georgina Quint, MD  pantoprazole (PROTONIX) 40 MG tablet Take 1 tablet (40 mg total) by mouth daily. 01/02/23   Plotnikov, Georgina Quint, MD  tamsulosin (FLOMAX) 0.4 MG CAPS capsule Take 1 capsule (0.4 mg total) by mouth daily. Patient not taking: Reported on 12/26/2022 06/26/22   Plotnikov, Georgina Quint, MD  traMADol (ULTRAM) 50 MG tablet TAKE 1 TABLET 3 TIMES A DAY AS NEEDED SEVERE PAIN 02/14/23   Plotnikov, Georgina Quint, MD      Allergies    Hctz [hydrochlorothiazide]    Review of Systems   Review of Systems  Constitutional:  Negative for chills and fever.  HENT:  Negative for ear pain and sore throat.   Eyes:  Negative for pain and visual disturbance.  Respiratory:  Negative for cough and shortness of breath.   Cardiovascular:  Negative for chest pain and palpitations.  Gastrointestinal:  Negative for abdominal pain and vomiting.  Genitourinary:  Negative for dysuria and hematuria.  Musculoskeletal:  Negative for arthralgias and back pain.  Skin:  Negative for color change and rash.  Neurological:  Positive for weakness. Negative for seizures and syncope.  Psychiatric/Behavioral:  Positive for confusion.   All other systems  reviewed and are negative.   Physical Exam Updated Vital Signs BP (!) 150/82   Pulse 74   Temp 98.5 F (36.9 C)   Resp 16   Ht 1.727 m (5\' 8" )   Wt 66.7 kg   SpO2 97%   BMI 22.36 kg/m  Physical Exam Vitals and nursing note reviewed.  Constitutional:      General: He is not in acute distress.    Appearance: Normal appearance. He is well-developed.  HENT:     Head: Normocephalic and atraumatic.  Eyes:     Extraocular Movements: Extraocular movements intact.     Conjunctiva/sclera: Conjunctivae normal.     Pupils: Pupils are equal, round, and reactive to light.  Cardiovascular:     Rate and  Rhythm: Normal rate and regular rhythm.     Heart sounds: No murmur heard. Pulmonary:     Effort: Pulmonary effort is normal. No respiratory distress.     Breath sounds: Normal breath sounds.  Abdominal:     Palpations: Abdomen is soft.     Tenderness: There is no abdominal tenderness.  Musculoskeletal:        General: No swelling.     Cervical back: Normal range of motion and neck supple. No rigidity.  Skin:    General: Skin is warm and dry.     Capillary Refill: Capillary refill takes less than 2 seconds.  Neurological:     Mental Status: He is alert.     Motor: Weakness present.     Gait: Gait abnormal.  Psychiatric:        Mood and Affect: Mood normal.     ED Results / Procedures / Treatments   Labs (all labs ordered are listed, but only abnormal results are displayed) Labs Reviewed  CBC - Abnormal; Notable for the following components:      Result Value   Hemoglobin 12.0 (*)    HCT 37.7 (*)    MCV 71.9 (*)    MCH 22.9 (*)    RDW 24.3 (*)    Platelets 404 (*)    All other components within normal limits  DIFFERENTIAL - Abnormal; Notable for the following components:   Neutro Abs 7.8 (*)    All other components within normal limits  COMPREHENSIVE METABOLIC PANEL - Abnormal; Notable for the following components:   Glucose, Bld 113 (*)    BUN 7 (*)    All other components within normal limits  CBG MONITORING, ED - Abnormal; Notable for the following components:   Glucose-Capillary 114 (*)    All other components within normal limits  I-STAT CHEM 8, ED - Abnormal; Notable for the following components:   BUN 7 (*)    Glucose, Bld 112 (*)    All other components within normal limits  PROTIME-INR  APTT  ETHANOL  URINALYSIS, ROUTINE W REFLEX MICROSCOPIC  CBG MONITORING, ED    EKG EKG Interpretation  Date/Time:  Saturday February 22 2023 12:38:19 EDT Ventricular Rate:  77 PR Interval:  158 QRS Duration: 86 QT Interval:  348 QTC Calculation: 393 R  Axis:   56 Text Interpretation: Normal sinus rhythm Normal ECG When compared with ECG of 08-Nov-2015 21:38, PREVIOUS ECG IS PRESENT Confirmed by Vanetta Mulders [phone]) on 02/22/2023 2:52:10 PM  Radiology CT HEAD WO CONTRAST  Addendum Date: 02/22/2023   ADDENDUM REPORT: 02/22/2023 13:28 ADDENDUM: Critical Value/emergent results were called by telephone at the time of interpretation on 02/22/2023 at 1:27 pm to provider Dr. Durwin Nora, who verbally  acknowledged these results. Electronically Signed   By: Kennith Center M.D.   On: 02/22/2023 13:28   Result Date: 02/22/2023 CLINICAL DATA:  Memory loss EXAM: CT HEAD WITHOUT CONTRAST TECHNIQUE: Contiguous axial images were obtained from the base of the skull through the vertex without intravenous contrast. RADIATION DOSE REDUCTION: This exam was performed according to the departmental dose-optimization program which includes automated exposure control, adjustment of the mA and/or kV according to patient size and/or use of iterative reconstruction technique. COMPARISON:  07/23/2013.  Brain MRI 11/09/2015 FINDINGS: Brain: There is a small focus of subarachnoid hemorrhage in the anterior right frontal lobe (axial 13/2 and well seen coronal 14/4. Right temporoparietal encephalomalacia corresponding to the area of hemorrhage seen on remote CT from 2014. High right frontal encephalomalacia appears progressive since 2014. High left parietal nonacute infarct/encephalomalacia. Encephalomalacia in the left occipital lobe is compatible with remote infarct. Lacunar infarcts are noted in the left cerebellum. Lacunar infarcts are seen in the basal ganglia bilaterally diffuse loss of parenchymal volume is consistent with atrophy. Patchy low attenuation in the deep hemispheric and periventricular white matter is nonspecific, but likely reflects chronic microvascular ischemic demyelination. Vascular: No hyperdense vessel or unexpected calcification. Skull: No evidence for fracture. No  worrisome lytic or sclerotic lesion. Sinuses/Orbits: The visualized paranasal sinuses and mastoid air cells are clear. Visualized portions of the globes and intraorbital fat are unremarkable. Other: None. IMPRESSION: 1. Small focus of subarachnoid hemorrhage in the anterior right frontal lobe. 2. Multifocal areas of encephalomalacia/chronic infarct involving both cerebral hemispheres and the left cerebellum. 3. Atrophy with chronic small vessel ischemic disease. Electronically Signed: By: Kennith Center M.D. On: 02/22/2023 13:19    Procedures Procedures    Medications Ordered in ED Medications  sodium chloride flush (NS) 0.9 % injection 3 mL (3 mLs Intravenous Not Given 02/22/23 1325)  0.9 %  sodium chloride infusion (has no administration in time range)    ED Course/ Medical Decision Making/ A&P                             Medical Decision Making Amount and/or Complexity of Data Reviewed Labs: ordered. Radiology: ordered.  Risk Prescription drug management. Decision regarding hospitalization.   Patient alert and will follow commands.  But family says he is definitely had increased confusion and difficulty walking since Monday.  Labs here i-STAT without significant abnormalities INR is normal CBC hemoglobin 12 white count 10 platelets 404K complete metabolic panel liver function test normal electrolytes normal and renal function normal.  Alcohol less than 10.  Blood sugar 114 CT head small focus subarachnoid hemorrhage in the anterior right frontal lobe.  Multiple focal areas of chronic infarct involving both cerebral hemispheres in the left cerebellum.  Based on this patient most likely will need MRI triage did order CT angio head and neck which has not been done yet.  Will discuss with both neurohospitalist as well as neurosurgery.  Will get nurse to put him on monitor and get blood pressure rechecks.  Blood pressure out in triage was a little marginal at 152/82 he may need tighter  control of his blood pressure.  CRITICAL CARE Performed by: Vanetta Mulders Total critical care time: 40 minutes Critical care time was exclusive of separately billable procedures and treating other patients. Critical care was necessary to treat or prevent imminent or life-threatening deterioration. Critical care was time spent personally by me on the following activities: development of treatment  plan with patient and/or surrogate as well as nursing, discussions with consultants, evaluation of patient's response to treatment, examination of patient, obtaining history from patient or surrogate, ordering and performing treatments and interventions, ordering and review of laboratory studies, ordering and review of radiographic studies, pulse oximetry and re-evaluation of patient's condition.   MRI shows evidence of an acute infarct or acute ischemia within the posterior left parietal lobe.  Will discuss with on-call neurohospitalist.  We do have CT angio head and neck ordered as well.   Final Clinical Impression(s) / ED Diagnoses Final diagnoses:  Altered mental status, unspecified altered mental status type  Weakness generalized    Rx / DC Orders ED Discharge Orders     None         Vanetta Mulders, MD 02/22/23 1610    Vanetta Mulders, MD 02/22/23 2003

## 2023-02-22 NOTE — ED Notes (Signed)
EDP at bedside  

## 2023-02-22 NOTE — ED Triage Notes (Addendum)
Daughter states she was called by her brother this am states patient was confused .last known nornal Sunday. . Denies pain. Unable to state where he is or what day it is. Son states he has very limited sight however today was worse. Was doing the opposite what was ask of him. Onset 2 hours ago. Steady gait with help , was afraid he was going to fall.

## 2023-02-22 NOTE — ED Notes (Signed)
Patient transported to CT 

## 2023-02-22 NOTE — ED Notes (Signed)
Patients family frustrated that no one is telling them anything regarding the patients scans. RN explained that we were waiting on one more scan to be completed. EDP notified that the patients family would like to get the scan completed as soon as possible.

## 2023-02-23 ENCOUNTER — Inpatient Hospital Stay (HOSPITAL_COMMUNITY): Payer: Medicare Other

## 2023-02-23 DIAGNOSIS — R4182 Altered mental status, unspecified: Secondary | ICD-10-CM | POA: Diagnosis not present

## 2023-02-23 DIAGNOSIS — R531 Weakness: Secondary | ICD-10-CM

## 2023-02-23 DIAGNOSIS — I639 Cerebral infarction, unspecified: Secondary | ICD-10-CM | POA: Diagnosis not present

## 2023-02-23 DIAGNOSIS — I610 Nontraumatic intracerebral hemorrhage in hemisphere, subcortical: Secondary | ICD-10-CM | POA: Diagnosis not present

## 2023-02-23 DIAGNOSIS — I6389 Other cerebral infarction: Secondary | ICD-10-CM

## 2023-02-23 DIAGNOSIS — R9389 Abnormal findings on diagnostic imaging of other specified body structures: Secondary | ICD-10-CM | POA: Diagnosis present

## 2023-02-23 LAB — ECHOCARDIOGRAM COMPLETE
Area-P 1/2: 2.17 cm2
Calc EF: 47.1 %
Height: 68 in
S' Lateral: 3.4 cm
Single Plane A2C EF: 49.8 %
Single Plane A4C EF: 45.5 %
Weight: 2352.75 oz

## 2023-02-23 LAB — URINALYSIS, COMPLETE (UACMP) WITH MICROSCOPIC
Bacteria, UA: NONE SEEN
Bilirubin Urine: NEGATIVE
Glucose, UA: NEGATIVE mg/dL
Hgb urine dipstick: NEGATIVE
Ketones, ur: NEGATIVE mg/dL
Leukocytes,Ua: NEGATIVE
Nitrite: NEGATIVE
Protein, ur: NEGATIVE mg/dL
Specific Gravity, Urine: 1.02 (ref 1.005–1.030)
pH: 7 (ref 5.0–8.0)

## 2023-02-23 LAB — COMPREHENSIVE METABOLIC PANEL
ALT: 15 U/L (ref 0–44)
AST: 16 U/L (ref 15–41)
Albumin: 3.6 g/dL (ref 3.5–5.0)
Alkaline Phosphatase: 52 U/L (ref 38–126)
Anion gap: 11 (ref 5–15)
BUN: 6 mg/dL — ABNORMAL LOW (ref 8–23)
CO2: 21 mmol/L — ABNORMAL LOW (ref 22–32)
Calcium: 9.2 mg/dL (ref 8.9–10.3)
Chloride: 105 mmol/L (ref 98–111)
Creatinine, Ser: 0.85 mg/dL (ref 0.61–1.24)
GFR, Estimated: >60 mL/min (ref >60)
Glucose, Bld: 106 mg/dL — ABNORMAL HIGH (ref 70–99)
Potassium: 3.8 mmol/L (ref 3.5–5.1)
Sodium: 137 mmol/L (ref 135–145)
Total Bilirubin: 1 mg/dL (ref 0.3–1.2)
Total Protein: 7 g/dL (ref 6.5–8.1)

## 2023-02-23 LAB — CBC
HCT: 36.3 % — ABNORMAL LOW (ref 39.0–52.0)
Hemoglobin: 11.7 g/dL — ABNORMAL LOW (ref 13.0–17.0)
MCH: 23.1 pg — ABNORMAL LOW (ref 26.0–34.0)
MCHC: 32.2 g/dL (ref 30.0–36.0)
MCV: 71.7 fL — ABNORMAL LOW (ref 80.0–100.0)
Platelets: 363 10*3/uL (ref 150–400)
RBC: 5.06 MIL/uL (ref 4.22–5.81)
RDW: 24.4 % — ABNORMAL HIGH (ref 11.5–15.5)
WBC: 12.3 10*3/uL — ABNORMAL HIGH (ref 4.0–10.5)
nRBC: 0 % (ref 0.0–0.2)

## 2023-02-23 LAB — HEMOGLOBIN A1C
Hgb A1c MFr Bld: 5.8 % — ABNORMAL HIGH (ref 4.8–5.6)
Mean Plasma Glucose: 119.76 mg/dL

## 2023-02-23 LAB — LIPID PANEL
Cholesterol: 183 mg/dL (ref 0–200)
HDL: 59 mg/dL (ref >40)
LDL Cholesterol: 112 mg/dL — ABNORMAL HIGH (ref 0–99)
Total CHOL/HDL Ratio: 3.1 RATIO
Triglycerides: 61 mg/dL (ref <150)
VLDL: 12 mg/dL (ref 0–40)

## 2023-02-23 LAB — TROPONIN I (HIGH SENSITIVITY): Troponin I (High Sensitivity): 15 ng/L (ref <18)

## 2023-02-23 LAB — PREALBUMIN: Prealbumin: 19 mg/dL (ref 18–38)

## 2023-02-23 LAB — PHOSPHORUS: Phosphorus: 3 mg/dL (ref 2.5–4.6)

## 2023-02-23 LAB — MAGNESIUM: Magnesium: 1.7 mg/dL (ref 1.7–2.4)

## 2023-02-23 LAB — PROCALCITONIN: Procalcitonin: <0.1 ng/mL

## 2023-02-23 MED ORDER — ROSUVASTATIN CALCIUM 20 MG PO TABS
20.0000 mg | ORAL_TABLET | Freq: Every day | ORAL | Status: DC
  Administered 2023-02-23 – 2023-02-24 (×2): 20 mg via ORAL
  Filled 2023-02-23 (×2): qty 1

## 2023-02-23 MED ORDER — AMLODIPINE BESYLATE 5 MG PO TABS
5.0000 mg | ORAL_TABLET | Freq: Every day | ORAL | Status: DC
  Administered 2023-02-23 – 2023-02-24 (×2): 5 mg via ORAL
  Filled 2023-02-23 (×2): qty 1

## 2023-02-23 MED ORDER — ASPIRIN 81 MG PO TBEC
81.0000 mg | DELAYED_RELEASE_TABLET | Freq: Every day | ORAL | Status: DC
  Administered 2023-02-23 – 2023-02-24 (×2): 81 mg via ORAL
  Filled 2023-02-23 (×2): qty 1

## 2023-02-23 MED ORDER — BENAZEPRIL HCL 20 MG PO TABS
20.0000 mg | ORAL_TABLET | Freq: Every day | ORAL | Status: DC
  Administered 2023-02-23 – 2023-02-24 (×2): 20 mg via ORAL
  Filled 2023-02-23 (×2): qty 1

## 2023-02-23 MED ORDER — LABETALOL HCL 5 MG/ML IV SOLN
10.0000 mg | INTRAVENOUS | Status: DC | PRN
  Administered 2023-02-23 (×2): 10 mg via INTRAVENOUS
  Filled 2023-02-23 (×2): qty 4

## 2023-02-23 MED ORDER — CLOPIDOGREL BISULFATE 75 MG PO TABS
75.0000 mg | ORAL_TABLET | Freq: Every day | ORAL | Status: DC
  Administered 2023-02-23 – 2023-02-24 (×2): 75 mg via ORAL
  Filled 2023-02-23 (×2): qty 1

## 2023-02-23 NOTE — Assessment & Plan Note (Signed)
Keep systolic blood pressure below 161 Resume home medications when able

## 2023-02-23 NOTE — Progress Notes (Signed)
Echocardiogram 2D Echocardiogram has been performed.  Warren Lacy Eudelia Hiltunen RDCS 02/23/2023, 1:18 PM

## 2023-02-23 NOTE — Progress Notes (Signed)
Inpatient Rehab Admissions Coordinator:  ? ?Per therapy recommendations,  patient was screened for CIR candidacy by Maverick Dieudonne, MS, CCC-SLP. At this time, Pt. Appears to be a a potential candidate for CIR. I will place   order for rehab consult per protocol for full assessment. Please contact me any with questions. ? ?Jeraldine Primeau, MS, CCC-SLP ?Rehab Admissions Coordinator  ?[phone] (celll) ?[phone] (office) ? ?

## 2023-02-23 NOTE — Evaluation (Signed)
Physical Therapy Evaluation Patient Details Name: Eric [name] MRN: [id number] DOB: [birth year] Today's Date: 02/23/2023  History of Present Illness  Pt is an 82 y.o. male who presented 02/22/23 with AMS and R hemianopsia. CT head showed small focus of subarachnoid hemorrhage in the anterior right frontal lobe. MRI showed small area of acute ischemia within the posterior left parietal lobe. PMH: COPD, HTN, prior strokes, BPH, former smoker   Clinical Impression  Pt presents with condition above and deficits mentioned below, see PT Problem List. PTA, he was mod I holding onto furniture for household mobility but needed +1 assist and a SPC for outdoor mobility. Pt resides with his family in a 1-level house with 2 STE. Currently, pt is restless and mildly impulsive and demonstrates deficits in cognition, balance, activity tolerance, bil lower extremity strength (L slightly weaker than R), and L lower extremity sensation. He is at high risk for falls, requiring modA for bed mobility and transfers and mod-maxA to ambulate short bedroom distances with a RW. He needs repeated cues and guidance to keep his hands on the RW with standing mobility. As pt has good family support, is participatory, and has had a drastic functional decline, he could greatly benefit from intensive inpatient rehab, >3 hours/day. Will continue to follow acutely.     Recommendations for follow up therapy are one component of a multi-disciplinary discharge planning process, led by the attending physician.  Recommendations may be updated based on patient status, additional functional criteria and insurance authorization.  Follow Up Recommendations       Assistance Recommended at Discharge Frequent or constant Supervision/Assistance  Patient can return home with the following  Two people to help with walking and/or transfers;A lot of help with bathing/dressing/bathroom;Assistance with feeding;Assistance with  cooking/housework;Direct supervision/assist for medications management;Direct supervision/assist for financial management;Assist for transportation;Help with stairs or ramp for entrance    Equipment Recommendations Rolling walker (2 wheels);BSC/3in1  Recommendations for Other Services  Rehab consult    Functional Status Assessment Patient has had a recent decline in their functional status and demonstrates the ability to make significant improvements in function in a reasonable and predictable amount of time.     Precautions / Restrictions Precautions Precautions: Fall Precaution Comments: watch BP Restrictions Weight Bearing Restrictions: No      Mobility  Bed Mobility Overal bed mobility: Needs Assistance Bed Mobility: Supine to Sit, Sit to Supine     Supine to sit: Mod assist, HOB elevated Sit to supine: Mod assist, HOB elevated   General bed mobility comments: ModA to guide bil hands to R bed rail to grab and pull to sit up R EOB, guiding his legs off EOB as he would repeatedly lift them back up onto the bed. ModA to direct his trunk and lift his legs to return to supine, pt having difficulty understanding cues to lay down.    Transfers Overall transfer level: Needs assistance Equipment used: Rolling walker (2 wheels), 1 person hand held assist Transfers: Sit to/from Stand, Bed to chair/wheelchair/BSC Sit to Stand: Mod assist   Step pivot transfers: Mod assist       General transfer comment: ModA to shift weight anteriorly and power up to stand, needing guidance for hand placement with each transfer. Sit to stand 1x from EOB to RW, 1x from recliner to HHA. Step pivot with HHA recliner to bed, needing cues to keep hands on therapist and not prevent transfer but holding onto chair.    Ambulation/Gait Ambulation/Gait assistance: Mod assist,  Max assist Gait Distance (Feet): 15 Feet (x2 bouts of ~15 ft > ~2 ft) Assistive device: Rolling walker (2 wheels), 1 person hand  held assist Gait Pattern/deviations: Step-to pattern, Decreased step length - right, Decreased step length - left, Decreased stride length, Decreased dorsiflexion - right, Decreased dorsiflexion - left, Narrow base of support, Trunk flexed, Shuffle Gait velocity: reduced Gait velocity interpretation: <1.31 ft/sec, indicative of household ambulator   General Gait Details: Pt takes slow, shuffling, unsteady steps with narrow BOS, needing verbal and tactile cues to widen his stance. Pt needs cues to keep his hands on the RW rather than reaching for other objects throughout. Poor clearance and step length bil, needing verbal and tactile cues and mod-maxA to advance legs, manage RW, and maintain balance as pt would flex his trunk and legs more as he fatigued.  Stairs            Wheelchair Mobility    Modified Rankin (Stroke Patients Only) Modified Rankin (Stroke Patients Only) Pre-Morbid Rankin Score: Moderate disability Modified Rankin: Moderately severe disability     Balance Overall balance assessment: Needs assistance Sitting-balance support: Feet supported, Single extremity supported, Bilateral upper extremity supported Sitting balance-Leahy Scale: Poor     Standing balance support: Bilateral upper extremity supported, Single extremity supported, During functional activity Standing balance-Leahy Scale: Poor Standing balance comment: Reliant on UE support and mod-maxA                             Pertinent Vitals/Pain Pain Assessment Pain Assessment: Faces Faces Pain Scale: No hurt Pain Intervention(s): Monitored during session    Home Living Family/patient expects to be discharged to:: Private residence Living Arrangements: Spouse/significant other;Children Available Help at Discharge: Family;Available 24 hours/day Type of Home: House Home Access: Stairs to enter Entrance Stairs-Rails: None Entrance Stairs-Number of Steps: 2 (in front; 1 step in back)    Home Layout: One level Home Equipment: Toilet riser;Cane - single point      Prior Function Prior Level of Function : Needs assist             Mobility Comments: uses cane outside with +1 assist, furniture surfs in home mod I, hx of falls ADLs Comments: falls primarily in bathroom, like with tub transfers; likes to take baths; able to perform pericare and dress himself; family manages his meds and finances; does not drive; very sharp cognitively per family     Hand Dominance        Extremity/Trunk Assessment   Upper Extremity Assessment Upper Extremity Assessment: Defer to OT evaluation    Lower Extremity Assessment Lower Extremity Assessment: Generalized weakness;LLE deficits/detail;RLE deficits/detail RLE Deficits / Details: Gross MMT scores of 4 to 4+; denied numbness/tingling LLE Deficits / Details: Gross MMT scores of 4; reports mild numbness throughout leg LLE Sensation: decreased light touch    Cervical / Trunk Assessment Cervical / Trunk Assessment: Kyphotic  Communication   Communication: HOH  Cognition Arousal/Alertness: Awake/alert Behavior During Therapy: Restless, Impulsive Overall Cognitive Status: Impaired/Different from baseline Area of Impairment: Orientation, Attention, Memory, Following commands, Safety/judgement, Awareness, Problem solving                 Orientation Level: Disoriented to, Time (did not ask location or situation) Current Attention Level: Sustained Memory: Decreased short-term memory Following Commands: Follows one step commands consistently, Follows one step commands with increased time, Follows multi-step commands inconsistently Safety/Judgement: Decreased awareness of safety, Decreased awareness of deficits  Awareness: Intellectual Problem Solving: Requires verbal cues, Requires tactile cues, Difficulty sequencing, Slow processing, Decreased initiation General Comments: Pt very restless and can be impulsive, repeatedly  bringing his legs back up on the bed when cued to sit up and him verbally agreeing. Pt also restlessly trying to exit recliner then lay back in recliner again. Pt unaware of current year but did state it was June. Pt reaching out for objects rather than keeping his hands on the RW as repeatedly cued. Poor safety awareness.        General Comments General comments (skin integrity, edema, etc.): SBP > 180 end of session, RN aware; family present and supportive throughout    Exercises     Assessment/Plan    PT Assessment Patient needs continued PT services  PT Problem List Decreased strength;Decreased activity tolerance;Decreased balance;Decreased mobility;Decreased coordination;Decreased cognition;Decreased safety awareness;Decreased knowledge of use of DME;Impaired sensation       PT Treatment Interventions DME instruction;Gait training;Stair training;Functional mobility training;Therapeutic activities;Therapeutic exercise;Neuromuscular re-education;Balance training;Cognitive remediation;Patient/family education    PT Goals (Current goals can be found in the Care Plan section)  Acute Rehab PT Goals Patient Stated Goal: agreeable to session PT Goal Formulation: With patient/family Time For Goal Achievement: 03/09/23 Potential to Achieve Goals: Good    Frequency Min 4X/week     Co-evaluation               AM-PAC PT "6 Clicks" Mobility  Outcome Measure Help needed turning from your back to your side while in a flat bed without using bedrails?: A Lot Help needed moving from lying on your back to sitting on the side of a flat bed without using bedrails?: A Lot Help needed moving to and from a bed to a chair (including a wheelchair)?: A Lot Help needed standing up from a chair using your arms (e.g., wheelchair or bedside chair)?: A Lot Help needed to walk in hospital room?: Total (<20 ft) Help needed climbing 3-5 steps with a railing? : Total 6 Click Score: 10    End of  Session Equipment Utilized During Treatment: Gait belt Activity Tolerance: Patient tolerated treatment well Patient left: in bed;with call bell/phone within reach;with bed alarm set;with family/visitor present Nurse Communication: Mobility status;Other (comment) (BP) PT Visit Diagnosis: Unsteadiness on feet (R26.81);Other abnormalities of gait and mobility (R26.89);Muscle weakness (generalized) (M62.81);Difficulty in walking, not elsewhere classified (R26.2);History of falling (Z91.81);Repeated falls (R29.6);Other symptoms and signs involving the nervous system (R29.898)    Time: 3086-5784 PT Time Calculation (min) (ACUTE ONLY): 55 min   Charges:   PT Evaluation $PT Eval Moderate Complexity: 1 Mod PT Treatments $Gait Training: 8-22 mins $Therapeutic Activity: 23-37 mins        Raymond Gurney, PT, DPT Acute Rehabilitation Services  Office: [phone]   Jewel Baize 02/23/2023, 3:54 PM

## 2023-02-23 NOTE — Progress Notes (Signed)
[facility]                                                                              Mandip Frame, is a 82 y.o. male, DOB - 1940/09/12, ZOX:[id number] Admit date - 02/22/2023    Outpatient Primary MD for the patient is Plotnikov, Georgina Quint, MD  LOS - 1  days  Chief Complaint  Patient presents with   Altered Mental Status       Brief summary   Patient is a 82 year old male with COPD, HTN, prior history of CVA in 2015, BPH presented with confusion.  He was noted to be more unsteady by his family, at baseline does have gait instability due to arthritis.  Per family, has been gradually getting worse.  On the morning of admission woke up got to the bathroom and could not figure out how to urinate.  He reported to the family that he could not see, brought to the ED.  CT head showed small right frontal SAH.   MRI brain showed small area of acute ischemia in the posterior left parietal lobe Neurology was consulted.   Assessment & Plan    Principal Problem: SAH/ICH (intracerebral hemorrhage) (HCC) -Patient had presented with altered mental status and worsening gait instability CT head showed small right frontal SAH -Neuro surgery was consulted, recommended stroke alert, CT head.  No neurosurgical interventions indicated at this time. -Repeat CT head this morning showed no hemorrhage or progression at the patient's acute left parietal cortex infarct, numerous chronic infarcts.  Active Problems: Acute CVA -MRI brain showed small area of acute ischemia in the posterior left frontal lobe -CTA head and neck showed no emergent large vessel occlusion, moderate stenosis of the cavernous segments of both internal carotid arteries.  Bilateral carotid bifurcation atherosclerosis without hemodynamically significant stenosis. -Follow PT OT, ST eval -Neurology following, started on aspirin 81 mg -Lipid panel showed LDL 112, cholesterol 183, triglycerides 61, HDL 59, started  Crestor 20 mg daily -Hemoglobin A1c 5.8 -Follow 2D echo, venous Dopplers lower extremities  Acute encephalopathy -Likely due to #1, #2 -Improving today, patient much more alert and oriented, close to his baseline mental status, confirmed by patient's daughter and son at the bedside. -  PT OT pending to assess the gait.    Essential hypertension -BP stable    COPD -No acute wheezing    BPH associated with nocturia -Continue finasteride    Hyperlipemia -Lipid panel showed LDL 112, triglycerides 61, cholesterol 183, started Crestor 20 mg daily  Leukocytosis -Unclear etiology, currently no fevers, UA negative for UTI.  Chest x-ray showed atelectasis versus infiltrate at the lung bases, stable or slightly improved  Estimated body mass index is 22.36 kg/m as calculated from the following:   Height as of this encounter: 5\' 8"  (1.727 m).   Weight as of this encounter: 66.7 kg.  Code Status:  DVT Prophylaxis:  SCD's Start: 02/22/23 2348   Level of Care: Level of care: Progressive Family Communication: Updated patient's son and daughter at the bedside Disposition Plan:      Remains inpatient appropriate:   Workup in progress.   Procedures:  Consultants:   Neurosurgery Neurology  Antimicrobials: None  Medications   stroke: early stages of recovery book   Does not apply Once   aspirin EC  81 mg Oral Daily   finasteride  5 mg Oral Daily   rosuvastatin  20 mg Oral Daily   sodium chloride flush  3 mL Intravenous Once      Subjective:   Eric [name] was seen and examined today.  Much more alert and oriented today, son and daughter at the bedside.  Oriented to self, place, person, not to time.  Patient denies dizziness, chest pain, shortness of breath, abdominal pain, N/V.  Has not ambulated yet.  No headache. Objective:   Vitals:   02/22/23 2245 02/22/23 2341 02/23/23 0300 02/23/23 0902  BP: (!) 140/72 (!) 159/93 129/64 (!) 152/75  Pulse:  79 77 73  Resp:  16 17  19   Temp:  98.1 F (36.7 C) 97.6 F (36.4 C) 98.6 F (37 C)  TempSrc:  Oral Axillary Oral  SpO2:  98% 99% 100%  Weight:      Height:        Intake/Output Summary (Last 24 hours) at 02/23/2023 1054 Last data filed at 02/23/2023 0900 Gross per 24 hour  Intake 649.68 ml  Output 1300 ml  Net -650.32 ml     Wt Readings from Last 3 Encounters:  02/22/23 66.7 kg  12/26/22 66.7 kg  06/26/22 63 kg     Exam General: Alert and oriented x place person and self.  NAD Cardiovascular: S1 S2 auscultated,  RRR Respiratory: Clear to auscultation bilaterally, no wheezing Gastrointestinal: Soft, nontender, nondistended, + bowel sounds Ext: no pedal edema bilaterally Neuro: Strength 5/5 upper and lower extremities bilaterally Psych: Normal affect     Data Reviewed:  I have personally reviewed following labs    CBC Lab Results  Component Value Date   WBC 12.3 (H) 02/23/2023   RBC 5.06 02/23/2023   HGB 11.7 (L) 02/23/2023   HCT 36.3 (L) 02/23/2023   MCV 71.7 (L) 02/23/2023   MCH 23.1 (L) 02/23/2023   PLT 363 02/23/2023   MCHC 32.2 02/23/2023   RDW 24.4 (H) 02/23/2023   LYMPHSABS 1.6 02/22/2023   MONOABS 0.6 02/22/2023   EOSABS 0.0 02/22/2023   BASOSABS 0.0 02/22/2023     Last metabolic panel Lab Results  Component Value Date   NA 137 02/23/2023   K 3.8 02/23/2023   CL 105 02/23/2023   CO2 21 (L) 02/23/2023   BUN 6 (L) 02/23/2023   CREATININE 0.85 02/23/2023   GLUCOSE 106 (H) 02/23/2023   GFRNONAA >60 02/23/2023   GFRAA >60 11/08/2015   CALCIUM 9.2 02/23/2023   PHOS 3.0 02/23/2023   PROT 7.0 02/23/2023   ALBUMIN 3.6 02/23/2023   BILITOT 1.0 02/23/2023   ALKPHOS 52 02/23/2023   AST 16 02/23/2023   ALT 15 02/23/2023   ANIONGAP 11 02/23/2023    CBG (last 3)  Recent Labs    02/22/23 1234  GLUCAP 114*      Coagulation Profile: Recent Labs  Lab 02/22/23 1236  INR 1.1     Radiology Studies: I have personally reviewed the imaging studies  DG Chest 2  View  Result Date: 02/23/2023 CLINICAL DATA:  Altered mental status. EXAM: CHEST - 2 VIEW COMPARISON:  02/22/2023 FINDINGS: Low volume chest with mild hazy density at the bases, slightly improved. No edema, effusion, or pneumothorax. Normal heart size and mediastinal contours. IMPRESSION: Atelectasis versus infiltrate at the lung bases,  stable or slightly improved. Electronically Signed   By: Tiburcio Pea M.D.   On: 02/23/2023 07:57   CT ANGIO HEAD NECK W WO CM  Result Date: 02/22/2023 CLINICAL DATA:  Stroke/TIA EXAM: CT ANGIOGRAPHY HEAD AND NECK WITH AND WITHOUT CONTRAST TECHNIQUE: Multidetector CT imaging of the head and neck was performed using the standard protocol during bolus administration of intravenous contrast. Multiplanar CT image reconstructions and MIPs were obtained to evaluate the vascular anatomy. Carotid stenosis measurements (when applicable) are obtained utilizing NASCET criteria, using the distal internal carotid diameter as the denominator. RADIATION DOSE REDUCTION: This exam was performed according to the departmental dose-optimization program which includes automated exposure control, adjustment of the mA and/or kV according to patient size and/or use of iterative reconstruction technique. CONTRAST:  75mL OMNIPAQUE IOHEXOL 350 MG/ML SOLN COMPARISON:  None Available. FINDINGS: CTA NECK FINDINGS SKELETON: There is no bony spinal canal stenosis. No lytic or blastic lesion. OTHER NECK: Normal pharynx, larynx and major salivary glands. No cervical lymphadenopathy. Unremarkable thyroid gland. UPPER CHEST: No pneumothorax or pleural effusion. No nodules or masses. AORTIC ARCH: There is no calcific atherosclerosis of the aortic arch. Conventional 3 vessel aortic branching pattern. RIGHT CAROTID SYSTEM: No dissection, occlusion or aneurysm. Mild atherosclerotic calcification at the carotid bifurcation without hemodynamically significant stenosis. The distal right ICA is diffusely mildly  narrowed. LEFT CAROTID SYSTEM: No dissection, occlusion or aneurysm. There is mixed density atherosclerosis extending into the proximal ICA, resulting in less than 50% stenosis. VERTEBRAL ARTERIES: Left dominant configuration.There is no dissection, occlusion or flow-limiting stenosis to the skull base (V1-V3 segments). CTA HEAD FINDINGS POSTERIOR CIRCULATION: --Vertebral arteries: Normal V4 segments. --Inferior cerebellar arteries: Normal. --Basilar artery: Normal. --Superior cerebellar arteries: Normal. --Posterior cerebral arteries (PCA): Normal. ANTERIOR CIRCULATION: --Intracranial internal carotid arteries: Bilateral atherosclerotic calcification with moderate stenosis of the cavernous segments. --Anterior cerebral arteries (ACA): Normal. Both A1 segments are present. Patent anterior communicating artery (a-comm). --Middle cerebral arteries (MCA): Normal. VENOUS SINUSES: As permitted by contrast timing, patent. ANATOMIC VARIANTS: None Review of the MIP images confirms the above findings. IMPRESSION: 1. No emergent large vessel occlusion. 2. 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 3. Bilateral carotid bifurcation atherosclerosis without hemodynamically significant stenosis. Electronically Signed   By: Deatra Robinson M.D.   On: 02/22/2023 20:50   MR BRAIN WO CONTRAST  Result Date: 02/22/2023 CLINICAL DATA:  Acute neurologic deficit EXAM: MRI HEAD WITHOUT CONTRAST TECHNIQUE: Multiplanar, multiecho pulse sequences of the brain and surrounding structures were obtained without intravenous contrast. COMPARISON:  11/09/2015 FINDINGS: Truncated examination due to patient altered mental status. Only axial and coronal diffusion-weighted imaging was obtained. There is a small area of acute ischemia within the posterior left parietal lobe. There is an old right temporal infarct. Mild generalized volume loss. IMPRESSION: Small area of acute ischemia within the posterior left parietal  lobe. Electronically Signed   By: Deatra Robinson M.D.   On: 02/22/2023 19:01   DG Chest Port 1 View  Result Date: 02/22/2023 CLINICAL DATA:  Altered mental status EXAM: PORTABLE CHEST 1 VIEW COMPARISON:  Chest radiograph dated 11/08/2015 FINDINGS: Normal lung volumes. Patchy left retrocardiac opacity. No pleural effusion or pneumothorax. The heart size and mediastinal contours are within normal limits. No acute osseous abnormality. Unchanged punctate radiodensity projects over the right neck. IMPRESSION: Patchy left retrocardiac opacity, which may represent atelectasis, aspiration, or pneumonia. Electronically Signed   By: Agustin Cree M.D.   On: 02/22/2023 15:28   CT HEAD  WO CONTRAST  Addendum Date: 02/22/2023   ADDENDUM REPORT: 02/22/2023 13:28 ADDENDUM: Critical Value/emergent results were called by telephone at the time of interpretation on 02/22/2023 at 1:27 pm to provider Dr. Durwin Nora, who verbally acknowledged these results. Electronically Signed   By: Kennith Center M.D.   On: 02/22/2023 13:28   Result Date: 02/22/2023 CLINICAL DATA:  Memory loss EXAM: CT HEAD WITHOUT CONTRAST TECHNIQUE: Contiguous axial images were obtained from the base of the skull through the vertex without intravenous contrast. RADIATION DOSE REDUCTION: This exam was performed according to the departmental dose-optimization program which includes automated exposure control, adjustment of the mA and/or kV according to patient size and/or use of iterative reconstruction technique. COMPARISON:  07/23/2013.  Brain MRI 11/09/2015 FINDINGS: Brain: There is a small focus of subarachnoid hemorrhage in the anterior right frontal lobe (axial 13/2 and well seen coronal 14/4. Right temporoparietal encephalomalacia corresponding to the area of hemorrhage seen on remote CT from 2014. High right frontal encephalomalacia appears progressive since 2014. High left parietal nonacute infarct/encephalomalacia. Encephalomalacia in the left occipital lobe is  compatible with remote infarct. Lacunar infarcts are noted in the left cerebellum. Lacunar infarcts are seen in the basal ganglia bilaterally diffuse loss of parenchymal volume is consistent with atrophy. Patchy low attenuation in the deep hemispheric and periventricular white matter is nonspecific, but likely reflects chronic microvascular ischemic demyelination. Vascular: No hyperdense vessel or unexpected calcification. Skull: No evidence for fracture. No worrisome lytic or sclerotic lesion. Sinuses/Orbits: The visualized paranasal sinuses and mastoid air cells are clear. Visualized portions of the globes and intraorbital fat are unremarkable. Other: None. IMPRESSION: 1. Small focus of subarachnoid hemorrhage in the anterior right frontal lobe. 2. Multifocal areas of encephalomalacia/chronic infarct involving both cerebral hemispheres and the left cerebellum. 3. Atrophy with chronic small vessel ischemic disease. Electronically Signed: By: Kennith Center M.D. On: 02/22/2023 13:19       Kalan Rinn M.D. [facility] 02/23/2023, 10:54 AM  Available via Epic secure chat 7am-7pm After 7 pm, please refer to night coverage provider listed on amion.

## 2023-02-23 NOTE — Assessment & Plan Note (Signed)
Hold off on aspirin Neurochecks every 4 hours Keep BP below 160 Labetalol as needed Neurology aware Neurosurgery aware. Repeat CT head at 6 AM

## 2023-02-23 NOTE — Progress Notes (Signed)
STROKE TEAM PROGRESS NOTE   SUBJECTIVE (INTERVAL HISTORY) His daughter and niece are at the bedside.  Overall his condition is unchanged. Per family, he had general decline since COVID vaccine about 4 years ago, need one person assist at home for ambulation and daily ADLs. 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    OBJECTIVE Temp:  [97.6 F (36.4 C)-98.6 F (37 C)] 98.5 F (36.9 C) (06/23 1100) Pulse Rate:  [66-95] 73 (06/23 0902) Cardiac Rhythm: Normal sinus rhythm (06/23 0905) Resp:  [14-23] 19 (06/23 0902) BP: (129-191)/(64-99) 155/80 (06/23 1100) SpO2:  [98 %-100 %] 100 % (06/23 1100)  Recent Labs  Lab 02/22/23 1234  GLUCAP 114*   Recent Labs  Lab 02/22/23 1236 02/22/23 1248 02/22/23 1615 02/22/23 2211 02/23/23 0013  NA 137 138  --  137 137  K 4.0 4.2  --  4.1 3.8  CL 101 104  --   --  105  CO2 24  --   --   --  21*  GLUCOSE 113* 112*  --   --  106*  BUN 7* 7*  --   --  6*  CREATININE 0.93 0.90  --   --  0.85  CALCIUM 9.4  --   --   --  9.2  MG  --   --  1.8  --  1.7  PHOS  --   --  3.0  --  3.0   Recent Labs  Lab 02/22/23 1236 02/23/23 0013  AST 16 16  ALT 16 15  ALKPHOS 52 52  BILITOT 0.5 1.0  PROT 7.0 7.0  ALBUMIN 3.6 3.6   Recent Labs  Lab 02/22/23 1236 02/22/23 1248 02/22/23 2211 02/23/23 0013  WBC 10.0  --   --  12.3*  NEUTROABS 7.8*  --   --   --   HGB 12.0* 13.6 12.9* 11.7*  HCT 37.7* 40.0 38.0* 36.3*  MCV 71.9*  --   --  71.7*  PLT 404*  --   --  363   Recent Labs  Lab 02/22/23 1615  CKTOTAL 78   Recent Labs    02/22/23 1236  LABPROT 14.6  INR 1.1   Recent Labs    02/23/23 0020  COLORURINE STRAW*  LABSPEC 1.020  PHURINE 7.0  GLUCOSEU NEGATIVE  HGBUR NEGATIVE  BILIRUBINUR NEGATIVE  KETONESUR NEGATIVE  PROTEINUR NEGATIVE  NITRITE NEGATIVE  LEUKOCYTESUR NEGATIVE       Component Value Date/Time   CHOL 183 02/23/2023 0013   TRIG 61 02/23/2023 0013   HDL 59 02/23/2023 0013    CHOLHDL 3.1 02/23/2023 0013   VLDL 12 02/23/2023 0013   LDLCALC 112 (H) 02/23/2023 0013   Lab Results  Component Value Date   HGBA1C 5.8 (H) 02/23/2023   No results found for: "LABOPIA", "COCAINSCRNUR", "LABBENZ", "AMPHETMU", "THCU", "LABBARB"  Recent Labs  Lab 02/22/23 1236  ETH <10    I have personally reviewed the radiological images below and agree with the radiology interpretations.  VAS [place] LOWER EXTREMITY VENOUS (DVT)  Result Date: 02/23/2023  Lower Venous DVT Study Patient Name:  ARYO ASMAN [facility]  Date of Exam:   02/23/2023 Medical Rec #: [id number]          Accession #:    [id number] Date of Birth: 25-Sep-1940          Patient Gender: M Patient Age:   82 years Exam Location:  [facility] Procedure:  VAS [place] LOWER EXTREMITY VENOUS (DVT) Referring Phys: Scheryl Marten Daishawn Lauf --------------------------------------------------------------------------------  Indications: Stroke.  Limitations: Patient immobility. Comparison Study: No previous exams Performing Technologist: Jody Hill RVT, RDMS  Examination Guidelines: A complete evaluation includes B-mode imaging, spectral Doppler, color Doppler, and power Doppler as needed of all accessible portions of each vessel. Bilateral testing is considered an integral part of a complete examination. Limited examinations for reoccurring indications may be performed as noted. The reflux portion of the exam is performed with the patient in reverse Trendelenburg.  +---------+---------------+---------+-----------+----------+--------------+ RIGHT    CompressibilityPhasicitySpontaneityPropertiesThrombus Aging +---------+---------------+---------+-----------+----------+--------------+ CFV      Full           Yes      Yes                                 +---------+---------------+---------+-----------+----------+--------------+ SFJ      Full                                                         +---------+---------------+---------+-----------+----------+--------------+ FV Prox  Full           Yes      Yes                                 +---------+---------------+---------+-----------+----------+--------------+ FV Mid   Full           Yes      Yes                                 +---------+---------------+---------+-----------+----------+--------------+ FV DistalFull           Yes      Yes                                 +---------+---------------+---------+-----------+----------+--------------+ PFV      Full                                                        +---------+---------------+---------+-----------+----------+--------------+ POP      Full           Yes      Yes                                 +---------+---------------+---------+-----------+----------+--------------+ PTV      Full                                                        +---------+---------------+---------+-----------+----------+--------------+ PERO     Full                                                        +---------+---------------+---------+-----------+----------+--------------+   +---------+---------------+---------+-----------+----------+--------------+  LEFT     CompressibilityPhasicitySpontaneityPropertiesThrombus Aging +---------+---------------+---------+-----------+----------+--------------+ CFV      Full           Yes      Yes                                 +---------+---------------+---------+-----------+----------+--------------+ SFJ      Full                                                        +---------+---------------+---------+-----------+----------+--------------+ FV Prox  Full           Yes      Yes                                 +---------+---------------+---------+-----------+----------+--------------+ FV Mid   Full           Yes      Yes                                  +---------+---------------+---------+-----------+----------+--------------+ FV DistalFull           Yes      Yes                                 +---------+---------------+---------+-----------+----------+--------------+ PFV      Full                                                        +---------+---------------+---------+-----------+----------+--------------+ POP      Full           Yes      Yes                                 +---------+---------------+---------+-----------+----------+--------------+ PTV      Full                                                        +---------+---------------+---------+-----------+----------+--------------+ PERO     Full                                                        +---------+---------------+---------+-----------+----------+--------------+    Summary: BILATERAL: - No evidence of deep vein thrombosis seen in the lower extremities, bilaterally. -No evidence of popliteal cyst, bilaterally.   *See table(s) above for measurements and observations.    Preliminary    CT HEAD WO CONTRAST ( )  Result Date: 02/23/2023 CLINICAL DATA:  Acute neurologic deficit. EXAM: CT HEAD WITHOUT CONTRAST TECHNIQUE: Contiguous axial images were  obtained from the base of the skull through the vertex without intravenous contrast. RADIATION DOSE REDUCTION: This exam was performed according to the departmental dose-optimization program which includes automated exposure control, adjustment of the mA and/or kV according to patient size and/or use of iterative reconstruction technique. COMPARISON:  Brain MRI from yesterday FINDINGS: Brain: Known moderate acute infarct in the left parietal cortex. Numerous bilateral cerebral cortex infarcts, more extensive on the right. Moderate chronic left cerebellar infarct. Chronic perforator infarcts at the deep gray nuclei. No hemorrhage, hydrocephalus, or mass. Vascular: No hyperdense vessel or unexpected  calcification. Skull: Normal. Negative for fracture or focal lesion. Sinuses/Orbits: No acute finding. IMPRESSION: 1. No hemorrhage or detected progression at the patient's acute left parietal cortex infarct. 2. Numerous chronic infarcts. Electronically Signed   By: Tiburcio Pea M.D.   On: 02/23/2023 10:52   DG Chest 2 View  Result Date: 02/23/2023 CLINICAL DATA:  Altered mental status. EXAM: CHEST - 2 VIEW COMPARISON:  02/22/2023 FINDINGS: Low volume chest with mild hazy density at the bases, slightly improved. No edema, effusion, or pneumothorax. Normal heart size and mediastinal contours. IMPRESSION: Atelectasis versus infiltrate at the lung bases, stable or slightly improved. Electronically Signed   By: Tiburcio Pea M.D.   On: 02/23/2023 07:57   CT ANGIO HEAD NECK W WO CM  Result Date: 02/22/2023 CLINICAL DATA:  Stroke/TIA EXAM: CT ANGIOGRAPHY HEAD AND NECK WITH AND WITHOUT CONTRAST TECHNIQUE: Multidetector CT imaging of the head and neck was performed using the standard protocol during bolus administration of intravenous contrast. Multiplanar CT image reconstructions and MIPs were obtained to evaluate the vascular anatomy. Carotid stenosis measurements (when applicable) are obtained utilizing NASCET criteria, using the distal internal carotid diameter as the denominator. RADIATION DOSE REDUCTION: This exam was performed according to the departmental dose-optimization program which includes automated exposure control, adjustment of the mA and/or kV according to patient size and/or use of iterative reconstruction technique. CONTRAST:  75mL OMNIPAQUE IOHEXOL 350 MG/ML SOLN COMPARISON:  None Available. FINDINGS: CTA NECK FINDINGS SKELETON: There is no bony spinal canal stenosis. No lytic or blastic lesion. OTHER NECK: Normal pharynx, larynx and major salivary glands. No cervical lymphadenopathy. Unremarkable thyroid gland. UPPER CHEST: No pneumothorax or pleural effusion. No nodules or masses. AORTIC  ARCH: There is no calcific atherosclerosis of the aortic arch. Conventional 3 vessel aortic branching pattern. RIGHT CAROTID SYSTEM: No dissection, occlusion or aneurysm. Mild atherosclerotic calcification at the carotid bifurcation without hemodynamically significant stenosis. The distal right ICA is diffusely mildly narrowed. LEFT CAROTID SYSTEM: No dissection, occlusion or aneurysm. There is mixed density atherosclerosis extending into the proximal ICA, resulting in less than 50% stenosis. VERTEBRAL ARTERIES: Left dominant configuration.There is no dissection, occlusion or flow-limiting stenosis to the skull base (V1-V3 segments). CTA HEAD FINDINGS POSTERIOR CIRCULATION: --Vertebral arteries: Normal V4 segments. --Inferior cerebellar arteries: Normal. --Basilar artery: Normal. --Superior cerebellar arteries: Normal. --Posterior cerebral arteries (PCA): Normal. ANTERIOR CIRCULATION: --Intracranial internal carotid arteries: Bilateral atherosclerotic calcification with moderate stenosis of the cavernous segments. --Anterior cerebral arteries (ACA): Normal. Both A1 segments are present. Patent anterior communicating artery (a-comm). --Middle cerebral arteries (MCA): Normal. VENOUS SINUSES: As permitted by contrast timing, patent. ANATOMIC VARIANTS: None Review of the MIP images confirms the above findings. IMPRESSION: 1. No emergent large vessel occlusion. 2. 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 3. Bilateral carotid bifurcation atherosclerosis without hemodynamically significant stenosis. Electronically Signed   By: Deatra Robinson M.D.   On: 02/22/2023 20:50  MR BRAIN WO CONTRAST  Result Date: 02/22/2023 CLINICAL DATA:  Acute neurologic deficit EXAM: MRI HEAD WITHOUT CONTRAST TECHNIQUE: Multiplanar, multiecho pulse sequences of the brain and surrounding structures were obtained without intravenous contrast. COMPARISON:  11/09/2015 FINDINGS: Truncated examination due to  patient altered mental status. Only axial and coronal diffusion-weighted imaging was obtained. There is a small area of acute ischemia within the posterior left parietal lobe. There is an old right temporal infarct. Mild generalized volume loss. IMPRESSION: Small area of acute ischemia within the posterior left parietal lobe. Electronically Signed   By: Deatra Robinson M.D.   On: 02/22/2023 19:01   DG Chest Port 1 View  Result Date: 02/22/2023 CLINICAL DATA:  Altered mental status EXAM: PORTABLE CHEST 1 VIEW COMPARISON:  Chest radiograph dated 11/08/2015 FINDINGS: Normal lung volumes. Patchy left retrocardiac opacity. No pleural effusion or pneumothorax. The heart size and mediastinal contours are within normal limits. No acute osseous abnormality. Unchanged punctate radiodensity projects over the right neck. IMPRESSION: Patchy left retrocardiac opacity, which may represent atelectasis, aspiration, or pneumonia. Electronically Signed   By: Agustin Cree M.D.   On: 02/22/2023 15:28   CT HEAD WO CONTRAST  Addendum Date: 02/22/2023   ADDENDUM REPORT: 02/22/2023 13:28 ADDENDUM: Critical Value/emergent results were called by telephone at the time of interpretation on 02/22/2023 at 1:27 pm to provider Dr. Durwin Nora, who verbally acknowledged these results. Electronically Signed   By: Kennith Center M.D.   On: 02/22/2023 13:28   Result Date: 02/22/2023 CLINICAL DATA:  Memory loss EXAM: CT HEAD WITHOUT CONTRAST TECHNIQUE: Contiguous axial images were obtained from the base of the skull through the vertex without intravenous contrast. RADIATION DOSE REDUCTION: This exam was performed according to the departmental dose-optimization program which includes automated exposure control, adjustment of the mA and/or kV according to patient size and/or use of iterative reconstruction technique. COMPARISON:  07/23/2013.  Brain MRI 11/09/2015 FINDINGS: Brain: There is a small focus of subarachnoid hemorrhage in the anterior right frontal  lobe (axial 13/2 and well seen coronal 14/4. Right temporoparietal encephalomalacia corresponding to the area of hemorrhage seen on remote CT from 2014. High right frontal encephalomalacia appears progressive since 2014. High left parietal nonacute infarct/encephalomalacia. Encephalomalacia in the left occipital lobe is compatible with remote infarct. Lacunar infarcts are noted in the left cerebellum. Lacunar infarcts are seen in the basal ganglia bilaterally diffuse loss of parenchymal volume is consistent with atrophy. Patchy low attenuation in the deep hemispheric and periventricular white matter is nonspecific, but likely reflects chronic microvascular ischemic demyelination. Vascular: No hyperdense vessel or unexpected calcification. Skull: No evidence for fracture. No worrisome lytic or sclerotic lesion. Sinuses/Orbits: The visualized paranasal sinuses and mastoid air cells are clear. Visualized portions of the globes and intraorbital fat are unremarkable. Other: None. IMPRESSION: 1. Small focus of subarachnoid hemorrhage in the anterior right frontal lobe. 2. Multifocal areas of encephalomalacia/chronic infarct involving both cerebral hemispheres and the left cerebellum. 3. Atrophy with chronic small vessel ischemic disease. Electronically Signed: By: Kennith Center M.D. On: 02/22/2023 13:19     PHYSICAL EXAM  Temp:  [97.6 F (36.4 C)-98.6 F (37 C)] 98.5 F (36.9 C) (06/23 1100) Pulse Rate:  [66-95] 73 (06/23 0902) Resp:  [14-23] 19 (06/23 0902) BP: (129-191)/(64-99) 155/80 (06/23 1100) SpO2:  [98 %-100 %] 100 % (06/23 1100)  General - Well nourished, well developed, in no apparent distress.  Ophthalmologic - fundi not visualized due to noncooperation.  Cardiovascular - Regular rhythm and rate.  Neuro -  awake, alert, eyes open, orientated to place only, not orientated to time or age. No aphasia, paucity of speech, following all simple commands, moderate dysarthria. Able to name 4/5 and  repeat simple sentences. Left eye abduction with difficulty cross midline. Right eye intact b/l gaze, tracking bilaterally, left hemianopia. Mild left nasolabial fold flattening with poor denture. Tongue midline. LUE 4/5 bicep and tricep and finger grip, RUE 5/5. Bilaterally LEs 5/5, no drift. Sensation symmetrical bilaterally subjectively, b/l FTN intact grossly, gait not tested.    ASSESSMENT/PLAN Mr. MIKKEL BURKER is a 82 y.o. male with history of hypertension, COPD, BPH, ICH admitted for confusion and vision change. No tPA given due to history of ICH.    Stroke:  left parietal occipital small infarct, embolic pattern etiology unclear, concerning for cardioembolic source versus large vessel disease CT 6/22 small right frontal SAH Repeat CT 6/23 no hemorrhage MRI limited study, left parietal occipital small infarct CTA head and neck bilateral carotid siphon and bulb atherosclerosis 2D Echo EF 45 to 50% LE venous Doppler no DVT Recommend 30-day CardioNet monitor as outpatient to rule out A-fib LDL 112 HgbA1c 5.8 Ammonia level within normal limits UA negative SCDs for VTE prophylaxis No antithrombotic prior to admission, now on aspirin 81 mg daily and clopidogrel 75 mg daily DAPT for 3 weeks and then as alone. 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  CT showed right temporoparietal ICH with midline shift, chronic bilateral BG infarcts.  LDL 104.  Residual symptoms including left hemianopia. Per family, patient has gradual decline since COVID vaccination 4 years ago.  Hypertension Stable on high end Resume home meds including amlodipine 5 and benazepril 20 Long term BP goal normotensive  Hyperlipidemia Home meds: None LDL 112, goal < 70 Now on Crestor 20 Continue  statin at discharge  Other Stroke Risk Factors Advanced age  Other Active Problems Leukocytosis WBC 12.3 COPD BPH on Proscar  Hospital day # 1  Neurology will sign off. Please call with questions. Pt will follow up with stroke clinic NP Jessica at [facility] in about 4 weeks. Thanks for the consult.   Marvel Plan, MD PhD Stroke Neurology 02/23/2023 2:09 PM    To contact Stroke Continuity provider, please refer to [email or web address]. After hours, contact General Neurology

## 2023-02-23 NOTE — Assessment & Plan Note (Addendum)
Repeat chest x-ray in a.m.  Family denies any recent history of fevers cough or shortness of breath.  No evidence of infectious process otherwise.  Procalcitonin unremarkable Hold off on IV antibiotics for tonight

## 2023-02-24 MED ORDER — ROSUVASTATIN CALCIUM 20 MG PO TABS: 20.0000 mg | ORAL_TABLET | Freq: Every day | ORAL | 4 refills | Status: DC

## 2023-02-24 MED ORDER — ASPIRIN 81 MG PO TBEC: 81.0000 mg | DELAYED_RELEASE_TABLET | Freq: Every day | ORAL | 12 refills | Status: DC

## 2023-02-24 MED ORDER — CLOPIDOGREL BISULFATE 75 MG PO TABS: 75.0000 mg | ORAL_TABLET | Freq: Every day | ORAL | 0 refills | Status: AC

## 2023-02-24 MED ORDER — PANTOPRAZOLE SODIUM 40 MG PO TBEC: 40.0000 mg | DELAYED_RELEASE_TABLET | Freq: Every day | ORAL | 3 refills | Status: DC

## 2023-02-24 NOTE — Progress Notes (Signed)
Inpatient Rehab Admissions Coordinator:    I met with Pt and son in room and spoke with daughter over the phone. They prefer to go home rather than CIR and son feels he can manage Pt. And current assist levels. CIR will sign off.   Megan Salon, MS, CCC-SLP Rehab Admissions Coordinator  [phone] (celll) [phone] (office)

## 2023-02-24 NOTE — Plan of Care (Signed)
Pt echo this admission reported showed Evidence of atrial level shunting detected by color flow Doppler. 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 There appears to be a small PFO by color doppler with left to right flow."  No matter it is small PFO or ASD, pt 82 yo with multiple stroke risk factors, and his ROPE score is 3 which means there is likely 0% chance that his current stroke is related to PFO or ASD. No more work up or intervention needed. Discussed with Dr. Isidoro Donning.  Marvel Plan, MD PhD Stroke Neurology 02/24/2023 12:31 PM

## 2023-02-24 NOTE — Discharge Summary (Signed)
Physician Discharge Summary   Patient: Eric [name] MRN: [id number] DOB: [birth year]  Admit date:     02/22/2023  Discharge date: 02/24/23  Discharge Physician: Thad Ranger, MD    PCP: Tresa Garter, MD   Recommendations at discharge:   Per neurology, continue aspirin 81 mg daily, Plavix 75 mg daily, 43 weeks and then aspirin alone Continue Crestor 20 mg daily Cardiology consulted for 30-day event monitor as outpatient to rule out A-fib, will arrange  Discharge Diagnoses:      Acute left parietal occipital small infarct   ICH (intracerebral hemorrhage) (HCC) Active Problems:   Essential hypertension   COPD   BPH associated with nocturia   Hyperlipemia    Hospital Course: Patient is a 82 year old male with COPD, HTN, prior history of CVA in 2015, BPH presented with confusion.  He was noted to be more unsteady by his family, at baseline does have gait instability due to arthritis.  Per family, has been gradually getting worse.  On the morning of admission woke up got to the bathroom and could not figure out how to urinate.  He reported to the family that he could not see, brought to the ED.  CT head showed small right frontal SAH.   MRI brain showed small area of acute ischemia in the posterior left parietal lobe Neurology was consulted.  Assessment and Plan:  SAH/ICH (intracerebral hemorrhage) (HCC) -Patient had presented with altered mental status and worsening gait instability. CT head showed small right frontal SAH -Neuro surgery was consulted, recommended stroke work-up, CT head.  No neurosurgical interventions indicated at this time. -Repeat CT head this morning showed no hemorrhage or progression at the patient's acute left parietal cortex infarct, numerous chronic infarcts.    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  Bilateral carotid bifurcation atherosclerosis without hemodynamically significant stenosis. 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  The small PFO was also seen on 2D echo in 2014.  Discussed in detail with neurology, Dr. Roda Shutters, venous Dopplers were negative for any DVT.  Given patient's age and other comorbidities, recommended no further workup of ASD or shunting. -Cardiology consulted for 30-day event monitor, office will arrange -PT evaluation recommended CIR however patient's family requested home health PT, OT    Acute encephalopathy -Likely due to #1, #2 -Improving, close to baseline mental status, confounded by daughter at the bedside  -  PT OT recommended CIR however patient's family requested home health PT OT     Essential hypertension -BP stable     COPD -No acute wheezing     BPH associated with nocturia -Continue finasteride     Hyperlipemia -Lipid panel showed LDL 112, triglycerides 61, cholesterol 183, started Crestor 20 mg daily   Leukocytosis -Unclear etiology, currently no fevers, UA negative for UTI.  Chest x-ray showed atelectasis versus infiltrate at the lung bases, stable or slightly improved   Estimated body mass index is 22.36 kg/m as calculated from the following:  POP      Full           Yes      Yes                                  +---------+---------------+---------+-----------+----------+--------------+ PTV      Full                                                        +---------+---------------+---------+-----------+----------+--------------+ PERO     Full                                                        +---------+---------------+---------+-----------+----------+--------------+     Summary: BILATERAL: - No evidence of deep vein thrombosis seen in the lower extremities, bilaterally. -No evidence of popliteal cyst, bilaterally.   *See table(s) above for measurements and observations. Electronically signed by Waverly Ferrari MD on 02/23/2023 at 3:57:45 PM.    Final    ECHOCARDIOGRAM COMPLETE  Result Date: 02/23/2023    ECHOCARDIOGRAM REPORT   Patient Name:   Eric [name] [facility] Date of Exam: 02/23/2023 Medical Rec #:  [id number]         Height:       68.0 in Accession #:    [id number]        Weight:       147.0 lb Date of Birth:  Aug 12, 1941         BSA:          1.793 m Patient Age:    82 years          BP:           152/75 mmHg Patient Gender: M                 HR:           72 bpm. Exam Location:  Inpatient Procedure: 2D Echo, Color Doppler and Cardiac Doppler Indications:    Stroke i63.9  History:        Patient has prior history of Echocardiogram examinations, most                 recent 07/03/2013. COPD; Risk Factors:Hypertension and                 Dyslipidemia.  Sonographer:    Irving Burton Senior RDCS Referring Phys: [id number] ANASTASSIA DOUTOVA  Sonographer Comments: Technically difficult due to thin body habitus and COPD. IMPRESSIONS  1. Left ventricular ejection fraction, by estimation, is 45 to 50%. Left ventricular ejection fraction by 2D MOD biplane is 47.1 %. The left ventricle has mildly decreased function. The left ventricle demonstrates global hypokinesis. Left ventricular diastolic parameters are consistent with Grade I diastolic dysfunction (impaired relaxation). There is the interventricular septum is  flattened in diastole ('D' shaped left ventricle), consistent with right ventricular volume overload.  2. Right ventricular systolic function is mildly reduced. The right ventricular size is moderately enlarged. There is normal pulmonary artery systolic pressure. The estimated right ventricular systolic pressure is 33.4 mmHg.  3. The mitral valve is normal in structure. Trivial  POP      Full           Yes      Yes                                  +---------+---------------+---------+-----------+----------+--------------+ PTV      Full                                                        +---------+---------------+---------+-----------+----------+--------------+ PERO     Full                                                        +---------+---------------+---------+-----------+----------+--------------+     Summary: BILATERAL: - No evidence of deep vein thrombosis seen in the lower extremities, bilaterally. -No evidence of popliteal cyst, bilaterally.   *See table(s) above for measurements and observations. Electronically signed by Waverly Ferrari MD on 02/23/2023 at 3:57:45 PM.    Final    ECHOCARDIOGRAM COMPLETE  Result Date: 02/23/2023    ECHOCARDIOGRAM REPORT   Patient Name:   Eric [name] [facility] Date of Exam: 02/23/2023 Medical Rec #:  [id number]         Height:       68.0 in Accession #:    [id number]        Weight:       147.0 lb Date of Birth:  Aug 12, 1941         BSA:          1.793 m Patient Age:    82 years          BP:           152/75 mmHg Patient Gender: M                 HR:           72 bpm. Exam Location:  Inpatient Procedure: 2D Echo, Color Doppler and Cardiac Doppler Indications:    Stroke i63.9  History:        Patient has prior history of Echocardiogram examinations, most                 recent 07/03/2013. COPD; Risk Factors:Hypertension and                 Dyslipidemia.  Sonographer:    Irving Burton Senior RDCS Referring Phys: [id number] ANASTASSIA DOUTOVA  Sonographer Comments: Technically difficult due to thin body habitus and COPD. IMPRESSIONS  1. Left ventricular ejection fraction, by estimation, is 45 to 50%. Left ventricular ejection fraction by 2D MOD biplane is 47.1 %. The left ventricle has mildly decreased function. The left ventricle demonstrates global hypokinesis. Left ventricular diastolic parameters are consistent with Grade I diastolic dysfunction (impaired relaxation). There is the interventricular septum is  flattened in diastole ('D' shaped left ventricle), consistent with right ventricular volume overload.  2. Right ventricular systolic function is mildly reduced. The right ventricular size is moderately enlarged. There is normal pulmonary artery systolic pressure. The estimated right ventricular systolic pressure is 33.4 mmHg.  3. The mitral valve is normal in structure. Trivial  Physician Discharge Summary   Patient: Eric [name] MRN: [id number] DOB: [birth year]  Admit date:     02/22/2023  Discharge date: 02/24/23  Discharge Physician: Thad Ranger, MD    PCP: Tresa Garter, MD   Recommendations at discharge:   Per neurology, continue aspirin 81 mg daily, Plavix 75 mg daily, 43 weeks and then aspirin alone Continue Crestor 20 mg daily Cardiology consulted for 30-day event monitor as outpatient to rule out A-fib, will arrange  Discharge Diagnoses:      Acute left parietal occipital small infarct   ICH (intracerebral hemorrhage) (HCC) Active Problems:   Essential hypertension   COPD   BPH associated with nocturia   Hyperlipemia    Hospital Course: Patient is a 82 year old male with COPD, HTN, prior history of CVA in 2015, BPH presented with confusion.  He was noted to be more unsteady by his family, at baseline does have gait instability due to arthritis.  Per family, has been gradually getting worse.  On the morning of admission woke up got to the bathroom and could not figure out how to urinate.  He reported to the family that he could not see, brought to the ED.  CT head showed small right frontal SAH.   MRI brain showed small area of acute ischemia in the posterior left parietal lobe Neurology was consulted.  Assessment and Plan:  SAH/ICH (intracerebral hemorrhage) (HCC) -Patient had presented with altered mental status and worsening gait instability. CT head showed small right frontal SAH -Neuro surgery was consulted, recommended stroke work-up, CT head.  No neurosurgical interventions indicated at this time. -Repeat CT head this morning showed no hemorrhage or progression at the patient's acute left parietal cortex infarct, numerous chronic infarcts.    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  Bilateral carotid bifurcation atherosclerosis without hemodynamically significant stenosis. 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  The small PFO was also seen on 2D echo in 2014.  Discussed in detail with neurology, Dr. Roda Shutters, venous Dopplers were negative for any DVT.  Given patient's age and other comorbidities, recommended no further workup of ASD or shunting. -Cardiology consulted for 30-day event monitor, office will arrange -PT evaluation recommended CIR however patient's family requested home health PT, OT    Acute encephalopathy -Likely due to #1, #2 -Improving, close to baseline mental status, confounded by daughter at the bedside  -  PT OT recommended CIR however patient's family requested home health PT OT     Essential hypertension -BP stable     COPD -No acute wheezing     BPH associated with nocturia -Continue finasteride     Hyperlipemia -Lipid panel showed LDL 112, triglycerides 61, cholesterol 183, started Crestor 20 mg daily   Leukocytosis -Unclear etiology, currently no fevers, UA negative for UTI.  Chest x-ray showed atelectasis versus infiltrate at the lung bases, stable or slightly improved   Estimated body mass index is 22.36 kg/m as calculated from the following:  [address] [place] [address] [phone]               Discharge Exam: Ceasar Mons Weights   02/22/23 1229  Weight: 66.7 kg   S: No acute complaints, mental status improving, daughter at the bedside, wants to go home with home health.  Declined CIR.  BP (!) 146/73 (BP Location: Left Arm)   Pulse 69   Temp 98.5 F (36.9 C) (Oral)   Resp 18   Ht 5\' 8"  (1.727 m)   Wt 66.7 kg   SpO2 99%   BMI 22.36 kg/m   Physical Exam General: Alert and oriented, NAD, close to baseline mental status Cardiovascular: S1 S2 clear, RRR.  Respiratory: CTAB Gastrointestinal: Soft, nontender, nondistended, NBS Ext: no pedal edema bilaterally Neuro: no new deficits Psych: Normal affect    Condition at discharge: fair  The results of significant diagnostics from this hospitalization (including imaging, microbiology, ancillary and laboratory) are listed below for reference.   Imaging Studies: VAS [place] LOWER EXTREMITY VENOUS (DVT)  Result Date:  02/23/2023  Lower Venous DVT Study Patient Name:  Eric [name] [facility]  Date of Exam:   02/23/2023 Medical Rec #: [id number]          Accession #:    [id number] Date of Birth: 04-08-1941          Patient Gender: M Patient Age:   52 years Exam Location:  [facility] Procedure:      VAS [place] LOWER EXTREMITY VENOUS (DVT) Referring Phys: Scheryl Marten XU --------------------------------------------------------------------------------  Indications: Stroke.  Limitations: Patient immobility. Comparison Study: No previous exams Performing Technologist: Jody Hill RVT, RDMS  Examination Guidelines: A complete evaluation includes B-mode imaging, spectral Doppler, color Doppler, and power Doppler as needed of all accessible portions of each vessel. Bilateral testing is considered an integral part of a complete examination. Limited examinations for reoccurring indications may be performed as noted. The reflux portion of the exam is performed with the patient in reverse Trendelenburg.  +---------+---------------+---------+-----------+----------+--------------+ RIGHT    CompressibilityPhasicitySpontaneityPropertiesThrombus Aging +---------+---------------+---------+-----------+----------+--------------+ CFV      Full           Yes      Yes                                 +---------+---------------+---------+-----------+----------+--------------+ SFJ      Full                                                        +---------+---------------+---------+-----------+----------+--------------+ FV Prox  Full           Yes      Yes                                 +---------+---------------+---------+-----------+----------+--------------+ FV Mid   Full           Yes      Yes                                 +---------+---------------+---------+-----------+----------+--------------+ FV DistalFull           Yes      Yes                                  +---------+---------------+---------+-----------+----------+--------------+  Physician Discharge Summary   Patient: Eric [name] MRN: [id number] DOB: [birth year]  Admit date:     02/22/2023  Discharge date: 02/24/23  Discharge Physician: Thad Ranger, MD    PCP: Tresa Garter, MD   Recommendations at discharge:   Per neurology, continue aspirin 81 mg daily, Plavix 75 mg daily, 43 weeks and then aspirin alone Continue Crestor 20 mg daily Cardiology consulted for 30-day event monitor as outpatient to rule out A-fib, will arrange  Discharge Diagnoses:      Acute left parietal occipital small infarct   ICH (intracerebral hemorrhage) (HCC) Active Problems:   Essential hypertension   COPD   BPH associated with nocturia   Hyperlipemia    Hospital Course: Patient is a 82 year old male with COPD, HTN, prior history of CVA in 2015, BPH presented with confusion.  He was noted to be more unsteady by his family, at baseline does have gait instability due to arthritis.  Per family, has been gradually getting worse.  On the morning of admission woke up got to the bathroom and could not figure out how to urinate.  He reported to the family that he could not see, brought to the ED.  CT head showed small right frontal SAH.   MRI brain showed small area of acute ischemia in the posterior left parietal lobe Neurology was consulted.  Assessment and Plan:  SAH/ICH (intracerebral hemorrhage) (HCC) -Patient had presented with altered mental status and worsening gait instability. CT head showed small right frontal SAH -Neuro surgery was consulted, recommended stroke work-up, CT head.  No neurosurgical interventions indicated at this time. -Repeat CT head this morning showed no hemorrhage or progression at the patient's acute left parietal cortex infarct, numerous chronic infarcts.    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  Bilateral carotid bifurcation atherosclerosis without hemodynamically significant stenosis. 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  The small PFO was also seen on 2D echo in 2014.  Discussed in detail with neurology, Dr. Roda Shutters, venous Dopplers were negative for any DVT.  Given patient's age and other comorbidities, recommended no further workup of ASD or shunting. -Cardiology consulted for 30-day event monitor, office will arrange -PT evaluation recommended CIR however patient's family requested home health PT, OT    Acute encephalopathy -Likely due to #1, #2 -Improving, close to baseline mental status, confounded by daughter at the bedside  -  PT OT recommended CIR however patient's family requested home health PT OT     Essential hypertension -BP stable     COPD -No acute wheezing     BPH associated with nocturia -Continue finasteride     Hyperlipemia -Lipid panel showed LDL 112, triglycerides 61, cholesterol 183, started Crestor 20 mg daily   Leukocytosis -Unclear etiology, currently no fevers, UA negative for UTI.  Chest x-ray showed atelectasis versus infiltrate at the lung bases, stable or slightly improved   Estimated body mass index is 22.36 kg/m as calculated from the following:  [address] [place] [address] [phone]               Discharge Exam: Ceasar Mons [facility]ights   02/22/23 1229  Weight: 66.7 kg   S: No acute complaints, mental status improving, daughter at the bedside, wants to go home with home health.  Declined CIR.  BP (!) 146/73 (BP Location: Left Arm)   Pulse 69   Temp 98.5 F (36.9 C) (Oral)   Resp 18   Ht 5\' 8"  (1.727 m)   Wt 66.7 kg   SpO2 99%   BMI 22.36 kg/m   Physical Exam General: Alert and oriented, NAD, close to baseline mental status Cardiovascular: S1 S2 clear, RRR.  Respiratory: CTAB Gastrointestinal: Soft, nontender, nondistended, NBS Ext: no pedal edema bilaterally Neuro: no new deficits Psych: Normal affect    Condition at discharge: fair  The results of significant diagnostics from this hospitalization (including imaging, microbiology, ancillary and laboratory) are listed below for reference.   Imaging Studies: VAS [place] LOWER EXTREMITY VENOUS (DVT)  Result Date:  02/23/2023  Lower Venous DVT Study Patient Name:  Eric [name] [facility]  Date of Exam:   02/23/2023 Medical Rec #: [id number]          Accession #:    [id number] Date of Birth: 04-08-1941          Patient Gender: M Patient Age:   52 years Exam Location:  [facility] Procedure:      VAS [place] LOWER EXTREMITY VENOUS (DVT) Referring Phys: Scheryl Marten XU --------------------------------------------------------------------------------  Indications: Stroke.  Limitations: Patient immobility. Comparison Study: No previous exams Performing Technologist: Jody Hill RVT, RDMS  Examination Guidelines: A complete evaluation includes B-mode imaging, spectral Doppler, color Doppler, and power Doppler as needed of all accessible portions of each vessel. Bilateral testing is considered an integral part of a complete examination. Limited examinations for reoccurring indications may be performed as noted. The reflux portion of the exam is performed with the patient in reverse Trendelenburg.  +---------+---------------+---------+-----------+----------+--------------+ RIGHT    CompressibilityPhasicitySpontaneityPropertiesThrombus Aging +---------+---------------+---------+-----------+----------+--------------+ CFV      Full           Yes      Yes                                 +---------+---------------+---------+-----------+----------+--------------+ SFJ      Full                                                        +---------+---------------+---------+-----------+----------+--------------+ FV Prox  Full           Yes      Yes                                 +---------+---------------+---------+-----------+----------+--------------+ FV Mid   Full           Yes      Yes                                 +---------+---------------+---------+-----------+----------+--------------+ FV DistalFull           Yes      Yes                                  +---------+---------------+---------+-----------+----------+--------------+  [address] [place] [address] [phone]               Discharge Exam: Ceasar Mons Weights   02/22/23 1229  Weight: 66.7 kg   S: No acute complaints, mental status improving, daughter at the bedside, wants to go home with home health.  Declined CIR.  BP (!) 146/73 (BP Location: Left Arm)   Pulse 69   Temp 98.5 F (36.9 C) (Oral)   Resp 18   Ht 5\' 8"  (1.727 m)   Wt 66.7 kg   SpO2 99%   BMI 22.36 kg/m   Physical Exam General: Alert and oriented, NAD, close to baseline mental status Cardiovascular: S1 S2 clear, RRR.  Respiratory: CTAB Gastrointestinal: Soft, nontender, nondistended, NBS Ext: no pedal edema bilaterally Neuro: no new deficits Psych: Normal affect    Condition at discharge: fair  The results of significant diagnostics from this hospitalization (including imaging, microbiology, ancillary and laboratory) are listed below for reference.   Imaging Studies: VAS [place] LOWER EXTREMITY VENOUS (DVT)  Result Date:  02/23/2023  Lower Venous DVT Study Patient Name:  Eric [name] [facility]  Date of Exam:   02/23/2023 Medical Rec #: [id number]          Accession #:    [id number] Date of Birth: 04-08-1941          Patient Gender: M Patient Age:   52 years Exam Location:  [facility] Procedure:      VAS [place] LOWER EXTREMITY VENOUS (DVT) Referring Phys: Scheryl Marten XU --------------------------------------------------------------------------------  Indications: Stroke.  Limitations: Patient immobility. Comparison Study: No previous exams Performing Technologist: Jody Hill RVT, RDMS  Examination Guidelines: A complete evaluation includes B-mode imaging, spectral Doppler, color Doppler, and power Doppler as needed of all accessible portions of each vessel. Bilateral testing is considered an integral part of a complete examination. Limited examinations for reoccurring indications may be performed as noted. The reflux portion of the exam is performed with the patient in reverse Trendelenburg.  +---------+---------------+---------+-----------+----------+--------------+ RIGHT    CompressibilityPhasicitySpontaneityPropertiesThrombus Aging +---------+---------------+---------+-----------+----------+--------------+ CFV      Full           Yes      Yes                                 +---------+---------------+---------+-----------+----------+--------------+ SFJ      Full                                                        +---------+---------------+---------+-----------+----------+--------------+ FV Prox  Full           Yes      Yes                                 +---------+---------------+---------+-----------+----------+--------------+ FV Mid   Full           Yes      Yes                                 +---------+---------------+---------+-----------+----------+--------------+ FV DistalFull           Yes      Yes                                  +---------+---------------+---------+-----------+----------+--------------+  POP      Full           Yes      Yes                                  +---------+---------------+---------+-----------+----------+--------------+ PTV      Full                                                        +---------+---------------+---------+-----------+----------+--------------+ PERO     Full                                                        +---------+---------------+---------+-----------+----------+--------------+     Summary: BILATERAL: - No evidence of deep vein thrombosis seen in the lower extremities, bilaterally. -No evidence of popliteal cyst, bilaterally.   *See table(s) above for measurements and observations. Electronically signed by Waverly Ferrari MD on 02/23/2023 at 3:57:45 PM.    Final    ECHOCARDIOGRAM COMPLETE  Result Date: 02/23/2023    ECHOCARDIOGRAM REPORT   Patient Name:   Eric [name] [facility] Date of Exam: 02/23/2023 Medical Rec #:  [id number]         Height:       68.0 in Accession #:    [id number]        Weight:       147.0 lb Date of Birth:  Aug 12, 1941         BSA:          1.793 m Patient Age:    82 years          BP:           152/75 mmHg Patient Gender: M                 HR:           72 bpm. Exam Location:  Inpatient Procedure: 2D Echo, Color Doppler and Cardiac Doppler Indications:    Stroke i63.9  History:        Patient has prior history of Echocardiogram examinations, most                 recent 07/03/2013. COPD; Risk Factors:Hypertension and                 Dyslipidemia.  Sonographer:    Irving Burton Senior RDCS Referring Phys: [id number] ANASTASSIA DOUTOVA  Sonographer Comments: Technically difficult due to thin body habitus and COPD. IMPRESSIONS  1. Left ventricular ejection fraction, by estimation, is 45 to 50%. Left ventricular ejection fraction by 2D MOD biplane is 47.1 %. The left ventricle has mildly decreased function. The left ventricle demonstrates global hypokinesis. Left ventricular diastolic parameters are consistent with Grade I diastolic dysfunction (impaired relaxation). There is the interventricular septum is  flattened in diastole ('D' shaped left ventricle), consistent with right ventricular volume overload.  2. Right ventricular systolic function is mildly reduced. The right ventricular size is moderately enlarged. There is normal pulmonary artery systolic pressure. The estimated right ventricular systolic pressure is 33.4 mmHg.  3. The mitral valve is normal in structure. Trivial  POP      Full           Yes      Yes                                  +---------+---------------+---------+-----------+----------+--------------+ PTV      Full                                                        +---------+---------------+---------+-----------+----------+--------------+ PERO     Full                                                        +---------+---------------+---------+-----------+----------+--------------+     Summary: BILATERAL: - No evidence of deep vein thrombosis seen in the lower extremities, bilaterally. -No evidence of popliteal cyst, bilaterally.   *See table(s) above for measurements and observations. Electronically signed by Waverly Ferrari MD on 02/23/2023 at 3:57:45 PM.    Final    ECHOCARDIOGRAM COMPLETE  Result Date: 02/23/2023    ECHOCARDIOGRAM REPORT   Patient Name:   Eric [name] [facility] Date of Exam: 02/23/2023 Medical Rec #:  [id number]         Height:       68.0 in Accession #:    [id number]        Weight:       147.0 lb Date of Birth:  Aug 12, 1941         BSA:          1.793 m Patient Age:    82 years          BP:           152/75 mmHg Patient Gender: M                 HR:           72 bpm. Exam Location:  Inpatient Procedure: 2D Echo, Color Doppler and Cardiac Doppler Indications:    Stroke i63.9  History:        Patient has prior history of Echocardiogram examinations, most                 recent 07/03/2013. COPD; Risk Factors:Hypertension and                 Dyslipidemia.  Sonographer:    Irving Burton Senior RDCS Referring Phys: [id number] ANASTASSIA DOUTOVA  Sonographer Comments: Technically difficult due to thin body habitus and COPD. IMPRESSIONS  1. Left ventricular ejection fraction, by estimation, is 45 to 50%. Left ventricular ejection fraction by 2D MOD biplane is 47.1 %. The left ventricle has mildly decreased function. The left ventricle demonstrates global hypokinesis. Left ventricular diastolic parameters are consistent with Grade I diastolic dysfunction (impaired relaxation). There is the interventricular septum is  flattened in diastole ('D' shaped left ventricle), consistent with right ventricular volume overload.  2. Right ventricular systolic function is mildly reduced. The right ventricular size is moderately enlarged. There is normal pulmonary artery systolic pressure. The estimated right ventricular systolic pressure is 33.4 mmHg.  3. The mitral valve is normal in structure. Trivial

## 2023-02-24 NOTE — Progress Notes (Signed)
Discharge instructions given. Patient verbalized understanding and all questions were answered.  ?

## 2023-02-24 NOTE — Evaluation (Signed)
Occupational Therapy Evaluation Patient Details Name: Eric [name] MRN: [id number] DOB: 04/24/1941 Today's Date: 02/24/2023   History of Present Illness Pt is an 82 y.o. male who presented 02/22/23 with AMS and R hemianopsia. CT head showed small focus of subarachnoid hemorrhage in the anterior right frontal lobe. MRI showed small area of acute ischemia within the posterior left parietal lobe. PMH: COPD, HTN, prior strokes, BPH, former smoker   Clinical Impression   PTA, pt lives with wife and two sons, typically ambulatory without AD/assist in the home and Modified Independent with ADLs. Pt presents now with deficits in cognition, standing balance, L sided strength and coordination. 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 Overall, pt requires Min A for UB ADL, Mod A for LB ADLs and Min A for in-room mobility with RW. Daughter at bedside reports preference to take pt home with HHPT/OT, reports no DME needs and that both of her brothers can provide 24/7 assist at DC.      Recommendations for follow up therapy are one component of a multi-disciplinary discharge planning process, led by the attending physician.  Recommendations may be updated based on patient status, additional functional criteria and insurance authorization.   Assistance Recommended at Discharge Frequent or constant Supervision/Assistance  Patient can return home with the following A little help with walking and/or transfers;A little help with bathing/dressing/bathroom;Assistance with cooking/housework;Direct supervision/assist for medications management;Direct supervision/assist for financial management;Assist for transportation;Help with stairs or ramp for entrance    Functional Status Assessment  Patient has had a recent decline in their functional status and demonstrates the ability to make significant improvements in function in a  reasonable and predictable amount of time.  Equipment Recommendations  None recommended by OT    Recommendations for Other Services       Precautions / Restrictions Precautions Precautions: Fall Restrictions Weight Bearing Restrictions: No      Mobility Bed Mobility Overal bed mobility: Needs Assistance Bed Mobility: Supine to Sit, Sit to Supine     Supine to sit: HOB elevated, Min guard Sit to supine: Min guard   General bed mobility comments: increased time/effort    Transfers Overall transfer level: Needs assistance Equipment used: Rolling walker (2 wheels), 1 person hand held assist Transfers: Sit to/from Stand Sit to Stand: Mod assist, Min assist           General transfer comment: Min A when pushing from bed, Mod A when pulling on RW      Balance Overall balance assessment: Needs assistance Sitting-balance support: Feet supported, Single extremity supported, Bilateral upper extremity supported Sitting balance-Leahy Scale: Fair     Standing balance support: Bilateral upper extremity supported, Single extremity supported, During functional activity Standing balance-Leahy Scale: Poor                             ADL either performed or assessed with clinical judgement   ADL Overall ADL's : Needs assistance/impaired Eating/Feeding: Set up   Grooming: Minimal assistance;Standing   Upper Body Bathing: Minimal assistance;Sitting   Lower Body Bathing: Moderate assistance;Sit to/from stand   Upper Body Dressing : Minimal assistance;Sitting   Lower Body Dressing: Moderate assistance;Sit to/from stand;Sitting/lateral leans   Toilet Transfer: Minimal assistance;Ambulation;Rolling walker (2 wheels)   Toileting- Clothing Manipulation and Hygiene: Moderate assistance;Sitting/lateral lean;Sit to/from stand       Functional mobility during ADLs: Minimal assistance;Rolling walker (  2 wheels);Cueing for sequencing;Cueing for safety General ADL  Comments: Pt requiring hands on assist for all standing, mobility and LB ADLs. Educated pt/daughter present for use of gait belt, hands on assist for all mobility, avoiding tub soaking initially and likely able to do better in familiar environment due to impaired vision     Vision Baseline Vision/History:  (hx of visual impairments from prior stroke; hx of cataract removal; does not wear glasses anymore.) Ability to See in Adequate Light: 3 Highly impaired Patient Visual Report: Other (comment) (reports better than it was on Saturday; difficult to discern if new visual issues) Vision Assessment?: Vision impaired- to be further tested in functional context;Yes Eye Alignment: Impaired (comment) (R eye abduction) Ocular Range of Motion: Within Functional Limits Alignment/Gaze Preference: Other (comment) (R eye gaze to R) Tracking/Visual Pursuits: Able to track stimulus in all quads without difficulty Convergence: Impaired (comment) (baseline) Visual Fields: Left visual field deficit;Impaired-to be further tested in functional context Additional Comments: impaired L superior/peripheral field. pt with hx of impaired vision. able to see tv outline but not what was on the screen, unable to read large print signs in room. able to see therapist outline/face and eyes     Perception     Praxis      Pertinent Vitals/Pain Pain Assessment Pain Assessment: No/denies pain     Hand Dominance Left   Extremity/Trunk Assessment Upper Extremity Assessment Upper Extremity Assessment: LUE deficits/detail LUE Deficits / Details: grasp WFL; biceps/triceps/shoulder 3+/5; weaker than R nondominant UE   Lower Extremity Assessment Lower Extremity Assessment: Defer to PT evaluation   Cervical / Trunk Assessment Cervical / Trunk Assessment: Kyphotic   Communication Communication Communication: No difficulties   Cognition Arousal/Alertness: Awake/alert Behavior During Therapy: Flat affect,  Restless Overall Cognitive Status: Impaired/Different from baseline Area of Impairment: Attention, Memory, Following commands, Safety/judgement, Awareness, Problem solving                   Current Attention Level: Sustained Memory: Decreased short-term memory Following Commands: Follows one step commands consistently, Follows one step commands with increased time, Follows multi-step commands inconsistently Safety/Judgement: Decreased awareness of safety, Decreased awareness of deficits Awareness: Intellectual, Emergent Problem Solving: Requires verbal cues, Requires tactile cues, Difficulty sequencing, Decreased initiation General Comments: fidgeting with tele monitor/gait belt but feel this is due to low vision. cues for safety/DME use needed but easily corrected. pleasant, follows commands with increased time     General Comments  Daughter reports preference for home rather than rehab    Exercises     Shoulder Instructions      Home Living Family/patient expects to be discharged to:: Private residence Living Arrangements: Spouse/significant other;Children (2 sons) Available Help at Discharge: Family;Available 24 hours/day Type of Home: House Home Access: Stairs to enter [employer] of Steps: 2 Entrance Stairs-Rails: None Home Layout: One level     Bathroom Shower/Tub: Chief Strategy Officer: Standard     Home Equipment: Toilet riser;Cane - single point;Rolling Environmental consultant (2 wheels);Wheelchair - manual;BSC/3in1;Shower seat          Prior Functioning/Environment Prior Level of Function : Needs assist             Mobility Comments: uses cane outside with +1 assist, furniture surfs in home mod I, hx of falls ADLs Comments: likes to take baths but family encouraged pt to avoid this due to hx of falls; able to perform pericare and dress himself; family manages his meds and finances; does not  drive; very sharp cognitively per family         OT Problem List: Decreased strength;Decreased activity tolerance;Impaired balance (sitting and/or standing);Impaired vision/perception;Decreased coordination;Decreased cognition;Decreased safety awareness;Decreased knowledge of use of DME or AE      OT Treatment/Interventions: Self-care/ADL training;Therapeutic exercise;DME and/or AE instruction;Therapeutic activities;Patient/family education    OT Goals(Current goals can be found in the care plan section) Acute Rehab OT Goals Patient Stated Goal: go home OT Goal Formulation: With patient/family Time For Goal Achievement: 03/10/23 Potential to Achieve Goals: Good  OT Frequency: Min 2X/week    Co-evaluation              AM-PAC OT "6 Clicks" Daily Activity     Outcome Measure Help from another person eating meals?: A Little Help from another person taking care of personal grooming?: A Little Help from another person toileting, which includes using toliet, bedpan, or urinal?: A Lot Help from another person bathing (including washing, rinsing, drying)?: A Lot Help from another person to put on and taking off regular upper body clothing?: A Little Help from another person to put on and taking off regular lower body clothing?: A Lot 6 Click Score: 15   End of Session Equipment Utilized During Treatment: Gait belt;Rolling walker (2 wheels) Nurse Communication: Mobility status  Activity Tolerance: Patient tolerated treatment well Patient left: in bed;with call bell/phone within reach;with bed alarm set;with family/visitor present  OT Visit Diagnosis: Unsteadiness on feet (R26.81);Other abnormalities of gait and mobility (R26.89);Muscle weakness (generalized) (M62.81);Low vision, both eyes (H54.2);Other symptoms and signs involving cognitive function                Time: 1610-9604 OT Time Calculation (min): 35 min Charges:  OT General Charges $OT Visit: 1 Visit OT Evaluation $OT Eval Moderate Complexity: 1 Mod OT Treatments $Self  Care/Home Management : 8-22 mins  Bradd Canary, OTR/L Acute Rehab Services Office: [phone]   Lorre Munroe 02/24/2023, 10:05 AM

## 2023-02-24 NOTE — TOC Transition Note (Signed)
Transition of Care [facility]) - CM/SW Discharge Note   Patient Details  Name: Eric [name] MRN: [id number] Date of Birth: [birth year]  [facility] CM/SW Contact:  Kermit Balo, RN Phone Number: 02/24/2023, 10:57 AM   Clinical Narrative:    Patient is discharging home with home health services. Family prefers home over CIR. CM met with the patient and his son. They have no preference on home health agency. [facility] has accepted. Information on the AVS.  Pt will have 24 hour supervision at home. Son provides his medications and denies any issues.  Daughter provides needed transportation.  Son feels they can get him home in private vehicle.  DME at home: rollator/ walker/ [facility]  Final next level of care: Home w Home Health Services Barriers to Discharge: No Barriers Identified   Patient Goals and CMS Choice CMS Medicare.gov Compare Post Acute Care list provided to:: Patient Represenative (must comment) Choice offered to / list presented to : Adult Children  Discharge Placement                         Discharge Plan and Services Additional resources added to the After Visit Summary for                            [facility] Arranged: PT, OT [facility] Agency: [facility] Date [facility] Agency Contacted: 02/24/23   Representative spoke with at [facility] Agency: Tresa Endo  Social Determinants of Health (SDOH) Interventions SDOH Screenings   Food Insecurity: No Food Insecurity (02/22/2023)  Housing: Low Risk  (02/22/2023)  Transportation Needs: No Transportation Needs (02/22/2023)  Utilities: Not At Risk (02/22/2023)  Depression (PHQ2-9): Low Risk  (12/26/2022)  Tobacco Use: Medium Risk (02/22/2023)     Readmission Risk Interventions     No data to display

## 2023-02-24 NOTE — Progress Notes (Signed)
Mobility Specialist Progress Note   02/24/23 1140  Mobility  Activity Stood at bedside  Level of Assistance Moderate assist, patient does 50-74%  Assistive Device Front wheel walker  Distance Ambulated (ft) 2 ft  Activity Response Tolerated well  Mobility Referral Yes  $Mobility charge 1 Mobility  Mobility Specialist Start Time (ACUTE ONLY) 1140  Mobility Specialist Stop Time (ACUTE ONLY) 1216  Mobility Specialist Time Calculation (min) (ACUTE ONLY) 36 min   Received pt in bed having no complaints and agreeable. MinA to get pt EOB and modA to stand accompanied by gross directional cues throughout d/t vision deficit. Pt able to march in place but requiring cues on foot placement d/t a narrow BOS. X1 seated rest break d/t fatigue follow by one more STS, no incidents throughout. Returned back supine in bed w/ minG and all needs met.  Frederico Hamman Mobility Specialist Please contact via SecureChat or  Rehab office at [phone]

## 2023-02-25 ENCOUNTER — Other Ambulatory Visit: Payer: Self-pay | Admitting: Cardiology

## 2023-02-25 ENCOUNTER — Encounter: Payer: Self-pay | Admitting: *Deleted

## 2023-02-25 DIAGNOSIS — R9431 Abnormal electrocardiogram [ECG] [EKG]: Secondary | ICD-10-CM

## 2023-02-25 DIAGNOSIS — I499 Cardiac arrhythmia, unspecified: Secondary | ICD-10-CM

## 2023-02-25 DIAGNOSIS — I639 Cerebral infarction, unspecified: Secondary | ICD-10-CM

## 2023-02-25 NOTE — Progress Notes (Signed)
   Cardiology asked to place cardiac monitor in the setting of CVA. 

## 2023-02-25 NOTE — Progress Notes (Signed)
Patient ID: Eric [name], male   DOB: [birth year], 82 y.o.   MRN: [id number] Patient enrolled for [employer] to ship a 30 day cardiac event monitor to his address on file.  Letter with instructions mailed to patient.  Dr. Rennis Golden to read.

## 2023-02-26 ENCOUNTER — Telehealth: Payer: Self-pay | Admitting: *Deleted

## 2023-02-26 ENCOUNTER — Encounter: Payer: Self-pay | Admitting: *Deleted

## 2023-02-26 DIAGNOSIS — I110 Hypertensive heart disease with heart failure: Secondary | ICD-10-CM | POA: Diagnosis not present

## 2023-02-26 DIAGNOSIS — H269 Unspecified cataract: Secondary | ICD-10-CM | POA: Diagnosis not present

## 2023-02-26 DIAGNOSIS — G9340 Encephalopathy, unspecified: Secondary | ICD-10-CM | POA: Diagnosis not present

## 2023-02-26 DIAGNOSIS — D72829 Elevated white blood cell count, unspecified: Secondary | ICD-10-CM | POA: Diagnosis not present

## 2023-02-26 DIAGNOSIS — Z7982 Long term (current) use of aspirin: Secondary | ICD-10-CM | POA: Diagnosis not present

## 2023-02-26 DIAGNOSIS — I69354 Hemiplegia and hemiparesis following cerebral infarction affecting left non-dominant side: Secondary | ICD-10-CM | POA: Diagnosis not present

## 2023-02-26 DIAGNOSIS — Z7902 Long term (current) use of antithrombotics/antiplatelets: Secondary | ICD-10-CM | POA: Diagnosis not present

## 2023-02-26 DIAGNOSIS — I6523 Occlusion and stenosis of bilateral carotid arteries: Secondary | ICD-10-CM | POA: Diagnosis not present

## 2023-02-26 DIAGNOSIS — E785 Hyperlipidemia, unspecified: Secondary | ICD-10-CM | POA: Diagnosis not present

## 2023-02-26 DIAGNOSIS — Z556 Problems related to health literacy: Secondary | ICD-10-CM | POA: Diagnosis not present

## 2023-02-26 DIAGNOSIS — I509 Heart failure, unspecified: Secondary | ICD-10-CM | POA: Diagnosis not present

## 2023-02-26 DIAGNOSIS — H547 Unspecified visual loss: Secondary | ICD-10-CM | POA: Diagnosis not present

## 2023-02-26 DIAGNOSIS — H5347 Heteronymous bilateral field defects: Secondary | ICD-10-CM | POA: Diagnosis not present

## 2023-02-26 DIAGNOSIS — J449 Chronic obstructive pulmonary disease, unspecified: Secondary | ICD-10-CM | POA: Diagnosis not present

## 2023-02-26 DIAGNOSIS — I69319 Unspecified symptoms and signs involving cognitive functions following cerebral infarction: Secondary | ICD-10-CM | POA: Diagnosis not present

## 2023-02-26 DIAGNOSIS — I69398 Other sequelae of cerebral infarction: Secondary | ICD-10-CM | POA: Diagnosis not present

## 2023-02-26 DIAGNOSIS — M1990 Unspecified osteoarthritis, unspecified site: Secondary | ICD-10-CM | POA: Diagnosis not present

## 2023-02-26 NOTE — Transitions of Care (Post Inpatient/ED Visit) (Signed)
02/26/2023  Name: Eric [name] MRN: [id number] DOB: 1940-12-29  Today's TOC FU Call Status: Today's TOC FU Call Status:: Successful TOC FU Call Competed TOC FU Call Complete Date: 02/26/23  Transition Care Management Follow-up Telephone Call Date of Discharge: 02/24/23 Discharge Facility: [facility]) Type of Discharge: Inpatient Admission Primary Inpatient Discharge Diagnosis:: SAH/ ICH; CVA How have you been since you were released from the hospital?: Better (per daughter/ caregiver janice: "He is doing overall very well.  He is able to walk some, but my brother helps him with most everything.  The home health nurse just left, said his BP was good.  I am managing all of his health care") Any questions or concerns?: No  Items Reviewed: Did you receive and understand the discharge instructions provided?: Yes (thoroughly reviewed with patient's daughter/ caregiver who verbalizes good understanding of same) Medications obtained,verified, and reconciled?: Yes (Medications Reviewed) (Full medication reconciliation/ review completed; no concerns or discrepancies identified; confirmed patient obtained/ is taking all newly Rx'd medications as instructed; daughter-manages medications and denies questions/ concerns around medications) Any new allergies since your discharge?: No Dietary orders reviewed?: Yes Type of Diet Ordered:: Low salt Do you have support at home?: Yes People in Home: child(ren), adult, spouse Name of Support/Comfort Primary Source: Daughter/ caregiver reports patient requires assistance for most of self-care needs post-hospital discharge on 02/24/23; resides with his spouse and with his adult son; [name] daughter checks in daily; spouse and son/ daughter assists as/ if needed/ indicated  Medications Reviewed Today: Medications Reviewed Today     Reviewed by Michaela Corner, RN (Registered Nurse) on 02/26/23 at 1424  Med List Status: <None>   Medication Order Taking?  Sig Documenting Provider Last Dose Status Informant  acetaminophen (TYLENOL) 650 MG CR tablet [id number] Yes Take 650 mg by mouth at bedtime. For arthritis in back [provider] Taking Active Family Member  amLODipine-benazepril (LOTREL) 5-20 MG capsule [id number] Yes TAKE 1 CAPSULE BY MOUTH EVERY DAY  Patient taking differently: Take 1 capsule by mouth daily.   Plotnikov, Georgina Quint, MD Taking Active Family Member  aspirin EC 81 MG tablet [id number] Yes Take 1 tablet (81 mg total) by mouth daily. Swallow whole. Cathren Harsh, MD Taking Active            Med Note Otelia Limes Feb 26, 2023  2:23 PM) 02/26/23: daughter reports during [facility] call she is going to pick up TODAY- advised to pick up and start giving as instructed   clopidogrel (PLAVIX) 75 MG tablet [id number] Yes Take 1 tablet (75 mg total) by mouth daily for 21 days. Rai, Delene Ruffini, MD Taking Active   Cyanocobalamin (VITAMIN B-12) 1000 MCG SUBL [id number] Yes Place 1 tablet (1,000 mcg total) under the tongue daily. Plotnikov, Georgina Quint, MD Taking Active Family Member  ferrous sulfate 325 (65 FE) MG tablet [id number] Yes Take 1 tablet (325 mg total) by mouth 2 (two) times daily with a meal.  Patient taking differently: Take 325 mg by mouth daily with breakfast.   Plotnikov, Georgina Quint, MD Taking Active Family Member  finasteride (PROSCAR) 5 MG tablet [id number] Yes Take 1 tablet (5 mg total) by mouth daily. Plotnikov, Georgina Quint, MD Taking Active Family Member           Med Note Otelia Limes Feb 26, 2023  2:21 PM) 02/26/23: daughter reports during [facility] call she is not sure if patient is still taking this  medication- to clarify and update at time of HFU with PCP on 03/13/23   oxybutynin (DITROPAN-XL) 5 MG 24 hr tablet [id number] Yes TAKE 1 TABLET (5 MG TOTAL) BY MOUTH AT BEDTIME. FOR FREQUENT URINATION Plotnikov, Georgina Quint, MD Taking Active Family Member           Med Note Otelia Limes Feb 26, 2023  2:21 PM)  02/26/23: daughter reports during [facility] call she is not sure if patient is still taking this medication- to clarify and update at time of HFU with PCP on 03/13/23   pantoprazole (PROTONIX) 40 MG tablet [id number] Yes Take 1 tablet (40 mg total) by mouth daily. Cathren Harsh, MD Taking Active            Med Note Otelia Limes Feb 26, 2023  2:21 PM) 02/26/23: daughter reports during [facility] call she is not sure if patient is still taking this medication- to clarify and update at time of HFU with PCP on 03/13/23  rosuvastatin (CRESTOR) 20 MG tablet [id number] Yes Take 1 tablet (20 mg total) by mouth daily. Rai, Delene Ruffini, MD Taking Active   traMADol (ULTRAM) 50 MG tablet [id number] Yes TAKE 1 TABLET 3 TIMES A DAY AS NEEDED SEVERE PAIN  Patient taking differently: Take 50 mg by mouth 3 (three) times daily as needed for severe pain.   Plotnikov, Georgina Quint, MD Taking Active Family Member            Home Care and Equipment/Supplies: Were Home Health Services Ordered?: Yes Name of Home Health Agency:: [facility]-- confirmed RN arrived today; PT and OT to start soon Has Agency set up a time to come to your home?: Yes First Home Health Visit Date: 02/26/23 Any new equipment or medical supplies ordered?: No  Functional Questionnaire: Do you need assistance with bathing/showering or dressing?: Yes (family members assist as/ if needed) Do you need assistance with meal preparation?: Yes (family prepares all meals) Do you need assistance with eating?: No Do you have difficulty maintaining continence: Yes (family members assist as/ if needed) Do you need assistance with getting out of bed/getting out of a chair/moving?: Yes (family members assist as/ if needed) Do you have difficulty managing or taking your medications?: Yes (daughter [name] manages all aspects of medication administration)  Follow up appointments reviewed: PCP Follow-up appointment confirmed?: Yes (care coordination outreach in  real-time with scheduling care guide to successfully schedule hospital follow up PCP appointment 03/13/23) Date of PCP follow-up appointment?: 03/13/23 (verified this is recommended time frame for follow up per hospital discharging provider notes) Follow-up Provider: PCP Specialist Hospital Follow-up appointment confirmed?: No (neurology referral placed at time of hospital discharge-- recommended for follow up with neurology within one month- daughter has not yet heard from neurology office to schedule- states she will reach out next week if she doesn't hear frokm them by then) Reason Specialist Follow-Up Not Confirmed: Patient has Specialist Provider Number and will Call for Appointment (confirmed daughter has neurology office number to schedule: reinforced nnumber to call) Do you need transportation to your follow-up appointment?: No Do you understand care options if your condition(s) worsen?: Yes-patient verbalized understanding  SDOH Interventions Today    Flowsheet Row Most Recent Value  SDOH Interventions   Food Insecurity Interventions Intervention Not Indicated  Transportation Interventions Intervention Not Indicated  [daughter/ caregiver [name] provides transportation]      TOC Interventions Today    Flowsheet Row Most Recent Value  TOC  Interventions   TOC Interventions Discussed/Reviewed TOC Interventions Discussed, Arranged PCP follow up less than 12 days/Care Guide scheduled      Interventions Today    Flowsheet Row Most Recent Value  Chronic Disease   Chronic disease during today's visit Other  [CVA/ SAH/ ICH]  General Interventions   General Interventions Discussed/Reviewed General Interventions Discussed, Doctor Visits, Durable Medical Equipment (DME), Communication with, Referral to Nurse  Doctor Visits Discussed/Reviewed Specialist, Doctor Visits Discussed, PCP  Durable Medical Equipment (DME) Dan Humphreys  [daughter reports has walker, currently does not use]   PCP/Specialist Visits Compliance with follow-up visit  Communication with RN  Exercise Interventions   Exercise Discussed/Reviewed Exercise Discussed  [Home Health services]  Education Interventions   Education Provided Provided Education  Provided Verbal Education On Medication  [reinforced time limits on Plavix per hospital discharge instructions,  encouraged/ educated around need to promnptly obtain and start giving ASA as recommended post-hospital discharge]  Nutrition Interventions   Nutrition Discussed/Reviewed Nutrition Discussed, Decreasing salt  Pharmacy Interventions   Pharmacy Dicussed/Reviewed Pharmacy Topics Discussed  [Full medication review with updating medication list in EHR per patient report]  Safety Interventions   Safety Discussed/Reviewed Safety Discussed      Caryl Pina, RN, BSN, CCRN Alumnus RN CM Care Coordination/ Transition of Care- [facility] Care Management [phone]: direct office

## 2023-03-01 DIAGNOSIS — I639 Cerebral infarction, unspecified: Secondary | ICD-10-CM | POA: Diagnosis not present

## 2023-03-01 DIAGNOSIS — R9431 Abnormal electrocardiogram [ECG] [EKG]: Secondary | ICD-10-CM | POA: Diagnosis not present

## 2023-03-01 DIAGNOSIS — I499 Cardiac arrhythmia, unspecified: Secondary | ICD-10-CM

## 2023-03-03 DIAGNOSIS — G9340 Encephalopathy, unspecified: Secondary | ICD-10-CM | POA: Diagnosis not present

## 2023-03-03 DIAGNOSIS — Z556 Problems related to health literacy: Secondary | ICD-10-CM | POA: Diagnosis not present

## 2023-03-03 DIAGNOSIS — I110 Hypertensive heart disease with heart failure: Secondary | ICD-10-CM | POA: Diagnosis not present

## 2023-03-03 DIAGNOSIS — M1990 Unspecified osteoarthritis, unspecified site: Secondary | ICD-10-CM | POA: Diagnosis not present

## 2023-03-03 DIAGNOSIS — E785 Hyperlipidemia, unspecified: Secondary | ICD-10-CM | POA: Diagnosis not present

## 2023-03-03 DIAGNOSIS — Z7982 Long term (current) use of aspirin: Secondary | ICD-10-CM | POA: Diagnosis not present

## 2023-03-03 DIAGNOSIS — H5347 Heteronymous bilateral field defects: Secondary | ICD-10-CM | POA: Diagnosis not present

## 2023-03-03 DIAGNOSIS — H269 Unspecified cataract: Secondary | ICD-10-CM | POA: Diagnosis not present

## 2023-03-03 DIAGNOSIS — I69398 Other sequelae of cerebral infarction: Secondary | ICD-10-CM | POA: Diagnosis not present

## 2023-03-03 DIAGNOSIS — Z7902 Long term (current) use of antithrombotics/antiplatelets: Secondary | ICD-10-CM | POA: Diagnosis not present

## 2023-03-03 DIAGNOSIS — J449 Chronic obstructive pulmonary disease, unspecified: Secondary | ICD-10-CM | POA: Diagnosis not present

## 2023-03-03 DIAGNOSIS — I509 Heart failure, unspecified: Secondary | ICD-10-CM | POA: Diagnosis not present

## 2023-03-03 DIAGNOSIS — H547 Unspecified visual loss: Secondary | ICD-10-CM | POA: Diagnosis not present

## 2023-03-03 DIAGNOSIS — I6523 Occlusion and stenosis of bilateral carotid arteries: Secondary | ICD-10-CM | POA: Diagnosis not present

## 2023-03-03 DIAGNOSIS — I69319 Unspecified symptoms and signs involving cognitive functions following cerebral infarction: Secondary | ICD-10-CM | POA: Diagnosis not present

## 2023-03-03 DIAGNOSIS — I69354 Hemiplegia and hemiparesis following cerebral infarction affecting left non-dominant side: Secondary | ICD-10-CM | POA: Diagnosis not present

## 2023-03-03 DIAGNOSIS — D72829 Elevated white blood cell count, unspecified: Secondary | ICD-10-CM | POA: Diagnosis not present

## 2023-03-04 ENCOUNTER — Telehealth: Payer: Self-pay | Admitting: Internal Medicine

## 2023-03-04 DIAGNOSIS — I110 Hypertensive heart disease with heart failure: Secondary | ICD-10-CM | POA: Diagnosis not present

## 2023-03-04 DIAGNOSIS — I509 Heart failure, unspecified: Secondary | ICD-10-CM | POA: Diagnosis not present

## 2023-03-04 DIAGNOSIS — D72829 Elevated white blood cell count, unspecified: Secondary | ICD-10-CM | POA: Diagnosis not present

## 2023-03-04 DIAGNOSIS — J449 Chronic obstructive pulmonary disease, unspecified: Secondary | ICD-10-CM | POA: Diagnosis not present

## 2023-03-04 DIAGNOSIS — G9340 Encephalopathy, unspecified: Secondary | ICD-10-CM | POA: Diagnosis not present

## 2023-03-04 DIAGNOSIS — Z7982 Long term (current) use of aspirin: Secondary | ICD-10-CM | POA: Diagnosis not present

## 2023-03-04 DIAGNOSIS — I6523 Occlusion and stenosis of bilateral carotid arteries: Secondary | ICD-10-CM | POA: Diagnosis not present

## 2023-03-04 DIAGNOSIS — E785 Hyperlipidemia, unspecified: Secondary | ICD-10-CM | POA: Diagnosis not present

## 2023-03-04 DIAGNOSIS — H269 Unspecified cataract: Secondary | ICD-10-CM | POA: Diagnosis not present

## 2023-03-04 DIAGNOSIS — I69354 Hemiplegia and hemiparesis following cerebral infarction affecting left non-dominant side: Secondary | ICD-10-CM | POA: Diagnosis not present

## 2023-03-04 DIAGNOSIS — M1990 Unspecified osteoarthritis, unspecified site: Secondary | ICD-10-CM | POA: Diagnosis not present

## 2023-03-04 DIAGNOSIS — I69319 Unspecified symptoms and signs involving cognitive functions following cerebral infarction: Secondary | ICD-10-CM | POA: Diagnosis not present

## 2023-03-04 DIAGNOSIS — H5347 Heteronymous bilateral field defects: Secondary | ICD-10-CM | POA: Diagnosis not present

## 2023-03-04 DIAGNOSIS — Z556 Problems related to health literacy: Secondary | ICD-10-CM | POA: Diagnosis not present

## 2023-03-04 DIAGNOSIS — H547 Unspecified visual loss: Secondary | ICD-10-CM | POA: Diagnosis not present

## 2023-03-04 DIAGNOSIS — I69398 Other sequelae of cerebral infarction: Secondary | ICD-10-CM | POA: Diagnosis not present

## 2023-03-04 DIAGNOSIS — Z7902 Long term (current) use of antithrombotics/antiplatelets: Secondary | ICD-10-CM | POA: Diagnosis not present

## 2023-03-04 NOTE — Telephone Encounter (Signed)
Speech Therapy- Evaluation Physical Therapy 1w- 5 weeks  Best call back  Jae Dire white [phone]

## 2023-03-04 NOTE — Telephone Encounter (Signed)
Called Eric [name] no answer LMOM ok for verbal. Pt is in good standards with appt. Next appt is 03/13/23.Marland KitchenRaechel Chute

## 2023-03-04 NOTE — Telephone Encounter (Signed)
[facility] called back to also request skilled nursing orders, Dr. Posey Rea gave a verbal ok.

## 2023-03-07 NOTE — Telephone Encounter (Signed)
Occupational Therapy  1 Week for 5 weeks  Domingo Pulse [phone]

## 2023-03-08 DIAGNOSIS — Z7902 Long term (current) use of antithrombotics/antiplatelets: Secondary | ICD-10-CM | POA: Diagnosis not present

## 2023-03-08 DIAGNOSIS — H547 Unspecified visual loss: Secondary | ICD-10-CM | POA: Diagnosis not present

## 2023-03-08 DIAGNOSIS — H269 Unspecified cataract: Secondary | ICD-10-CM | POA: Diagnosis not present

## 2023-03-08 DIAGNOSIS — I69319 Unspecified symptoms and signs involving cognitive functions following cerebral infarction: Secondary | ICD-10-CM | POA: Diagnosis not present

## 2023-03-08 DIAGNOSIS — H5347 Heteronymous bilateral field defects: Secondary | ICD-10-CM | POA: Diagnosis not present

## 2023-03-08 DIAGNOSIS — M1990 Unspecified osteoarthritis, unspecified site: Secondary | ICD-10-CM | POA: Diagnosis not present

## 2023-03-08 DIAGNOSIS — Z7982 Long term (current) use of aspirin: Secondary | ICD-10-CM | POA: Diagnosis not present

## 2023-03-08 DIAGNOSIS — I6523 Occlusion and stenosis of bilateral carotid arteries: Secondary | ICD-10-CM | POA: Diagnosis not present

## 2023-03-08 DIAGNOSIS — D72829 Elevated white blood cell count, unspecified: Secondary | ICD-10-CM | POA: Diagnosis not present

## 2023-03-08 DIAGNOSIS — I69398 Other sequelae of cerebral infarction: Secondary | ICD-10-CM | POA: Diagnosis not present

## 2023-03-08 DIAGNOSIS — J449 Chronic obstructive pulmonary disease, unspecified: Secondary | ICD-10-CM | POA: Diagnosis not present

## 2023-03-08 DIAGNOSIS — Z556 Problems related to health literacy: Secondary | ICD-10-CM | POA: Diagnosis not present

## 2023-03-08 DIAGNOSIS — G9340 Encephalopathy, unspecified: Secondary | ICD-10-CM | POA: Diagnosis not present

## 2023-03-08 DIAGNOSIS — E785 Hyperlipidemia, unspecified: Secondary | ICD-10-CM | POA: Diagnosis not present

## 2023-03-08 DIAGNOSIS — I110 Hypertensive heart disease with heart failure: Secondary | ICD-10-CM | POA: Diagnosis not present

## 2023-03-08 DIAGNOSIS — I509 Heart failure, unspecified: Secondary | ICD-10-CM | POA: Diagnosis not present

## 2023-03-08 DIAGNOSIS — I69354 Hemiplegia and hemiparesis following cerebral infarction affecting left non-dominant side: Secondary | ICD-10-CM | POA: Diagnosis not present

## 2023-03-09 NOTE — Telephone Encounter (Signed)
Okay. Thank you.

## 2023-03-10 NOTE — Telephone Encounter (Signed)
Called Eric [name] no answer LMOM w/MD response...[name]

## 2023-03-11 DIAGNOSIS — I509 Heart failure, unspecified: Secondary | ICD-10-CM | POA: Diagnosis not present

## 2023-03-11 DIAGNOSIS — H5347 Heteronymous bilateral field defects: Secondary | ICD-10-CM | POA: Diagnosis not present

## 2023-03-11 DIAGNOSIS — I6523 Occlusion and stenosis of bilateral carotid arteries: Secondary | ICD-10-CM | POA: Diagnosis not present

## 2023-03-11 DIAGNOSIS — I69398 Other sequelae of cerebral infarction: Secondary | ICD-10-CM | POA: Diagnosis not present

## 2023-03-11 DIAGNOSIS — J449 Chronic obstructive pulmonary disease, unspecified: Secondary | ICD-10-CM | POA: Diagnosis not present

## 2023-03-11 DIAGNOSIS — Z7982 Long term (current) use of aspirin: Secondary | ICD-10-CM | POA: Diagnosis not present

## 2023-03-11 DIAGNOSIS — I69354 Hemiplegia and hemiparesis following cerebral infarction affecting left non-dominant side: Secondary | ICD-10-CM | POA: Diagnosis not present

## 2023-03-11 DIAGNOSIS — H269 Unspecified cataract: Secondary | ICD-10-CM | POA: Diagnosis not present

## 2023-03-11 DIAGNOSIS — I110 Hypertensive heart disease with heart failure: Secondary | ICD-10-CM | POA: Diagnosis not present

## 2023-03-11 DIAGNOSIS — E785 Hyperlipidemia, unspecified: Secondary | ICD-10-CM | POA: Diagnosis not present

## 2023-03-11 DIAGNOSIS — M1990 Unspecified osteoarthritis, unspecified site: Secondary | ICD-10-CM | POA: Diagnosis not present

## 2023-03-11 DIAGNOSIS — H547 Unspecified visual loss: Secondary | ICD-10-CM | POA: Diagnosis not present

## 2023-03-11 DIAGNOSIS — D72829 Elevated white blood cell count, unspecified: Secondary | ICD-10-CM | POA: Diagnosis not present

## 2023-03-11 DIAGNOSIS — G9340 Encephalopathy, unspecified: Secondary | ICD-10-CM | POA: Diagnosis not present

## 2023-03-11 DIAGNOSIS — Z556 Problems related to health literacy: Secondary | ICD-10-CM | POA: Diagnosis not present

## 2023-03-11 DIAGNOSIS — I69319 Unspecified symptoms and signs involving cognitive functions following cerebral infarction: Secondary | ICD-10-CM | POA: Diagnosis not present

## 2023-03-11 DIAGNOSIS — Z7902 Long term (current) use of antithrombotics/antiplatelets: Secondary | ICD-10-CM | POA: Diagnosis not present

## 2023-03-12 DIAGNOSIS — I69354 Hemiplegia and hemiparesis following cerebral infarction affecting left non-dominant side: Secondary | ICD-10-CM | POA: Diagnosis not present

## 2023-03-12 DIAGNOSIS — J449 Chronic obstructive pulmonary disease, unspecified: Secondary | ICD-10-CM | POA: Diagnosis not present

## 2023-03-12 DIAGNOSIS — D72829 Elevated white blood cell count, unspecified: Secondary | ICD-10-CM | POA: Diagnosis not present

## 2023-03-12 DIAGNOSIS — E785 Hyperlipidemia, unspecified: Secondary | ICD-10-CM | POA: Diagnosis not present

## 2023-03-12 DIAGNOSIS — Z556 Problems related to health literacy: Secondary | ICD-10-CM | POA: Diagnosis not present

## 2023-03-12 DIAGNOSIS — I6523 Occlusion and stenosis of bilateral carotid arteries: Secondary | ICD-10-CM | POA: Diagnosis not present

## 2023-03-12 DIAGNOSIS — I509 Heart failure, unspecified: Secondary | ICD-10-CM | POA: Diagnosis not present

## 2023-03-12 DIAGNOSIS — M1990 Unspecified osteoarthritis, unspecified site: Secondary | ICD-10-CM | POA: Diagnosis not present

## 2023-03-12 DIAGNOSIS — Z7982 Long term (current) use of aspirin: Secondary | ICD-10-CM | POA: Diagnosis not present

## 2023-03-12 DIAGNOSIS — I69319 Unspecified symptoms and signs involving cognitive functions following cerebral infarction: Secondary | ICD-10-CM | POA: Diagnosis not present

## 2023-03-12 DIAGNOSIS — I110 Hypertensive heart disease with heart failure: Secondary | ICD-10-CM | POA: Diagnosis not present

## 2023-03-12 DIAGNOSIS — H5347 Heteronymous bilateral field defects: Secondary | ICD-10-CM | POA: Diagnosis not present

## 2023-03-12 DIAGNOSIS — H547 Unspecified visual loss: Secondary | ICD-10-CM | POA: Diagnosis not present

## 2023-03-12 DIAGNOSIS — H269 Unspecified cataract: Secondary | ICD-10-CM | POA: Diagnosis not present

## 2023-03-12 DIAGNOSIS — Z7902 Long term (current) use of antithrombotics/antiplatelets: Secondary | ICD-10-CM | POA: Diagnosis not present

## 2023-03-12 DIAGNOSIS — I69398 Other sequelae of cerebral infarction: Secondary | ICD-10-CM | POA: Diagnosis not present

## 2023-03-12 DIAGNOSIS — G9340 Encephalopathy, unspecified: Secondary | ICD-10-CM | POA: Diagnosis not present

## 2023-03-13 ENCOUNTER — Encounter: Payer: Self-pay | Admitting: Internal Medicine

## 2023-03-13 ENCOUNTER — Ambulatory Visit (INDEPENDENT_AMBULATORY_CARE_PROVIDER_SITE_OTHER): Payer: Medicare Other | Admitting: Internal Medicine

## 2023-03-13 VITALS — BP 122/82 | HR 125 | Temp 98.2°F | Ht 68.0 in | Wt 139.0 lb

## 2023-03-13 DIAGNOSIS — I611 Nontraumatic intracerebral hemorrhage in hemisphere, cortical: Secondary | ICD-10-CM

## 2023-03-13 DIAGNOSIS — E538 Deficiency of other specified B group vitamins: Secondary | ICD-10-CM | POA: Diagnosis not present

## 2023-03-13 DIAGNOSIS — R410 Disorientation, unspecified: Secondary | ICD-10-CM

## 2023-03-13 DIAGNOSIS — E559 Vitamin D deficiency, unspecified: Secondary | ICD-10-CM | POA: Diagnosis not present

## 2023-03-13 DIAGNOSIS — I69118 Other symptoms and signs involving cognitive functions following nontraumatic intracerebral hemorrhage: Secondary | ICD-10-CM | POA: Diagnosis not present

## 2023-03-13 DIAGNOSIS — E785 Hyperlipidemia, unspecified: Secondary | ICD-10-CM | POA: Diagnosis not present

## 2023-03-13 DIAGNOSIS — I6389 Other cerebral infarction: Secondary | ICD-10-CM

## 2023-03-13 NOTE — Assessment & Plan Note (Addendum)
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 

## 2023-03-13 NOTE — Assessment & Plan Note (Signed)
Cont on B12 ?

## 2023-03-13 NOTE — Progress Notes (Signed)
Subjective:  Patient ID: Eric [name], male    DOB: September 23, 1940  Age: 82 y.o. MRN: [id number]  CC: Hospitalization Follow-up   HPI HAWKINS SEAMAN presents for post-hospital stay - CVA L handed  Per hx: " Admit date:     02/22/2023  Discharge date: 02/24/23  Discharge Physician: Thad Ranger, MD     PCP: Tresa Garter, MD    Recommendations at discharge:    Per neurology, continue aspirin 81 mg daily, Plavix 75 mg daily, 43 weeks and then aspirin alone Continue Crestor 20 mg daily Cardiology consulted for 30-day event monitor as outpatient to rule out A-fib, will arrange   Discharge Diagnoses:        Acute left parietal occipital small infarct   ICH (intracerebral hemorrhage) (HCC) Active Problems:   Essential hypertension   COPD   BPH associated with nocturia   Hyperlipemia       Hospital Course: Patient is a 82 year old male with COPD, HTN, prior history of CVA in 2015, BPH presented with confusion.  He was noted to be more unsteady by his family, at baseline does have gait instability due to arthritis.  Per family, has been gradually getting worse.  On the morning of admission woke up got to the bathroom and could not figure out how to urinate.  He reported to the family that he could not see, brought to the ED.  CT head showed small right frontal SAH.   MRI brain showed small area of acute ischemia in the posterior left parietal lobe Neurology was consulted.   Assessment and Plan:   SAH/ICH (intracerebral hemorrhage) (HCC) -Patient had presented with altered mental status and worsening gait instability. CT head showed small right frontal SAH -Neuro surgery was consulted, recommended stroke work-up, CT head.  No neurosurgical interventions indicated at this time. -Repeat CT head this morning showed no hemorrhage or progression at the patient's acute left parietal cortex infarct, numerous chronic infarcts.     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  Bilateral carotid bifurcation atherosclerosis without hemodynamically significant stenosis. 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  The small PFO was also seen on 2D echo in 2014.  Discussed in detail with neurology, Dr. Roda Shutters, venous Dopplers were negative for any DVT.  Given patient's age and other comorbidities, recommended no further workup of ASD or shunting. -Cardiology consulted for 30-day event monitor, office will arrange -PT evaluation recommended CIR however patient's family requested home health PT, OT     Acute encephalopathy -Likely due to #1, #2 -Improving, close to baseline mental status, confounded by daughter at the bedside  -  PT OT recommended CIR however patient's family requested home health PT OT     Essential hypertension -BP stable     COPD -No acute wheezing     BPH associated with nocturia -Continue finasteride     Hyperlipemia -Lipid panel showed LDL 112, triglycerides 61, cholesterol 183, started Crestor 20 mg daily  SFJ      Full                                                        +---------+---------------+---------+-----------+----------+--------------+ FV Prox  Full           Yes      Yes                                 +---------+---------------+---------+-----------+----------+--------------+ FV Mid   Full           Yes      Yes                                 +---------+---------------+---------+-----------+----------+--------------+ FV DistalFull           Yes      Yes                                 +---------+---------------+---------+-----------+----------+--------------+ PFV      Full                                                        +---------+---------------+---------+-----------+----------+--------------+ POP      Full           Yes      Yes                                 +---------+---------------+---------+-----------+----------+--------------+ PTV      Full                                                        +---------+---------------+---------+-----------+----------+--------------+ PERO     Full                                                        +---------+---------------+---------+-----------+----------+--------------+     Summary: BILATERAL: - No evidence of deep vein thrombosis seen in the lower extremities, bilaterally. -No evidence of popliteal cyst, bilaterally.   *See table(s) above for measurements and observations. Electronically signed by Waverly Ferrari MD on 02/23/2023 at 3:57:45 PM.    Final    ECHOCARDIOGRAM COMPLETE  Result Date: 02/23/2023    ECHOCARDIOGRAM REPORT   Patient Name:   Eric [name] [facility] Date of Exam: 02/23/2023 Medical Rec #:  [id number]         Height:       68.0 in Accession #:     [id number]        Weight:       147.0 lb Date of Birth:  27-Feb-1941  SFJ      Full                                                        +---------+---------------+---------+-----------+----------+--------------+ FV Prox  Full           Yes      Yes                                 +---------+---------------+---------+-----------+----------+--------------+ FV Mid   Full           Yes      Yes                                 +---------+---------------+---------+-----------+----------+--------------+ FV DistalFull           Yes      Yes                                 +---------+---------------+---------+-----------+----------+--------------+ PFV      Full                                                        +---------+---------------+---------+-----------+----------+--------------+ POP      Full           Yes      Yes                                 +---------+---------------+---------+-----------+----------+--------------+ PTV      Full                                                        +---------+---------------+---------+-----------+----------+--------------+ PERO     Full                                                        +---------+---------------+---------+-----------+----------+--------------+     Summary: BILATERAL: - No evidence of deep vein thrombosis seen in the lower extremities, bilaterally. -No evidence of popliteal cyst, bilaterally.   *See table(s) above for measurements and observations. Electronically signed by Waverly Ferrari MD on 02/23/2023 at 3:57:45 PM.    Final    ECHOCARDIOGRAM COMPLETE  Result Date: 02/23/2023    ECHOCARDIOGRAM REPORT   Patient Name:   Eric [name] [facility] Date of Exam: 02/23/2023 Medical Rec #:  [id number]         Height:       68.0 in Accession #:     [id number]        Weight:       147.0 lb Date of Birth:  27-Feb-1941  Eyes:     Conjunctiva/sclera: Conjunctivae normal.     Pupils: Pupils are equal, round, and reactive to light.  Neck:     Thyroid: No thyromegaly.     Vascular: No JVD.  Cardiovascular:     Rate and Rhythm: Normal rate and regular rhythm.     Heart sounds: Normal heart sounds. No murmur heard.    No friction rub. No gallop.  Pulmonary:     Effort: Pulmonary effort is normal. No respiratory distress.     Breath sounds: Normal breath sounds. No wheezing or rales.  Chest:     Chest wall: No tenderness.  Abdominal:     General: Bowel sounds are normal. There is no distension.     Palpations: Abdomen is soft. There is no mass.     Tenderness: There is no abdominal tenderness. There is no guarding or rebound.  Musculoskeletal:        General: No tenderness. Normal range of motion.     Cervical back: Normal range of motion.  Lymphadenopathy:     Cervical: No cervical adenopathy.  Skin:    General: Skin is warm and dry.     Findings: No rash.  Neurological:      Mental Status: He is alert. He is disoriented.     Cranial Nerves: No cranial nerve deficit.     Motor: No abnormal muscle tone.     Coordination: Coordination abnormal.     Gait: Gait abnormal.     Deep Tendon Reflexes: Reflexes are normal and symmetric.  Psychiatric:        Mood and Affect: Mood normal.        Behavior: Behavior normal.        Thought Content: Thought content normal.        Judgment: Judgment normal.   R hemiparesis Flat affect Alert, cooperative ataxic  Lab Results  Component Value Date   WBC 12.3 (H) 02/23/2023   HGB 11.7 (L) 02/23/2023   HCT 36.3 (L) 02/23/2023   PLT 363 02/23/2023   GLUCOSE 106 (H) 02/23/2023   CHOL 183 02/23/2023   TRIG 61 02/23/2023   HDL 59 02/23/2023   LDLDIRECT 163.3 05/26/2008   LDLCALC 112 (H) 02/23/2023   ALT 15 02/23/2023   AST 16 02/23/2023   NA 137 02/23/2023   K 3.8 02/23/2023   CL 105 02/23/2023   CREATININE 0.85 02/23/2023   BUN 6 (L) 02/23/2023   CO2 21 (L) 02/23/2023   TSH 1.239 02/22/2023   PSA 0.42 06/26/2022   INR 1.1 02/22/2023   HGBA1C 5.8 (H) 02/23/2023    VAS [place] LOWER EXTREMITY VENOUS (DVT)  Result Date: 02/23/2023  Lower Venous DVT Study Patient Name:  ESTLE HUGULEY [facility]  Date of Exam:   02/23/2023 Medical Rec #: [id number]          Accession #:    [id number] Date of Birth: 02-Apr-1941          Patient Gender: M Patient Age:   9 years Exam Location:  [facility] Procedure:      VAS [place] LOWER EXTREMITY VENOUS (DVT) Referring Phys: Scheryl Marten XU --------------------------------------------------------------------------------  Indications: Stroke.  Limitations: Patient immobility. Comparison Study: No previous exams Performing Technologist: Jody Hill RVT, RDMS  Examination Guidelines: A complete evaluation includes B-mode imaging, spectral Doppler, color Doppler, and power Doppler as needed of all accessible portions of each vessel. Bilateral testing is considered an integral part of a complete examination.  Limited examinations for reoccurring indications  SFJ      Full                                                        +---------+---------------+---------+-----------+----------+--------------+ FV Prox  Full           Yes      Yes                                 +---------+---------------+---------+-----------+----------+--------------+ FV Mid   Full           Yes      Yes                                 +---------+---------------+---------+-----------+----------+--------------+ FV DistalFull           Yes      Yes                                 +---------+---------------+---------+-----------+----------+--------------+ PFV      Full                                                        +---------+---------------+---------+-----------+----------+--------------+ POP      Full           Yes      Yes                                 +---------+---------------+---------+-----------+----------+--------------+ PTV      Full                                                        +---------+---------------+---------+-----------+----------+--------------+ PERO     Full                                                        +---------+---------------+---------+-----------+----------+--------------+     Summary: BILATERAL: - No evidence of deep vein thrombosis seen in the lower extremities, bilaterally. -No evidence of popliteal cyst, bilaterally.   *See table(s) above for measurements and observations. Electronically signed by Waverly Ferrari MD on 02/23/2023 at 3:57:45 PM.    Final    ECHOCARDIOGRAM COMPLETE  Result Date: 02/23/2023    ECHOCARDIOGRAM REPORT   Patient Name:   Eric [name] [facility] Date of Exam: 02/23/2023 Medical Rec #:  [id number]         Height:       68.0 in Accession #:     [id number]        Weight:       147.0 lb Date of Birth:  27-Feb-1941  SFJ      Full                                                        +---------+---------------+---------+-----------+----------+--------------+ FV Prox  Full           Yes      Yes                                 +---------+---------------+---------+-----------+----------+--------------+ FV Mid   Full           Yes      Yes                                 +---------+---------------+---------+-----------+----------+--------------+ FV DistalFull           Yes      Yes                                 +---------+---------------+---------+-----------+----------+--------------+ PFV      Full                                                        +---------+---------------+---------+-----------+----------+--------------+ POP      Full           Yes      Yes                                 +---------+---------------+---------+-----------+----------+--------------+ PTV      Full                                                        +---------+---------------+---------+-----------+----------+--------------+ PERO     Full                                                        +---------+---------------+---------+-----------+----------+--------------+     Summary: BILATERAL: - No evidence of deep vein thrombosis seen in the lower extremities, bilaterally. -No evidence of popliteal cyst, bilaterally.   *See table(s) above for measurements and observations. Electronically signed by Waverly Ferrari MD on 02/23/2023 at 3:57:45 PM.    Final    ECHOCARDIOGRAM COMPLETE  Result Date: 02/23/2023    ECHOCARDIOGRAM REPORT   Patient Name:   Eric [name] [facility] Date of Exam: 02/23/2023 Medical Rec #:  [id number]         Height:       68.0 in Accession #:     [id number]        Weight:       147.0 lb Date of Birth:  27-Feb-1941  Eyes:     Conjunctiva/sclera: Conjunctivae normal.     Pupils: Pupils are equal, round, and reactive to light.  Neck:     Thyroid: No thyromegaly.     Vascular: No JVD.  Cardiovascular:     Rate and Rhythm: Normal rate and regular rhythm.     Heart sounds: Normal heart sounds. No murmur heard.    No friction rub. No gallop.  Pulmonary:     Effort: Pulmonary effort is normal. No respiratory distress.     Breath sounds: Normal breath sounds. No wheezing or rales.  Chest:     Chest wall: No tenderness.  Abdominal:     General: Bowel sounds are normal. There is no distension.     Palpations: Abdomen is soft. There is no mass.     Tenderness: There is no abdominal tenderness. There is no guarding or rebound.  Musculoskeletal:        General: No tenderness. Normal range of motion.     Cervical back: Normal range of motion.  Lymphadenopathy:     Cervical: No cervical adenopathy.  Skin:    General: Skin is warm and dry.     Findings: No rash.  Neurological:      Mental Status: He is alert. He is disoriented.     Cranial Nerves: No cranial nerve deficit.     Motor: No abnormal muscle tone.     Coordination: Coordination abnormal.     Gait: Gait abnormal.     Deep Tendon Reflexes: Reflexes are normal and symmetric.  Psychiatric:        Mood and Affect: Mood normal.        Behavior: Behavior normal.        Thought Content: Thought content normal.        Judgment: Judgment normal.   R hemiparesis Flat affect Alert, cooperative ataxic  Lab Results  Component Value Date   WBC 12.3 (H) 02/23/2023   HGB 11.7 (L) 02/23/2023   HCT 36.3 (L) 02/23/2023   PLT 363 02/23/2023   GLUCOSE 106 (H) 02/23/2023   CHOL 183 02/23/2023   TRIG 61 02/23/2023   HDL 59 02/23/2023   LDLDIRECT 163.3 05/26/2008   LDLCALC 112 (H) 02/23/2023   ALT 15 02/23/2023   AST 16 02/23/2023   NA 137 02/23/2023   K 3.8 02/23/2023   CL 105 02/23/2023   CREATININE 0.85 02/23/2023   BUN 6 (L) 02/23/2023   CO2 21 (L) 02/23/2023   TSH 1.239 02/22/2023   PSA 0.42 06/26/2022   INR 1.1 02/22/2023   HGBA1C 5.8 (H) 02/23/2023    VAS [place] LOWER EXTREMITY VENOUS (DVT)  Result Date: 02/23/2023  Lower Venous DVT Study Patient Name:  ESTLE HUGULEY [facility]  Date of Exam:   02/23/2023 Medical Rec #: [id number]          Accession #:    [id number] Date of Birth: 02-Apr-1941          Patient Gender: M Patient Age:   9 years Exam Location:  [facility] Procedure:      VAS [place] LOWER EXTREMITY VENOUS (DVT) Referring Phys: Scheryl Marten XU --------------------------------------------------------------------------------  Indications: Stroke.  Limitations: Patient immobility. Comparison Study: No previous exams Performing Technologist: Jody Hill RVT, RDMS  Examination Guidelines: A complete evaluation includes B-mode imaging, spectral Doppler, color Doppler, and power Doppler as needed of all accessible portions of each vessel. Bilateral testing is considered an integral part of a complete examination.  Limited examinations for reoccurring indications  Subjective:  Patient ID: Eric [name], male    DOB: September 23, 1940  Age: 82 y.o. MRN: [id number]  CC: Hospitalization Follow-up   HPI HAWKINS SEAMAN presents for post-hospital stay - CVA L handed  Per hx: " Admit date:     02/22/2023  Discharge date: 02/24/23  Discharge Physician: Thad Ranger, MD     PCP: Tresa Garter, MD    Recommendations at discharge:    Per neurology, continue aspirin 81 mg daily, Plavix 75 mg daily, 43 weeks and then aspirin alone Continue Crestor 20 mg daily Cardiology consulted for 30-day event monitor as outpatient to rule out A-fib, will arrange   Discharge Diagnoses:        Acute left parietal occipital small infarct   ICH (intracerebral hemorrhage) (HCC) Active Problems:   Essential hypertension   COPD   BPH associated with nocturia   Hyperlipemia       Hospital Course: Patient is a 82 year old male with COPD, HTN, prior history of CVA in 2015, BPH presented with confusion.  He was noted to be more unsteady by his family, at baseline does have gait instability due to arthritis.  Per family, has been gradually getting worse.  On the morning of admission woke up got to the bathroom and could not figure out how to urinate.  He reported to the family that he could not see, brought to the ED.  CT head showed small right frontal SAH.   MRI brain showed small area of acute ischemia in the posterior left parietal lobe Neurology was consulted.   Assessment and Plan:   SAH/ICH (intracerebral hemorrhage) (HCC) -Patient had presented with altered mental status and worsening gait instability. CT head showed small right frontal SAH -Neuro surgery was consulted, recommended stroke work-up, CT head.  No neurosurgical interventions indicated at this time. -Repeat CT head this morning showed no hemorrhage or progression at the patient's acute left parietal cortex infarct, numerous chronic infarcts.     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  Bilateral carotid bifurcation atherosclerosis without hemodynamically significant stenosis. 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  The small PFO was also seen on 2D echo in 2014.  Discussed in detail with neurology, Dr. Roda Shutters, venous Dopplers were negative for any DVT.  Given patient's age and other comorbidities, recommended no further workup of ASD or shunting. -Cardiology consulted for 30-day event monitor, office will arrange -PT evaluation recommended CIR however patient's family requested home health PT, OT     Acute encephalopathy -Likely due to #1, #2 -Improving, close to baseline mental status, confounded by daughter at the bedside  -  PT OT recommended CIR however patient's family requested home health PT OT     Essential hypertension -BP stable     COPD -No acute wheezing     BPH associated with nocturia -Continue finasteride     Hyperlipemia -Lipid panel showed LDL 112, triglycerides 61, cholesterol 183, started Crestor 20 mg daily  Subjective:  Patient ID: Eric [name], male    DOB: September 23, 1940  Age: 82 y.o. MRN: [id number]  CC: Hospitalization Follow-up   HPI HAWKINS SEAMAN presents for post-hospital stay - CVA L handed  Per hx: " Admit date:     02/22/2023  Discharge date: 02/24/23  Discharge Physician: Thad Ranger, MD     PCP: Tresa Garter, MD    Recommendations at discharge:    Per neurology, continue aspirin 81 mg daily, Plavix 75 mg daily, 43 weeks and then aspirin alone Continue Crestor 20 mg daily Cardiology consulted for 30-day event monitor as outpatient to rule out A-fib, will arrange   Discharge Diagnoses:        Acute left parietal occipital small infarct   ICH (intracerebral hemorrhage) (HCC) Active Problems:   Essential hypertension   COPD   BPH associated with nocturia   Hyperlipemia       Hospital Course: Patient is a 82 year old male with COPD, HTN, prior history of CVA in 2015, BPH presented with confusion.  He was noted to be more unsteady by his family, at baseline does have gait instability due to arthritis.  Per family, has been gradually getting worse.  On the morning of admission woke up got to the bathroom and could not figure out how to urinate.  He reported to the family that he could not see, brought to the ED.  CT head showed small right frontal SAH.   MRI brain showed small area of acute ischemia in the posterior left parietal lobe Neurology was consulted.   Assessment and Plan:   SAH/ICH (intracerebral hemorrhage) (HCC) -Patient had presented with altered mental status and worsening gait instability. CT head showed small right frontal SAH -Neuro surgery was consulted, recommended stroke work-up, CT head.  No neurosurgical interventions indicated at this time. -Repeat CT head this morning showed no hemorrhage or progression at the patient's acute left parietal cortex infarct, numerous chronic infarcts.     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  Bilateral carotid bifurcation atherosclerosis without hemodynamically significant stenosis. 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  The small PFO was also seen on 2D echo in 2014.  Discussed in detail with neurology, Dr. Roda Shutters, venous Dopplers were negative for any DVT.  Given patient's age and other comorbidities, recommended no further workup of ASD or shunting. -Cardiology consulted for 30-day event monitor, office will arrange -PT evaluation recommended CIR however patient's family requested home health PT, OT     Acute encephalopathy -Likely due to #1, #2 -Improving, close to baseline mental status, confounded by daughter at the bedside  -  PT OT recommended CIR however patient's family requested home health PT OT     Essential hypertension -BP stable     COPD -No acute wheezing     BPH associated with nocturia -Continue finasteride     Hyperlipemia -Lipid panel showed LDL 112, triglycerides 61, cholesterol 183, started Crestor 20 mg daily  SFJ      Full                                                        +---------+---------------+---------+-----------+----------+--------------+ FV Prox  Full           Yes      Yes                                 +---------+---------------+---------+-----------+----------+--------------+ FV Mid   Full           Yes      Yes                                 +---------+---------------+---------+-----------+----------+--------------+ FV DistalFull           Yes      Yes                                 +---------+---------------+---------+-----------+----------+--------------+ PFV      Full                                                        +---------+---------------+---------+-----------+----------+--------------+ POP      Full           Yes      Yes                                 +---------+---------------+---------+-----------+----------+--------------+ PTV      Full                                                        +---------+---------------+---------+-----------+----------+--------------+ PERO     Full                                                        +---------+---------------+---------+-----------+----------+--------------+     Summary: BILATERAL: - No evidence of deep vein thrombosis seen in the lower extremities, bilaterally. -No evidence of popliteal cyst, bilaterally.   *See table(s) above for measurements and observations. Electronically signed by Waverly Ferrari MD on 02/23/2023 at 3:57:45 PM.    Final    ECHOCARDIOGRAM COMPLETE  Result Date: 02/23/2023    ECHOCARDIOGRAM REPORT   Patient Name:   Eric [name] [facility] Date of Exam: 02/23/2023 Medical Rec #:  [id number]         Height:       68.0 in Accession #:     [id number]        Weight:       147.0 lb Date of Birth:  27-Feb-1941

## 2023-03-13 NOTE — Assessment & Plan Note (Addendum)
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

## 2023-03-13 NOTE — Assessment & Plan Note (Signed)
On Vit D 

## 2023-03-13 NOTE — Assessment & Plan Note (Signed)
Post-CVA. Better 

## 2023-03-13 NOTE — Assessment & Plan Note (Signed)
On Crestor 

## 2023-03-14 DIAGNOSIS — H269 Unspecified cataract: Secondary | ICD-10-CM | POA: Diagnosis not present

## 2023-03-14 DIAGNOSIS — G9340 Encephalopathy, unspecified: Secondary | ICD-10-CM | POA: Diagnosis not present

## 2023-03-14 DIAGNOSIS — I69354 Hemiplegia and hemiparesis following cerebral infarction affecting left non-dominant side: Secondary | ICD-10-CM | POA: Diagnosis not present

## 2023-03-14 DIAGNOSIS — I6523 Occlusion and stenosis of bilateral carotid arteries: Secondary | ICD-10-CM | POA: Diagnosis not present

## 2023-03-14 DIAGNOSIS — I110 Hypertensive heart disease with heart failure: Secondary | ICD-10-CM | POA: Diagnosis not present

## 2023-03-14 DIAGNOSIS — Z7982 Long term (current) use of aspirin: Secondary | ICD-10-CM | POA: Diagnosis not present

## 2023-03-14 DIAGNOSIS — H5347 Heteronymous bilateral field defects: Secondary | ICD-10-CM | POA: Diagnosis not present

## 2023-03-14 DIAGNOSIS — I509 Heart failure, unspecified: Secondary | ICD-10-CM | POA: Diagnosis not present

## 2023-03-14 DIAGNOSIS — E785 Hyperlipidemia, unspecified: Secondary | ICD-10-CM | POA: Diagnosis not present

## 2023-03-14 DIAGNOSIS — H547 Unspecified visual loss: Secondary | ICD-10-CM | POA: Diagnosis not present

## 2023-03-14 DIAGNOSIS — M1990 Unspecified osteoarthritis, unspecified site: Secondary | ICD-10-CM | POA: Diagnosis not present

## 2023-03-14 DIAGNOSIS — Z556 Problems related to health literacy: Secondary | ICD-10-CM | POA: Diagnosis not present

## 2023-03-14 DIAGNOSIS — D72829 Elevated white blood cell count, unspecified: Secondary | ICD-10-CM | POA: Diagnosis not present

## 2023-03-14 DIAGNOSIS — Z7902 Long term (current) use of antithrombotics/antiplatelets: Secondary | ICD-10-CM | POA: Diagnosis not present

## 2023-03-14 DIAGNOSIS — I69319 Unspecified symptoms and signs involving cognitive functions following cerebral infarction: Secondary | ICD-10-CM | POA: Diagnosis not present

## 2023-03-14 DIAGNOSIS — I69398 Other sequelae of cerebral infarction: Secondary | ICD-10-CM | POA: Diagnosis not present

## 2023-03-14 DIAGNOSIS — J449 Chronic obstructive pulmonary disease, unspecified: Secondary | ICD-10-CM | POA: Diagnosis not present

## 2023-03-17 DIAGNOSIS — I69319 Unspecified symptoms and signs involving cognitive functions following cerebral infarction: Secondary | ICD-10-CM | POA: Diagnosis not present

## 2023-03-17 DIAGNOSIS — Z7982 Long term (current) use of aspirin: Secondary | ICD-10-CM | POA: Diagnosis not present

## 2023-03-17 DIAGNOSIS — Z7902 Long term (current) use of antithrombotics/antiplatelets: Secondary | ICD-10-CM | POA: Diagnosis not present

## 2023-03-17 DIAGNOSIS — E785 Hyperlipidemia, unspecified: Secondary | ICD-10-CM | POA: Diagnosis not present

## 2023-03-17 DIAGNOSIS — I6523 Occlusion and stenosis of bilateral carotid arteries: Secondary | ICD-10-CM | POA: Diagnosis not present

## 2023-03-17 DIAGNOSIS — H269 Unspecified cataract: Secondary | ICD-10-CM | POA: Diagnosis not present

## 2023-03-17 DIAGNOSIS — H5347 Heteronymous bilateral field defects: Secondary | ICD-10-CM | POA: Diagnosis not present

## 2023-03-17 DIAGNOSIS — D72829 Elevated white blood cell count, unspecified: Secondary | ICD-10-CM | POA: Diagnosis not present

## 2023-03-17 DIAGNOSIS — H547 Unspecified visual loss: Secondary | ICD-10-CM | POA: Diagnosis not present

## 2023-03-17 DIAGNOSIS — M1990 Unspecified osteoarthritis, unspecified site: Secondary | ICD-10-CM | POA: Diagnosis not present

## 2023-03-17 DIAGNOSIS — I110 Hypertensive heart disease with heart failure: Secondary | ICD-10-CM | POA: Diagnosis not present

## 2023-03-17 DIAGNOSIS — Z556 Problems related to health literacy: Secondary | ICD-10-CM | POA: Diagnosis not present

## 2023-03-17 DIAGNOSIS — I69354 Hemiplegia and hemiparesis following cerebral infarction affecting left non-dominant side: Secondary | ICD-10-CM | POA: Diagnosis not present

## 2023-03-17 DIAGNOSIS — I509 Heart failure, unspecified: Secondary | ICD-10-CM | POA: Diagnosis not present

## 2023-03-17 DIAGNOSIS — I69398 Other sequelae of cerebral infarction: Secondary | ICD-10-CM | POA: Diagnosis not present

## 2023-03-17 DIAGNOSIS — G9340 Encephalopathy, unspecified: Secondary | ICD-10-CM | POA: Diagnosis not present

## 2023-03-17 DIAGNOSIS — J449 Chronic obstructive pulmonary disease, unspecified: Secondary | ICD-10-CM | POA: Diagnosis not present

## 2023-03-19 ENCOUNTER — Ambulatory Visit: Payer: Self-pay

## 2023-03-19 DIAGNOSIS — D72829 Elevated white blood cell count, unspecified: Secondary | ICD-10-CM | POA: Diagnosis not present

## 2023-03-19 DIAGNOSIS — I509 Heart failure, unspecified: Secondary | ICD-10-CM | POA: Diagnosis not present

## 2023-03-19 DIAGNOSIS — G9340 Encephalopathy, unspecified: Secondary | ICD-10-CM | POA: Diagnosis not present

## 2023-03-19 DIAGNOSIS — Z7902 Long term (current) use of antithrombotics/antiplatelets: Secondary | ICD-10-CM | POA: Diagnosis not present

## 2023-03-19 DIAGNOSIS — H269 Unspecified cataract: Secondary | ICD-10-CM | POA: Diagnosis not present

## 2023-03-19 DIAGNOSIS — I6523 Occlusion and stenosis of bilateral carotid arteries: Secondary | ICD-10-CM | POA: Diagnosis not present

## 2023-03-19 DIAGNOSIS — Z7982 Long term (current) use of aspirin: Secondary | ICD-10-CM | POA: Diagnosis not present

## 2023-03-19 DIAGNOSIS — Z556 Problems related to health literacy: Secondary | ICD-10-CM | POA: Diagnosis not present

## 2023-03-19 DIAGNOSIS — J449 Chronic obstructive pulmonary disease, unspecified: Secondary | ICD-10-CM | POA: Diagnosis not present

## 2023-03-19 DIAGNOSIS — I69398 Other sequelae of cerebral infarction: Secondary | ICD-10-CM | POA: Diagnosis not present

## 2023-03-19 DIAGNOSIS — I110 Hypertensive heart disease with heart failure: Secondary | ICD-10-CM | POA: Diagnosis not present

## 2023-03-19 DIAGNOSIS — I69319 Unspecified symptoms and signs involving cognitive functions following cerebral infarction: Secondary | ICD-10-CM | POA: Diagnosis not present

## 2023-03-19 DIAGNOSIS — I69354 Hemiplegia and hemiparesis following cerebral infarction affecting left non-dominant side: Secondary | ICD-10-CM | POA: Diagnosis not present

## 2023-03-19 DIAGNOSIS — M1990 Unspecified osteoarthritis, unspecified site: Secondary | ICD-10-CM | POA: Diagnosis not present

## 2023-03-19 DIAGNOSIS — E785 Hyperlipidemia, unspecified: Secondary | ICD-10-CM | POA: Diagnosis not present

## 2023-03-19 DIAGNOSIS — H547 Unspecified visual loss: Secondary | ICD-10-CM | POA: Diagnosis not present

## 2023-03-19 DIAGNOSIS — H5347 Heteronymous bilateral field defects: Secondary | ICD-10-CM | POA: Diagnosis not present

## 2023-03-19 NOTE — Patient Outreach (Signed)
Care Coordination   Initial Visit Note   03/19/2023 Name: Eric [name] MRN: [id number] DOB: 1940/12/24  Eric [name] is a 82 y.o. year old male who sees Plotnikov, Georgina Quint, MD for primary care. I spoke with daughter(dpr) Eric [name], by phone today.  What matters to the patients health and wellness today?  Spoke with daughter Eric [name]. She states patient is improving. She states his memory is better, he is working with home health therapist as recommended. Last office visit with PCP 03/13/23. She is without questions or concerns at this time.  Goals Addressed             This Visit's Progress    Care coordination-continue to improve post hospitalization       Interventions Today    Flowsheet Row Most Recent Value  Chronic Disease   Chronic disease during today's visit Other  General Interventions   General Interventions Discussed/Reviewed General Interventions Discussed, Doctor Visits  Doctor Visits Discussed/Reviewed Doctor Visits Discussed, Doctor Visits Reviewed  PCP/Specialist Visits Compliance with follow-up visit  [reviewed upcoming scheduled appointments]  Exercise Interventions   Exercise Discussed/Reviewed Exercise Discussed  [confirmed active with home health]  Education Interventions   Provided Verbal Education On Medication, Exercise, When to see the doctor  Pharmacy Interventions   Pharmacy Dicussed/Reviewed Pharmacy Topics Discussed, Medications and their functions, Medication Adherence, Affording Medications  [medications reviewed with daughter assessed if any medication managment needs.]  Safety Interventions   Safety Discussed/Reviewed Safety Discussed  [assess if any falls since discharge, assessed mobility status]            SDOH assessments and interventions completed:  Yes  Care Coordination Interventions:  Yes, provided   Follow up plan: Follow up call scheduled for 04/18/23    Encounter Outcome:  Pt. Visit Completed   Eric Sheriff, RN, MSN, BSN, CCM [facility] Care Coordinator [phone]

## 2023-03-19 NOTE — Patient Instructions (Signed)
Visit Information  Thank you for taking time to visit with me today. Please don't hesitate to contact me if I can be of assistance to you.   Following are the goals we discussed today:  Continue to attend provider visits as scheduled Continue to take medications as recommended Contact provider if health questions or concerns as needed   Our next appointment is by telephone on 04/18/23 at 1:00 pm  Please call the care guide team at [phone] if you need to cancel or reschedule your appointment.   If you are experiencing a Mental Health or Behavioral Health Crisis or need someone to talk to, please call the Suicide and Crisis Lifeline: 35  Kathyrn Sheriff, RN, MSN, BSN, CCM [facility] Care Coordinator [phone]

## 2023-03-21 DIAGNOSIS — H269 Unspecified cataract: Secondary | ICD-10-CM | POA: Diagnosis not present

## 2023-03-21 DIAGNOSIS — E785 Hyperlipidemia, unspecified: Secondary | ICD-10-CM | POA: Diagnosis not present

## 2023-03-21 DIAGNOSIS — I69354 Hemiplegia and hemiparesis following cerebral infarction affecting left non-dominant side: Secondary | ICD-10-CM | POA: Diagnosis not present

## 2023-03-21 DIAGNOSIS — I69319 Unspecified symptoms and signs involving cognitive functions following cerebral infarction: Secondary | ICD-10-CM | POA: Diagnosis not present

## 2023-03-21 DIAGNOSIS — Z7902 Long term (current) use of antithrombotics/antiplatelets: Secondary | ICD-10-CM | POA: Diagnosis not present

## 2023-03-21 DIAGNOSIS — Z7982 Long term (current) use of aspirin: Secondary | ICD-10-CM | POA: Diagnosis not present

## 2023-03-21 DIAGNOSIS — Z556 Problems related to health literacy: Secondary | ICD-10-CM | POA: Diagnosis not present

## 2023-03-21 DIAGNOSIS — I69398 Other sequelae of cerebral infarction: Secondary | ICD-10-CM | POA: Diagnosis not present

## 2023-03-21 DIAGNOSIS — J449 Chronic obstructive pulmonary disease, unspecified: Secondary | ICD-10-CM | POA: Diagnosis not present

## 2023-03-21 DIAGNOSIS — I110 Hypertensive heart disease with heart failure: Secondary | ICD-10-CM | POA: Diagnosis not present

## 2023-03-21 DIAGNOSIS — G9340 Encephalopathy, unspecified: Secondary | ICD-10-CM | POA: Diagnosis not present

## 2023-03-21 DIAGNOSIS — M1990 Unspecified osteoarthritis, unspecified site: Secondary | ICD-10-CM | POA: Diagnosis not present

## 2023-03-21 DIAGNOSIS — H5347 Heteronymous bilateral field defects: Secondary | ICD-10-CM | POA: Diagnosis not present

## 2023-03-21 DIAGNOSIS — I509 Heart failure, unspecified: Secondary | ICD-10-CM | POA: Diagnosis not present

## 2023-03-21 DIAGNOSIS — H547 Unspecified visual loss: Secondary | ICD-10-CM | POA: Diagnosis not present

## 2023-03-21 DIAGNOSIS — D72829 Elevated white blood cell count, unspecified: Secondary | ICD-10-CM | POA: Diagnosis not present

## 2023-03-21 DIAGNOSIS — I6523 Occlusion and stenosis of bilateral carotid arteries: Secondary | ICD-10-CM | POA: Diagnosis not present

## 2023-03-24 DIAGNOSIS — H5347 Heteronymous bilateral field defects: Secondary | ICD-10-CM | POA: Diagnosis not present

## 2023-03-24 DIAGNOSIS — I69398 Other sequelae of cerebral infarction: Secondary | ICD-10-CM | POA: Diagnosis not present

## 2023-03-24 DIAGNOSIS — I509 Heart failure, unspecified: Secondary | ICD-10-CM | POA: Diagnosis not present

## 2023-03-24 DIAGNOSIS — Z7902 Long term (current) use of antithrombotics/antiplatelets: Secondary | ICD-10-CM | POA: Diagnosis not present

## 2023-03-24 DIAGNOSIS — H547 Unspecified visual loss: Secondary | ICD-10-CM | POA: Diagnosis not present

## 2023-03-24 DIAGNOSIS — H269 Unspecified cataract: Secondary | ICD-10-CM | POA: Diagnosis not present

## 2023-03-24 DIAGNOSIS — I69354 Hemiplegia and hemiparesis following cerebral infarction affecting left non-dominant side: Secondary | ICD-10-CM | POA: Diagnosis not present

## 2023-03-24 DIAGNOSIS — J449 Chronic obstructive pulmonary disease, unspecified: Secondary | ICD-10-CM | POA: Diagnosis not present

## 2023-03-24 DIAGNOSIS — G9340 Encephalopathy, unspecified: Secondary | ICD-10-CM | POA: Diagnosis not present

## 2023-03-24 DIAGNOSIS — E785 Hyperlipidemia, unspecified: Secondary | ICD-10-CM | POA: Diagnosis not present

## 2023-03-24 DIAGNOSIS — Z7982 Long term (current) use of aspirin: Secondary | ICD-10-CM | POA: Diagnosis not present

## 2023-03-24 DIAGNOSIS — Z556 Problems related to health literacy: Secondary | ICD-10-CM | POA: Diagnosis not present

## 2023-03-24 DIAGNOSIS — I69319 Unspecified symptoms and signs involving cognitive functions following cerebral infarction: Secondary | ICD-10-CM | POA: Diagnosis not present

## 2023-03-24 DIAGNOSIS — I110 Hypertensive heart disease with heart failure: Secondary | ICD-10-CM | POA: Diagnosis not present

## 2023-03-24 DIAGNOSIS — D72829 Elevated white blood cell count, unspecified: Secondary | ICD-10-CM | POA: Diagnosis not present

## 2023-03-24 DIAGNOSIS — I6523 Occlusion and stenosis of bilateral carotid arteries: Secondary | ICD-10-CM | POA: Diagnosis not present

## 2023-03-24 DIAGNOSIS — M1990 Unspecified osteoarthritis, unspecified site: Secondary | ICD-10-CM | POA: Diagnosis not present

## 2023-03-28 DIAGNOSIS — I69398 Other sequelae of cerebral infarction: Secondary | ICD-10-CM | POA: Diagnosis not present

## 2023-03-28 DIAGNOSIS — Z556 Problems related to health literacy: Secondary | ICD-10-CM | POA: Diagnosis not present

## 2023-03-28 DIAGNOSIS — E785 Hyperlipidemia, unspecified: Secondary | ICD-10-CM | POA: Diagnosis not present

## 2023-03-28 DIAGNOSIS — I69319 Unspecified symptoms and signs involving cognitive functions following cerebral infarction: Secondary | ICD-10-CM | POA: Diagnosis not present

## 2023-03-28 DIAGNOSIS — I6523 Occlusion and stenosis of bilateral carotid arteries: Secondary | ICD-10-CM | POA: Diagnosis not present

## 2023-03-28 DIAGNOSIS — I509 Heart failure, unspecified: Secondary | ICD-10-CM | POA: Diagnosis not present

## 2023-03-28 DIAGNOSIS — Z7982 Long term (current) use of aspirin: Secondary | ICD-10-CM | POA: Diagnosis not present

## 2023-03-28 DIAGNOSIS — M1990 Unspecified osteoarthritis, unspecified site: Secondary | ICD-10-CM | POA: Diagnosis not present

## 2023-03-28 DIAGNOSIS — H547 Unspecified visual loss: Secondary | ICD-10-CM | POA: Diagnosis not present

## 2023-03-28 DIAGNOSIS — Z7902 Long term (current) use of antithrombotics/antiplatelets: Secondary | ICD-10-CM | POA: Diagnosis not present

## 2023-03-28 DIAGNOSIS — I69354 Hemiplegia and hemiparesis following cerebral infarction affecting left non-dominant side: Secondary | ICD-10-CM | POA: Diagnosis not present

## 2023-03-28 DIAGNOSIS — G9340 Encephalopathy, unspecified: Secondary | ICD-10-CM | POA: Diagnosis not present

## 2023-03-28 DIAGNOSIS — D72829 Elevated white blood cell count, unspecified: Secondary | ICD-10-CM | POA: Diagnosis not present

## 2023-03-28 DIAGNOSIS — J449 Chronic obstructive pulmonary disease, unspecified: Secondary | ICD-10-CM | POA: Diagnosis not present

## 2023-03-28 DIAGNOSIS — H269 Unspecified cataract: Secondary | ICD-10-CM | POA: Diagnosis not present

## 2023-03-28 DIAGNOSIS — H5347 Heteronymous bilateral field defects: Secondary | ICD-10-CM | POA: Diagnosis not present

## 2023-03-28 DIAGNOSIS — I110 Hypertensive heart disease with heart failure: Secondary | ICD-10-CM | POA: Diagnosis not present

## 2023-03-31 DIAGNOSIS — Z556 Problems related to health literacy: Secondary | ICD-10-CM

## 2023-03-31 DIAGNOSIS — J449 Chronic obstructive pulmonary disease, unspecified: Secondary | ICD-10-CM | POA: Diagnosis not present

## 2023-03-31 DIAGNOSIS — M1990 Unspecified osteoarthritis, unspecified site: Secondary | ICD-10-CM

## 2023-03-31 DIAGNOSIS — I69319 Unspecified symptoms and signs involving cognitive functions following cerebral infarction: Secondary | ICD-10-CM | POA: Diagnosis not present

## 2023-03-31 DIAGNOSIS — Z7982 Long term (current) use of aspirin: Secondary | ICD-10-CM

## 2023-03-31 DIAGNOSIS — I69398 Other sequelae of cerebral infarction: Secondary | ICD-10-CM | POA: Diagnosis not present

## 2023-03-31 DIAGNOSIS — I509 Heart failure, unspecified: Secondary | ICD-10-CM | POA: Diagnosis not present

## 2023-03-31 DIAGNOSIS — I110 Hypertensive heart disease with heart failure: Secondary | ICD-10-CM | POA: Diagnosis not present

## 2023-03-31 DIAGNOSIS — H269 Unspecified cataract: Secondary | ICD-10-CM | POA: Diagnosis not present

## 2023-03-31 DIAGNOSIS — R351 Nocturia: Secondary | ICD-10-CM

## 2023-03-31 DIAGNOSIS — N401 Enlarged prostate with lower urinary tract symptoms: Secondary | ICD-10-CM

## 2023-03-31 DIAGNOSIS — H547 Unspecified visual loss: Secondary | ICD-10-CM | POA: Diagnosis not present

## 2023-03-31 DIAGNOSIS — Z7902 Long term (current) use of antithrombotics/antiplatelets: Secondary | ICD-10-CM

## 2023-03-31 DIAGNOSIS — N529 Male erectile dysfunction, unspecified: Secondary | ICD-10-CM

## 2023-03-31 DIAGNOSIS — I6523 Occlusion and stenosis of bilateral carotid arteries: Secondary | ICD-10-CM

## 2023-03-31 DIAGNOSIS — E785 Hyperlipidemia, unspecified: Secondary | ICD-10-CM

## 2023-03-31 DIAGNOSIS — H5347 Heteronymous bilateral field defects: Secondary | ICD-10-CM | POA: Diagnosis not present

## 2023-03-31 DIAGNOSIS — D72829 Elevated white blood cell count, unspecified: Secondary | ICD-10-CM

## 2023-03-31 DIAGNOSIS — G9340 Encephalopathy, unspecified: Secondary | ICD-10-CM | POA: Diagnosis not present

## 2023-03-31 DIAGNOSIS — I69354 Hemiplegia and hemiparesis following cerebral infarction affecting left non-dominant side: Secondary | ICD-10-CM | POA: Diagnosis not present

## 2023-04-01 ENCOUNTER — Ambulatory Visit: Payer: Medicare Other | Attending: Internal Medicine

## 2023-04-01 DIAGNOSIS — R9431 Abnormal electrocardiogram [ECG] [EKG]: Secondary | ICD-10-CM

## 2023-04-01 DIAGNOSIS — I499 Cardiac arrhythmia, unspecified: Secondary | ICD-10-CM

## 2023-04-01 DIAGNOSIS — I639 Cerebral infarction, unspecified: Secondary | ICD-10-CM

## 2023-04-03 DIAGNOSIS — H547 Unspecified visual loss: Secondary | ICD-10-CM | POA: Diagnosis not present

## 2023-04-03 DIAGNOSIS — Z7902 Long term (current) use of antithrombotics/antiplatelets: Secondary | ICD-10-CM | POA: Diagnosis not present

## 2023-04-03 DIAGNOSIS — M1990 Unspecified osteoarthritis, unspecified site: Secondary | ICD-10-CM | POA: Diagnosis not present

## 2023-04-03 DIAGNOSIS — E785 Hyperlipidemia, unspecified: Secondary | ICD-10-CM | POA: Diagnosis not present

## 2023-04-03 DIAGNOSIS — I509 Heart failure, unspecified: Secondary | ICD-10-CM | POA: Diagnosis not present

## 2023-04-03 DIAGNOSIS — Z7982 Long term (current) use of aspirin: Secondary | ICD-10-CM | POA: Diagnosis not present

## 2023-04-03 DIAGNOSIS — Z556 Problems related to health literacy: Secondary | ICD-10-CM | POA: Diagnosis not present

## 2023-04-03 DIAGNOSIS — I69354 Hemiplegia and hemiparesis following cerebral infarction affecting left non-dominant side: Secondary | ICD-10-CM | POA: Diagnosis not present

## 2023-04-03 DIAGNOSIS — I69319 Unspecified symptoms and signs involving cognitive functions following cerebral infarction: Secondary | ICD-10-CM | POA: Diagnosis not present

## 2023-04-03 DIAGNOSIS — G9340 Encephalopathy, unspecified: Secondary | ICD-10-CM | POA: Diagnosis not present

## 2023-04-03 DIAGNOSIS — I6523 Occlusion and stenosis of bilateral carotid arteries: Secondary | ICD-10-CM | POA: Diagnosis not present

## 2023-04-03 DIAGNOSIS — H5347 Heteronymous bilateral field defects: Secondary | ICD-10-CM | POA: Diagnosis not present

## 2023-04-03 DIAGNOSIS — I69398 Other sequelae of cerebral infarction: Secondary | ICD-10-CM | POA: Diagnosis not present

## 2023-04-03 DIAGNOSIS — D72829 Elevated white blood cell count, unspecified: Secondary | ICD-10-CM | POA: Diagnosis not present

## 2023-04-03 DIAGNOSIS — J449 Chronic obstructive pulmonary disease, unspecified: Secondary | ICD-10-CM | POA: Diagnosis not present

## 2023-04-03 DIAGNOSIS — H269 Unspecified cataract: Secondary | ICD-10-CM | POA: Diagnosis not present

## 2023-04-03 DIAGNOSIS — I110 Hypertensive heart disease with heart failure: Secondary | ICD-10-CM | POA: Diagnosis not present

## 2023-04-04 DIAGNOSIS — M1990 Unspecified osteoarthritis, unspecified site: Secondary | ICD-10-CM | POA: Diagnosis not present

## 2023-04-04 DIAGNOSIS — Z556 Problems related to health literacy: Secondary | ICD-10-CM | POA: Diagnosis not present

## 2023-04-04 DIAGNOSIS — E785 Hyperlipidemia, unspecified: Secondary | ICD-10-CM | POA: Diagnosis not present

## 2023-04-04 DIAGNOSIS — D72829 Elevated white blood cell count, unspecified: Secondary | ICD-10-CM | POA: Diagnosis not present

## 2023-04-04 DIAGNOSIS — Z7982 Long term (current) use of aspirin: Secondary | ICD-10-CM | POA: Diagnosis not present

## 2023-04-04 DIAGNOSIS — J449 Chronic obstructive pulmonary disease, unspecified: Secondary | ICD-10-CM | POA: Diagnosis not present

## 2023-04-04 DIAGNOSIS — Z7902 Long term (current) use of antithrombotics/antiplatelets: Secondary | ICD-10-CM | POA: Diagnosis not present

## 2023-04-04 DIAGNOSIS — I69398 Other sequelae of cerebral infarction: Secondary | ICD-10-CM | POA: Diagnosis not present

## 2023-04-04 DIAGNOSIS — I110 Hypertensive heart disease with heart failure: Secondary | ICD-10-CM | POA: Diagnosis not present

## 2023-04-04 DIAGNOSIS — H269 Unspecified cataract: Secondary | ICD-10-CM | POA: Diagnosis not present

## 2023-04-04 DIAGNOSIS — H5347 Heteronymous bilateral field defects: Secondary | ICD-10-CM | POA: Diagnosis not present

## 2023-04-04 DIAGNOSIS — I69354 Hemiplegia and hemiparesis following cerebral infarction affecting left non-dominant side: Secondary | ICD-10-CM | POA: Diagnosis not present

## 2023-04-04 DIAGNOSIS — H547 Unspecified visual loss: Secondary | ICD-10-CM | POA: Diagnosis not present

## 2023-04-04 DIAGNOSIS — I69319 Unspecified symptoms and signs involving cognitive functions following cerebral infarction: Secondary | ICD-10-CM | POA: Diagnosis not present

## 2023-04-04 DIAGNOSIS — I6523 Occlusion and stenosis of bilateral carotid arteries: Secondary | ICD-10-CM | POA: Diagnosis not present

## 2023-04-04 DIAGNOSIS — I509 Heart failure, unspecified: Secondary | ICD-10-CM | POA: Diagnosis not present

## 2023-04-04 DIAGNOSIS — G9340 Encephalopathy, unspecified: Secondary | ICD-10-CM | POA: Diagnosis not present

## 2023-04-08 ENCOUNTER — Encounter: Payer: Self-pay | Admitting: Internal Medicine

## 2023-04-09 DIAGNOSIS — I69354 Hemiplegia and hemiparesis following cerebral infarction affecting left non-dominant side: Secondary | ICD-10-CM | POA: Diagnosis not present

## 2023-04-09 DIAGNOSIS — I69319 Unspecified symptoms and signs involving cognitive functions following cerebral infarction: Secondary | ICD-10-CM | POA: Diagnosis not present

## 2023-04-09 DIAGNOSIS — Z7902 Long term (current) use of antithrombotics/antiplatelets: Secondary | ICD-10-CM | POA: Diagnosis not present

## 2023-04-09 DIAGNOSIS — D72829 Elevated white blood cell count, unspecified: Secondary | ICD-10-CM | POA: Diagnosis not present

## 2023-04-09 DIAGNOSIS — Z556 Problems related to health literacy: Secondary | ICD-10-CM | POA: Diagnosis not present

## 2023-04-09 DIAGNOSIS — J449 Chronic obstructive pulmonary disease, unspecified: Secondary | ICD-10-CM | POA: Diagnosis not present

## 2023-04-09 DIAGNOSIS — I69398 Other sequelae of cerebral infarction: Secondary | ICD-10-CM | POA: Diagnosis not present

## 2023-04-09 DIAGNOSIS — I6523 Occlusion and stenosis of bilateral carotid arteries: Secondary | ICD-10-CM | POA: Diagnosis not present

## 2023-04-09 DIAGNOSIS — H547 Unspecified visual loss: Secondary | ICD-10-CM | POA: Diagnosis not present

## 2023-04-09 DIAGNOSIS — H5347 Heteronymous bilateral field defects: Secondary | ICD-10-CM | POA: Diagnosis not present

## 2023-04-09 DIAGNOSIS — I509 Heart failure, unspecified: Secondary | ICD-10-CM | POA: Diagnosis not present

## 2023-04-09 DIAGNOSIS — I110 Hypertensive heart disease with heart failure: Secondary | ICD-10-CM | POA: Diagnosis not present

## 2023-04-09 DIAGNOSIS — H269 Unspecified cataract: Secondary | ICD-10-CM | POA: Diagnosis not present

## 2023-04-09 DIAGNOSIS — M1990 Unspecified osteoarthritis, unspecified site: Secondary | ICD-10-CM | POA: Diagnosis not present

## 2023-04-09 DIAGNOSIS — Z7982 Long term (current) use of aspirin: Secondary | ICD-10-CM | POA: Diagnosis not present

## 2023-04-09 DIAGNOSIS — G9340 Encephalopathy, unspecified: Secondary | ICD-10-CM | POA: Diagnosis not present

## 2023-04-09 DIAGNOSIS — E785 Hyperlipidemia, unspecified: Secondary | ICD-10-CM | POA: Diagnosis not present

## 2023-04-16 NOTE — Progress Notes (Unsigned)
[facility] [address] [place].  [address] [phone]       HOSPITAL FOLLOW UP NOTE  [name] Date of Birth:  1941/04/22 Medical Record Number:  [id number]   Reason for Referral:  hospital stroke follow up    SUBJECTIVE:   CHIEF COMPLAINT:  No chief complaint on file.   HPI:   [name] is a 82 y.o. male with history of hypertension, COPD, BPH, and hx of ICH who presented to ED on 02/22/2023 with confusion and vision change.  Stroke workup revealed left parietal occipital small infarct, embolic pattern of unclear etiology, concerning for cardioembolic source vs LVO.  Recommended 30-day cardiac event monitor outpatient to rule out A-fib.  2D echo reported evidence of atrial level shunting, possible PFO or ASD, ROPE score 3 meaning 0% chance current stroke related to PFO or ASD, no further workup or intervention recommended.  Initiated DAPT for 3 weeks then aspirin alone as well as Crestor 20 mg daily, LDL 112.  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  Therapies recommended discharge to CIR but family requested return home with home health therapy.        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   Hypertension    Stroke (HCC) 06/2014    PSH:  Past Surgical History:  Procedure Laterality Date   bilateral breast  removal     COLONOSCOPY  04/24/05    Social History:  Social History   Socioeconomic History   Marital status: Married    Spouse name: Nellie   Number of children: 6   Years of education: 10th   Highest education level: Not on file  Occupational History   Occupation: retired  Tobacco Use   Smoking status: Former    Current packs/day: 0.00    Types: Cigarettes    Quit date: 09/22/1999    Years since quitting: 23.5   Smokeless tobacco: Never  Substance and Sexual Activity   Alcohol use: No    Alcohol/week: 0.0 standard drinks of alcohol   Drug use: No   Sexual activity: Yes  Other Topics Concern   Not on file  Social History Narrative   Patient lives at home with wife, consumes no caffeine         Social Determinants of Health   Financial Resource Strain: Not on file  Food Insecurity: No Food Insecurity (02/26/2023)   Hunger Vital Sign    Worried About Running Out of Food in the Last Year: Never true    Ran Out of Food in the Last Year: Never true  Transportation Needs: No Transportation Needs (02/26/2023)   PRAPARE - Administrator, Civil Service (Medical): No    Lack of Transportation (Non-Medical): No  Physical Activity: Not on file  Stress: Not on file  Social Connections: Not on file  Intimate Partner Violence: Not At Risk (02/22/2023)   Humiliation, Afraid, Rape, and Kick questionnaire    Fear of Current or Ex-Partner: No    Emotionally Abused: No    Physically Abused: No    Sexually Abused: No    Family History:  Family History  Problem Relation Age of Onset   Arthritis Mother    Diabetes Father    Diabetes Other    Hypertension Other     Medications:   Current Outpatient Medications on File Prior to Visit  Medication Sig Dispense Refill   acetaminophen (TYLENOL) 650 MG CR tablet Take 650 mg by mouth at bedtime. For arthritis in back     amLODipine-benazepril (LOTREL) 5-20 MG capsule TAKE 1 CAPSULE BY MOUTH EVERY DAY (Patient taking  differently: Take 1 capsule by mouth daily.) 90 capsule 3   aspirin EC 81 MG tablet Take 1 tablet (81 mg total) by mouth daily. Swallow whole. 30 tablet 12   Cyanocobalamin (VITAMIN B-12) 1000 MCG SUBL Place 1 tablet (1,000 mcg total) under the tongue daily. 100 tablet 3   ferrous sulfate 325 (65 FE) MG tablet Take 1 tablet (325 mg total) by mouth 2 (two) times daily with a meal. (Patient taking differently: Take 325 mg by mouth daily with breakfast.) 100 tablet 2   finasteride (PROSCAR) 5 MG tablet Take 1 tablet (5 mg total) by mouth daily. 90 tablet 3   oxybutynin (DITROPAN-XL) 5 MG 24 hr tablet TAKE 1 TABLET (5 MG TOTAL) BY MOUTH AT BEDTIME. FOR FREQUENT URINATION 90 tablet 2   pantoprazole (PROTONIX) 40 MG tablet Take 1 tablet (40 mg total) by mouth daily. 30 tablet 3   rosuvastatin (CRESTOR) 20 MG tablet Take 1 tablet (20 mg total) by mouth daily. 30 tablet 4   traMADol (ULTRAM) 50 MG tablet TAKE 1 TABLET 3 TIMES A DAY AS NEEDED SEVERE PAIN (Patient taking differently: Take 50 mg by mouth 3 (three) times daily as needed for severe pain.) 90 tablet 3   No current facility-administered medications on file prior to visit.    Allergies:   Allergies  Allergen Reactions   Hctz [Hydrochlorothiazide]     Peeing too much      OBJECTIVE:  Physical Exam  There were no vitals filed for this visit. There is no height or weight on file to calculate BMI. No results found.     03/13/2023   10:46 AM  Depression screen PHQ 2/9  Decreased Interest 0  Down, Depressed, Hopeless 0  PHQ - 2 Score 0     General: well developed, well nourished, seated, in no evident distress Head: head normocephalic and atraumatic.   Neck: supple with no carotid or supraclavicular bruits Cardiovascular: regular rate and rhythm, no murmurs Musculoskeletal: no deformity Skin:  no rash/petichiae Vascular:  Normal pulses all extremities   Neurologic Exam Mental Status: Awake and fully alert. Oriented to place  and time. Recent and remote memory intact. Attention span, concentration and fund of knowledge appropriate. Mood and affect appropriate.  Cranial Nerves: Fundoscopic exam reveals sharp disc margins. Pupils equal, briskly reactive to light. Extraocular movements full without nystagmus. Visual fields full to confrontation. Hearing intact. Facial sensation intact. Face, tongue, palate moves normally and symmetrically.  Motor: Normal bulk and tone. Normal strength in all tested extremity muscles Sensory.: intact to touch , pinprick , position and vibratory sensation.  Coordination: Rapid alternating movements normal in all extremities. Finger-to-nose and heel-to-shin performed accurately bilaterally.  Gait and Station: Arises from chair without difficulty. Stance is normal. Gait demonstrates normal stride length and balance with ***. Tandem walk and heel toe ***.  Reflexes: 1+ and symmetric. Toes downgoing.     NIHSS  *** Modified Rankin  ***      ASSESSMENT: Eric [name] is a 82 y.o. year old male left parietal occipital small infarct on 02/22/2023 of unclear etiology. Vascular risk factors include hx of right temporoparietal ICH, prior hemic strokes on imaging, HTN, HLD and advanced age.      PLAN:  Left parietal occipital infarct:  Residual deficit: ***.  Continue aspirin 81mg  daily and rosuvastatin (Crestor) for secondary stroke prevention.   Discussed secondary stroke prevention measures and importance of close PCP follow up for aggressive stroke risk factor management including BP goal<130/90, HLD with LDL goal<70 and DM with A1c.<7 .  Stroke labs 02/2023: LDL 112, A1c 5.8 I have gone over the pathophysiology of stroke, warning signs and symptoms, risk factors and their management in some detail with instructions to go to the closest emergency room for symptoms of concern.     Follow up in *** or call earlier if needed   CC:  [facility] provider: Dr. Pearlean Brownie PCP: Plotnikov, Georgina Quint, MD    I spent *** minutes of face-to-face and non-face-to-face time with patient.  This included previsit chart review including review of recent hospitalization, lab review, study review, order entry, electronic health record documentation, patient education regarding recent stroke including etiology, secondary stroke prevention measures and importance of managing stroke risk factors, residual deficits and typical recovery time and answered all other questions to patient satisfaction   Ihor Austin, AGNP-BC  [facility] [address] [place], [place] [address]  Phone [phone] Fax [phone] Note: This document was prepared with digital dictation and possible smart phrase technology. Any transcriptional errors that result from this process are unintentional.

## 2023-04-17 ENCOUNTER — Encounter: Payer: Self-pay | Admitting: Internal Medicine

## 2023-04-17 ENCOUNTER — Encounter: Payer: Self-pay | Admitting: Adult Health

## 2023-04-17 ENCOUNTER — Ambulatory Visit (INDEPENDENT_AMBULATORY_CARE_PROVIDER_SITE_OTHER): Payer: Medicare Other | Admitting: Internal Medicine

## 2023-04-17 ENCOUNTER — Ambulatory Visit: Payer: Medicare Other | Admitting: Adult Health

## 2023-04-17 VITALS — BP 135/82 | HR 95 | Temp 97.8°F | Ht 68.0 in | Wt 134.4 lb

## 2023-04-17 VITALS — BP 131/80 | HR 85 | Ht 68.0 in | Wt 135.0 lb

## 2023-04-17 DIAGNOSIS — I639 Cerebral infarction, unspecified: Secondary | ICD-10-CM | POA: Diagnosis not present

## 2023-04-17 DIAGNOSIS — I6389 Other cerebral infarction: Secondary | ICD-10-CM | POA: Diagnosis not present

## 2023-04-17 DIAGNOSIS — E538 Deficiency of other specified B group vitamins: Secondary | ICD-10-CM

## 2023-04-17 DIAGNOSIS — I10 Essential (primary) hypertension: Secondary | ICD-10-CM | POA: Diagnosis not present

## 2023-04-17 DIAGNOSIS — I611 Nontraumatic intracerebral hemorrhage in hemisphere, cortical: Secondary | ICD-10-CM

## 2023-04-17 DIAGNOSIS — R410 Disorientation, unspecified: Secondary | ICD-10-CM

## 2023-04-17 DIAGNOSIS — D509 Iron deficiency anemia, unspecified: Secondary | ICD-10-CM | POA: Diagnosis not present

## 2023-04-17 LAB — COMPREHENSIVE METABOLIC PANEL
ALT: 17 U/L (ref 0–53)
AST: 15 U/L (ref 0–37)
Albumin: 4.3 g/dL (ref 3.5–5.2)
Alkaline Phosphatase: 60 U/L (ref 39–117)
BUN: 6 mg/dL (ref 6–23)
CO2: 28 mEq/L (ref 19–32)
Calcium: 10.1 mg/dL (ref 8.4–10.5)
Chloride: 101 mEq/L (ref 96–112)
Creatinine, Ser: 1.03 mg/dL (ref 0.40–1.50)
GFR: 67.71 mL/min (ref >60.00)
Glucose, Bld: 74 mg/dL (ref 70–99)
Potassium: 3.4 mEq/L — ABNORMAL LOW (ref 3.5–5.1)
Sodium: 136 mEq/L (ref 135–145)
Total Bilirubin: 0.5 mg/dL (ref 0.2–1.2)
Total Protein: 7.6 g/dL (ref 6.0–8.3)

## 2023-04-17 LAB — CBC WITH DIFFERENTIAL/PLATELET
Basophils Absolute: 0 10*3/uL (ref 0.0–0.1)
Basophils Relative: 0.5 % (ref 0.0–3.0)
Eosinophils Absolute: 0 10*3/uL (ref 0.0–0.7)
Eosinophils Relative: 0 % (ref 0.0–5.0)
HCT: 43.2 % (ref 39.0–52.0)
Hemoglobin: 13.5 g/dL (ref 13.0–17.0)
Lymphocytes Relative: 19.7 % (ref 12.0–46.0)
Lymphs Abs: 1.7 10*3/uL (ref 0.7–4.0)
MCHC: 31.3 g/dL (ref 30.0–36.0)
MCV: 82.7 fl (ref 78.0–100.0)
Monocytes Absolute: 0.4 10*3/uL (ref 0.1–1.0)
Monocytes Relative: 5 % (ref 3.0–12.0)
Neutro Abs: 6.4 10*3/uL (ref 1.4–7.7)
Neutrophils Relative %: 74.8 % (ref 43.0–77.0)
Platelets: 337 10*3/uL (ref 150.0–400.0)
RBC: 5.22 Mil/uL (ref 4.22–5.81)
RDW: 19.5 % — ABNORMAL HIGH (ref 11.5–15.5)
WBC: 8.6 10*3/uL (ref 4.0–10.5)

## 2023-04-17 NOTE — Assessment & Plan Note (Signed)
Cont on B12 ?

## 2023-04-17 NOTE — Assessment & Plan Note (Signed)
Cont on Lotrel BP nl now

## 2023-04-17 NOTE — Assessment & Plan Note (Signed)
Occasional - better now

## 2023-04-17 NOTE — Progress Notes (Signed)
Eyes:     Conjunctiva/sclera: Conjunctivae normal.     Pupils: Pupils are equal, round, and reactive to light.  Neck:     Thyroid: No thyromegaly.     Vascular: No JVD.  Cardiovascular:     Rate and Rhythm: Normal rate and regular rhythm.     Heart sounds: Normal heart sounds. No murmur heard.    No friction rub. No gallop.  Pulmonary:     Effort: Pulmonary effort is normal. No respiratory distress.     Breath sounds: Normal breath sounds. No wheezing or rales.  Chest:     Chest wall: No tenderness.  Abdominal:     General: Bowel sounds are normal. There is no distension.      Palpations: Abdomen is soft. There is no mass.     Tenderness: There is no abdominal tenderness. There is no guarding or rebound.  Musculoskeletal:        General: No tenderness. Normal range of motion.     Cervical back: Normal range of motion.  Lymphadenopathy:     Cervical: No cervical adenopathy.  Skin:    General: Skin is warm and dry.     Findings: No rash.  Neurological:     Mental Status: He is alert and oriented to person, place, and time.     Cranial Nerves: No cranial nerve deficit.     Motor: No abnormal muscle tone.     Coordination: Coordination normal.     Gait: Gait abnormal.     Deep Tendon Reflexes: Reflexes are normal and symmetric.  Psychiatric:        Behavior: Behavior normal.        Thought Content: Thought content normal.        Judgment: Judgment normal.    Using a cane   Lab Results  Component Value Date   WBC 12.3 (H) 02/23/2023   HGB 11.7 (L) 02/23/2023   HCT 36.3 (L) 02/23/2023   PLT 363 02/23/2023   GLUCOSE 106 (H) 02/23/2023   CHOL 183 02/23/2023   TRIG 61 02/23/2023   HDL 59 02/23/2023   LDLDIRECT 163.3 05/26/2008   LDLCALC 112 (H) 02/23/2023   ALT 15 02/23/2023   AST 16 02/23/2023   NA 137 02/23/2023   K 3.8 02/23/2023   CL 105 02/23/2023   CREATININE 0.85 02/23/2023   BUN 6 (L) 02/23/2023   CO2 21 (L) 02/23/2023   TSH 1.239 02/22/2023   PSA 0.42 06/26/2022   INR 1.1 02/22/2023   HGBA1C 5.8 (H) 02/23/2023    VAS [place] LOWER EXTREMITY VENOUS (DVT)  Result Date: 02/23/2023  Lower Venous DVT Study Patient Name:  Eric [name] [facility]  Date of Exam:   02/23/2023 Medical Rec #: [id number]          Accession #:    [id number] Date of Birth: 10-28-1940          Patient Gender: M Patient Age:   82 years Exam Location:  [facility] Procedure:      VAS [place] LOWER EXTREMITY VENOUS (DVT) Referring Phys: Scheryl Marten XU --------------------------------------------------------------------------------  Indications: Stroke.  Limitations: Patient  immobility. Comparison Study: No previous exams Performing Technologist: Jody Hill RVT, RDMS  Examination Guidelines: A complete evaluation includes B-mode imaging, spectral Doppler, color Doppler, and power Doppler as needed of all accessible portions of each vessel. Bilateral testing is considered an integral part of a complete examination. Limited examinations for reoccurring indications may be performed as noted. The reflux portion of the  SFJ      Full                                                        +---------+---------------+---------+-----------+----------+--------------+ FV Prox  Full           Yes      Yes                                 +---------+---------------+---------+-----------+----------+--------------+ FV Mid   Full           Yes      Yes                                 +---------+---------------+---------+-----------+----------+--------------+ FV DistalFull           Yes      Yes                                 +---------+---------------+---------+-----------+----------+--------------+ PFV      Full                                                        +---------+---------------+---------+-----------+----------+--------------+ POP      Full           Yes      Yes                                 +---------+---------------+---------+-----------+----------+--------------+ PTV      Full                                                        +---------+---------------+---------+-----------+----------+--------------+ PERO     Full                                                        +---------+---------------+---------+-----------+----------+--------------+     Summary: BILATERAL: - No evidence of deep vein thrombosis seen in the lower extremities, bilaterally. -No evidence of  popliteal cyst, bilaterally.   *See table(s) above for measurements and observations. Electronically signed by Waverly Ferrari MD on 02/23/2023 at 3:57:45 PM.    Final    ECHOCARDIOGRAM COMPLETE  Result Date: 02/23/2023    ECHOCARDIOGRAM REPORT   Patient Name:   Eric [name] [facility] Date of Exam: 02/23/2023 Medical Rec #:  [id number]         Height:       68.0 in Accession #:    [id number]        Weight:       147.0 lb Date of Birth:  10-Aug-1941  Eyes:     Conjunctiva/sclera: Conjunctivae normal.     Pupils: Pupils are equal, round, and reactive to light.  Neck:     Thyroid: No thyromegaly.     Vascular: No JVD.  Cardiovascular:     Rate and Rhythm: Normal rate and regular rhythm.     Heart sounds: Normal heart sounds. No murmur heard.    No friction rub. No gallop.  Pulmonary:     Effort: Pulmonary effort is normal. No respiratory distress.     Breath sounds: Normal breath sounds. No wheezing or rales.  Chest:     Chest wall: No tenderness.  Abdominal:     General: Bowel sounds are normal. There is no distension.      Palpations: Abdomen is soft. There is no mass.     Tenderness: There is no abdominal tenderness. There is no guarding or rebound.  Musculoskeletal:        General: No tenderness. Normal range of motion.     Cervical back: Normal range of motion.  Lymphadenopathy:     Cervical: No cervical adenopathy.  Skin:    General: Skin is warm and dry.     Findings: No rash.  Neurological:     Mental Status: He is alert and oriented to person, place, and time.     Cranial Nerves: No cranial nerve deficit.     Motor: No abnormal muscle tone.     Coordination: Coordination normal.     Gait: Gait abnormal.     Deep Tendon Reflexes: Reflexes are normal and symmetric.  Psychiatric:        Behavior: Behavior normal.        Thought Content: Thought content normal.        Judgment: Judgment normal.    Using a cane   Lab Results  Component Value Date   WBC 12.3 (H) 02/23/2023   HGB 11.7 (L) 02/23/2023   HCT 36.3 (L) 02/23/2023   PLT 363 02/23/2023   GLUCOSE 106 (H) 02/23/2023   CHOL 183 02/23/2023   TRIG 61 02/23/2023   HDL 59 02/23/2023   LDLDIRECT 163.3 05/26/2008   LDLCALC 112 (H) 02/23/2023   ALT 15 02/23/2023   AST 16 02/23/2023   NA 137 02/23/2023   K 3.8 02/23/2023   CL 105 02/23/2023   CREATININE 0.85 02/23/2023   BUN 6 (L) 02/23/2023   CO2 21 (L) 02/23/2023   TSH 1.239 02/22/2023   PSA 0.42 06/26/2022   INR 1.1 02/22/2023   HGBA1C 5.8 (H) 02/23/2023    VAS [place] LOWER EXTREMITY VENOUS (DVT)  Result Date: 02/23/2023  Lower Venous DVT Study Patient Name:  Eric [name] [facility]  Date of Exam:   02/23/2023 Medical Rec #: [id number]          Accession #:    [id number] Date of Birth: 10-28-1940          Patient Gender: M Patient Age:   82 years Exam Location:  [facility] Procedure:      VAS [place] LOWER EXTREMITY VENOUS (DVT) Referring Phys: Scheryl Marten XU --------------------------------------------------------------------------------  Indications: Stroke.  Limitations: Patient  immobility. Comparison Study: No previous exams Performing Technologist: Jody Hill RVT, RDMS  Examination Guidelines: A complete evaluation includes B-mode imaging, spectral Doppler, color Doppler, and power Doppler as needed of all accessible portions of each vessel. Bilateral testing is considered an integral part of a complete examination. Limited examinations for reoccurring indications may be performed as noted. The reflux portion of the  Eyes:     Conjunctiva/sclera: Conjunctivae normal.     Pupils: Pupils are equal, round, and reactive to light.  Neck:     Thyroid: No thyromegaly.     Vascular: No JVD.  Cardiovascular:     Rate and Rhythm: Normal rate and regular rhythm.     Heart sounds: Normal heart sounds. No murmur heard.    No friction rub. No gallop.  Pulmonary:     Effort: Pulmonary effort is normal. No respiratory distress.     Breath sounds: Normal breath sounds. No wheezing or rales.  Chest:     Chest wall: No tenderness.  Abdominal:     General: Bowel sounds are normal. There is no distension.      Palpations: Abdomen is soft. There is no mass.     Tenderness: There is no abdominal tenderness. There is no guarding or rebound.  Musculoskeletal:        General: No tenderness. Normal range of motion.     Cervical back: Normal range of motion.  Lymphadenopathy:     Cervical: No cervical adenopathy.  Skin:    General: Skin is warm and dry.     Findings: No rash.  Neurological:     Mental Status: He is alert and oriented to person, place, and time.     Cranial Nerves: No cranial nerve deficit.     Motor: No abnormal muscle tone.     Coordination: Coordination normal.     Gait: Gait abnormal.     Deep Tendon Reflexes: Reflexes are normal and symmetric.  Psychiatric:        Behavior: Behavior normal.        Thought Content: Thought content normal.        Judgment: Judgment normal.    Using a cane   Lab Results  Component Value Date   WBC 12.3 (H) 02/23/2023   HGB 11.7 (L) 02/23/2023   HCT 36.3 (L) 02/23/2023   PLT 363 02/23/2023   GLUCOSE 106 (H) 02/23/2023   CHOL 183 02/23/2023   TRIG 61 02/23/2023   HDL 59 02/23/2023   LDLDIRECT 163.3 05/26/2008   LDLCALC 112 (H) 02/23/2023   ALT 15 02/23/2023   AST 16 02/23/2023   NA 137 02/23/2023   K 3.8 02/23/2023   CL 105 02/23/2023   CREATININE 0.85 02/23/2023   BUN 6 (L) 02/23/2023   CO2 21 (L) 02/23/2023   TSH 1.239 02/22/2023   PSA 0.42 06/26/2022   INR 1.1 02/22/2023   HGBA1C 5.8 (H) 02/23/2023    VAS [place] LOWER EXTREMITY VENOUS (DVT)  Result Date: 02/23/2023  Lower Venous DVT Study Patient Name:  Eric [name] [facility]  Date of Exam:   02/23/2023 Medical Rec #: [id number]          Accession #:    [id number] Date of Birth: 10-28-1940          Patient Gender: M Patient Age:   82 years Exam Location:  [facility] Procedure:      VAS [place] LOWER EXTREMITY VENOUS (DVT) Referring Phys: Scheryl Marten XU --------------------------------------------------------------------------------  Indications: Stroke.  Limitations: Patient  immobility. Comparison Study: No previous exams Performing Technologist: Jody Hill RVT, RDMS  Examination Guidelines: A complete evaluation includes B-mode imaging, spectral Doppler, color Doppler, and power Doppler as needed of all accessible portions of each vessel. Bilateral testing is considered an integral part of a complete examination. Limited examinations for reoccurring indications may be performed as noted. The reflux portion of the  SFJ      Full                                                        +---------+---------------+---------+-----------+----------+--------------+ FV Prox  Full           Yes      Yes                                 +---------+---------------+---------+-----------+----------+--------------+ FV Mid   Full           Yes      Yes                                 +---------+---------------+---------+-----------+----------+--------------+ FV DistalFull           Yes      Yes                                 +---------+---------------+---------+-----------+----------+--------------+ PFV      Full                                                        +---------+---------------+---------+-----------+----------+--------------+ POP      Full           Yes      Yes                                 +---------+---------------+---------+-----------+----------+--------------+ PTV      Full                                                        +---------+---------------+---------+-----------+----------+--------------+ PERO     Full                                                        +---------+---------------+---------+-----------+----------+--------------+     Summary: BILATERAL: - No evidence of deep vein thrombosis seen in the lower extremities, bilaterally. -No evidence of  popliteal cyst, bilaterally.   *See table(s) above for measurements and observations. Electronically signed by Waverly Ferrari MD on 02/23/2023 at 3:57:45 PM.    Final    ECHOCARDIOGRAM COMPLETE  Result Date: 02/23/2023    ECHOCARDIOGRAM REPORT   Patient Name:   Eric [name] [facility] Date of Exam: 02/23/2023 Medical Rec #:  [id number]         Height:       68.0 in Accession #:    [id number]        Weight:       147.0 lb Date of Birth:  10-Aug-1941  Eyes:     Conjunctiva/sclera: Conjunctivae normal.     Pupils: Pupils are equal, round, and reactive to light.  Neck:     Thyroid: No thyromegaly.     Vascular: No JVD.  Cardiovascular:     Rate and Rhythm: Normal rate and regular rhythm.     Heart sounds: Normal heart sounds. No murmur heard.    No friction rub. No gallop.  Pulmonary:     Effort: Pulmonary effort is normal. No respiratory distress.     Breath sounds: Normal breath sounds. No wheezing or rales.  Chest:     Chest wall: No tenderness.  Abdominal:     General: Bowel sounds are normal. There is no distension.      Palpations: Abdomen is soft. There is no mass.     Tenderness: There is no abdominal tenderness. There is no guarding or rebound.  Musculoskeletal:        General: No tenderness. Normal range of motion.     Cervical back: Normal range of motion.  Lymphadenopathy:     Cervical: No cervical adenopathy.  Skin:    General: Skin is warm and dry.     Findings: No rash.  Neurological:     Mental Status: He is alert and oriented to person, place, and time.     Cranial Nerves: No cranial nerve deficit.     Motor: No abnormal muscle tone.     Coordination: Coordination normal.     Gait: Gait abnormal.     Deep Tendon Reflexes: Reflexes are normal and symmetric.  Psychiatric:        Behavior: Behavior normal.        Thought Content: Thought content normal.        Judgment: Judgment normal.    Using a cane   Lab Results  Component Value Date   WBC 12.3 (H) 02/23/2023   HGB 11.7 (L) 02/23/2023   HCT 36.3 (L) 02/23/2023   PLT 363 02/23/2023   GLUCOSE 106 (H) 02/23/2023   CHOL 183 02/23/2023   TRIG 61 02/23/2023   HDL 59 02/23/2023   LDLDIRECT 163.3 05/26/2008   LDLCALC 112 (H) 02/23/2023   ALT 15 02/23/2023   AST 16 02/23/2023   NA 137 02/23/2023   K 3.8 02/23/2023   CL 105 02/23/2023   CREATININE 0.85 02/23/2023   BUN 6 (L) 02/23/2023   CO2 21 (L) 02/23/2023   TSH 1.239 02/22/2023   PSA 0.42 06/26/2022   INR 1.1 02/22/2023   HGBA1C 5.8 (H) 02/23/2023    VAS [place] LOWER EXTREMITY VENOUS (DVT)  Result Date: 02/23/2023  Lower Venous DVT Study Patient Name:  Eric [name] [facility]  Date of Exam:   02/23/2023 Medical Rec #: [id number]          Accession #:    [id number] Date of Birth: 10-28-1940          Patient Gender: M Patient Age:   82 years Exam Location:  [facility] Procedure:      VAS [place] LOWER EXTREMITY VENOUS (DVT) Referring Phys: Scheryl Marten XU --------------------------------------------------------------------------------  Indications: Stroke.  Limitations: Patient  immobility. Comparison Study: No previous exams Performing Technologist: Jody Hill RVT, RDMS  Examination Guidelines: A complete evaluation includes B-mode imaging, spectral Doppler, color Doppler, and power Doppler as needed of all accessible portions of each vessel. Bilateral testing is considered an integral part of a complete examination. Limited examinations for reoccurring indications may be performed as noted. The reflux portion of the  Subjective:  Patient ID: Eric [name], male    DOB: 04/04/1941  Age: 82 y.o. MRN: [id number]  CC: Medical Management of Chronic Issues ( f/u appt, no concerns )   HPI Eric [name] presents for CVA, HTN. BPH   Outpatient Medications Prior to Visit  Medication Sig Dispense Refill   amLODipine-benazepril (LOTREL) 5-20 MG capsule TAKE 1 CAPSULE BY MOUTH EVERY DAY (Patient taking differently: Take 1 capsule by mouth daily.) 90 capsule 3   aspirin EC 81 MG tablet Take 1 tablet (81 mg total) by mouth daily. Swallow whole. 30 tablet 12   Cyanocobalamin (VITAMIN B-12) 1000 MCG SUBL Place 1 tablet (1,000 mcg total) under the tongue daily. 100 tablet 3   ferrous sulfate 325 (65 FE) MG tablet Take 1 tablet (325 mg total) by mouth 2 (two) times daily with a meal. (Patient taking differently: Take 325 mg by mouth daily with breakfast.) 100 tablet 2   oxybutynin (DITROPAN-XL) 5 MG 24 hr tablet TAKE 1 TABLET (5 MG TOTAL) BY MOUTH AT BEDTIME. FOR FREQUENT URINATION 90 tablet 2   rosuvastatin (CRESTOR) 20 MG tablet Take 1 tablet (20 mg total) by mouth daily. 30 tablet 4   traMADol (ULTRAM) 50 MG tablet TAKE 1 TABLET 3 TIMES A DAY AS NEEDED SEVERE PAIN (Patient taking differently: Take 50 mg by mouth 3 (three) times daily as needed for severe pain.) 90 tablet 3   acetaminophen (TYLENOL) 650 MG CR tablet Take 650 mg by mouth at bedtime. For arthritis in back (Patient not taking: Reported on 04/17/2023)     finasteride (PROSCAR) 5 MG tablet Take 1 tablet (5 mg total) by mouth daily. (Patient not taking: Reported on 04/17/2023) 90 tablet 3   pantoprazole (PROTONIX) 40 MG tablet Take 1 tablet (40 mg total) by mouth daily. (Patient not taking: Reported on 04/17/2023) 30 tablet 3   No facility-administered medications prior to visit.    ROS: Review of Systems  Constitutional:  Negative for appetite change, fatigue and unexpected weight change.  HENT:  Negative for congestion, nosebleeds, sneezing,  sore throat and trouble swallowing.   Eyes:  Positive for visual disturbance. Negative for itching.  Respiratory:  Negative for cough.   Cardiovascular:  Negative for chest pain, palpitations and leg swelling.  Gastrointestinal:  Negative for abdominal distention, blood in stool, diarrhea and nausea.  Genitourinary:  Negative for frequency and hematuria.  Musculoskeletal:  Positive for gait problem. Negative for back pain, joint swelling and neck pain.  Skin:  Negative for color change and rash.  Neurological:  Positive for weakness. Negative for dizziness, tremors and speech difficulty.  Hematological:  Bruises/bleeds easily.  Psychiatric/Behavioral:  Positive for decreased concentration. Negative for agitation, dysphoric mood and sleep disturbance. The patient is not nervous/anxious.     Objective:  BP 135/82   Pulse 95   Temp 97.8 F (36.6 C) (Oral)   Ht 5\' 8"  (1.727 m)   Wt 134 lb 6 oz (61 kg)   SpO2 93%   BMI 20.43 kg/m   BP Readings from Last 3 Encounters:  04/17/23 135/82  04/17/23 131/80  03/13/23 122/82    Wt Readings from Last 3 Encounters:  04/17/23 134 lb 6 oz (61 kg)  04/17/23 135 lb (61.2 kg)  03/13/23 139 lb (63 kg)    Physical Exam Constitutional:      General: He is not in acute distress.    Appearance: Normal appearance. He is well-developed.     Comments: NAD  SFJ      Full                                                        +---------+---------------+---------+-----------+----------+--------------+ FV Prox  Full           Yes      Yes                                 +---------+---------------+---------+-----------+----------+--------------+ FV Mid   Full           Yes      Yes                                 +---------+---------------+---------+-----------+----------+--------------+ FV DistalFull           Yes      Yes                                 +---------+---------------+---------+-----------+----------+--------------+ PFV      Full                                                        +---------+---------------+---------+-----------+----------+--------------+ POP      Full           Yes      Yes                                 +---------+---------------+---------+-----------+----------+--------------+ PTV      Full                                                        +---------+---------------+---------+-----------+----------+--------------+ PERO     Full                                                        +---------+---------------+---------+-----------+----------+--------------+     Summary: BILATERAL: - No evidence of deep vein thrombosis seen in the lower extremities, bilaterally. -No evidence of  popliteal cyst, bilaterally.   *See table(s) above for measurements and observations. Electronically signed by Waverly Ferrari MD on 02/23/2023 at 3:57:45 PM.    Final    ECHOCARDIOGRAM COMPLETE  Result Date: 02/23/2023    ECHOCARDIOGRAM REPORT   Patient Name:   Eric [name] [facility] Date of Exam: 02/23/2023 Medical Rec #:  [id number]         Height:       68.0 in Accession #:    [id number]        Weight:       147.0 lb Date of Birth:  10-Aug-1941  Eyes:     Conjunctiva/sclera: Conjunctivae normal.     Pupils: Pupils are equal, round, and reactive to light.  Neck:     Thyroid: No thyromegaly.     Vascular: No JVD.  Cardiovascular:     Rate and Rhythm: Normal rate and regular rhythm.     Heart sounds: Normal heart sounds. No murmur heard.    No friction rub. No gallop.  Pulmonary:     Effort: Pulmonary effort is normal. No respiratory distress.     Breath sounds: Normal breath sounds. No wheezing or rales.  Chest:     Chest wall: No tenderness.  Abdominal:     General: Bowel sounds are normal. There is no distension.      Palpations: Abdomen is soft. There is no mass.     Tenderness: There is no abdominal tenderness. There is no guarding or rebound.  Musculoskeletal:        General: No tenderness. Normal range of motion.     Cervical back: Normal range of motion.  Lymphadenopathy:     Cervical: No cervical adenopathy.  Skin:    General: Skin is warm and dry.     Findings: No rash.  Neurological:     Mental Status: He is alert and oriented to person, place, and time.     Cranial Nerves: No cranial nerve deficit.     Motor: No abnormal muscle tone.     Coordination: Coordination normal.     Gait: Gait abnormal.     Deep Tendon Reflexes: Reflexes are normal and symmetric.  Psychiatric:        Behavior: Behavior normal.        Thought Content: Thought content normal.        Judgment: Judgment normal.    Using a cane   Lab Results  Component Value Date   WBC 12.3 (H) 02/23/2023   HGB 11.7 (L) 02/23/2023   HCT 36.3 (L) 02/23/2023   PLT 363 02/23/2023   GLUCOSE 106 (H) 02/23/2023   CHOL 183 02/23/2023   TRIG 61 02/23/2023   HDL 59 02/23/2023   LDLDIRECT 163.3 05/26/2008   LDLCALC 112 (H) 02/23/2023   ALT 15 02/23/2023   AST 16 02/23/2023   NA 137 02/23/2023   K 3.8 02/23/2023   CL 105 02/23/2023   CREATININE 0.85 02/23/2023   BUN 6 (L) 02/23/2023   CO2 21 (L) 02/23/2023   TSH 1.239 02/22/2023   PSA 0.42 06/26/2022   INR 1.1 02/22/2023   HGBA1C 5.8 (H) 02/23/2023    VAS [place] LOWER EXTREMITY VENOUS (DVT)  Result Date: 02/23/2023  Lower Venous DVT Study Patient Name:  Eric [name] [facility]  Date of Exam:   02/23/2023 Medical Rec #: [id number]          Accession #:    [id number] Date of Birth: 10-28-1940          Patient Gender: M Patient Age:   82 years Exam Location:  [facility] Procedure:      VAS [place] LOWER EXTREMITY VENOUS (DVT) Referring Phys: Scheryl Marten XU --------------------------------------------------------------------------------  Indications: Stroke.  Limitations: Patient  immobility. Comparison Study: No previous exams Performing Technologist: Jody Hill RVT, RDMS  Examination Guidelines: A complete evaluation includes B-mode imaging, spectral Doppler, color Doppler, and power Doppler as needed of all accessible portions of each vessel. Bilateral testing is considered an integral part of a complete examination. Limited examinations for reoccurring indications may be performed as noted. The reflux portion of the

## 2023-04-17 NOTE — Assessment & Plan Note (Signed)
S/p a small right frontal SAH MRI brain showed small area of acute ischemia in the posterior left frontal lobe Cont on Lotrel, baby ASA, Plavix, Crestor daily OT at home

## 2023-04-17 NOTE — Patient Instructions (Signed)
Continue home health therapy - once completed, if you are interested in additional therapy outpatient please let me know  Follow up with cardiology in October to discuss cardiac monitor results  Continue aspirin 81 mg daily  and rosuvastatin (Crestor)  for secondary stroke prevention  Continue to follow up with PCP regarding cholesterol and blood pressure management  Maintain strict control of hypertension with blood pressure goal below 130/90, diabetes with hemoglobin A1c goal below 7.0 % and cholesterol with LDL cholesterol (bad cholesterol) goal below 70 mg/dL.   Signs of a Stroke? Follow the BEFAST method:  Balance Watch for a sudden loss of balance, trouble with coordination or vertigo Eyes Is there a sudden loss of vision in one or both eyes? Or double vision?  Face: Ask the person to smile. Does one side of the face droop or is it numb?  Arms: Ask the person to raise both arms. Does one arm drift downward? Is there weakness or numbness of a leg? Speech: Ask the person to repeat a simple phrase. Does the speech sound slurred/strange? Is the person confused ? Time: If you observe any of these signs, call 911.        Thank you for coming to see [place] at [facility]. I hope we have been able to provide you high quality care today.  You may receive a patient satisfaction survey over the next few weeks. We would appreciate your feedback and comments so that we may continue to improve ourselves and the health of our patients.

## 2023-04-17 NOTE — Assessment & Plan Note (Signed)
Check CBC 

## 2023-04-18 ENCOUNTER — Other Ambulatory Visit: Payer: Self-pay | Admitting: Internal Medicine

## 2023-04-18 DIAGNOSIS — H5347 Heteronymous bilateral field defects: Secondary | ICD-10-CM | POA: Diagnosis not present

## 2023-04-18 DIAGNOSIS — H269 Unspecified cataract: Secondary | ICD-10-CM | POA: Diagnosis not present

## 2023-04-18 DIAGNOSIS — Z7982 Long term (current) use of aspirin: Secondary | ICD-10-CM | POA: Diagnosis not present

## 2023-04-18 DIAGNOSIS — I69398 Other sequelae of cerebral infarction: Secondary | ICD-10-CM | POA: Diagnosis not present

## 2023-04-18 DIAGNOSIS — E785 Hyperlipidemia, unspecified: Secondary | ICD-10-CM | POA: Diagnosis not present

## 2023-04-18 DIAGNOSIS — M1990 Unspecified osteoarthritis, unspecified site: Secondary | ICD-10-CM | POA: Diagnosis not present

## 2023-04-18 DIAGNOSIS — H547 Unspecified visual loss: Secondary | ICD-10-CM | POA: Diagnosis not present

## 2023-04-18 DIAGNOSIS — I69319 Unspecified symptoms and signs involving cognitive functions following cerebral infarction: Secondary | ICD-10-CM | POA: Diagnosis not present

## 2023-04-18 DIAGNOSIS — Z556 Problems related to health literacy: Secondary | ICD-10-CM | POA: Diagnosis not present

## 2023-04-18 DIAGNOSIS — I509 Heart failure, unspecified: Secondary | ICD-10-CM | POA: Diagnosis not present

## 2023-04-18 DIAGNOSIS — G9340 Encephalopathy, unspecified: Secondary | ICD-10-CM | POA: Diagnosis not present

## 2023-04-18 DIAGNOSIS — J449 Chronic obstructive pulmonary disease, unspecified: Secondary | ICD-10-CM | POA: Diagnosis not present

## 2023-04-18 DIAGNOSIS — Z7902 Long term (current) use of antithrombotics/antiplatelets: Secondary | ICD-10-CM | POA: Diagnosis not present

## 2023-04-18 DIAGNOSIS — I69354 Hemiplegia and hemiparesis following cerebral infarction affecting left non-dominant side: Secondary | ICD-10-CM | POA: Diagnosis not present

## 2023-04-18 DIAGNOSIS — D72829 Elevated white blood cell count, unspecified: Secondary | ICD-10-CM | POA: Diagnosis not present

## 2023-04-18 DIAGNOSIS — I6523 Occlusion and stenosis of bilateral carotid arteries: Secondary | ICD-10-CM | POA: Diagnosis not present

## 2023-04-18 DIAGNOSIS — I110 Hypertensive heart disease with heart failure: Secondary | ICD-10-CM | POA: Diagnosis not present

## 2023-04-18 LAB — IRON,TIBC AND FERRITIN PANEL
%SAT: 15 % — ABNORMAL LOW (ref 20–48)
Ferritin: 20 ng/mL — ABNORMAL LOW (ref 24–380)
Iron: 51 ug/dL (ref 50–180)
TIBC: 347 ug/dL (ref 250–425)

## 2023-04-18 MED ORDER — POTASSIUM CHLORIDE CRYS ER 20 MEQ PO TBCR: 20.0000 meq | EXTENDED_RELEASE_TABLET | Freq: Every day | ORAL | 5 refills | Status: DC

## 2023-04-22 DIAGNOSIS — Z7902 Long term (current) use of antithrombotics/antiplatelets: Secondary | ICD-10-CM | POA: Diagnosis not present

## 2023-04-22 DIAGNOSIS — I6523 Occlusion and stenosis of bilateral carotid arteries: Secondary | ICD-10-CM | POA: Diagnosis not present

## 2023-04-22 DIAGNOSIS — I110 Hypertensive heart disease with heart failure: Secondary | ICD-10-CM | POA: Diagnosis not present

## 2023-04-22 DIAGNOSIS — I509 Heart failure, unspecified: Secondary | ICD-10-CM | POA: Diagnosis not present

## 2023-04-22 DIAGNOSIS — Z556 Problems related to health literacy: Secondary | ICD-10-CM | POA: Diagnosis not present

## 2023-04-22 DIAGNOSIS — E785 Hyperlipidemia, unspecified: Secondary | ICD-10-CM | POA: Diagnosis not present

## 2023-04-22 DIAGNOSIS — I69398 Other sequelae of cerebral infarction: Secondary | ICD-10-CM | POA: Diagnosis not present

## 2023-04-22 DIAGNOSIS — M1990 Unspecified osteoarthritis, unspecified site: Secondary | ICD-10-CM | POA: Diagnosis not present

## 2023-04-22 DIAGNOSIS — D72829 Elevated white blood cell count, unspecified: Secondary | ICD-10-CM | POA: Diagnosis not present

## 2023-04-22 DIAGNOSIS — H5347 Heteronymous bilateral field defects: Secondary | ICD-10-CM | POA: Diagnosis not present

## 2023-04-22 DIAGNOSIS — G9340 Encephalopathy, unspecified: Secondary | ICD-10-CM | POA: Diagnosis not present

## 2023-04-22 DIAGNOSIS — H269 Unspecified cataract: Secondary | ICD-10-CM | POA: Diagnosis not present

## 2023-04-22 DIAGNOSIS — J449 Chronic obstructive pulmonary disease, unspecified: Secondary | ICD-10-CM | POA: Diagnosis not present

## 2023-04-22 DIAGNOSIS — H547 Unspecified visual loss: Secondary | ICD-10-CM | POA: Diagnosis not present

## 2023-04-22 DIAGNOSIS — I69354 Hemiplegia and hemiparesis following cerebral infarction affecting left non-dominant side: Secondary | ICD-10-CM | POA: Diagnosis not present

## 2023-04-22 DIAGNOSIS — Z7982 Long term (current) use of aspirin: Secondary | ICD-10-CM | POA: Diagnosis not present

## 2023-04-22 DIAGNOSIS — I69319 Unspecified symptoms and signs involving cognitive functions following cerebral infarction: Secondary | ICD-10-CM | POA: Diagnosis not present

## 2023-05-02 ENCOUNTER — Ambulatory Visit: Payer: Self-pay

## 2023-05-02 NOTE — Patient Instructions (Signed)
Visit Information  Thank you for taking time to visit with me today. Please don't hesitate to contact me if I can be of assistance to you.   Following are the goals we discussed today:  Continue to take medications as prescribed. Continue to attend provider visits as scheduled Continue to eat healthy, lean meats, vegetables, fruits, avoid saturated and transfats  Our next appointment is by telephone on 06/02/23 at 4:00 pm  Please call the care guide team at [phone] if you need to cancel or reschedule your appointment.   If you are experiencing a Mental Health or Behavioral Health Crisis or need someone to talk to, please call the Suicide and Crisis Lifeline: 988 call the [place] National Suicide Prevention Lifeline: 786-811-1871 or TTY: (832)708-6434 TTY 7324074625) to talk to a trained counselor call 1-800-273-TALK (toll free, 24 hour hotline)  Kathyrn Sheriff, RN, MSN, BSN, CCM Care Management Coordinator [phone]

## 2023-05-02 NOTE — Patient Outreach (Signed)
Care Coordination   Follow Up Visit Note   05/02/2023 Name: Eric [name] MRN: [id number] DOB: [birth year]  Eric [name] is a 82 y.o. year old male who sees Plotnikov, Georgina Quint, MD for primary care. I spoke with  daughter Eric [name]) by phone today.  What matters to the patients health and wellness today?  Eric [name] reports she is not with patient at the time, However she reports that Eric [name] is doing well. He is eating well. She states Eric [name] is oriented for the most part, but sometimes will get some things confused. Eric [name] reports she picked up a new prescription for Kclor-con, but does not know its function.   Goals Addressed             This Visit's Progress    Care coordination-continue to improve post hospitalization       Interventions Today    Flowsheet Row Most Recent Value  Chronic Disease   Chronic disease during today's visit Other  [stoke]  General Interventions   General Interventions Discussed/Reviewed General Interventions Reviewed, Doctor Visits  Doctor Visits Discussed/Reviewed Doctor Visits Discussed, PCP, Specialist  PCP/Specialist Visits Compliance with follow-up visit  [reviewed upcoming appointments. encouraged to attend as scheduled. provided contact number for cardiologist office and encouraged to attend.]  Exercise Interventions   Exercise Discussed/Reviewed Exercise Discussed  [assess activity level. daughter reports patient ambulating without difficulty and has completed home health therapy]  Education Interventions   Education Provided Provided Education  Provided Verbal Education On Other, Medication  [advised continue to take medications as prescribed, attend provider appointment as scheduled. contact provider with any health questions or concerns]  Nutrition Interventions   Nutrition Discussed/Reviewed Nutrition Reviewed  Pharmacy Interventions   Pharmacy Dicussed/Reviewed Pharmacy Topics Discussed, Medications and their  functions  [discussed klor-con and it function. daughter verbalized understanding and states she will give to patient.]  Safety Interventions   Safety Discussed/Reviewed Safety Discussed            SDOH assessments and interventions completed:  No  Care Coordination Interventions:  Yes, provided   Follow up plan: Follow up call scheduled for 06/02/23    Encounter Outcome:  Pt. Visit Completed   Eric Sheriff, RN, MSN, BSN, CCM Care Management Coordinator [phone]

## 2023-05-13 ENCOUNTER — Other Ambulatory Visit: Payer: Self-pay | Admitting: Internal Medicine

## 2023-05-20 ENCOUNTER — Encounter (HOSPITAL_COMMUNITY): Payer: Self-pay

## 2023-05-20 ENCOUNTER — Emergency Department (HOSPITAL_COMMUNITY)
Admission: EM | Admit: 2023-05-20 | Discharge: 2023-05-21 | Discharge status: Against Medical Advice | Payer: Medicare Other | Source: Home / Self Care

## 2023-05-20 DIAGNOSIS — R220 Localized swelling, mass and lump, head: Secondary | ICD-10-CM | POA: Diagnosis not present

## 2023-05-20 DIAGNOSIS — Z66 Do not resuscitate: Secondary | ICD-10-CM | POA: Diagnosis not present

## 2023-05-20 DIAGNOSIS — E870 Hyperosmolality and hypernatremia: Secondary | ICD-10-CM | POA: Diagnosis not present

## 2023-05-20 DIAGNOSIS — G9389 Other specified disorders of brain: Secondary | ICD-10-CM | POA: Diagnosis not present

## 2023-05-20 DIAGNOSIS — S12500A Unspecified displaced fracture of sixth cervical vertebra, initial encounter for closed fracture: Secondary | ICD-10-CM | POA: Diagnosis not present

## 2023-05-20 DIAGNOSIS — S12501A Unspecified nondisplaced fracture of sixth cervical vertebra, initial encounter for closed fracture: Secondary | ICD-10-CM | POA: Diagnosis not present

## 2023-05-20 DIAGNOSIS — Z681 Body mass index (BMI) 19 or less, adult: Secondary | ICD-10-CM | POA: Diagnosis not present

## 2023-05-20 DIAGNOSIS — R1312 Dysphagia, oropharyngeal phase: Secondary | ICD-10-CM | POA: Diagnosis not present

## 2023-05-20 DIAGNOSIS — I639 Cerebral infarction, unspecified: Secondary | ICD-10-CM | POA: Diagnosis not present

## 2023-05-20 DIAGNOSIS — S06340A Traumatic hemorrhage of right cerebrum without loss of consciousness, initial encounter: Secondary | ICD-10-CM | POA: Diagnosis not present

## 2023-05-20 DIAGNOSIS — I7410 Embolism and thrombosis of unspecified parts of aorta: Secondary | ICD-10-CM | POA: Diagnosis not present

## 2023-05-20 DIAGNOSIS — Z5321 Procedure and treatment not carried out due to patient leaving prior to being seen by health care provider: Secondary | ICD-10-CM | POA: Insufficient documentation

## 2023-05-20 DIAGNOSIS — I63542 Cerebral infarction due to unspecified occlusion or stenosis of left cerebellar artery: Secondary | ICD-10-CM | POA: Diagnosis not present

## 2023-05-20 DIAGNOSIS — J449 Chronic obstructive pulmonary disease, unspecified: Secondary | ICD-10-CM | POA: Diagnosis not present

## 2023-05-20 DIAGNOSIS — Z515 Encounter for palliative care: Secondary | ICD-10-CM | POA: Diagnosis not present

## 2023-05-20 DIAGNOSIS — R410 Disorientation, unspecified: Secondary | ICD-10-CM | POA: Insufficient documentation

## 2023-05-20 DIAGNOSIS — I10 Essential (primary) hypertension: Secondary | ICD-10-CM | POA: Diagnosis not present

## 2023-05-20 DIAGNOSIS — G936 Cerebral edema: Secondary | ICD-10-CM | POA: Diagnosis not present

## 2023-05-20 DIAGNOSIS — G8194 Hemiplegia, unspecified affecting left nondominant side: Secondary | ICD-10-CM | POA: Diagnosis not present

## 2023-05-20 DIAGNOSIS — R42 Dizziness and giddiness: Secondary | ICD-10-CM | POA: Insufficient documentation

## 2023-05-20 DIAGNOSIS — D689 Coagulation defect, unspecified: Secondary | ICD-10-CM | POA: Diagnosis not present

## 2023-05-20 DIAGNOSIS — E43 Unspecified severe protein-calorie malnutrition: Secondary | ICD-10-CM | POA: Diagnosis not present

## 2023-05-20 DIAGNOSIS — N280 Ischemia and infarction of kidney: Secondary | ICD-10-CM | POA: Diagnosis not present

## 2023-05-20 DIAGNOSIS — K719 Toxic liver disease, unspecified: Secondary | ICD-10-CM | POA: Diagnosis not present

## 2023-05-20 DIAGNOSIS — R110 Nausea: Secondary | ICD-10-CM | POA: Insufficient documentation

## 2023-05-20 DIAGNOSIS — J690 Pneumonitis due to inhalation of food and vomit: Secondary | ICD-10-CM | POA: Diagnosis not present

## 2023-05-20 DIAGNOSIS — R64 Cachexia: Secondary | ICD-10-CM | POA: Diagnosis not present

## 2023-05-20 DIAGNOSIS — I611 Nontraumatic intracerebral hemorrhage in hemisphere, cortical: Secondary | ICD-10-CM | POA: Diagnosis not present

## 2023-05-20 DIAGNOSIS — K7200 Acute and subacute hepatic failure without coma: Secondary | ICD-10-CM | POA: Diagnosis not present

## 2023-05-20 DIAGNOSIS — R34 Anuria and oliguria: Secondary | ICD-10-CM | POA: Diagnosis not present

## 2023-05-20 DIAGNOSIS — G9341 Metabolic encephalopathy: Secondary | ICD-10-CM | POA: Diagnosis not present

## 2023-05-20 DIAGNOSIS — N179 Acute kidney failure, unspecified: Secondary | ICD-10-CM | POA: Diagnosis not present

## 2023-05-20 LAB — BASIC METABOLIC PANEL
Anion gap: 10 (ref 5–15)
BUN: 10 mg/dL (ref 8–23)
CO2: 25 mmol/L (ref 22–32)
Calcium: 9.6 mg/dL (ref 8.9–10.3)
Chloride: 101 mmol/L (ref 98–111)
Creatinine, Ser: 1.16 mg/dL (ref 0.61–1.24)
GFR, Estimated: >60 mL/min (ref >60)
Glucose, Bld: 128 mg/dL — ABNORMAL HIGH (ref 70–99)
Potassium: 4.2 mmol/L (ref 3.5–5.1)
Sodium: 136 mmol/L (ref 135–145)

## 2023-05-20 LAB — CBC
HCT: 41 % (ref 39.0–52.0)
Hemoglobin: 13.6 g/dL (ref 13.0–17.0)
MCH: 27 pg (ref 26.0–34.0)
MCHC: 33.2 g/dL (ref 30.0–36.0)
MCV: 81.3 fL (ref 80.0–100.0)
Platelets: 326 10*3/uL (ref 150–400)
RBC: 5.04 MIL/uL (ref 4.22–5.81)
RDW: 15.7 % — ABNORMAL HIGH (ref 11.5–15.5)
WBC: 11.3 10*3/uL — ABNORMAL HIGH (ref 4.0–10.5)
nRBC: 0 % (ref 0.0–0.2)

## 2023-05-20 LAB — CBG MONITORING, ED: Glucose-Capillary: 121 mg/dL — ABNORMAL HIGH (ref 70–99)

## 2023-05-20 NOTE — ED Triage Notes (Addendum)
Pt to ED with complaint of dizziness and nausea that began a few hours ago. Patient reports symptoms have improved since onset however son reports some increased confusion. Patient currently denies symptoms.

## 2023-05-22 ENCOUNTER — Emergency Department (HOSPITAL_COMMUNITY): Payer: Medicare Other

## 2023-05-22 ENCOUNTER — Other Ambulatory Visit: Payer: Self-pay

## 2023-05-22 ENCOUNTER — Encounter (HOSPITAL_COMMUNITY): Payer: Self-pay

## 2023-05-22 ENCOUNTER — Inpatient Hospital Stay (HOSPITAL_COMMUNITY)
Admission: EM | Admit: 2023-05-22 | Discharge: 2023-06-14 | DRG: 064 | Disposition: A | Discharge status: Hospice | Payer: Medicare Other | Attending: Internal Medicine | Admitting: Internal Medicine

## 2023-05-22 DIAGNOSIS — Z9181 History of falling: Secondary | ICD-10-CM | POA: Diagnosis not present

## 2023-05-22 DIAGNOSIS — S0083XA Contusion of other part of head, initial encounter: Secondary | ICD-10-CM | POA: Diagnosis not present

## 2023-05-22 DIAGNOSIS — S06340A Traumatic hemorrhage of right cerebrum without loss of consciousness, initial encounter: Secondary | ICD-10-CM | POA: Diagnosis not present

## 2023-05-22 DIAGNOSIS — W1830XA Fall on same level, unspecified, initial encounter: Secondary | ICD-10-CM | POA: Diagnosis present

## 2023-05-22 DIAGNOSIS — R1319 Other dysphagia: Secondary | ICD-10-CM | POA: Diagnosis not present

## 2023-05-22 DIAGNOSIS — I7410 Embolism and thrombosis of unspecified parts of aorta: Secondary | ICD-10-CM | POA: Diagnosis present

## 2023-05-22 DIAGNOSIS — S12400A Unspecified displaced fracture of fifth cervical vertebra, initial encounter for closed fracture: Secondary | ICD-10-CM | POA: Diagnosis not present

## 2023-05-22 DIAGNOSIS — N179 Acute kidney failure, unspecified: Secondary | ICD-10-CM | POA: Diagnosis not present

## 2023-05-22 DIAGNOSIS — R404 Transient alteration of awareness: Secondary | ICD-10-CM | POA: Diagnosis not present

## 2023-05-22 DIAGNOSIS — R1312 Dysphagia, oropharyngeal phase: Secondary | ICD-10-CM | POA: Diagnosis not present

## 2023-05-22 DIAGNOSIS — K7200 Acute and subacute hepatic failure without coma: Secondary | ICD-10-CM | POA: Diagnosis present

## 2023-05-22 DIAGNOSIS — Z7189 Other specified counseling: Secondary | ICD-10-CM | POA: Diagnosis not present

## 2023-05-22 DIAGNOSIS — G9389 Other specified disorders of brain: Secondary | ICD-10-CM | POA: Diagnosis not present

## 2023-05-22 DIAGNOSIS — Y92009 Unspecified place in unspecified non-institutional (private) residence as the place of occurrence of the external cause: Secondary | ICD-10-CM

## 2023-05-22 DIAGNOSIS — S12500A Unspecified displaced fracture of sixth cervical vertebra, initial encounter for closed fracture: Secondary | ICD-10-CM | POA: Diagnosis present

## 2023-05-22 DIAGNOSIS — E870 Hyperosmolality and hypernatremia: Secondary | ICD-10-CM | POA: Diagnosis not present

## 2023-05-22 DIAGNOSIS — M2548 Effusion, other site: Secondary | ICD-10-CM | POA: Diagnosis not present

## 2023-05-22 DIAGNOSIS — I611 Nontraumatic intracerebral hemorrhage in hemisphere, cortical: Secondary | ICD-10-CM | POA: Diagnosis not present

## 2023-05-22 DIAGNOSIS — J449 Chronic obstructive pulmonary disease, unspecified: Secondary | ICD-10-CM | POA: Diagnosis present

## 2023-05-22 DIAGNOSIS — Z66 Do not resuscitate: Secondary | ICD-10-CM | POA: Diagnosis not present

## 2023-05-22 DIAGNOSIS — R638 Other symptoms and signs concerning food and fluid intake: Secondary | ICD-10-CM | POA: Diagnosis not present

## 2023-05-22 DIAGNOSIS — K6389 Other specified diseases of intestine: Secondary | ICD-10-CM | POA: Diagnosis not present

## 2023-05-22 DIAGNOSIS — Z888 Allergy status to other drugs, medicaments and biological substances status: Secondary | ICD-10-CM

## 2023-05-22 DIAGNOSIS — Z4682 Encounter for fitting and adjustment of non-vascular catheter: Secondary | ICD-10-CM | POA: Diagnosis not present

## 2023-05-22 DIAGNOSIS — I63542 Cerebral infarction due to unspecified occlusion or stenosis of left cerebellar artery: Secondary | ICD-10-CM | POA: Diagnosis not present

## 2023-05-22 DIAGNOSIS — R29818 Other symptoms and signs involving the nervous system: Secondary | ICD-10-CM | POA: Diagnosis not present

## 2023-05-22 DIAGNOSIS — I609 Nontraumatic subarachnoid hemorrhage, unspecified: Secondary | ICD-10-CM | POA: Diagnosis not present

## 2023-05-22 DIAGNOSIS — R937 Abnormal findings on diagnostic imaging of other parts of musculoskeletal system: Secondary | ICD-10-CM | POA: Diagnosis not present

## 2023-05-22 DIAGNOSIS — Z7982 Long term (current) use of aspirin: Secondary | ICD-10-CM

## 2023-05-22 DIAGNOSIS — G936 Cerebral edema: Secondary | ICD-10-CM | POA: Diagnosis not present

## 2023-05-22 DIAGNOSIS — N400 Enlarged prostate without lower urinary tract symptoms: Secondary | ICD-10-CM | POA: Diagnosis present

## 2023-05-22 DIAGNOSIS — R64 Cachexia: Secondary | ICD-10-CM | POA: Diagnosis not present

## 2023-05-22 DIAGNOSIS — I629 Nontraumatic intracranial hemorrhage, unspecified: Secondary | ICD-10-CM | POA: Diagnosis not present

## 2023-05-22 DIAGNOSIS — M549 Dorsalgia, unspecified: Secondary | ICD-10-CM | POA: Diagnosis not present

## 2023-05-22 DIAGNOSIS — N280 Ischemia and infarction of kidney: Secondary | ICD-10-CM | POA: Diagnosis not present

## 2023-05-22 DIAGNOSIS — F05 Delirium due to known physiological condition: Secondary | ICD-10-CM | POA: Diagnosis not present

## 2023-05-22 DIAGNOSIS — I639 Cerebral infarction, unspecified: Secondary | ICD-10-CM | POA: Diagnosis not present

## 2023-05-22 DIAGNOSIS — J690 Pneumonitis due to inhalation of food and vomit: Secondary | ICD-10-CM | POA: Diagnosis not present

## 2023-05-22 DIAGNOSIS — R54 Age-related physical debility: Secondary | ICD-10-CM | POA: Diagnosis present

## 2023-05-22 DIAGNOSIS — S0636AA Traumatic hemorrhage of cerebrum, unspecified, with loss of consciousness status unknown, initial encounter: Secondary | ICD-10-CM

## 2023-05-22 DIAGNOSIS — E875 Hyperkalemia: Secondary | ICD-10-CM | POA: Diagnosis present

## 2023-05-22 DIAGNOSIS — R7989 Other specified abnormal findings of blood chemistry: Secondary | ICD-10-CM | POA: Diagnosis present

## 2023-05-22 DIAGNOSIS — G9341 Metabolic encephalopathy: Secondary | ICD-10-CM | POA: Diagnosis not present

## 2023-05-22 DIAGNOSIS — E43 Unspecified severe protein-calorie malnutrition: Secondary | ICD-10-CM | POA: Insufficient documentation

## 2023-05-22 DIAGNOSIS — R0602 Shortness of breath: Secondary | ICD-10-CM | POA: Diagnosis not present

## 2023-05-22 DIAGNOSIS — R569 Unspecified convulsions: Secondary | ICD-10-CM | POA: Diagnosis not present

## 2023-05-22 DIAGNOSIS — W19XXXA Unspecified fall, initial encounter: Secondary | ICD-10-CM | POA: Diagnosis not present

## 2023-05-22 DIAGNOSIS — R748 Abnormal levels of other serum enzymes: Secondary | ICD-10-CM

## 2023-05-22 DIAGNOSIS — Z711 Person with feared health complaint in whom no diagnosis is made: Secondary | ICD-10-CM | POA: Diagnosis not present

## 2023-05-22 DIAGNOSIS — E785 Hyperlipidemia, unspecified: Secondary | ICD-10-CM | POA: Diagnosis present

## 2023-05-22 DIAGNOSIS — I6389 Other cerebral infarction: Secondary | ICD-10-CM | POA: Diagnosis not present

## 2023-05-22 DIAGNOSIS — Z681 Body mass index (BMI) 19 or less, adult: Secondary | ICD-10-CM | POA: Diagnosis not present

## 2023-05-22 DIAGNOSIS — R9089 Other abnormal findings on diagnostic imaging of central nervous system: Secondary | ICD-10-CM | POA: Diagnosis not present

## 2023-05-22 DIAGNOSIS — Z515 Encounter for palliative care: Secondary | ICD-10-CM

## 2023-05-22 DIAGNOSIS — Z7901 Long term (current) use of anticoagulants: Secondary | ICD-10-CM

## 2023-05-22 DIAGNOSIS — R338 Other retention of urine: Secondary | ICD-10-CM | POA: Diagnosis present

## 2023-05-22 DIAGNOSIS — R188 Other ascites: Secondary | ICD-10-CM | POA: Diagnosis not present

## 2023-05-22 DIAGNOSIS — I10 Essential (primary) hypertension: Secondary | ICD-10-CM | POA: Diagnosis not present

## 2023-05-22 DIAGNOSIS — S12000A Unspecified displaced fracture of first cervical vertebra, initial encounter for closed fracture: Secondary | ICD-10-CM | POA: Diagnosis not present

## 2023-05-22 DIAGNOSIS — S12501A Unspecified nondisplaced fracture of sixth cervical vertebra, initial encounter for closed fracture: Secondary | ICD-10-CM | POA: Diagnosis not present

## 2023-05-22 DIAGNOSIS — R410 Disorientation, unspecified: Secondary | ICD-10-CM | POA: Diagnosis not present

## 2023-05-22 DIAGNOSIS — R000 Tachycardia, unspecified: Secondary | ICD-10-CM | POA: Diagnosis not present

## 2023-05-22 DIAGNOSIS — M4802 Spinal stenosis, cervical region: Secondary | ICD-10-CM | POA: Diagnosis not present

## 2023-05-22 DIAGNOSIS — Z789 Other specified health status: Secondary | ICD-10-CM | POA: Diagnosis not present

## 2023-05-22 DIAGNOSIS — K719 Toxic liver disease, unspecified: Secondary | ICD-10-CM | POA: Diagnosis not present

## 2023-05-22 DIAGNOSIS — D689 Coagulation defect, unspecified: Secondary | ICD-10-CM | POA: Diagnosis present

## 2023-05-22 DIAGNOSIS — I6523 Occlusion and stenosis of bilateral carotid arteries: Secondary | ICD-10-CM | POA: Diagnosis not present

## 2023-05-22 DIAGNOSIS — E876 Hypokalemia: Secondary | ICD-10-CM | POA: Diagnosis not present

## 2023-05-22 DIAGNOSIS — M2578 Osteophyte, vertebrae: Secondary | ICD-10-CM | POA: Diagnosis not present

## 2023-05-22 DIAGNOSIS — N401 Enlarged prostate with lower urinary tract symptoms: Secondary | ICD-10-CM | POA: Diagnosis present

## 2023-05-22 DIAGNOSIS — G9340 Encephalopathy, unspecified: Secondary | ICD-10-CM | POA: Diagnosis present

## 2023-05-22 DIAGNOSIS — Z87891 Personal history of nicotine dependence: Secondary | ICD-10-CM

## 2023-05-22 DIAGNOSIS — R918 Other nonspecific abnormal finding of lung field: Secondary | ICD-10-CM | POA: Diagnosis not present

## 2023-05-22 DIAGNOSIS — A419 Sepsis, unspecified organism: Secondary | ICD-10-CM | POA: Diagnosis not present

## 2023-05-22 DIAGNOSIS — R0682 Tachypnea, not elsewhere classified: Secondary | ICD-10-CM | POA: Diagnosis not present

## 2023-05-22 DIAGNOSIS — N182 Chronic kidney disease, stage 2 (mild): Secondary | ICD-10-CM | POA: Diagnosis not present

## 2023-05-22 DIAGNOSIS — R29710 NIHSS score 10: Secondary | ICD-10-CM | POA: Diagnosis present

## 2023-05-22 DIAGNOSIS — K5900 Constipation, unspecified: Secondary | ICD-10-CM | POA: Diagnosis not present

## 2023-05-22 DIAGNOSIS — Z8249 Family history of ischemic heart disease and other diseases of the circulatory system: Secondary | ICD-10-CM

## 2023-05-22 DIAGNOSIS — Z7401 Bed confinement status: Secondary | ICD-10-CM | POA: Diagnosis not present

## 2023-05-22 DIAGNOSIS — R140 Abdominal distension (gaseous): Secondary | ICD-10-CM | POA: Diagnosis not present

## 2023-05-22 DIAGNOSIS — G8194 Hemiplegia, unspecified affecting left nondominant side: Secondary | ICD-10-CM | POA: Diagnosis present

## 2023-05-22 DIAGNOSIS — R627 Adult failure to thrive: Secondary | ICD-10-CM | POA: Diagnosis present

## 2023-05-22 DIAGNOSIS — I63521 Cerebral infarction due to unspecified occlusion or stenosis of right anterior cerebral artery: Secondary | ICD-10-CM | POA: Diagnosis not present

## 2023-05-22 DIAGNOSIS — Z79899 Other long term (current) drug therapy: Secondary | ICD-10-CM

## 2023-05-22 DIAGNOSIS — R34 Anuria and oliguria: Secondary | ICD-10-CM | POA: Diagnosis not present

## 2023-05-22 DIAGNOSIS — S12501D Unspecified nondisplaced fracture of sixth cervical vertebra, subsequent encounter for fracture with routine healing: Secondary | ICD-10-CM | POA: Diagnosis not present

## 2023-05-22 DIAGNOSIS — R339 Retention of urine, unspecified: Secondary | ICD-10-CM | POA: Diagnosis not present

## 2023-05-22 DIAGNOSIS — R531 Weakness: Secondary | ICD-10-CM | POA: Diagnosis not present

## 2023-05-22 DIAGNOSIS — K769 Liver disease, unspecified: Secondary | ICD-10-CM | POA: Diagnosis not present

## 2023-05-22 DIAGNOSIS — J984 Other disorders of lung: Secondary | ICD-10-CM | POA: Diagnosis not present

## 2023-05-22 DIAGNOSIS — R220 Localized swelling, mass and lump, head: Secondary | ICD-10-CM | POA: Diagnosis not present

## 2023-05-22 DIAGNOSIS — R471 Dysarthria and anarthria: Secondary | ICD-10-CM | POA: Diagnosis present

## 2023-05-22 LAB — COMPREHENSIVE METABOLIC PANEL
ALT: 27 U/L (ref 0–44)
AST: 30 U/L (ref 15–41)
Albumin: 4.3 g/dL (ref 3.5–5.0)
Alkaline Phosphatase: 60 U/L (ref 38–126)
Anion gap: 11 (ref 5–15)
BUN: 9 mg/dL (ref 8–23)
CO2: 23 mmol/L (ref 22–32)
Calcium: 9.6 mg/dL (ref 8.9–10.3)
Chloride: 102 mmol/L (ref 98–111)
Creatinine, Ser: 0.97 mg/dL (ref 0.61–1.24)
GFR, Estimated: >60 mL/min (ref >60)
Glucose, Bld: 114 mg/dL — ABNORMAL HIGH (ref 70–99)
Potassium: 3.8 mmol/L (ref 3.5–5.1)
Sodium: 136 mmol/L (ref 135–145)
Total Bilirubin: 1.2 mg/dL (ref 0.3–1.2)
Total Protein: 8.1 g/dL (ref 6.5–8.1)

## 2023-05-22 LAB — CBC
HCT: 42.5 % (ref 39.0–52.0)
Hemoglobin: 14.1 g/dL (ref 13.0–17.0)
MCH: 27.1 pg (ref 26.0–34.0)
MCHC: 33.2 g/dL (ref 30.0–36.0)
MCV: 81.7 fL (ref 80.0–100.0)
Platelets: 317 10*3/uL (ref 150–400)
RBC: 5.2 MIL/uL (ref 4.22–5.81)
RDW: 15.4 % (ref 11.5–15.5)
WBC: 9.8 10*3/uL (ref 4.0–10.5)
nRBC: 0 % (ref 0.0–0.2)

## 2023-05-22 LAB — MAGNESIUM: Magnesium: 1.9 mg/dL (ref 1.7–2.4)

## 2023-05-22 LAB — CBG MONITORING, ED: Glucose-Capillary: 111 mg/dL — ABNORMAL HIGH (ref 70–99)

## 2023-05-22 LAB — TROPONIN I (HIGH SENSITIVITY)
Troponin I (High Sensitivity): 20 ng/L — ABNORMAL HIGH (ref <18)
Troponin I (High Sensitivity): 28 ng/L — ABNORMAL HIGH (ref <18)

## 2023-05-22 MED ORDER — ACETAMINOPHEN 325 MG PO TABS
650.0000 mg | ORAL_TABLET | Freq: Four times a day (QID) | ORAL | Status: DC | PRN
  Administered 2023-05-24 – 2023-05-26 (×3): 650 mg via ORAL
  Filled 2023-05-22 (×5): qty 2

## 2023-05-22 MED ORDER — DIPHENHYDRAMINE HCL 50 MG/ML IJ SOLN
50.0000 mg | Freq: Once | INTRAMUSCULAR | Status: AC
  Administered 2023-05-22: 50 mg via INTRAVENOUS
  Filled 2023-05-22: qty 1

## 2023-05-22 MED ORDER — ACETAMINOPHEN 650 MG RE SUPP: 650.0000 mg | Freq: Four times a day (QID) | RECTAL | Status: DC | PRN

## 2023-05-22 MED ORDER — HALOPERIDOL LACTATE 5 MG/ML IJ SOLN: 5.0000 mg | Freq: Once | INTRAMUSCULAR | Status: AC | PRN

## 2023-05-22 MED ORDER — STROKE: EARLY STAGES OF RECOVERY BOOK
Freq: Once | Status: AC
  Administered 2023-05-24: 1
  Filled 2023-05-22 (×2): qty 1

## 2023-05-22 MED ORDER — STROKE: EARLY STAGES OF RECOVERY BOOK
Freq: Once | Status: DC
  Filled 2023-05-22: qty 1

## 2023-05-22 MED ORDER — LABETALOL HCL 5 MG/ML IV SOLN
10.0000 mg | INTRAVENOUS | Status: DC | PRN
  Administered 2023-05-23: 10 mg via INTRAVENOUS
  Filled 2023-05-22 (×2): qty 4

## 2023-05-22 MED ORDER — MELATONIN 3 MG PO TABS
3.0000 mg | ORAL_TABLET | Freq: Every evening | ORAL | Status: DC | PRN
  Administered 2023-05-24 – 2023-05-25 (×3): 3 mg via ORAL
  Filled 2023-05-22 (×5): qty 1

## 2023-05-22 MED ORDER — HALOPERIDOL LACTATE 5 MG/ML IJ SOLN
INTRAMUSCULAR | Status: AC
  Administered 2023-05-22: 5 mg via INTRAVENOUS
  Filled 2023-05-22: qty 1

## 2023-05-22 MED ORDER — LORAZEPAM 2 MG/ML IJ SOLN
0.5000 mg | Freq: Once | INTRAMUSCULAR | Status: AC
  Administered 2023-05-22: 0.5 mg via INTRAVENOUS
  Filled 2023-05-22: qty 1

## 2023-05-22 MED ORDER — SODIUM CHLORIDE 0.9 % IV BOLUS
1000.0000 mL | Freq: Once | INTRAVENOUS | Status: AC
  Administered 2023-05-22: 1000 mL via INTRAVENOUS

## 2023-05-22 MED ORDER — ONDANSETRON HCL 4 MG/2ML IJ SOLN: 4.0000 mg | Freq: Four times a day (QID) | INTRAMUSCULAR | Status: DC | PRN

## 2023-05-22 MED ORDER — FENTANYL CITRATE PF 50 MCG/ML IJ SOSY
50.0000 ug | PREFILLED_SYRINGE | Freq: Once | INTRAMUSCULAR | Status: AC
  Administered 2023-05-22: 50 ug via INTRAVENOUS
  Filled 2023-05-22: qty 1

## 2023-05-22 MED ORDER — PROCHLORPERAZINE EDISYLATE 10 MG/2ML IJ SOLN
10.0000 mg | Freq: Once | INTRAMUSCULAR | Status: AC
  Administered 2023-05-22: 10 mg via INTRAVENOUS
  Filled 2023-05-22: qty 2

## 2023-05-22 NOTE — ED Provider Notes (Signed)
[place] EMERGENCY DEPARTMENT AT [facility] Provider Note   CSN: [id number] Arrival date & time: 05/22/23  1951     History  Chief Complaint  Patient presents with   Fall   Altered Mental Status    Eric [name] is a 82 y.o. male.   Fall This is a new problem. The current episode started 6 to 12 hours ago. The problem occurs rarely. The problem has not changed since onset.Associated symptoms include headaches. Pertinent negatives include no chest pain, no abdominal pain and no shortness of breath. Nothing aggravates the symptoms. Nothing relieves the symptoms. He has tried nothing for the symptoms. The treatment provided no relief.  Altered Mental Status Presenting symptoms: no confusion   Associated symptoms: headaches and light-headedness   Associated symptoms: no abdominal pain, no agitation, no fever, no nausea, no palpitations, no rash, no vomiting and no weakness        Home Medications Prior to Admission medications   Medication Sig Start Date End Date Taking? Authorizing Provider  acetaminophen (TYLENOL) 650 MG CR tablet Take 650 mg by mouth at bedtime. For arthritis in back Patient not taking: Reported on 04/17/2023    [provider]  amLODipine-benazepril (LOTREL) 5-20 MG capsule TAKE 1 CAPSULE BY MOUTH EVERY DAY Patient taking differently: Take 1 capsule by mouth daily. 10/28/22   Plotnikov, Georgina Quint, MD  aspirin EC 81 MG tablet Take 1 tablet (81 mg total) by mouth daily. Swallow whole. 02/25/23   Rai, Delene Ruffini, MD  Cyanocobalamin (VITAMIN B-12) 1000 MCG SUBL Place 1 tablet (1,000 mcg total) under the tongue daily. 04/24/21   Plotnikov, Georgina Quint, MD  ferrous sulfate 325 (65 FE) MG tablet Take 1 tablet (325 mg total) by mouth 2 (two) times daily with a meal. Patient taking differently: Take 325 mg by mouth daily with breakfast. 12/29/22 12/29/23  Plotnikov, Georgina Quint, MD  finasteride (PROSCAR) 5 MG tablet Take 1 tablet (5 mg total) by mouth  daily. Patient not taking: Reported on 04/17/2023 06/26/22   Plotnikov, Georgina Quint, MD  oxybutynin (DITROPAN-XL) 5 MG 24 hr tablet TAKE 1 TABLET (5 MG TOTAL) BY MOUTH AT BEDTIME. FOR FREQUENT URINATION 05/14/23   Plotnikov, Georgina Quint, MD  pantoprazole (PROTONIX) 40 MG tablet Take 1 tablet (40 mg total) by mouth daily. Patient not taking: Reported on 04/17/2023 02/24/23   Rai, Delene Ruffini, MD  potassium chloride SA (KLOR-CON M) 20 MEQ tablet Take 1 tablet (20 mEq total) by mouth daily. 04/18/23   Plotnikov, Georgina Quint, MD  rosuvastatin (CRESTOR) 20 MG tablet Take 1 tablet (20 mg total) by mouth daily. 02/25/23   Rai, Ripudeep Kirtland Bouchard, MD  traMADol (ULTRAM) 50 MG tablet TAKE 1 TABLET 3 TIMES A DAY AS NEEDED SEVERE PAIN Patient taking differently: Take 50 mg by mouth 3 (three) times daily as needed for severe pain. 02/14/23   Plotnikov, Georgina Quint, MD      Allergies    Hctz [hydrochlorothiazide]    Review of Systems   Review of Systems  Constitutional:  Negative for chills, fatigue and fever.  HENT:  Negative for congestion.   Respiratory:  Negative for cough, chest tightness and shortness of breath.   Cardiovascular:  Negative for chest pain and palpitations.  Gastrointestinal:  Negative for abdominal pain, constipation, diarrhea, nausea and vomiting.  Genitourinary:  Negative for dysuria and flank pain.  Musculoskeletal:  Negative for back pain, neck pain and neck stiffness.  Skin:  Negative for rash and wound.  Neurological:  Positive for light-headedness, numbness and headaches. Negative for syncope, speech difficulty and weakness.  Psychiatric/Behavioral:  Negative for agitation and confusion.   All other systems reviewed and are negative.   Physical Exam Updated Vital Signs BP (!) 165/95 (BP Location: Left Arm)   Pulse (!) 110   Temp 98.7 F (37.1 C) (Oral)   Resp 20   Ht 5\' 8"  (1.727 m)   Wt 61 kg   SpO2 94%   BMI 20.45 kg/m  Physical Exam Vitals and nursing note reviewed.   Constitutional:      General: He is not in acute distress.    Appearance: He is well-developed. He is not ill-appearing, toxic-appearing or diaphoretic.  HENT:     Head: Normocephalic and atraumatic.     Nose: No congestion or rhinorrhea.     Mouth/Throat:     Mouth: Mucous membranes are moist.     Pharynx: No oropharyngeal exudate or posterior oropharyngeal erythema.  Eyes:     Extraocular Movements: Extraocular movements intact.     Conjunctiva/sclera: Conjunctivae normal.     Pupils: Pupils are equal, round, and reactive to light.  Cardiovascular:     Rate and Rhythm: Normal rate and regular rhythm.     Heart sounds: No murmur heard. Pulmonary:     Effort: Pulmonary effort is normal. No respiratory distress.     Breath sounds: Normal breath sounds. No wheezing, rhonchi or rales.  Chest:     Chest wall: No tenderness.  Abdominal:     Palpations: Abdomen is soft.     Tenderness: There is no abdominal tenderness. There is no right CVA tenderness, left CVA tenderness, guarding or rebound.  Musculoskeletal:        General: No swelling or tenderness.     Cervical back: Neck supple. No tenderness.     Right lower leg: No edema.     Left lower leg: No edema.  Skin:    General: Skin is warm and dry.     Capillary Refill: Capillary refill takes less than 2 seconds.     Findings: No erythema or rash.  Neurological:     Mental Status: He is alert.     Sensory: Sensory deficit present.     Motor: No weakness.     Comments: Numbness of right face right arm and right leg which she reports is unchanged from baseline.  Psychiatric:        Mood and Affect: Mood normal.     ED Results / Procedures / Treatments   Labs (all labs ordered are listed, but only abnormal results are displayed) Labs Reviewed  COMPREHENSIVE METABOLIC PANEL - Abnormal; Notable for the following components:      Result Value   Glucose, Bld 114 (*)    All other components within normal limits  CBG  MONITORING, ED - Abnormal; Notable for the following components:   Glucose-Capillary 111 (*)    All other components within normal limits  TROPONIN I (HIGH SENSITIVITY) - Abnormal; Notable for the following components:   Troponin I (High Sensitivity) 20 (*)    All other components within normal limits  TROPONIN I (HIGH SENSITIVITY) - Abnormal; Notable for the following components:   Troponin I (High Sensitivity) 28 (*)    All other components within normal limits  CBC  MAGNESIUM  TSH  URINALYSIS, ROUTINE W REFLEX MICROSCOPIC  CBC WITH DIFFERENTIAL/PLATELET  COMPREHENSIVE METABOLIC PANEL  MAGNESIUM  LIPID PANEL  VITAMIN B12  PROTIME-INR  RAPID  URINE DRUG SCREEN, HOSP PERFORMED    EKG None  Radiology DG Chest Port 1 View  Result Date: 05/23/2023 CLINICAL DATA:  Elevated troponin, history of recent fall with altered mental status EXAM: PORTABLE CHEST 1 VIEW COMPARISON:  02/23/2019 FINDINGS: The heart size and mediastinal contours are within normal limits. Both lungs are clear. The visualized skeletal structures are unremarkable. IMPRESSION: No active disease. Electronically Signed   By: Alcide Clever M.D.   On: 05/23/2023 00:43   CT Head Wo Contrast  Result Date: 05/22/2023 CLINICAL DATA:  Fall, head strike. Pain. Redness and swelling to face. EXAM: CT HEAD WITHOUT CONTRAST CT CERVICAL SPINE WITHOUT CONTRAST TECHNIQUE: Multidetector CT imaging of the head and cervical spine was performed following the standard protocol without intravenous contrast. Multiplanar CT image reconstructions of the cervical spine were also generated. RADIATION DOSE REDUCTION: This exam was performed according to the departmental dose-optimization program which includes automated exposure control, adjustment of the mA and/or kV according to patient size and/or use of iterative reconstruction technique. COMPARISON:  CT head 02/22/2023, CTA head/neck 02/22/2023. FINDINGS: CT HEAD FINDINGS Brain: There is a 9 mm  focus of intraparenchymal hemorrhage in the right frontal lobe with mild surrounding edema and apparent loss of gray-white differentiation (3-16). There is no mass effect. There is mild swelling in the overlying scalp. There is no other evidence of acute intracranial hemorrhage. There is no evidence of acute territorial infarct. Extensive infarcts are again seen throughout the bilateral cerebral hemispheres. Infarct in the left parietal lobe is new since the prior study and may be acute (3-22, 6-50). The other cortical infarcts with associated encephalomalacia both cerebral hemispheres, as well as smaller infarcts in the bilateral basal ganglia and left cerebellar hemisphere are unchanged. The ventricles are stable in size. There is no intraventricular hemorrhage. The pituitary and suprasellar region are normal. There is no mass lesion. There is no midline shift. Vascular: There is calcification of the bilateral carotid siphons. Skull: Normal. Negative for fracture or focal lesion. Sinuses/Orbits: The paranasal sinuses are clear. Bilateral lens implants are in place. The globes and orbits are otherwise unremarkable. Other: The mastoid air cells and middle ear cavities are clear. There is mild right frontal scalp swelling. CT CERVICAL SPINE FINDINGS Alignment: There is slight reversal of the normal cervical curvature with trace stepwise retrolisthesis of C3 on C4 through C5 on C6, likely degenerative in nature. There is no jumped or perched facet or other evidence of traumatic malalignment. Skull base and vertebrae: Skull base alignment is maintained. Vertebral body heights are preserved. There is an acute fracture through the anteroinferior corner of the C6 vertebral body (7-34). There is no other evidence of acute fracture. There is no suspicious osseous lesion. Soft tissues and spinal canal: There is prevertebral swelling anterior to the C6 fracture. There is no visible canal hematoma. Disc levels: There is  moderate disc space narrowing and degenerative endplate change and mild facet arthropathy throughout the cervical spine. There is no evidence of high-grade spinal canal stenosis. Upper chest: The imaged lung apices are clear. Other: None. IMPRESSION: 1. 0.9 cm focus of intraparenchymal hemorrhage in the right frontal lobe is favored to reflect a small hemorrhagic contusion given overlying scalp swelling. Differential includes hemorrhagic transformation of a subacute infarct. No significant mass effect. 2. Suspect acute to subacute cortical infarct in the left parietal lobe, new since the CT head from 02/22/2023. Multiple additional remote infarcts in the cerebral and cerebellar hemispheres are unchanged. 3. Acute nondisplaced fracture  through the antero inferior corner of the C6 vertebral body with associated prevertebral soft tissue swelling. Consider MRI to evaluate for ligamentous injury as indicated. Critical Value/emergent results were called by telephone at the time of interpretation on 05/22/2023 at 8:51 pm to provider Dr Webb Silversmith, who verbally acknowledged these results. Electronically Signed   By: Lesia Hausen M.D.   On: 05/22/2023 20:53   CT Cervical Spine Wo Contrast  Result Date: 05/22/2023 CLINICAL DATA:  Fall, head strike. Pain. Redness and swelling to face. EXAM: CT HEAD WITHOUT CONTRAST CT CERVICAL SPINE WITHOUT CONTRAST TECHNIQUE: Multidetector CT imaging of the head and cervical spine was performed following the standard protocol without intravenous contrast. Multiplanar CT image reconstructions of the cervical spine were also generated. RADIATION DOSE REDUCTION: This exam was performed according to the departmental dose-optimization program which includes automated exposure control, adjustment of the mA and/or kV according to patient size and/or use of iterative reconstruction technique. COMPARISON:  CT head 02/22/2023, CTA head/neck 02/22/2023. FINDINGS: CT HEAD FINDINGS Brain: There is a 9 mm  focus of intraparenchymal hemorrhage in the right frontal lobe with mild surrounding edema and apparent loss of gray-white differentiation (3-16). There is no mass effect. There is mild swelling in the overlying scalp. There is no other evidence of acute intracranial hemorrhage. There is no evidence of acute territorial infarct. Extensive infarcts are again seen throughout the bilateral cerebral hemispheres. Infarct in the left parietal lobe is new since the prior study and may be acute (3-22, 6-50). The other cortical infarcts with associated encephalomalacia both cerebral hemispheres, as well as smaller infarcts in the bilateral basal ganglia and left cerebellar hemisphere are unchanged. The ventricles are stable in size. There is no intraventricular hemorrhage. The pituitary and suprasellar region are normal. There is no mass lesion. There is no midline shift. Vascular: There is calcification of the bilateral carotid siphons. Skull: Normal. Negative for fracture or focal lesion. Sinuses/Orbits: The paranasal sinuses are clear. Bilateral lens implants are in place. The globes and orbits are otherwise unremarkable. Other: The mastoid air cells and middle ear cavities are clear. There is mild right frontal scalp swelling. CT CERVICAL SPINE FINDINGS Alignment: There is slight reversal of the normal cervical curvature with trace stepwise retrolisthesis of C3 on C4 through C5 on C6, likely degenerative in nature. There is no jumped or perched facet or other evidence of traumatic malalignment. Skull base and vertebrae: Skull base alignment is maintained. Vertebral body heights are preserved. There is an acute fracture through the anteroinferior corner of the C6 vertebral body (7-34). There is no other evidence of acute fracture. There is no suspicious osseous lesion. Soft tissues and spinal canal: There is prevertebral swelling anterior to the C6 fracture. There is no visible canal hematoma. Disc levels: There is  moderate disc space narrowing and degenerative endplate change and mild facet arthropathy throughout the cervical spine. There is no evidence of high-grade spinal canal stenosis. Upper chest: The imaged lung apices are clear. Other: None. IMPRESSION: 1. 0.9 cm focus of intraparenchymal hemorrhage in the right frontal lobe is favored to reflect a small hemorrhagic contusion given overlying scalp swelling. Differential includes hemorrhagic transformation of a subacute infarct. No significant mass effect. 2. Suspect acute to subacute cortical infarct in the left parietal lobe, new since the CT head from 02/22/2023. Multiple additional remote infarcts in the cerebral and cerebellar hemispheres are unchanged. 3. Acute nondisplaced fracture through the antero inferior corner of the C6 vertebral body with associated prevertebral soft tissue  swelling. Consider MRI to evaluate for ligamentous injury as indicated. Critical Value/emergent results were called by telephone at the time of interpretation on 05/22/2023 at 8:51 pm to provider Dr Webb Silversmith, who verbally acknowledged these results. Electronically Signed   By: Lesia Hausen M.D.   On: 05/22/2023 20:53    Procedures Procedures    Medications Ordered in ED Medications  acetaminophen (TYLENOL) tablet 650 mg (has no administration in time range)    Or  acetaminophen (TYLENOL) suppository 650 mg (has no administration in time range)  melatonin tablet 3 mg (has no administration in time range)  ondansetron (ZOFRAN) injection 4 mg (has no administration in time range)  labetalol (NORMODYNE) injection 10 mg ( Intravenous Not Given 05/23/23 0031)   stroke: early stages of recovery book (has no administration in time range)  naloxone [facility]) injection 0.4 mg (has no administration in time range)  fentaNYL (SUBLIMAZE) injection 25 mcg (has no administration in time range)  prochlorperazine (COMPAZINE) injection 10 mg (10 mg Intravenous Given 05/22/23 2212)   diphenhydrAMINE (BENADRYL) injection 50 mg (50 mg Intravenous Given 05/22/23 2212)  fentaNYL (SUBLIMAZE) injection 50 mcg (50 mcg Intravenous Given 05/22/23 2205)  sodium chloride 0.9 % bolus 1,000 mL (1,000 mLs Intravenous New Bag/Given 05/22/23 2215)  LORazepam (ATIVAN) injection 0.5 mg (0.5 mg Intravenous Given 05/22/23 2256)  haloperidol lactate (HALDOL) injection 5 mg (5 mg Intravenous Given 05/22/23 2347)  haloperidol lactate (HALDOL) injection 5 mg (5 mg Intravenous Given 05/23/23 0027)  metoprolol tartrate (LOPRESSOR) injection 5 mg (5 mg Intravenous Not Given 05/23/23 0031)    ED Course/ Medical Decision Making/ A&P                                 Medical Decision Making Risk Prescription drug management. Decision regarding hospitalization.    LAREN ESTRIDGE is a 82 y.o. male with past medical history significant for hypertension, COPD, previous strokes, BPH, and chronic vision problems who presents for altered mental status and fall.  According to family, patient has not been acting quite right for the last few days although he has had some mental decline for months they report.  He has been more confused more acutely and actually came to get evaluated below without being seen 2 days ago with a long wait.  They report that today he had a mechanical fall hitting the back of his head and front of his head and present for evaluation.  Patient is having severe pain in his head and neck.  He denies any new vision changes or any new focal numbness or weakness.  He denies fevers, chills, congestion, cough, nausea, vomiting, constipation, diarrhea, or urinary changes.  On exam, lungs clear.  Chest nontender.  Abdomen nontender.  He has decreased sensation in right face, right arm, and right leg compared to left although he does not know when that began and thinks it is old.  Had symmetric strength, initial exam.  Speech was clear for me.  Pupils symmetric and are unchanged from baseline per  family.  Patient had CT imaging in triage given the mental status changes in the fall.  I was called by radiology that revealed a C6 fracture and concern for ligamentous injury MRI recommended, evidence of acute to subacute stroke, and a hemorrhage that could either be hemorrhagic conversion of acute stroke versus contusion or intraparenchymal bleed.  Will call neurosurgery given the C-spine injury and bleeding in the  head but will call neurology as well.  Anticipate MRIs and admission for further management.  9:31 PM Spoke to Dr. Otelia Limes with neurology who recommend admission to [facility] so the neurology team can see the patient with what he thinks is likely early subacute stroke on the CT.  He agreed with MRI of the head and neck.  Will wait for neurosurgery callback and then call for admission to [facility] for further management.  9:35 PM Spoke to the neurosurgery team who agrees with getting MRI of the head and neck.  They agree with admission to [facility] although they suspect that there will not need to be any neurosurgical intervention at this time unless the neck shows concerning findings on MRI.  They recommend CT tomorrow to make sure no worsened bleeding.  Will call for admission to [facility] for further management.  Will order MRIs as well.  Will order headache cocktail for his pain as well.         Final Clinical Impression(s) / ED Diagnoses Final diagnoses:  Fall, initial encounter  Cerebrovascular accident (CVA), unspecified mechanism (HCC)  Closed nondisplaced fracture of sixth cervical vertebra, unspecified fracture morphology, initial encounter (HCC)  Traumatic intracerebral hemorrhage with unknown loss of consciousness status, unspecified laterality, initial encounter (HCC)    Clinical Impression: 1. Fall, initial encounter   2. Cerebrovascular accident (CVA), unspecified mechanism (HCC)   3. Closed nondisplaced fracture of sixth cervical vertebra, unspecified  fracture morphology, initial encounter (HCC)   4. Traumatic intracerebral hemorrhage with unknown loss of consciousness status, unspecified laterality, initial encounter [facility])     Disposition: Admit  This note was prepared with assistance of Dragon voice recognition software. Occasional wrong-word or sound-a-like substitutions may have occurred due to the inherent limitations of voice recognition software.     Naijah Lacek, Canary Brim, MD 05/23/23 [phone]

## 2023-05-22 NOTE — ED Notes (Signed)
Even after IV ativan, pt still appears partially agitated, unable to go back to MRI, Dr. Arlean Hopping was at bedside, spoke with son about transfer to [facility], son agrees and understand will probably need to wait to transfer at [facility] then go to MRI d/t pt's agitation.

## 2023-05-22 NOTE — H&P (Signed)
History and Physical      [name]:[id number] DOB: 13-Jan-1941 DOA: 05/22/2023; DOS: 05/22/2023  PCP: Tresa Garter, MD  Patient coming from: home   I have personally briefly reviewed patient's old medical records in [facility]  Chief Complaint: fall  HPI: Eric [name] is a 82 y.o. male with medical history significant for stroke, essential pretension, who is admitted to [facility] on 05/22/2023 with suspected acute versus subacute ischemic stroke after presenting from home to [facility] ED for evaluation of fall.  In the setting of the patient's altered mental status, the following history is provided by the patient's son, who is present at bedside, in addition to my discussions with the EDP and via chart review.  Son conveys that the patient has been confused relative to his baseline mental status starting around 2100 on 05/19/2023.  This prompted the patient to present to [facility] emergency department overnight, between 9/16 and 9/17.  However, due to reported prolonged wait, the patient left the ED prior to being seen.   Today he presents to [facility] emergency department after a mechanical ground-level fall at home, which is reported that the patient tripped while attempting to ambulate.  He hit the front portion of his head as a component of this fall.  Son does not believe that the patient experienced loss of consciousness associated with this fall.  The patient reportedly has been complaining of some numbness involving the right upper lower extremity, but is unable to convey the specific timeframe over which she has been experiencing this, with the patient repeatedly reporting that he has been experiencing this right-sided numbness for "awhile".   He has a documented history of previous stroke.  Medical history notable for essential hypertension, will the patient is also noted to be a former smoker, having completely quit smoking in 2001.  No  reported history of underlying hyperlipidemia or diabetes, noting most recent hemoglobin A1c of 5.8% when checked on 02/23/2023.  Additionally, no reported history of paroxysmal atrial fibrillation or obstructive sleep apnea.  He is on a daily baby aspirin at home, but otherwise on no additional blood thinners.  In the setting of a previous stroke, he is also reported to be on rosuvastatin.    ED Course:  Vital signs in the ED were notable for the following: Afebrile; heart rates in the low 100s; presenting blood pressure 165/95; respiratory rate 20, oxygen saturation 94% on room air.  Labs were notable for the following: CMP notable for the following: Sodium 136, creatinine will 0.97, glucose 114, liver enzymes within normal limits.  High-sensitivity troponin I initially noted to be 20 with repeat value trending up slightly to 28, relative to most recent prior high sensitive troponin I value of 15 on 02/23/2023.  CBC notable for white blood cell count 9800, hemoglobin 14.1, platelet count 317.  Urinalysis has been ordered, Therazole currently pending.  Per my interpretation, EKG in ED demonstrated the following: Sinus tachycardia with heart rate 103, normal intervals, no evidence of T wave or ST changes, including no evidence of ST elevation.  Imaging in the ED, per corresponding formal radiology read, was notable for the following: Noncontrast CT head was suggestive of acute to subacute cortical infarct in the left parietal lobe, which is new since CT head was performed on 02/22/2023.  Additionally, today CT head shows a 0.9 cm focus of intraparenchymal hemorrhage in the right frontal lobe, without associated mass effect or midline shift.  Also, CT cervical spine shows acute nondisplaced fracture through the anterior inferior corner of the C6 vertebral body with associated prevertebral soft tissue swelling.  EDP discussed patient's case with on-call neurosurgery, Patrici Ranks, PA, who is on-call  for Dr. Maisie Fus, who recommended cervical collar with MRI of C-spine for the C6 fx, and also recommended repeat CT head in the AM for the small intracranial bleed, conveying no indication for urgent overnight neurosurgical involvement. Also, EDP d/w on-call neurology, Dr. Otelia Limes, who recommended TRH admit to [facility] for further evaluation of acute vs subacute ischemic stroke. Dr. Otelia Limes recommended MRI-brain and conveyed that neurology will formally consult at [facility].   While in the ED, the following were administered: Normal saline x 1 L bolus, fentanyl 50 mcg IV x 1 dose, Benadryl 50 mg IV x 1, Compazine 10 mg IV x 1.  Subsequently, the patient was admitted to [facility] for further evaluation and management of suspected acute versus subacute ischemic stroke, along with acute intracranial hemorrhage, acute C6 fracture after presenting with acute encephalopathy and mechanical ground-level fall.     Review of Systems: As per HPI otherwise 10 point review of systems negative.   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 His smoking use included cigarettes. He has never used smokeless tobacco. He reports that he does not drink alcohol and does not use drugs.   Allergies  Allergen Reactions   Hctz [Hydrochlorothiazide] Other (See Comments)    "Peeing too much"    Family History  Problem Relation Age of Onset   Arthritis Mother    Diabetes Father    Diabetes Other    Hypertension Other     Family history reviewed and not pertinent    Prior to Admission medications    Medication Sig Start Date End Date Taking? Authorizing Provider  acetaminophen (TYLENOL) 650 MG CR tablet Take 650 mg by mouth at bedtime as needed (for back pain). For arthritis in back   Yes [provider]  amLODipine-benazepril (LOTREL) 5-20 MG capsule TAKE 1 CAPSULE BY MOUTH EVERY DAY Patient taking differently: Take 1 capsule by mouth at bedtime. 10/28/22  Yes Plotnikov, Georgina Quint, MD  BAYER LOW DOSE 81 MG tablet Take 81 mg by mouth in the morning. Swallow whole.   Yes [provider]  Cyanocobalamin (VITAMIN B-12) 1000 MCG SUBL Place 1 tablet (1,000 mcg total) under the tongue daily. Patient taking differently: Place 1 tablet under the tongue at bedtime. 04/24/21  Yes Plotnikov, Georgina Quint, MD  ferrous sulfate 325 (65 FE) MG tablet Take 1 tablet (325 mg total) by mouth 2 (two) times daily with a meal. Patient taking differently: Take 325 mg by mouth at bedtime. 12/29/22 12/29/23 Yes Plotnikov, Georgina Quint, MD  finasteride (PROSCAR) 5 MG tablet Take 1 tablet (5 mg total) by mouth daily. 06/26/22  Yes Plotnikov, Georgina Quint, MD  oxybutynin (DITROPAN-XL) 5 MG 24 hr tablet TAKE 1 TABLET (5 MG TOTAL) BY MOUTH AT BEDTIME. FOR FREQUENT  URINATION 05/14/23  Yes Plotnikov, Georgina Quint, MD  potassium chloride SA (KLOR-CON M) 20 MEQ tablet Take 1 tablet (20 mEq total) by mouth daily. 04/18/23  Yes Plotnikov, Georgina Quint, MD  rosuvastatin (CRESTOR) 20 MG tablet Take 1 tablet (20 mg total) by mouth daily. 02/25/23  Yes Rai, Ripudeep K, MD  traMADol (ULTRAM) 50 MG tablet TAKE 1 TABLET 3 TIMES A DAY AS NEEDED SEVERE PAIN Patient taking differently: Take 50 mg by mouth 3 (three) times daily. 02/14/23  Yes Plotnikov, Georgina Quint, MD  aspirin EC 81 MG tablet Take 1 tablet (81 mg total) by mouth daily. Swallow whole. Patient not taking: Reported on 05/22/2023 02/25/23   Rai, Delene Ruffini, MD  pantoprazole (PROTONIX) 40 MG tablet Take 1 tablet (40 mg total) by mouth daily. 02/24/23   Cathren Harsh, MD      Objective    Physical Exam: Vitals:   05/22/23 2004 05/22/23 2130 05/22/23 2200 05/22/23 2300  BP:  (!) 173/108 (!) 157/108 (!) 144/91  Pulse:  92 91 97  Resp:  18 19 (!) 21  Temp:      TempSrc:      SpO2:  100% 96% 100%  Weight: 61 kg     Height: 5\' 8"  (1.727 m)       General: appears to be stated age; agitated, confused.  Skin: warm, dry, no rash Head:  AT/[place] Mouth:  Oral mucosa membranes appear moist, normal dentition Neck: supple; trachea midline Heart:  RRR; did not appreciate any M/R/G Lungs: CTAB, did not appreciate any wheezes, rales, or rhonchi Abdomen: + BS; soft, ND, NT Vascular: 2+ pedal pulses b/l; 2+ radial pulses b/l Extremities: no peripheral edema, no muscle wasting Neuro: In the setting of the patient's current mental status and associated inability to follow instructions, unable to perform full neurologic exam at this time.  As such, assessment of strength, sensation, and cranial nerves is limited at this time. Patient noted to spontaneously move all 4 extremities. No tremors.      Labs on Admission: I have personally reviewed following labs and imaging studies  CBC: Recent Labs  Lab 05/20/23 0124 05/22/23 2024  WBC 11.3* 9.8  HGB 13.6 14.1  HCT 41.0 42.5  MCV 81.3 81.7  PLT 326 317   Basic Metabolic Panel: Recent Labs  Lab 05/20/23 0124 05/22/23 2024  NA 136 136  K 4.2 3.8  CL 101 102  CO2 25 23  GLUCOSE 128* 114*  BUN 10 9  CREATININE 1.16 0.97  CALCIUM 9.6 9.6   GFR: Estimated Creatinine Clearance: 50.7 mL/min (by C-G formula based on SCr of 0.97 mg/dL). Liver Function Tests: Recent Labs  Lab 05/22/23 2024  AST 30  ALT 27  ALKPHOS 60  BILITOT 1.2  PROT 8.1  ALBUMIN 4.3   No results for input(s): "LIPASE", "AMYLASE" in the last 168 hours. No results for input(s): "AMMONIA" in the last 168 hours. Coagulation Profile: No results for input(s): "INR", "PROTIME" in the last 168 hours. Cardiac Enzymes: No results for  input(s): "CKTOTAL", "CKMB", "CKMBINDEX", "TROPONINI" in the last 168 hours. BNP (last 3 results) No results for input(s): "PROBNP" in the last 8760 hours. HbA1C: No results for input(s): "HGBA1C" in the last 72 hours. CBG: Recent Labs  Lab 05/20/23 0126 05/22/23 2032  GLUCAP 121* 111*   Lipid Profile: No results for input(s): "CHOL", "HDL", "LDLCALC", "TRIG", "CHOLHDL", "LDLDIRECT" in the last 72 hours. Thyroid Function Tests: No results for input(s): "TSH", "T4TOTAL", "FREET4", "T3FREE", "  THYROIDAB" in the last 72 hours. Anemia Panel: No results for input(s): "VITAMINB12", "FOLATE", "FERRITIN", "TIBC", "IRON", "RETICCTPCT" in the last 72 hours. Urine analysis:    Component Value Date/Time   COLORURINE STRAW (A) 02/23/2023 0020   APPEARANCEUR CLEAR 02/23/2023 0020   LABSPEC 1.020 02/23/2023 0020   PHURINE 7.0 02/23/2023 0020   GLUCOSEU NEGATIVE 02/23/2023 0020   GLUCOSEU NEGATIVE 06/26/2022 0934   HGBUR NEGATIVE 02/23/2023 0020   HGBUR negative 06/01/2010 1023   BILIRUBINUR NEGATIVE 02/23/2023 0020   BILIRUBINUR n 08/15/2015 1530   KETONESUR NEGATIVE 02/23/2023 0020   PROTEINUR NEGATIVE 02/23/2023 0020   UROBILINOGEN 0.2 06/26/2022 0934   NITRITE NEGATIVE 02/23/2023 0020   LEUKOCYTESUR NEGATIVE 02/23/2023 0020    Radiological Exams on Admission: CT Head Wo Contrast  Result Date: 05/22/2023 CLINICAL DATA:  Fall, head strike. Pain. Redness and swelling to face. EXAM: CT HEAD WITHOUT CONTRAST CT CERVICAL SPINE WITHOUT CONTRAST TECHNIQUE: Multidetector CT imaging of the head and cervical spine was performed following the standard protocol without intravenous contrast. Multiplanar CT image reconstructions of the cervical spine were also generated. RADIATION DOSE REDUCTION: This exam was performed according to the departmental dose-optimization program which includes automated exposure control, adjustment of the mA and/or kV according to patient size and/or use of iterative  reconstruction technique. COMPARISON:  CT head 02/22/2023, CTA head/neck 02/22/2023. FINDINGS: CT HEAD FINDINGS Brain: There is a 9 mm focus of intraparenchymal hemorrhage in the right frontal lobe with mild surrounding edema and apparent loss of gray-white differentiation (3-16). There is no mass effect. There is mild swelling in the overlying scalp. There is no other evidence of acute intracranial hemorrhage. There is no evidence of acute territorial infarct. Extensive infarcts are again seen throughout the bilateral cerebral hemispheres. Infarct in the left parietal lobe is new since the prior study and may be acute (3-22, 6-50). The other cortical infarcts with associated encephalomalacia both cerebral hemispheres, as well as smaller infarcts in the bilateral basal ganglia and left cerebellar hemisphere are unchanged. The ventricles are stable in size. There is no intraventricular hemorrhage. The pituitary and suprasellar region are normal. There is no mass lesion. There is no midline shift. Vascular: There is calcification of the bilateral carotid siphons. Skull: Normal. Negative for fracture or focal lesion. Sinuses/Orbits: The paranasal sinuses are clear. Bilateral lens implants are in place. The globes and orbits are otherwise unremarkable. Other: The mastoid air cells and middle ear cavities are clear. There is mild right frontal scalp swelling. CT CERVICAL SPINE FINDINGS Alignment: There is slight reversal of the normal cervical curvature with trace stepwise retrolisthesis of C3 on C4 through C5 on C6, likely degenerative in nature. There is no jumped or perched facet or other evidence of traumatic malalignment. Skull base and vertebrae: Skull base alignment is maintained. Vertebral body heights are preserved. There is an acute fracture through the anteroinferior corner of the C6 vertebral body (7-34). There is no other evidence of acute fracture. There is no suspicious osseous lesion. Soft tissues and  spinal canal: There is prevertebral swelling anterior to the C6 fracture. There is no visible canal hematoma. Disc levels: There is moderate disc space narrowing and degenerative endplate change and mild facet arthropathy throughout the cervical spine. There is no evidence of high-grade spinal canal stenosis. Upper chest: The imaged lung apices are clear. Other: None. IMPRESSION: 1. 0.9 cm focus of intraparenchymal hemorrhage in the right frontal lobe is favored to reflect a small hemorrhagic contusion given overlying scalp swelling. Differential  includes hemorrhagic transformation of a subacute infarct. No significant mass effect. 2. Suspect acute to subacute cortical infarct in the left parietal lobe, new since the CT head from 02/22/2023. Multiple additional remote infarcts in the cerebral and cerebellar hemispheres are unchanged. 3. Acute nondisplaced fracture through the antero inferior corner of the C6 vertebral body with associated prevertebral soft tissue swelling. Consider MRI to evaluate for ligamentous injury as indicated. Critical Value/emergent results were called by telephone at the time of interpretation on 05/22/2023 at 8:51 pm to provider Dr Webb Silversmith, who verbally acknowledged these results. Electronically Signed   By: Lesia Hausen M.D.   On: 05/22/2023 20:53   CT Cervical Spine Wo Contrast  Result Date: 05/22/2023 CLINICAL DATA:  Fall, head strike. Pain. Redness and swelling to face. EXAM: CT HEAD WITHOUT CONTRAST CT CERVICAL SPINE WITHOUT CONTRAST TECHNIQUE: Multidetector CT imaging of the head and cervical spine was performed following the standard protocol without intravenous contrast. Multiplanar CT image reconstructions of the cervical spine were also generated. RADIATION DOSE REDUCTION: This exam was performed according to the departmental dose-optimization program which includes automated exposure control, adjustment of the mA and/or kV according to patient size and/or use of iterative  reconstruction technique. COMPARISON:  CT head 02/22/2023, CTA head/neck 02/22/2023. FINDINGS: CT HEAD FINDINGS Brain: There is a 9 mm focus of intraparenchymal hemorrhage in the right frontal lobe with mild surrounding edema and apparent loss of gray-white differentiation (3-16). There is no mass effect. There is mild swelling in the overlying scalp. There is no other evidence of acute intracranial hemorrhage. There is no evidence of acute territorial infarct. Extensive infarcts are again seen throughout the bilateral cerebral hemispheres. Infarct in the left parietal lobe is new since the prior study and may be acute (3-22, 6-50). The other cortical infarcts with associated encephalomalacia both cerebral hemispheres, as well as smaller infarcts in the bilateral basal ganglia and left cerebellar hemisphere are unchanged. The ventricles are stable in size. There is no intraventricular hemorrhage. The pituitary and suprasellar region are normal. There is no mass lesion. There is no midline shift. Vascular: There is calcification of the bilateral carotid siphons. Skull: Normal. Negative for fracture or focal lesion. Sinuses/Orbits: The paranasal sinuses are clear. Bilateral lens implants are in place. The globes and orbits are otherwise unremarkable. Other: The mastoid air cells and middle ear cavities are clear. There is mild right frontal scalp swelling. CT CERVICAL SPINE FINDINGS Alignment: There is slight reversal of the normal cervical curvature with trace stepwise retrolisthesis of C3 on C4 through C5 on C6, likely degenerative in nature. There is no jumped or perched facet or other evidence of traumatic malalignment. Skull base and vertebrae: Skull base alignment is maintained. Vertebral body heights are preserved. There is an acute fracture through the anteroinferior corner of the C6 vertebral body (7-34). There is no other evidence of acute fracture. There is no suspicious osseous lesion. Soft tissues and  spinal canal: There is prevertebral swelling anterior to the C6 fracture. There is no visible canal hematoma. Disc levels: There is moderate disc space narrowing and degenerative endplate change and mild facet arthropathy throughout the cervical spine. There is no evidence of high-grade spinal canal stenosis. Upper chest: The imaged lung apices are clear. Other: None. IMPRESSION: 1. 0.9 cm focus of intraparenchymal hemorrhage in the right frontal lobe is favored to reflect a small hemorrhagic contusion given overlying scalp swelling. Differential includes hemorrhagic transformation of a subacute infarct. No significant mass effect. 2. Suspect acute to  subacute cortical infarct in the left parietal lobe, new since the CT head from 02/22/2023. Multiple additional remote infarcts in the cerebral and cerebellar hemispheres are unchanged. 3. Acute nondisplaced fracture through the antero inferior corner of the C6 vertebral body with associated prevertebral soft tissue swelling. Consider MRI to evaluate for ligamentous injury as indicated. Critical Value/emergent results were called by telephone at the time of interpretation on 05/22/2023 at 8:51 pm to provider Dr Webb Silversmith, who verbally acknowledged these results. Electronically Signed   By: Lesia Hausen M.D.   On: 05/22/2023 20:53      Assessment/Plan   Principal Problem:   Acute ischemic stroke [facility]) Active Problems:   Essential hypertension   Acute intracranial hemorrhage (HCC)   Acute encephalopathy   C6 cervical fracture (HCC)   Elevated troponin   BPH (benign prostatic hyperplasia)     #) Acute ischemic stroke: In the setting of presenting confusion starting around 2100 on 05/19/2023 as well as right sided numbness of unclear duration, presenting CT head is suggestive of acute to subacute cortical infarct in the left parietal lobe, which appears new relative to CT head from 02/22/2023.  EDP d/w on-call neurology, Dr. Otelia Limes, who recommended TRH  admit to [facility] for further evaluation of acute vs subacute ischemic stroke. Dr. Otelia Limes recommended MRI-brain and conveyed that neurology will formally consult at [facility].   As the duration of his recent numbness is unclear and his confusion started greater than 72 hours ago, also noting small intracranial bleed, he is not a candidate for either tPA or thrombectomy.  Also appears that he is outside of the window for permissive hypertension.  He has a reported history of prior stroke, and has modifiable CV risk factors that include a history of essential hypertension.  Is also noted to be a former smoker.  Will refrain from repeating hemoglobin A1c level, given hemoglobin A1c check on 02/23/2023, at which time this value was noted to be 5.8%.  Current outpatient antiplatelet/anticoagulant regimen: Daily baby aspirin. Will follow for neuro recommendations regarding antiplatelet therapy. Current outpatient anti-lipid regimen: Rosuvastatin 20 mg p.o. daily.    Plan: Nursing bedside swallow evaluation x 1 now, and will not initiate oral medications or diet until the patient has passed this. Head of the bed at 30 degrees. Neuro checks per protocol. VS per protocol. Monitor on telemetry, including monitoring for atrial fibrillation as modifiable risk factor for acute ischemic CVA.   MRI brain. TTE without  bubble study has been ordered for the morning. Additionally, as component of evaluation of potential modifiable ischemic CVA risk factors, will also check lipid panel. PT/OT/ST consults have been ordered for the morning.  Neurology to consult, including for recommendations regarding CTA head and neck.                 #) Acute intracranial hemorrhage: Presenting CT head shows a 0.9 cm focus of intraparenchymal hemorrhage in the right frontal lobe without evidence of corresponding mass effect or midline shift.  Differential includes hemorrhagic contusion as the patient struck his forehead as a component of  mechanical fall at home earlier today versus hemorrhagic conversion of acute ischemic infarct.   EDP d/w on-call neurosurgery who recommended repeat CT head in the AM and convey no indication for urgent overnight neurosurgical involvement.   Of note, the patient is on a daily baby aspirin at home, but otherwise on no additional blood thinners.  Plan: Head of bed at 30 degrees.  Serial neurochecks, as above.  Repeat CT head in the morning, per neurosurgery recommendations.  Refraining from pharmacologic DVT prophylaxis.  SCDs.  Hold home daily baby aspirin. Prn iv labetalol for SBP > 160 mmHg or DBP > 90 mmHg.  CBC in AM.                   #) C6 vertebral body fracture: Following ground-level mechanical fall at home earlier today, today CT cervical spine showed acute nondisplaced fracture through the anterior inferior corner of the C6 vertebral body with associated prevertebral soft tissue swelling.  EDP discussed patient's case with on-call neurosurgery, who recommended hard cervical collar as well as MRI of the cervical spine.  Plan: Per neurosurgery, hard cervical collar has been ordered.  Will also pursue MRI of the cervical spine.  Fall precautions ordered.  Prn IV fentanyl.               #) Acute encephalopathy: 3 days of confusion relative to baseline mental status, with suspicion for contribution from acute ischemic infarct as well as potential contribution from acute intracranial hemorrhage, as above.  No overt evidence of underlying infectious process, but will also check urinalysis and checks x-ray to further evaluate.  Will also expand evaluation for any additional metabolic contributions, as outlined below.  Plan: Further evaluation management of acute versus subacute ischemic stroke as well as acute intracranial hemorrhage, as above.  Check urinalysis, chest x-ray, B12 level, INR, TSH, urinary drug screen.  Delirium and fall precautions ordered.  CMP, CBC in  the morning.                #) Elevated troponin: Very mild elevation in troponin, initial 20, with repeat trending up slightly to 28, relative to most recent prior value of 15 on 02/23/2023.  Son does not believe that the patient had been experiencing any recent chest pain or shortness of breath.  EKG shows no evidence of acute ischemic changes, including no evidence of ST elevation.  Overall, ACS is felt to be less likely, but will continue to trend troponin and follow-up result of echocardiogram, which is also been ordered as a component of evaluation of presenting acute versus subacute ischemic stroke, as above.  Plan: Check chest x-ray.  Trend troponin.  Follow-up result echocardiogram in the morning.  Monitor on telemetry.  Repeat CMP and serum magnesium level in the morning.                 #) Benign Prostatic Hyperplasia:  documented h/o such; on Proscar as outpatient.   Plan: monitor strict I's & O's and daily weights. Repeat CMP in AM.  Holding home Proscar for now until patient able to pass nursing bedside swallow screen in the setting of presenting acute versus subacute ischemic stroke.                 #) Essential Hypertension: documented h/o such, with outpatient antihypertensive regimen including amlodipine, benazepril.  SBP's in the ED today: 160s mmHg.   Plan: Close monitoring of subsequent BP via routine VS. holding home oral antihypertensive medications until patient able to pass nursing bedside swallow screen.  Prn IV labetalol, as above.      DVT prophylaxis: SCD's   Code Status: Full code Family Communication: I discussed patient's case with his son, who was present at beside.  Disposition Plan: Per Rounding Team Consults called: EDP d/w on-call neurology as well as on-call neurosurgery, as further detailed above;  Admission status: Inpatient to [facility]  I SPENT GREATER THAN 75  MINUTES IN CLINICAL CARE TIME/MEDICAL  DECISION-MAKING IN COMPLETING THIS ADMISSION.      Chaney Born Rasheena Talmadge DO [facility]  From 7PM - 7AM   05/22/2023, 11:20 PM

## 2023-05-22 NOTE — ED Provider Triage Note (Signed)
Emergency Medicine Provider Triage Evaluation Note  TSHOMBE DOOLIN , a 82 y.o. male  was evaluated in triage.  Pt brought in by son after fall. States patient has been more confused for several days, went to ED the other day and left due to wait times. This evening patient fell forwards, striking his face on the ground. Not sure about LOC, son was not present, pt states he did not lose consciousness. However, son states while driving the patient was confused and tried to change gears of the car.   Per chart review, not on blood thinners  Review of Systems  Positive: Head injury Negative:   Physical Exam  BP (!) 165/95 (BP Location: Left Arm)   Pulse (!) 110   Temp 98.7 F (37.1 C) (Oral)   Resp 20   Ht 5\' 8"  (1.727 m)   Wt 61 kg   SpO2 94%   BMI 20.45 kg/m  Gen:   Awake, no distress   Resp:  Normal effort  MSK:   Moves extremities without difficulty  Other:  Contusion noted to the forehead, chest and pelvis stable, no cervical midline tenderness  Medical Decision Making  Medically screening exam initiated at 8:05 PM.  Appropriate orders placed.  [name] was informed that the remainder of the evaluation will be completed by another provider, this initial triage assessment does not replace that evaluation, and the importance of remaining in the ED until their evaluation is complete.  AMS workup initiated including CT head and CT cervical spine   Carlei Huang T, PA-C 05/22/23 2005

## 2023-05-22 NOTE — ED Notes (Signed)
Unable to collect labs at this time patient is with xray I will collect when patient return

## 2023-05-22 NOTE — H&P (Incomplete)
History and Physical      [name]:[id number] DOB: 1940-11-15 DOA: 05/22/2023; DOS: 05/22/2023  PCP: Eric Garter, [name]  Patient coming from: home   I have personally briefly reviewed patient's old medical records in [name]  Chief Complaint: fall  HPI: [name] is a 82 y.o. male with medical history significant for stroke, essential pretension, who is admitted to [name] on 05/22/2023 with suspected acute versus subacute ischemic stroke after presenting from home to [name] ED for evaluation of fall.  In the setting of the patient's altered mental status, the following history is provided by the patient's son, who is present at bedside, in addition to my discussions with the EDP and via chart review.  Son conveys that the patient has been confused relative to his baseline mental status starting around 2100 on 05/19/2023.  This prompted the patient to present to [name] emergency department overnight, between 9/16 and 9/17.  However, due to reported prolonged wait, the patient left the ED prior to being seen.   Today he presents to [name] emergency department after a mechanical fall at home, which is reported that the patient tripped while attempting to ambulate.  He hit the front portion of his head as a component of this fall.  Son does not believe that the patient experienced loss of consciousness associated with this fall.  The patient reportedly has been complaining of some numbness involving the right upper lower extremity, but is unable to convey the specific timeframe over which she has been experiencing this, with the patient repeatedly reporting that he has been experiencing this right-sided numbness for "awhile".   He has a documented history of previous stroke.  Medical history notable for essential hypertension, will the patient is also noted to be a former smoker, having completely quit smoking in 2001.  No reported  history of underlying hyperlipidemia or diabetes, noting most recent hemoglobin A1c of 5.8% when checked on 02/23/2023.  Additionally, no reported history of paroxysmal atrial fibrillation or obstructive sleep apnea.  He is on a daily baby aspirin at home, but otherwise on no additional blood thinners.  In the setting of a previous stroke, he is also reported to be on rosuvastatin.    ED Course:  Vital signs in the ED were notable for the following: Afebrile; heart rates in the low 100s; presenting blood pressure 165/95; respiratory rate 20, oxygen saturation 94% on room air.  Labs were notable for the following: CMP notable for the following: Sodium 136, creatinine will 0.97, glucose 114, liver enzymes within normal limits.  High-sensitivity troponin I initially noted to be 20 with repeat value trending up slightly to 28, relative to most recent prior high sensitive troponin I value of 15 on 02/23/2023.  CBC notable for white blood cell count 9800, hemoglobin 14.1, platelet count 317.  Urinalysis has been ordered, Therazole currently pending.  Per my interpretation, EKG in ED demonstrated the following: Sinus tachycardia with heart rate 103, normal intervals, no evidence of T wave or ST changes, including no evidence of ST elevation.  Imaging in the ED, per corresponding formal radiology read, was notable for the following: Noncontrast CT head was suggestive of acute to subacute cortical infarct in the left parietal lobe, which is new since CT head was performed on 02/22/2023.  Additionally, today CT head shows a 0.9 cm focus of intraparenchymal hemorrhage in the right frontal lobe, without associated mass effect or midline shift.  Also,  EDP discussed patient's case with on-call neurosurgery, Eric Ranks, PA, who is on-call for [name], who recommended cervical collar with MRI of C-spine for the C6 fx, and also recommended repeat CT head in the AM for the small intracranial bleed, conveying no  indication for urgent overnight neurosurgical involvement. Also, EDP d/w on-call neurology, [name], who recommended TRH admit to [name] for further evaluation of acute vs subacute ischemic stroke. [name] recommended MRI-brain and conveyed that neurology will formally consult at [name].   While in the ED, the following were administered: ***  Subsequently, the patient was admitted  ***  ***red    Review of Systems: As per HPI otherwise 10 point review of systems negative.   Past Medical History:  Diagnosis Date  . Benign prostatic hypertrophy   . COPD (chronic obstructive pulmonary disease) (HCC)    in past  . Difficulty reading    and writing  . ED (erectile dysfunction)   . 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 Hypertension   . Stroke [name]) 06/2014    Past Surgical History:  Procedure Laterality Date  . bilateral breast removal    . COLONOSCOPY  04/24/05    Social History:  reports that he quit smoking about 23 years ago. His smoking use included cigarettes. He has never used smokeless tobacco. He reports that he does not drink alcohol and does not use drugs.   Allergies  Allergen Reactions  . Hctz [Hydrochlorothiazide] Other (See Comments)    "Peeing too much"    Family History  Problem Relation Age of Onset  . Arthritis Mother   . Diabetes Father   . Diabetes Other   . Hypertension Other     Family history reviewed and not pertinent ***   Prior to Admission medications   Medication Sig Start Date End Date Taking? Authorizing Provider  acetaminophen (TYLENOL) 650 MG CR tablet Take 650 mg by mouth at bedtime as needed (for back pain). For arthritis in back   Yes [name]  amLODipine-benazepril (LOTREL) 5-20 MG capsule TAKE 1 CAPSULE BY MOUTH EVERY DAY Patient taking differently: Take 1 capsule by mouth at bedtime. 10/28/22  Yes [name]  BAYER LOW DOSE 81 MG tablet Take 81 mg by mouth in the morning. Swallow whole.    Yes [name]  Cyanocobalamin (VITAMIN B-12) 1000 MCG SUBL Place 1 tablet (1,000 mcg total) under the tongue daily. Patient taking differently: Place 1 tablet under the tongue at bedtime. 04/24/21  Yes [name]  ferrous sulfate 325 (65 FE) MG tablet Take 1 tablet (325 mg total) by mouth 2 (two) times daily with a meal. Patient taking differently: Take 325 mg by mouth at bedtime. 12/29/22 12/29/23 Yes [name]  finasteride (PROSCAR) 5 MG tablet Take 1 tablet (5 mg total) by mouth daily. 06/26/22  Yes [name]  oxybutynin (DITROPAN-XL) 5 MG 24 hr tablet TAKE 1 TABLET (5 MG TOTAL) BY MOUTH AT BEDTIME. FOR FREQUENT URINATION 05/14/23  Yes [name]  potassium chloride SA (KLOR-CON M) 20 MEQ tablet Take 1 tablet (20 mEq total) by mouth daily. 04/18/23  Yes [name]  rosuvastatin (CRESTOR) 20 MG tablet Take 1 tablet (20 mg total) by mouth daily. 02/25/23  Yes Rai, Ripudeep K, [name]  traMADol (ULTRAM) 50 MG tablet TAKE 1 TABLET 3 TIMES A DAY AS NEEDED SEVERE PAIN Patient taking differently: Take  50 mg by mouth 3 (three) times daily. 02/14/23  Yes [name]  aspirin EC 81 MG tablet Take 1 tablet (81 mg total) by mouth daily. Swallow whole. Patient not taking: Reported on 05/22/2023 02/25/23   Rai, Delene Ruffini, [name]  pantoprazole (PROTONIX) 40 MG tablet Take 1 tablet (40 mg total) by mouth daily. 02/24/23   Cathren Harsh, [name]     Objective    Physical Exam: Vitals:   05/22/23 2004 05/22/23 2130 05/22/23 2200 05/22/23 2300  BP:  (!) 173/108 (!) 157/108 (!) 144/91  Pulse:  92 91 97  Resp:  18 19 (!) 21  Temp:      TempSrc:      SpO2:  100% 96% 100%  Weight: 61 kg     Height: 5\' 8"  (1.727 m)       General: appears to be stated age; alert, oriented Skin: warm, dry, no rash Head:  AT/[place] Mouth:  Oral mucosa membranes appear moist, normal dentition Neck: supple; trachea midline Heart:  RRR; did not  appreciate any M/R/G Lungs: CTAB, did not appreciate any wheezes, rales, or rhonchi Abdomen: + BS; soft, ND, NT Vascular: 2+ pedal pulses b/l; 2+ radial pulses b/l Extremities: no peripheral edema, no Eric wasting Neuro: strength and sensation intact in upper and lower extremities b/l ***   *** Neuro: 5/5 strength of the proximal and distal flexors and extensors of the upper and lower extremities bilaterally; sensation intact in upper and lower extremities b/l; cranial nerves II through XII grossly intact; no pronator drift; no evidence suggestive of slurred speech, dysarthria, or facial droop; Normal Eric tone. No tremors.  *** Neuro: In the setting of the patient's current mental status and associated inability to follow instructions, unable to perform full neurologic exam at this time.  As such, assessment of strength, sensation, and cranial nerves is limited at this time. Patient noted to spontaneously move all 4 extremities. No tremors.  ***    Labs on Admission: I have personally reviewed following labs and imaging studies  CBC: Recent Labs  Lab 05/20/23 0124 05/22/23 2024  WBC 11.3* 9.8  HGB 13.6 14.1  HCT 41.0 42.5  MCV 81.3 81.7  PLT 326 317   Basic Metabolic Panel: Recent Labs  Lab 05/20/23 0124 05/22/23 2024  NA 136 136  K 4.2 3.8  CL 101 102  CO2 25 23  GLUCOSE 128* 114*  BUN 10 9  CREATININE 1.16 0.97  CALCIUM 9.6 9.6   GFR: Estimated Creatinine Clearance: 50.7 mL/min (by C-G formula based on SCr of 0.97 mg/dL). Liver Function Tests: Recent Labs  Lab 05/22/23 2024  AST 30  ALT 27  ALKPHOS 60  BILITOT 1.2  PROT 8.1  ALBUMIN 4.3   No results for input(s): "LIPASE", "AMYLASE" in the last 168 hours. No results for input(s): "AMMONIA" in the last 168 hours. Coagulation Profile: No results for input(s): "INR", "PROTIME" in the last 168 hours. Cardiac Enzymes: No results for input(s): "CKTOTAL", "CKMB", "CKMBINDEX", "TROPONINI" in the last 168  hours. BNP (last 3 results) No results for input(s): "PROBNP" in the last 8760 hours. HbA1C: No results for input(s): "HGBA1C" in the last 72 hours. CBG: Recent Labs  Lab 05/20/23 0126 05/22/23 2032  GLUCAP 121* 111*   Lipid Profile: No results for input(s): "CHOL", "HDL", "LDLCALC", "TRIG", "CHOLHDL", "LDLDIRECT" in the last 72 hours. Thyroid Function Tests: No results for input(s): "TSH", "T4TOTAL", "FREET4", "T3FREE", "THYROIDAB" in the last 72 hours. Anemia Panel: No  results for input(s): "VITAMINB12", "FOLATE", "FERRITIN", "TIBC", "IRON", "RETICCTPCT" in the last 72 hours. Urine analysis:    Component Value Date/Time   COLORURINE STRAW (A) 02/23/2023 0020   APPEARANCEUR CLEAR 02/23/2023 0020   LABSPEC 1.020 02/23/2023 0020   PHURINE 7.0 02/23/2023 0020   GLUCOSEU NEGATIVE 02/23/2023 0020   GLUCOSEU NEGATIVE 06/26/2022 0934   HGBUR NEGATIVE 02/23/2023 0020   HGBUR negative 06/01/2010 1023   BILIRUBINUR NEGATIVE 02/23/2023 0020   BILIRUBINUR n 08/15/2015 1530   KETONESUR NEGATIVE 02/23/2023 0020   PROTEINUR NEGATIVE 02/23/2023 0020   UROBILINOGEN 0.2 06/26/2022 0934   NITRITE NEGATIVE 02/23/2023 0020   LEUKOCYTESUR NEGATIVE 02/23/2023 0020    Radiological Exams on Admission: CT Head Wo Contrast  Result Date: 05/22/2023 CLINICAL DATA:  Fall, head strike. Pain. Redness and swelling to face. EXAM: CT HEAD WITHOUT CONTRAST CT CERVICAL SPINE WITHOUT CONTRAST TECHNIQUE: Multidetector CT imaging of the head and cervical spine was performed following the standard protocol without intravenous contrast. Multiplanar CT image reconstructions of the cervical spine were also generated. RADIATION DOSE REDUCTION: This exam was performed according to the departmental dose-optimization program which includes automated exposure control, adjustment of the mA and/or kV according to patient size and/or use of iterative reconstruction technique. COMPARISON:  CT head 02/22/2023, CTA head/neck  02/22/2023. FINDINGS: CT HEAD FINDINGS Brain: There is a 9 mm focus of intraparenchymal hemorrhage in the right frontal lobe with mild surrounding edema and apparent loss of gray-white differentiation (3-16). There is no mass effect. There is mild swelling in the overlying scalp. There is no other evidence of acute intracranial hemorrhage. There is no evidence of acute territorial infarct. Extensive infarcts are again seen throughout the bilateral cerebral hemispheres. Infarct in the left parietal lobe is new since the prior study and may be acute (3-22, 6-50). The other cortical infarcts with associated encephalomalacia both cerebral hemispheres, as well as smaller infarcts in the bilateral basal ganglia and left cerebellar hemisphere are unchanged. The ventricles are stable in size. There is no intraventricular hemorrhage. The pituitary and suprasellar region are normal. There is no mass lesion. There is no midline shift. Vascular: There is calcification of the bilateral carotid siphons. Skull: Normal. Negative for fracture or focal lesion. Sinuses/Orbits: The paranasal sinuses are clear. Bilateral lens implants are in place. The globes and orbits are otherwise unremarkable. Other: The mastoid air cells and middle ear cavities are clear. There is mild right frontal scalp swelling. CT CERVICAL SPINE FINDINGS Alignment: There is slight reversal of the normal cervical curvature with trace stepwise retrolisthesis of C3 on C4 through C5 on C6, likely degenerative in nature. There is no jumped or perched facet or other evidence of traumatic malalignment. Skull base and vertebrae: Skull base alignment is maintained. Vertebral body heights are preserved. There is an acute fracture through the anteroinferior corner of the C6 vertebral body (7-34). There is no other evidence of acute fracture. There is no suspicious osseous lesion. Soft tissues and spinal canal: There is prevertebral swelling anterior to the C6 fracture.  There is no visible canal hematoma. Disc levels: There is moderate disc space narrowing and degenerative endplate change and mild facet arthropathy throughout the cervical spine. There is no evidence of high-grade spinal canal stenosis. Upper chest: The imaged lung apices are clear. Other: None. IMPRESSION: 1. 0.9 cm focus of intraparenchymal hemorrhage in the right frontal lobe is favored to reflect a small hemorrhagic contusion given overlying scalp swelling. Differential includes hemorrhagic transformation of a subacute infarct. No significant  mass effect. 2. Suspect acute to subacute cortical infarct in the left parietal lobe, new since the CT head from 02/22/2023. Multiple additional remote infarcts in the cerebral and cerebellar hemispheres are unchanged. 3. Acute nondisplaced fracture through the antero inferior corner of the C6 vertebral body with associated prevertebral soft tissue swelling. Consider MRI to evaluate for ligamentous injury as indicated. Critical Value/emergent results were called by telephone at the time of interpretation on 05/22/2023 at 8:51 pm to provider Dr Webb Silversmith, who verbally acknowledged these results. Electronically Signed   By: Lesia Hausen M.D.   On: 05/22/2023 20:53   CT Cervical Spine Wo Contrast  Result Date: 05/22/2023 CLINICAL DATA:  Fall, head strike. Pain. Redness and swelling to face. EXAM: CT HEAD WITHOUT CONTRAST CT CERVICAL SPINE WITHOUT CONTRAST TECHNIQUE: Multidetector CT imaging of the head and cervical spine was performed following the standard protocol without intravenous contrast. Multiplanar CT image reconstructions of the cervical spine were also generated. RADIATION DOSE REDUCTION: This exam was performed according to the departmental dose-optimization program which includes automated exposure control, adjustment of the mA and/or kV according to patient size and/or use of iterative reconstruction technique. COMPARISON:  CT head 02/22/2023, CTA head/neck  02/22/2023. FINDINGS: CT HEAD FINDINGS Brain: There is a 9 mm focus of intraparenchymal hemorrhage in the right frontal lobe with mild surrounding edema and apparent loss of gray-white differentiation (3-16). There is no mass effect. There is mild swelling in the overlying scalp. There is no other evidence of acute intracranial hemorrhage. There is no evidence of acute territorial infarct. Extensive infarcts are again seen throughout the bilateral cerebral hemispheres. Infarct in the left parietal lobe is new since the prior study and may be acute (3-22, 6-50). The other cortical infarcts with associated encephalomalacia both cerebral hemispheres, as well as smaller infarcts in the bilateral basal ganglia and left cerebellar hemisphere are unchanged. The ventricles are stable in size. There is no intraventricular hemorrhage. The pituitary and suprasellar region are normal. There is no mass lesion. There is no midline shift. Vascular: There is calcification of the bilateral carotid siphons. Skull: Normal. Negative for fracture or focal lesion. Sinuses/Orbits: The paranasal sinuses are clear. Bilateral lens implants are in place. The globes and orbits are otherwise unremarkable. Other: The mastoid air cells and middle ear cavities are clear. There is mild right frontal scalp swelling. CT CERVICAL SPINE FINDINGS Alignment: There is slight reversal of the normal cervical curvature with trace stepwise retrolisthesis of C3 on C4 through C5 on C6, likely degenerative in nature. There is no jumped or perched facet or other evidence of traumatic malalignment. Skull base and vertebrae: Skull base alignment is maintained. Vertebral body heights are preserved. There is an acute fracture through the anteroinferior corner of the C6 vertebral body (7-34). There is no other evidence of acute fracture. There is no suspicious osseous lesion. Soft tissues and spinal canal: There is prevertebral swelling anterior to the C6 fracture.  There is no visible canal hematoma. Disc levels: There is moderate disc space narrowing and degenerative endplate change and mild facet arthropathy throughout the cervical spine. There is no evidence of high-grade spinal canal stenosis. Upper chest: The imaged lung apices are clear. Other: None. IMPRESSION: 1. 0.9 cm focus of intraparenchymal hemorrhage in the right frontal lobe is favored to reflect a small hemorrhagic contusion given overlying scalp swelling. Differential includes hemorrhagic transformation of a subacute infarct. No significant mass effect. 2. Suspect acute to subacute cortical infarct in the left parietal lobe, new  since the CT head from 02/22/2023. Multiple additional remote infarcts in the cerebral and cerebellar hemispheres are unchanged. 3. Acute nondisplaced fracture through the antero inferior corner of the C6 vertebral body with associated prevertebral soft tissue swelling. Consider MRI to evaluate for ligamentous injury as indicated. Critical Value/emergent results were called by telephone at the time of interpretation on 05/22/2023 at 8:51 pm to provider Dr Webb Silversmith, who verbally acknowledged these results. Electronically Signed   By: Lesia Hausen M.D.   On: 05/22/2023 20:53      Assessment/Plan   Principal Problem:   Acute ischemic stroke (HCC)   ***            ***                ***                 ***               ***               ***               ***                ***               ***               ***               ***               ***              ***          ***  DVT prophylaxis: SCD's ***  Code Status: Full code*** Family Communication: none*** Disposition Plan: Per Rounding Team Consults called: none***;  Admission status: ***     I SPENT GREATER THAN 75 *** MINUTES  IN CLINICAL CARE TIME/MEDICAL DECISION-MAKING IN COMPLETING THIS ADMISSION.      Chaney Born Marquay Kruse DO [facility]  From 7PM - 7AM   05/22/2023, 11:20 PM   ***

## 2023-05-22 NOTE — ED Notes (Signed)
Has a urinal at bedside

## 2023-05-22 NOTE — ED Notes (Signed)
Patient transported to MRI 

## 2023-05-22 NOTE — ED Triage Notes (Signed)
Pt reports falling and hitting his head and nose when trying to go to the bathroom today. Pt has redness and swelling to his face. Pts son states that he is more confused.

## 2023-05-23 ENCOUNTER — Inpatient Hospital Stay (HOSPITAL_COMMUNITY): Payer: Medicare Other

## 2023-05-23 ENCOUNTER — Encounter (HOSPITAL_COMMUNITY): Payer: Self-pay | Admitting: Internal Medicine

## 2023-05-23 DIAGNOSIS — I639 Cerebral infarction, unspecified: Secondary | ICD-10-CM | POA: Diagnosis not present

## 2023-05-23 DIAGNOSIS — G9340 Encephalopathy, unspecified: Secondary | ICD-10-CM | POA: Diagnosis present

## 2023-05-23 DIAGNOSIS — N400 Enlarged prostate without lower urinary tract symptoms: Secondary | ICD-10-CM | POA: Diagnosis present

## 2023-05-23 DIAGNOSIS — R7989 Other specified abnormal findings of blood chemistry: Secondary | ICD-10-CM | POA: Diagnosis present

## 2023-05-23 DIAGNOSIS — S12500A Unspecified displaced fracture of sixth cervical vertebra, initial encounter for closed fracture: Secondary | ICD-10-CM | POA: Diagnosis present

## 2023-05-23 DIAGNOSIS — I6389 Other cerebral infarction: Secondary | ICD-10-CM

## 2023-05-23 LAB — CBC WITH DIFFERENTIAL/PLATELET
Abs Immature Granulocytes: 0.06 10*3/uL (ref 0.00–0.07)
Basophils Absolute: 0.1 10*3/uL (ref 0.0–0.1)
Basophils Relative: 0 %
Eosinophils Absolute: 0 10*3/uL (ref 0.0–0.5)
Eosinophils Relative: 0 %
HCT: 39.1 % (ref 39.0–52.0)
Hemoglobin: 13.4 g/dL (ref 13.0–17.0)
Immature Granulocytes: 1 %
Lymphocytes Relative: 14 %
Lymphs Abs: 1.8 10*3/uL (ref 0.7–4.0)
MCH: 27.2 pg (ref 26.0–34.0)
MCHC: 34.3 g/dL (ref 30.0–36.0)
MCV: 79.5 fL — ABNORMAL LOW (ref 80.0–100.0)
Monocytes Absolute: 1.1 10*3/uL — ABNORMAL HIGH (ref 0.1–1.0)
Monocytes Relative: 9 %
Neutro Abs: 9.7 10*3/uL — ABNORMAL HIGH (ref 1.7–7.7)
Neutrophils Relative %: 76 %
Platelets: 264 10*3/uL (ref 150–400)
RBC: 4.92 MIL/uL (ref 4.22–5.81)
RDW: 15 % (ref 11.5–15.5)
WBC: 12.8 10*3/uL — ABNORMAL HIGH (ref 4.0–10.5)
nRBC: 0 % (ref 0.0–0.2)

## 2023-05-23 LAB — TSH: TSH: 1.019 u[IU]/mL (ref 0.350–4.500)

## 2023-05-23 LAB — URINALYSIS, ROUTINE W REFLEX MICROSCOPIC
Bacteria, UA: NONE SEEN
Bilirubin Urine: NEGATIVE
Glucose, UA: NEGATIVE mg/dL
Ketones, ur: NEGATIVE mg/dL
Leukocytes,Ua: NEGATIVE
Nitrite: NEGATIVE
Protein, ur: NEGATIVE mg/dL
Specific Gravity, Urine: 1.008 (ref 1.005–1.030)
pH: 6 (ref 5.0–8.0)

## 2023-05-23 LAB — COMPREHENSIVE METABOLIC PANEL
ALT: 27 U/L (ref 0–44)
AST: 33 U/L (ref 15–41)
Albumin: 3.9 g/dL (ref 3.5–5.0)
Alkaline Phosphatase: 57 U/L (ref 38–126)
Anion gap: 12 (ref 5–15)
BUN: 9 mg/dL (ref 8–23)
CO2: 21 mmol/L — ABNORMAL LOW (ref 22–32)
Calcium: 9.3 mg/dL (ref 8.9–10.3)
Chloride: 104 mmol/L (ref 98–111)
Creatinine, Ser: 0.99 mg/dL (ref 0.61–1.24)
GFR, Estimated: >60 mL/min (ref >60)
Glucose, Bld: 123 mg/dL — ABNORMAL HIGH (ref 70–99)
Potassium: 3.2 mmol/L — ABNORMAL LOW (ref 3.5–5.1)
Sodium: 137 mmol/L (ref 135–145)
Total Bilirubin: 1.7 mg/dL — ABNORMAL HIGH (ref 0.3–1.2)
Total Protein: 7.3 g/dL (ref 6.5–8.1)

## 2023-05-23 LAB — LIPID PANEL
Cholesterol: 116 mg/dL (ref 0–200)
HDL: 65 mg/dL (ref >40)
LDL Cholesterol: 39 mg/dL (ref 0–99)
Total CHOL/HDL Ratio: 1.8 RATIO
Triglycerides: 59 mg/dL (ref <150)
VLDL: 12 mg/dL (ref 0–40)

## 2023-05-23 LAB — ECHOCARDIOGRAM COMPLETE
Area-P 1/2: 4.39 cm2
Height: 68 in
S' Lateral: 3.3 cm
Weight: 2151.69 oz

## 2023-05-23 LAB — RAPID URINE DRUG SCREEN, HOSP PERFORMED
Amphetamines: NOT DETECTED
Barbiturates: NOT DETECTED
Benzodiazepines: NOT DETECTED
Cocaine: NOT DETECTED
Opiates: NOT DETECTED
Tetrahydrocannabinol: NOT DETECTED

## 2023-05-23 LAB — VITAMIN B12: Vitamin B-12: 2986 pg/mL — ABNORMAL HIGH (ref 180–914)

## 2023-05-23 LAB — PROTIME-INR
INR: 1.2 (ref 0.8–1.2)
Prothrombin Time: 15.1 seconds (ref 11.4–15.2)

## 2023-05-23 LAB — TROPONIN I (HIGH SENSITIVITY): Troponin I (High Sensitivity): 49 ng/L — ABNORMAL HIGH (ref <18)

## 2023-05-23 LAB — MAGNESIUM: Magnesium: 1.9 mg/dL (ref 1.7–2.4)

## 2023-05-23 LAB — GLUCOSE, CAPILLARY: Glucose-Capillary: 127 mg/dL — ABNORMAL HIGH (ref 70–99)

## 2023-05-23 MED ORDER — ZIPRASIDONE MESYLATE 20 MG IM SOLR
20.0000 mg | Freq: Once | INTRAMUSCULAR | Status: AC
  Administered 2023-05-23: 20 mg via INTRAMUSCULAR
  Filled 2023-05-23: qty 20

## 2023-05-23 MED ORDER — HALOPERIDOL LACTATE 5 MG/ML IJ SOLN
5.0000 mg | Freq: Once | INTRAMUSCULAR | Status: AC | PRN
  Administered 2023-05-23: 5 mg via INTRAVENOUS
  Filled 2023-05-23: qty 1

## 2023-05-23 MED ORDER — LABETALOL HCL 5 MG/ML IV SOLN
10.0000 mg | INTRAVENOUS | Status: DC | PRN
  Filled 2023-05-23: qty 4

## 2023-05-23 MED ORDER — METOPROLOL TARTRATE 5 MG/5ML IV SOLN
5.0000 mg | Freq: Once | INTRAVENOUS | Status: AC
  Administered 2023-05-23: 5 mg via INTRAVENOUS

## 2023-05-23 MED ORDER — NALOXONE HCL 0.4 MG/ML IJ SOLN: 0.4000 mg | INTRAMUSCULAR | Status: DC | PRN

## 2023-05-23 MED ORDER — IOHEXOL 350 MG/ML SOLN
75.0000 mL | Freq: Once | INTRAVENOUS | Status: AC | PRN
  Administered 2023-05-23: 75 mL via INTRAVENOUS

## 2023-05-23 MED ORDER — METOPROLOL TARTRATE 5 MG/5ML IV SOLN
INTRAVENOUS | Status: AC
  Filled 2023-05-23: qty 5

## 2023-05-23 MED ORDER — FENTANYL CITRATE PF 50 MCG/ML IJ SOSY
25.0000 ug | PREFILLED_SYRINGE | INTRAMUSCULAR | Status: DC | PRN
  Administered 2023-05-24: 25 ug via INTRAVENOUS
  Filled 2023-05-23: qty 1

## 2023-05-23 NOTE — Progress Notes (Signed)
PT Note  Patient Details Name: [name] MRN: [id number] DOB: [birth year]     See orders for PT, and noted pt is awaiting transfer to [facility] for stroke work up and C6 fracture. Pt still in ED and not in great state for eval assessment, Will hold PT assessment at this time until transfer to [facility] , or will check on patient tomorrow if still here at [facility].    Marella Bile 05/23/2023, 10:03 AM Clois Dupes, PT, MPT Acute Rehabilitation Services Office: [phone] If a weekend: secure chat groups: [facility] PT, [facility] OT, [facility] SLP 05/23/2023

## 2023-05-23 NOTE — ED Notes (Signed)
Pt is still trying to get out of bed, trying to remove restraints, pt stated "give me my pocket knife". Pt continues bilateral soft wrist restraints, medical staff and pt's son continues to try to redirect pt, however all attempts have failed. IV ativan and haldol have failed at this time. Pt does have another order for PRN 5mg  Haldol. D/t pt's agitation, cardiac monitoring reading ST 130-140s, HTN also noted, unable to get an accurate read d/t pt's fidgeting and stiffening up, Dr. Arlean Hopping has been notified and he is aware.

## 2023-05-23 NOTE — ED Notes (Signed)
Restraints removed.

## 2023-05-23 NOTE — Progress Notes (Addendum)
PROGRESS NOTE    YASIEL SATTERLEE  ION:[id number] DOB: November 27, 1940 DOA: 05/22/2023 PCP: Tresa Garter, MD    Brief Narrative:  Eric [name] is a 82 y.o. male with medical history significant for stroke, essential pretension, who is admitted to [facility] on 05/22/2023 with suspected acute versus subacute ischemic stroke after presenting from home to [facility] ED for evaluation of fall.   In the setting of the patient's altered mental status, the following history is provided by the patient's son, who is present at bedside, in addition to my discussions with the EDP and via chart review.   Son conveys that the patient has been confused relative to his baseline mental status starting around 2100 on 05/19/2023.  This prompted the patient to present to [facility] emergency department overnight, between 9/16 and 9/17.  However, due to reported prolonged wait, the patient left the ED prior to being seen.  He then presented to [facility] emergency department after a mechanical ground-level fall at home, which is reported that the patient tripped while attempting to ambulate.  He hit the front portion of his head as a component of this fall.  Son does not believe that the patient experienced loss of consciousness associated with this fall.   Assessment and Plan: Acute ischemic stroke: -confusion starting around 2100 on 05/19/2023 as well as right sided numbness of unclear duration, presenting CT head is suggestive of acute to subacute cortical infarct in the left parietal lobe, which appears new relative to CT head from 02/22/2023. EDP d/w on-call neurology, Dr. Otelia Limes, who recommended TRH admit to [facility] for further evaluation of acute vs subacute ischemic stroke. Dr. Otelia Limes recommended MRI-brain and conveyed that neurology will formally consult at [facility].  -not a candidate for either tPA or thrombectomy. - TTE without  bubble study  -lipid panel. - PT/OT/ST consults   Acute  intracranial hemorrhage: Presenting CT head shows a 0.9 cm focus of intraparenchymal hemorrhage in the right frontal lobe without evidence of corresponding mass effect or midline shift.  Differential includes hemorrhagic contusion as the patient struck his forehead as a component of mechanical fall at home earlier today versus hemorrhagic conversion of acute ischemic infarct.   EDP d/w on-call neurosurgery who recommended repeat CT head in the AM and convey no indication for urgent overnight neurosurgical involvement.    C6 vertebral body fracture:  - ground-level mechanical fall at home earlier today, today CT cervical spine showed acute nondisplaced fracture through the anterior inferior corner of the C6 vertebral body with associated prevertebral soft tissue swelling.  - EDP discussed patient's case with on-call neurosurgery, who recommended PER DR. Arlean Hopping (not documented in ER note) hard cervical collar as well as MRI of the cervical spine. -patient agitated so not able to wear collar      Acute encephalopathy: 3 days of confusion relative to baseline mental status, with suspicion for contribution from acute ischemic infarct as well as potential contribution from acute intracranial hemorrhage, as above.  No overt evidence of underlying infectious process    Benign Prostatic Hyperplasia:   - on Proscar as outpatient.       Essential Hypertension: -Prn IV labetalol     DVT prophylaxis: SCDs Start: 05/22/23 2315    Code Status: Full Code Family Communication: son at bedside  Disposition Plan:  Level of care: Progressive Status is: Inpatient     Consultants:  NS (at [facility]) Neurology (at [facility])    Subjective: Confused and agitated  overnight into this AM  Objective: Vitals:   05/23/23 0400 05/23/23 0500 05/23/23 0650 05/23/23 0724  BP: (!) 166/100 (!) 178/114 (!) 160/99   Pulse: (!) 109 (!) 107 (!) 108   Resp: (!) 22 (!) 21 (!) 26   Temp:    98.4 F (36.9 C)  TempSrc:     Axillary  SpO2: 93% 97% 96%   Weight:      Height:        Intake/Output Summary (Last 24 hours) at 05/23/2023 0754 Last data filed at 05/23/2023 0134 Gross per 24 hour  Intake 1000 ml  Output 600 ml  Net 400 ml   Filed Weights   05/22/23 2004  Weight: 61 kg    Examination:   General: Appearance:    elderly male in no acute distress     Lungs:      respirations unlabored  Heart:    Tachycardic.    MS:   All extremities are intact.    Neurologic:   Awake, alert-- confused       Data Reviewed: I have personally reviewed following labs and imaging studies  CBC: Recent Labs  Lab 05/20/23 0124 05/22/23 2024 05/23/23 0450  WBC 11.3* 9.8 12.8*  NEUTROABS  --   --  9.7*  HGB 13.6 14.1 13.4  HCT 41.0 42.5 39.1  MCV 81.3 81.7 79.5*  PLT 326 317 264   Basic Metabolic Panel: Recent Labs  Lab 05/20/23 0124 05/22/23 2024 05/22/23 2226 05/23/23 0450  NA 136 136  --  137  K 4.2 3.8  --  3.2*  CL 101 102  --  104  CO2 25 23  --  21*  GLUCOSE 128* 114*  --  123*  BUN 10 9  --  9  CREATININE 1.16 0.97  --  0.99  CALCIUM 9.6 9.6  --  9.3  MG  --   --  1.9 1.9   GFR: Estimated Creatinine Clearance: 49.6 mL/min (by C-G formula based on SCr of 0.99 mg/dL). Liver Function Tests: Recent Labs  Lab 05/22/23 2024 05/23/23 0450  AST 30 33  ALT 27 27  ALKPHOS 60 57  BILITOT 1.2 1.7*  PROT 8.1 7.3  ALBUMIN 4.3 3.9   No results for input(s): "LIPASE", "AMYLASE" in the last 168 hours. No results for input(s): "AMMONIA" in the last 168 hours. Coagulation Profile: Recent Labs  Lab 05/23/23 0450  INR 1.2   Cardiac Enzymes: No results for input(s): "CKTOTAL", "CKMB", "CKMBINDEX", "TROPONINI" in the last 168 hours. BNP (last 3 results) No results for input(s): "PROBNP" in the last 8760 hours. HbA1C: No results for input(s): "HGBA1C" in the last 72 hours. CBG: Recent Labs  Lab 05/20/23 0126 05/22/23 2032  GLUCAP 121* 111*   Lipid Profile: Recent Labs     05/23/23 0450  CHOL 116  HDL 65  LDLCALC 39  TRIG 59  CHOLHDL 1.8   Thyroid Function Tests: Recent Labs    05/22/23 2226  TSH 1.019   Anemia Panel: Recent Labs    05/22/23 2226  VITAMINB12 2,986*   Sepsis Labs: No results for input(s): "PROCALCITON", "LATICACIDVEN" in the last 168 hours.  No results found for this or any previous visit (from the past 240 hour(s)).       Radiology Studies: DG Chest Port 1 View  Result Date: 05/23/2023 CLINICAL DATA:  Elevated troponin, history of recent fall with altered mental status EXAM: PORTABLE CHEST 1 VIEW COMPARISON:  02/23/2019 FINDINGS: The heart size  and mediastinal contours are within normal limits. Both lungs are clear. The visualized skeletal structures are unremarkable. IMPRESSION: No active disease. Electronically Signed   By: Alcide Clever M.D.   On: 05/23/2023 00:43   CT Head Wo Contrast  Result Date: 05/22/2023 CLINICAL DATA:  Fall, head strike. Pain. Redness and swelling to face. EXAM: CT HEAD WITHOUT CONTRAST CT CERVICAL SPINE WITHOUT CONTRAST TECHNIQUE: Multidetector CT imaging of the head and cervical spine was performed following the standard protocol without intravenous contrast. Multiplanar CT image reconstructions of the cervical spine were also generated. RADIATION DOSE REDUCTION: This exam was performed according to the departmental dose-optimization program which includes automated exposure control, adjustment of the mA and/or kV according to patient size and/or use of iterative reconstruction technique. COMPARISON:  CT head 02/22/2023, CTA head/neck 02/22/2023. FINDINGS: CT HEAD FINDINGS Brain: There is a 9 mm focus of intraparenchymal hemorrhage in the right frontal lobe with mild surrounding edema and apparent loss of gray-white differentiation (3-16). There is no mass effect. There is mild swelling in the overlying scalp. There is no other evidence of acute intracranial hemorrhage. There is no evidence of acute  territorial infarct. Extensive infarcts are again seen throughout the bilateral cerebral hemispheres. Infarct in the left parietal lobe is new since the prior study and may be acute (3-22, 6-50). The other cortical infarcts with associated encephalomalacia both cerebral hemispheres, as well as smaller infarcts in the bilateral basal ganglia and left cerebellar hemisphere are unchanged. The ventricles are stable in size. There is no intraventricular hemorrhage. The pituitary and suprasellar region are normal. There is no mass lesion. There is no midline shift. Vascular: There is calcification of the bilateral carotid siphons. Skull: Normal. Negative for fracture or focal lesion. Sinuses/Orbits: The paranasal sinuses are clear. Bilateral lens implants are in place. The globes and orbits are otherwise unremarkable. Other: The mastoid air cells and middle ear cavities are clear. There is mild right frontal scalp swelling. CT CERVICAL SPINE FINDINGS Alignment: There is slight reversal of the normal cervical curvature with trace stepwise retrolisthesis of C3 on C4 through C5 on C6, likely degenerative in nature. There is no jumped or perched facet or other evidence of traumatic malalignment. Skull base and vertebrae: Skull base alignment is maintained. Vertebral body heights are preserved. There is an acute fracture through the anteroinferior corner of the C6 vertebral body (7-34). There is no other evidence of acute fracture. There is no suspicious osseous lesion. Soft tissues and spinal canal: There is prevertebral swelling anterior to the C6 fracture. There is no visible canal hematoma. Disc levels: There is moderate disc space narrowing and degenerative endplate change and mild facet arthropathy throughout the cervical spine. There is no evidence of high-grade spinal canal stenosis. Upper chest: The imaged lung apices are clear. Other: None. IMPRESSION: 1. 0.9 cm focus of intraparenchymal hemorrhage in the right  frontal lobe is favored to reflect a small hemorrhagic contusion given overlying scalp swelling. Differential includes hemorrhagic transformation of a subacute infarct. No significant mass effect. 2. Suspect acute to subacute cortical infarct in the left parietal lobe, new since the CT head from 02/22/2023. Multiple additional remote infarcts in the cerebral and cerebellar hemispheres are unchanged. 3. Acute nondisplaced fracture through the antero inferior corner of the C6 vertebral body with associated prevertebral soft tissue swelling. Consider MRI to evaluate for ligamentous injury as indicated. Critical Value/emergent results were called by telephone at the time of interpretation on 05/22/2023 at 8:51 pm to provider Dr Webb Silversmith,  who verbally acknowledged these results. Electronically Signed   By: Lesia Hausen M.D.   On: 05/22/2023 20:53   CT Cervical Spine Wo Contrast  Result Date: 05/22/2023 CLINICAL DATA:  Fall, head strike. Pain. Redness and swelling to face. EXAM: CT HEAD WITHOUT CONTRAST CT CERVICAL SPINE WITHOUT CONTRAST TECHNIQUE: Multidetector CT imaging of the head and cervical spine was performed following the standard protocol without intravenous contrast. Multiplanar CT image reconstructions of the cervical spine were also generated. RADIATION DOSE REDUCTION: This exam was performed according to the departmental dose-optimization program which includes automated exposure control, adjustment of the mA and/or kV according to patient size and/or use of iterative reconstruction technique. COMPARISON:  CT head 02/22/2023, CTA head/neck 02/22/2023. FINDINGS: CT HEAD FINDINGS Brain: There is a 9 mm focus of intraparenchymal hemorrhage in the right frontal lobe with mild surrounding edema and apparent loss of gray-white differentiation (3-16). There is no mass effect. There is mild swelling in the overlying scalp. There is no other evidence of acute intracranial hemorrhage. There is no evidence of acute  territorial infarct. Extensive infarcts are again seen throughout the bilateral cerebral hemispheres. Infarct in the left parietal lobe is new since the prior study and may be acute (3-22, 6-50). The other cortical infarcts with associated encephalomalacia both cerebral hemispheres, as well as smaller infarcts in the bilateral basal ganglia and left cerebellar hemisphere are unchanged. The ventricles are stable in size. There is no intraventricular hemorrhage. The pituitary and suprasellar region are normal. There is no mass lesion. There is no midline shift. Vascular: There is calcification of the bilateral carotid siphons. Skull: Normal. Negative for fracture or focal lesion. Sinuses/Orbits: The paranasal sinuses are clear. Bilateral lens implants are in place. The globes and orbits are otherwise unremarkable. Other: The mastoid air cells and middle ear cavities are clear. There is mild right frontal scalp swelling. CT CERVICAL SPINE FINDINGS Alignment: There is slight reversal of the normal cervical curvature with trace stepwise retrolisthesis of C3 on C4 through C5 on C6, likely degenerative in nature. There is no jumped or perched facet or other evidence of traumatic malalignment. Skull base and vertebrae: Skull base alignment is maintained. Vertebral body heights are preserved. There is an acute fracture through the anteroinferior corner of the C6 vertebral body (7-34). There is no other evidence of acute fracture. There is no suspicious osseous lesion. Soft tissues and spinal canal: There is prevertebral swelling anterior to the C6 fracture. There is no visible canal hematoma. Disc levels: There is moderate disc space narrowing and degenerative endplate change and mild facet arthropathy throughout the cervical spine. There is no evidence of high-grade spinal canal stenosis. Upper chest: The imaged lung apices are clear. Other: None. IMPRESSION: 1. 0.9 cm focus of intraparenchymal hemorrhage in the right  frontal lobe is favored to reflect a small hemorrhagic contusion given overlying scalp swelling. Differential includes hemorrhagic transformation of a subacute infarct. No significant mass effect. 2. Suspect acute to subacute cortical infarct in the left parietal lobe, new since the CT head from 02/22/2023. Multiple additional remote infarcts in the cerebral and cerebellar hemispheres are unchanged. 3. Acute nondisplaced fracture through the antero inferior corner of the C6 vertebral body with associated prevertebral soft tissue swelling. Consider MRI to evaluate for ligamentous injury as indicated. Critical Value/emergent results were called by telephone at the time of interpretation on 05/22/2023 at 8:51 pm to provider Dr Webb Silversmith, who verbally acknowledged these results. Electronically Signed   By: Selena Lesser.D.  On: 05/22/2023 20:53        Scheduled Meds:   stroke: early stages of recovery book   Does not apply Once   Continuous Infusions:   LOS: 1 day    Time spent: 45 minutes spent on chart review, discussion with nursing staff, consultants, updating family and interview/physical exam; more than 50% of that time was spent in counseling and/or coordination of care.    Joseph Art, DO [facility] Available via Epic secure chat 7am-7pm After these hours, please refer to coverage provider listed on [email or web address] 05/23/2023, 7:54 AM

## 2023-05-23 NOTE — Progress Notes (Signed)
PT Cancellation Note  Patient Details Name: Eric [name] MRN: [id number] DOB: [birth year]   Cancelled Treatment:    Reason Eval/Treat Not Completed: Medical issues which prohibited therapy. Imaging of C-spine pending. PT will hold until further imaging is obtained.   Arlyss Gandy 05/23/2023, 4:13 PM

## 2023-05-23 NOTE — Progress Notes (Signed)
  INTERVAL PROGRESS NOTE TYJON NELLI- 82 y.o. male  LOS: 1 ________________________________________________  SUBJECTIVE: Admitted 05/22/2023 with cc of  Chief Complaint  Patient presents with   Fall   Altered Mental Status   Since admission, acute CVA confirmed on head CT.  OBJECTIVE: Blood pressure (!) 162/104, pulse 95, temperature 97.9 F (36.6 C), temperature source Oral, resp. rate (!) 22, height 5\' 8"  (1.727 m), weight 61 kg, SpO2 98%.  ASSESSMENT/PLAN:  I have reviewed the full H&P by Dr. Arlean Hopping and progress note of Dr. Benjamine Mola, and neurology as well as pertinent labs, imaging, and history.   In addition: Awaiting transfer from [facility] to [facility] for further neurological evaluation.    Principal Problem:   Acute ischemic stroke [facility]) Active Problems:   Essential hypertension   Acute intracranial hemorrhage (HCC)   Acute encephalopathy   C6 cervical fracture (HCC)   Elevated troponin   BPH (benign prostatic hyperplasia)    Leeroy Bock, DO [facility] 05/23/2023, 2:09 PM    [email or web address] Available by Epic secure chat 7AM-7PM. If 7PM-7AM, please contact night-coverage   No Charge

## 2023-05-23 NOTE — Consult Note (Signed)
Neurology Consultation  Reason for Consult: stroke Referring Physician: Dr. Dareen Piano   CC: fall/AMS  History is obtained from:medical record  HPI: Eric [name] is a 82 y.o. male with past medical history of HTN, HLD, prior stroke with residual left side weakness- on 81 mg asa, BPH, COPD who presents to ED for evaluation of fall and AMS. Patient is confused and speech is very dysarthric and hard to understand and information is provided by the family  Family at the bedside. Son states that on Tuesday he developed dizziness and nausea and went to the Ed to be evaluated but due to the long wait and patient feeling better they left the ER prior to being seen. Yesterday the patient was very restless and was going back and forth to the bathroom constantly. Patient subsequently had an unwitnessed fall hitting his head on either the dresser or the floor unknown LOC but son does not think so which prompted him to be evaluated in the ER.  He has had a recent stroke in June of 2024 and since this time he has been declining and having memory issues and confusion. The confusion however has gotten worse over the last 3-4 weeks. His family is in charge of his medications, he is not able to cook or clean CT head obtained and revealed small 1 cm anterior right middle frontal gyrus hemorrhagic contusion versus trace subarachnoid blood; subacute appearing left parietal lobe infarct   LKW: unclear  IV thrombolysis given?: no, outside window  EVT:  No LVO Premorbid modified Rankin scale (mRS): 3-Moderate disability-requires help but walks WITHOUT assistance  ROS:  Unable to obtain due to altered mental status.   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 His smoking use included cigarettes. He has never used smokeless tobacco. He reports that he does not drink alcohol and does not use drugs.  Medications  Current Facility-Administered Medications:     stroke: early stages of recovery book, , Does not apply, Once, Howerter, Justin B, DO   acetaminophen (TYLENOL) tablet 650 mg, 650 mg, Oral, Q6H PRN **OR** acetaminophen (TYLENOL) suppository 650 mg, 650 mg, Rectal, Q6H PRN, Howerter, Justin B, DO   fentaNYL (SUBLIMAZE) injection 25 mcg, 25 mcg, Intravenous, Q2H PRN, Howerter, Justin B, DO   labetalol (NORMODYNE) injection 10 mg, 10 mg, Intravenous, Q2H PRN, Howerter, Justin B, DO, 10 mg at 05/23/23 1518   melatonin tablet 3 mg, 3 mg, Oral, QHS PRN, Howerter, Justin B, DO   naloxone (NARCAN) injection 0.4 mg, 0.4 mg, Intravenous, PRN, Howerter, Justin B, DO   ondansetron (ZOFRAN) injection 4 mg, 4 mg, Intravenous, Q6H PRN, Howerter, Justin B, DO   Exam: Current vital signs: BP (!) 172/87   Pulse 97   Temp 98.3 F (36.8 C) (Oral)   Resp (!) 31   Ht 5\' 8"  (1.727 m)   Wt 61 kg   SpO2 97%  BMI 20.45 kg/m  Vital signs in last 24 hours: Temp:  [97.8 F (36.6 C)-98.7 F (37.1 C)] 98.3 F (36.8 C) (09/20 1428) Pulse Rate:  [91-148] 97 (09/20 1515) Resp:  [17-31] 31 (09/20 1515) BP: (128-178)/(74-115) 172/87 (09/20 1515) SpO2:  [90 %-100 %] 97 % (09/20 1515) Weight:  [61 kg] 61 kg (09/19 2004)  GENERAL: Awake, alert in NAD HEENT: - Normocephalic and atraumatic, dry mm, edentulous  LUNGS - Clear to auscultation bilaterally with no wheezes CV - S1S2 RRR, no m/r/g, equal pulses bilaterally. ABDOMEN - Soft, nontender, nondistended with normoactive BS Ext: warm, well perfused, intact peripheral pulses, no edema  NEURO:  Mental  Status: drowsy, responds to voice. Responds to name, however not able to state his name, age, year, month. Able to state "[facility]" as place. Poor attention, keeps needing stimulation to stay involved with conversation and exam  Language: speech is dysarthric and incomprehensible most times.    Cranial Nerves: PERRL EOM- left eye difficulty crossing midline on lateral gaze, visual fields inconsistent blink to threat bilaterally, but seems to be less on the left (poor vision at baseline per family), subtle left facial asymmetry ,  facial sensation intact, hearing intact, tongue/uvula/soft palate midline, normal  sternocleidomastoid and trapezius muscle strength. No evidence of tongue atrophy or fibrillations  Motor:  bilateral uppers 4/5, weaker hand grip on left, 4/5 in bilateral lowers  Tone: is normal and bulk is abnormal Sensation- Intact to light touch bilaterally Coordination: unable to assess Gait- deferred  NIHSS 1a Level of Conscious.: 1 1b LOC Questions: 2 1c LOC Commands: 0 2 Best Gaze: 0 3 Visual: 1 4 Facial Palsy: 1 5a Motor Arm - left: 0 5b Motor Arm - Right: 0 6a Motor Leg - Left: 1 6b Motor Leg - Right: 1 7 Limb Ataxia: 0 8 Sensory: 0 9 Best Language: 1 10 Dysarthria: 2 11 Extinct. and Inatten.: 0 TOTAL: 10   Labs I have reviewed labs in epic and the results pertinent to this consultation are:  CBC    Component Value Date/Time   WBC 12.8 (H) 05/23/2023 0450   RBC 4.92 05/23/2023 0450   HGB 13.4 05/23/2023 0450   HCT 39.1 05/23/2023 0450   PLT 264 05/23/2023 0450   MCV 79.5 (L) 05/23/2023 0450   MCH 27.2 05/23/2023 0450   MCHC 34.3 05/23/2023 0450   RDW 15.0 05/23/2023 0450   LYMPHSABS 1.8 05/23/2023 0450   MONOABS 1.1 (H) 05/23/2023 0450   EOSABS 0.0 05/23/2023 0450   BASOSABS 0.1 05/23/2023 0450    CMP     Component Value Date/Time   NA 137 05/23/2023 0450   K 3.2 (L) 05/23/2023 0450   CL 104 05/23/2023 0450   CO2 21 (L) 05/23/2023 0450   GLUCOSE  123 (H) 05/23/2023 0450   BUN 9 05/23/2023 0450   CREATININE 0.99 05/23/2023 0450   CALCIUM 9.3 05/23/2023 0450   PROT 7.3 05/23/2023 0450   ALBUMIN 3.9 05/23/2023 0450   AST 33 05/23/2023 0450   ALT 27 05/23/2023 0450   ALKPHOS 57 05/23/2023 0450   BILITOT 1.7 (H) 05/23/2023 0450   GFRNONAA >60 05/23/2023 0450   GFRAA >60 11/08/2015 2151    Lipid Panel     Component Value Date/Time   CHOL 116 05/23/2023 0450   TRIG 59 05/23/2023 0450   HDL 65 05/23/2023 0450   CHOLHDL 1.8 05/23/2023 0450   VLDL 12 05/23/2023 0450   LDLCALC 39 05/23/2023 0450   LDLDIRECT 163.3  05/26/2008 0846    Lab Results  Component Value Date   HGBA1C 5.8 (H) 02/23/2023      Imaging I have reviewed the images obtained:  CT-head-small 1 cm anterior right middle frontal gyrus hemorrhagic contusion versus trace subarachnoid blood; subacute appearing left parietal lobe infarct   Labs  B12 2,986 TSH 1.019 LDLc 39 K 3.2 WBC 12.8 A1c 5.8 (02/2023)  2d echo : EF 55-60%.  Assessment:   82 y.o. male with past medical history of HTN, HLD, prior stroke with residual left side weakness- on 81 mg asa, BPH, COPD who presents to ED for evaluation of fall and AMS. CT head obtained and revealed small 1 cm anterior right middle frontal gyrus hemorrhagic contusion versus trace subarachnoid blood; subacute appearing left parietal lobe infarct   Recommendations: - MRI of the brain without contrast - Frequent neuro checks - CTA head and neck - Prophylactic therapy-Antiplatelet med: None for now - Risk factor modification - Telemetry monitoring - PT consult, OT consult, Speech consult - Stroke team to follow  Gevena Mart DNP, ACNPC-AG  [facility]  I have seen the patient and reviewed the above note.  He has been having increasing difficulty with his speech as well as confusion for the past couple of weeks.  I strongly suspect that his left parietal infarct is subacute and explains his recent  increased confusion.  The right frontal lesion could be a hemorrhagic infarct, but with his fall I think that hemorrhagic contusion would also be possible.  In any case, I would hold antithrombotic therapy for the time being.  His strokes very much look embolic in nature, and he has multiple of them.  He will need a cardiac workup.  Ritta Slot, MD [facility] [phone]  If 7pm- 7am, please page neurology on call as listed in [facility].

## 2023-05-24 ENCOUNTER — Inpatient Hospital Stay (HOSPITAL_COMMUNITY): Payer: Medicare Other

## 2023-05-24 DIAGNOSIS — R569 Unspecified convulsions: Secondary | ICD-10-CM | POA: Diagnosis not present

## 2023-05-24 DIAGNOSIS — I639 Cerebral infarction, unspecified: Secondary | ICD-10-CM | POA: Diagnosis not present

## 2023-05-24 DIAGNOSIS — I10 Essential (primary) hypertension: Secondary | ICD-10-CM | POA: Diagnosis not present

## 2023-05-24 DIAGNOSIS — I629 Nontraumatic intracranial hemorrhage, unspecified: Secondary | ICD-10-CM | POA: Diagnosis not present

## 2023-05-24 DIAGNOSIS — S12500A Unspecified displaced fracture of sixth cervical vertebra, initial encounter for closed fracture: Secondary | ICD-10-CM | POA: Diagnosis not present

## 2023-05-24 LAB — BASIC METABOLIC PANEL
Anion gap: 11 (ref 5–15)
BUN: 10 mg/dL (ref 8–23)
CO2: 20 mmol/L — ABNORMAL LOW (ref 22–32)
Calcium: 9.6 mg/dL (ref 8.9–10.3)
Chloride: 107 mmol/L (ref 98–111)
Creatinine, Ser: 1.22 mg/dL (ref 0.61–1.24)
GFR, Estimated: 59 mL/min — ABNORMAL LOW (ref >60)
Glucose, Bld: 109 mg/dL — ABNORMAL HIGH (ref 70–99)
Potassium: 3.6 mmol/L (ref 3.5–5.1)
Sodium: 138 mmol/L (ref 135–145)

## 2023-05-24 LAB — BLOOD GAS, VENOUS
Acid-base deficit: 2.9 mmol/L — ABNORMAL HIGH (ref 0.0–2.0)
Bicarbonate: 20.9 mmol/L (ref 20.0–28.0)
O2 Saturation: 95.8 %
Patient temperature: 37.2
pCO2, Ven: 33 mmHg — ABNORMAL LOW (ref 44–60)
pH, Ven: 7.41 (ref 7.25–7.43)
pO2, Ven: 74 mmHg — ABNORMAL HIGH (ref 32–45)

## 2023-05-24 LAB — CBC
HCT: 40.7 % (ref 39.0–52.0)
Hemoglobin: 13.6 g/dL (ref 13.0–17.0)
MCH: 26.8 pg (ref 26.0–34.0)
MCHC: 33.4 g/dL (ref 30.0–36.0)
MCV: 80.1 fL (ref 80.0–100.0)
Platelets: 275 10*3/uL (ref 150–400)
RBC: 5.08 MIL/uL (ref 4.22–5.81)
RDW: 15.2 % (ref 11.5–15.5)
WBC: 11.4 10*3/uL — ABNORMAL HIGH (ref 4.0–10.5)
nRBC: 0 % (ref 0.0–0.2)

## 2023-05-24 LAB — D-DIMER, QUANTITATIVE: D-Dimer, Quant: 2.01 ug/mL-FEU — ABNORMAL HIGH (ref 0.00–0.50)

## 2023-05-24 LAB — MAGNESIUM: Magnesium: 2 mg/dL (ref 1.7–2.4)

## 2023-05-24 MED ORDER — POTASSIUM CHLORIDE 2 MEQ/ML IV SOLN
INTRAVENOUS | Status: DC
  Filled 2023-05-24 (×3): qty 1000

## 2023-05-24 MED ORDER — POLYETHYLENE GLYCOL 3350 17 G PO PACK
17.0000 g | PACK | Freq: Every day | ORAL | Status: DC | PRN
  Administered 2023-05-31: 17 g via ORAL
  Filled 2023-05-24: qty 1

## 2023-05-24 MED ORDER — LABETALOL HCL 5 MG/ML IV SOLN
5.0000 mg | INTRAVENOUS | Status: DC | PRN
  Administered 2023-05-24 – 2023-05-27 (×4): 5 mg via INTRAVENOUS
  Filled 2023-05-24 (×6): qty 4

## 2023-05-24 MED ORDER — MORPHINE SULFATE (PF) 2 MG/ML IV SOLN
2.0000 mg | INTRAVENOUS | Status: DC | PRN
  Filled 2023-05-24: qty 1

## 2023-05-24 MED ORDER — DEXAMETHASONE SODIUM PHOSPHATE 10 MG/ML IJ SOLN
8.0000 mg | Freq: Three times a day (TID) | INTRAMUSCULAR | Status: AC
  Administered 2023-05-24 – 2023-05-25 (×3): 8 mg via INTRAVENOUS
  Filled 2023-05-24 (×3): qty 1

## 2023-05-24 MED ORDER — FINASTERIDE 5 MG PO TABS
5.0000 mg | ORAL_TABLET | Freq: Every day | ORAL | Status: DC
  Administered 2023-05-24 – 2023-06-14 (×11): 5 mg via ORAL
  Filled 2023-05-24 (×12): qty 1

## 2023-05-24 MED ORDER — ROSUVASTATIN CALCIUM 20 MG PO TABS
40.0000 mg | ORAL_TABLET | Freq: Every day | ORAL | Status: DC
  Administered 2023-05-24 – 2023-05-31 (×7): 40 mg via ORAL
  Filled 2023-05-24 (×7): qty 2

## 2023-05-24 MED ORDER — HYDROMORPHONE HCL 1 MG/ML IJ SOLN
0.5000 mg | INTRAMUSCULAR | Status: DC | PRN
  Administered 2023-05-24: 0.5 mg via INTRAVENOUS
  Filled 2023-05-24 (×2): qty 0.5

## 2023-05-24 MED ORDER — LORAZEPAM 2 MG/ML IJ SOLN
0.5000 mg | Freq: Once | INTRAMUSCULAR | Status: AC | PRN
  Administered 2023-05-24: 0.5 mg via INTRAVENOUS
  Filled 2023-05-24: qty 1

## 2023-05-24 NOTE — Progress Notes (Signed)
   05/24/23 2044  Assess: MEWS Score  Temp 97.6 F (36.4 C)  BP (!) 155/110  MAP (mmHg) 121  Pulse Rate (!) 134  ECG Heart Rate (!) 135  Resp (!) 35  Level of Consciousness Alert  SpO2 93 %  O2 Device Room Air  Assess: MEWS Score  MEWS Temp 0  MEWS Systolic 0  MEWS Pulse 3  MEWS RR 2  MEWS LOC 0  MEWS Score 5  MEWS Score Color Red  Assess: if the MEWS score is Yellow or Red  Were vital signs accurate and taken at a resting state? Yes  Does the patient meet 2 or more of the SIRS criteria? Yes  Does the patient have a confirmed or suspected source of infection? No  MEWS guidelines implemented  Yes, red  Treat  MEWS Interventions Considered administering scheduled or prn medications/treatments as ordered  Take Vital Signs  Increase Vital Sign Frequency  Red: Q1hr x2, continue Q4hrs until patient remains green for 12hrs  Escalate  MEWS: Escalate Red: Discuss with charge nurse and notify provider. Consider notifying RRT. If remains red for 2 hours consider need for higher level of care  Notify: Charge Nurse/RN  Name of Charge Nurse/RN Notified [facility]  Provider Notification  Provider Name/Title Dr.Hall  Date Provider Notified 05/24/23  Time Provider Notified 2045  Method of Notification  (secure chat)  Notification Reason Other (Comment) (Red mews, and pt restless)  Provider response At bedside;Other (Comment) (medicine added)  Date of Provider Response 05/24/23  Time of Provider Response 2055  Assess: SIRS CRITERIA  SIRS Temperature  0  SIRS Pulse 1  SIRS Respirations  1  SIRS WBC 0  SIRS Score Sum  2

## 2023-05-24 NOTE — Progress Notes (Signed)
OT Cancellation Note  Patient Details Name: NITHIN MERKLIN MRN: [id number] DOB: 05-04-1941   Cancelled Treatment:    Reason Eval/Treat Not Completed: Patient not medically ready (Awaiting hard aspen collar due to C6 fx, OT to follow-up with pt as able)  05/24/2023  AB, OTR/L  Acute Rehabilitation Services  Office: [phone]  Tristan Schroeder 05/24/2023, 9:17 AM

## 2023-05-24 NOTE — Progress Notes (Addendum)
STROKE TEAM PROGRESS NOTE   BRIEF HPI Eric [name] is a 82 y.o. male with history of HTN, HLD, prior stroke with residual left side weakness- on 81 mg asa, BPH, COPD who presents to ED for evaluation of fall and AMS.   INTERIM HISTORY/SUBJECTIVE  Multiple family members in the room. Patient lying in bed.   C-collar in place. He is still awaiting MRI C-spine.  Orthostatic vitals and dementia labs ordered as well.  Family states they think he was sitting on the bed and went to stand up when he fell on Thursday.  They also describe episodes of him wandering outside not knowing where he is and an episode of confusion on Tuesday of last week. EEG pending    OBJECTIVE  CBC    Component Value Date/Time   WBC 11.4 (H) 05/24/2023 0654   RBC 5.08 05/24/2023 0654   HGB 13.6 05/24/2023 0654   HCT 40.7 05/24/2023 0654   PLT 275 05/24/2023 0654   MCV 80.1 05/24/2023 0654   MCH 26.8 05/24/2023 0654   MCHC 33.4 05/24/2023 0654   RDW 15.2 05/24/2023 0654   LYMPHSABS 1.8 05/23/2023 0450   MONOABS 1.1 (H) 05/23/2023 0450   EOSABS 0.0 05/23/2023 0450   BASOSABS 0.1 05/23/2023 0450    BMET    Component Value Date/Time   NA 138 05/24/2023 0654   K 3.6 05/24/2023 0654   CL 107 05/24/2023 0654   CO2 20 (L) 05/24/2023 0654   GLUCOSE 109 (H) 05/24/2023 0654   BUN 10 05/24/2023 0654   CREATININE 1.22 05/24/2023 0654   CALCIUM 9.6 05/24/2023 0654   GFRNONAA 59 (L) 05/24/2023 0654    IMAGING past 24 hours CT ANGIO HEAD NECK W WO CM  Result Date: 05/23/2023 CLINICAL DATA:  Stroke/TIA.  Determine embolic source. EXAM: CT ANGIOGRAPHY HEAD AND NECK WITH AND WITHOUT CONTRAST TECHNIQUE: Multidetector CT imaging of the head and neck was performed using the standard protocol during bolus administration of intravenous contrast. Multiplanar CT image reconstructions and MIPs were obtained to evaluate the vascular anatomy. Carotid stenosis measurements (when applicable) are obtained utilizing  NASCET criteria, using the distal internal carotid diameter as the denominator. RADIATION DOSE REDUCTION: This exam was performed according to the departmental dose-optimization program which includes automated exposure control, adjustment of the mA and/or kV according to patient size and/or use of iterative reconstruction technique. CONTRAST:  75mL OMNIPAQUE IOHEXOL 350 MG/ML SOLN COMPARISON:  CTA head neck 02/22/2023 FINDINGS: CT HEAD FINDINGS Brain: Advanced chronic white matter changes with multiple old infarcts, predominantly in the right hemisphere, but also the left parietal lobe and cerebellum. No acute hemorrhage. Mild volume loss. Vascular: There is atherosclerotic calcification of both internal carotid arteries at the skull base. Skull: Negative Sinuses/Orbits: Paranasal sinuses are clear. No mastoid effusion. Normal orbits. Other: None Review of the MIP images confirms the above findings CTA NECK FINDINGS Aortic arch: Standard branching. Imaged portion shows no evidence of aneurysm or dissection. No significant stenosis of the major arch vessel origins. Right carotid system: Mild atherosclerosis of the proximal ICA. The remainder of the ICA is diminutive but there is no focal stenosis. Left carotid system: Multifocal atherosclerotic calcification of the common carotid artery without stenosis. Mixed density plaque within the proximal ICA, causing less than 50% stenosis. The distal ICA is normal. Vertebral arteries: Right dominant. Both origins are normal. Both vertebral arteries are normal to the skull base. Skeleton: Negative Other neck: Negative. Upper chest: Mild biapical paraseptal emphysema. Review  of the MIP images confirms the above findings CTA HEAD FINDINGS Anterior circulation: --Intracranial internal carotid arteries: Diffuse mild narrowing of the right ICA at the skull base. Severe stenosis of the distal right cavernous segment due to atherosclerotic calcification. Calcific atherosclerosis of  the left ICA at the skull base but no stenosis. --Anterior cerebral arteries (ACA): Absent right A1 segments. Otherwise normal. --Middle cerebral arteries (MCA): No proximal occlusion or flow-limiting stenosis. Posterior circulation: --Vertebral arteries: Normal --Inferior cerebellar arteries: Normal. --Basilar artery: Normal. --Superior cerebellar arteries: Normal. --Posterior cerebral arteries: Normal. Venous sinuses: Poor visualization due to contrast timing Anatomic variants: Absent right A1 segment, normal variant. Review of the MIP images confirms the above findings IMPRESSION: 1. No emergent large vessel occlusion. 2. 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 3. Bilateral carotid bifurcation atherosclerosis without focal stenosis. 4. Advanced chronic white matter changes with multiple old infarcts, predominantly in the right hemisphere, but also the left parietal lobe and cerebellum. Emphysema (ICD10-J43.9). Electronically Signed   By: Deatra Robinson M.D.   On: 05/23/2023 21:34   MR BRAIN WO CONTRAST  Result Date: 05/23/2023 CLINICAL DATA:  Acute neurologic deficit EXAM: MRI HEAD WITHOUT CONTRAST TECHNIQUE: Multiplanar, multiecho pulse sequences of the brain and surrounding structures were obtained without intravenous contrast. COMPARISON:  None Available. FINDINGS: Truncated and motion degraded examination. There is an area of abnormal diffusion restriction within the anterior right frontal lobe. 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 Normal flow voids at the skull base. Normal orbits. Clear paranasal sinuses and mastoid air cells. IMPRESSION: 1. Truncated and motion degraded examination. 2. Acute infarct within the anterior right frontal lobe. 3. Posterior right MCA territory encephalomalacia. 4. 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 Electronically Signed   By: Deatra Robinson M.D.   On:  05/23/2023 21:25   ECHOCARDIOGRAM COMPLETE  Result Date: 05/23/2023    ECHOCARDIOGRAM REPORT   Patient Name:   Eric [name] [facility] Date of Exam: 05/23/2023 Medical Rec #:  [id number]         Height:       68.0 in Accession #:    [id number]        Weight:       134.5 lb Date of Birth:  03/03/1941         BSA:          1.727 m Patient Age:    82 years          BP:           128/74 mmHg Patient Gender: M                 HR:           103 bpm. Exam Location:  Inpatient Procedure: 2D Echo, Cardiac Doppler and Color Doppler Indications:    Stroke  History:        Patient has prior history of Echocardiogram examinations, most                 recent 02/23/2023. Stroke and COPD; Risk Factors:Hypertension.  Sonographer:    Darlys Gales Referring Phys: [id number] Angie Fava  Sonographer Comments: Technically difficult study due to poor echo windows. Image acquisition challenging due to COPD and Image acquisition challenging due to uncooperative patient. IMPRESSIONS  1. Technically difficult study with very limited visualization of cardiac structures.  2. Left ventricular systolic function appears normal with an EF of 55% to 60%.  3. The  right ventricle appears normal in structure and function.  4. The cardiac valves are not well visualized but there are no obvious abnormalities. FINDINGS  Left Ventricle: Left ventricular ejection fraction, by estimation, is 60 to 65%. The left ventricle has normal function. The left ventricle has no regional wall motion abnormalities. The left ventricular internal cavity size was normal in size. There is  no left ventricular hypertrophy. Left ventricular diastolic function could not be evaluated. Right Ventricle: The right ventricular size is normal. No increase in right ventricular wall thickness. Right ventricular systolic function is normal. Left Atrium: Left atrial size was normal in size. Right Atrium: Right atrial size was normal in size. Pericardium: There is no evidence of  pericardial effusion. Mitral Valve: The mitral valve is grossly normal. No evidence of mitral valve regurgitation. No evidence of mitral valve stenosis. Tricuspid Valve: The tricuspid valve is not well visualized. Tricuspid valve regurgitation is not demonstrated. No evidence of tricuspid stenosis. Aortic Valve: The aortic valve is grossly normal. Aortic valve regurgitation is not visualized. No aortic stenosis is present. Pulmonic Valve: The pulmonic valve was not assessed. Pulmonic valve regurgitation is not visualized. No evidence of pulmonic stenosis. Aorta: The aortic root was not well visualized. Venous: The inferior vena cava was not well visualized. IAS/Shunts: No atrial level shunt detected by color flow Doppler.  LEFT VENTRICLE PLAX 2D LVIDd:         3.80 cm LVIDs:         3.30 cm LV PW:         0.90 cm LV IVS:        0.50 cm LVOT diam:     1.80 cm LVOT Area:     2.54 cm  MITRAL VALVE               TRICUSPID VALVE MV Area (PHT): 4.39 cm    TR Peak grad:   29.4 mmHg MV Decel Time: 173 msec    TR Vmax:        271.00 cm/s MV E velocity: 65.10 cm/s                            SHUNTS                            Systemic Diam: 1.80 cm Aditya Sabharwal Electronically signed by Dorthula Nettles Signature Date/Time: 05/23/2023/2:14:30 PM    Final     Vitals:   05/24/23 0300 05/24/23 0400 05/24/23 0500 05/24/23 0639  BP: (!) 148/82 (!) 137/90  117/78  Pulse: 93 99  86  Resp: 16 15  20   Temp:      TempSrc:      SpO2: 95% 94%  95%  Weight:   51.7 kg   Height:         PHYSICAL EXAM General:  Alert, well-nourished, well-developed patient in no acute distress Psych:  Mood and affect appropriate for situation CV: Regular rate and rhythm on monitor Respiratory:  Regular, unlabored respirations on room air GI: Abdomen soft and nontender   NEURO:  Mental Status: drowsy, responds to voice. Responds to name, states he is 60, unable to state year or day able to state "[facility]" as place.  He was able to  identify his son.  He did not know the president but asked by something he would usually know for family. Language: speech is dysarthric and incomprehensible most  times.    Cranial Nerves: PERRL EOM- left eye difficulty crossing midline on lateral gaze, visual fields inconsistent blink to threat bilaterally, but seems to be less on the left (poor vision at baseline per family), subtle left facial asymmetry ,  facial sensation intact, hearing intact, tongue/uvula/soft palate midline, normal  sternocleidomastoid and trapezius muscle strength. No evidence of tongue atrophy or fibrillations  Motor:  bilateral uppers 4/5, weaker hand grip on left, 4/5 in bilateral lowers  Tone: is normal and bulk is abnormal Sensation- Intact to light touch bilaterally Coordination: unable to assess Gait- deferred  NIHSS 1a Level of Conscious.: 1 1b LOC Questions: 2 1c LOC Commands: 0 2 Best Gaze: 0 3 Visual: 1 4 Facial Palsy: 1 5a Motor Arm - left: 0 5b Motor Arm - Right: 0 6a Motor Leg - Left: 1 6b Motor Leg - Right: 1 7 Limb Ataxia: 0 8 Sensory: 0 9 Best Language: 1 10 Dysarthria: 2 11 Extinct. and Inatten.: 0 TOTAL: 10  ASSESSMENT/PLAN  Acute Ischemic Infarct:  Subacute left parietal infarct with possible hemorrhage infarct vs hemorrhagic contusion in right frontal region Etiology:  likely cardio embolic vs vessel disease Code Stroke CT head - small 1 cm anterior right middle frontal gyrus hemorrhagic contusion versus trace subarachnoid blood; subacute appearing left parietal lobe infarct  CTA head & neck Unchanged severe stenosis of the distal right internal carotid artery cavernous segment due to atherosclerotic calcification. Bilateral carotid bifurcation atherosclerosis without focal stenosis. MRI  Acute infarct within the anterior right frontal lobe.  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 2D Echo EF 55% LDL 39 HgbA1c 5.8 VTE prophylaxis - SCDs aspirin  81 mg daily prior to admission, now on No antithrombotic due to hemorrhage. Therapy recommendations:  Pending  Disposition:  Pending   Hx of Stroke/TIA 02/22/2023 - left parietal occipital small infarct, embolic pattern etiology unclear, concerning for cardioembolic source versus large vessel disease  CT 6/22 small right frontal SAH Repeat CT 6/23 no hemorrhage MRI limited study, left parietal occipital small infarct CTA head and neck bilateral carotid siphon and bulb atherosclerosis 2D Echo EF 45 to 50% LE venous Doppler no DVT LDL 112 30-day cardiac event monitor negative for A-fib Has had falls, high risk for oral anticoagulation   Hypertension Home meds:  amlodipine- benazepril Stable Blood pressure goal: Normotension   Hyperlipidemia Home meds:  Crestor, resumed in hospital LDL 39, goal < 70 Continue statin at discharge  Dysphagia Patient has post-stroke dysphagia, SLP consulted    Diet   Diet clear liquid Room service appropriate? Yes; Fluid consistency: Thin   Advance diet as tolerated   Other Active Problems C6 fracture C-collar in place. MRI C spine pending  Acute encephalopathy [id number]  TSH 1.019 UA negative, afebrile  BPH Elevated troponin   Hospital day # 2  Patient seen and examined by NP/APP with MD. MD to update note as needed.   Elmer Picker, DNP, FNP-BC [facility] Pager: [phone]  ATTENDING ATTESTATION:  82 year old status post fall with  right frontal contusion old right MCA encephalomalacia old left cerebral artery stroke MRI study appears to be motion degraded may consider repeat head CT tomorrow prior to starting antithrombotic therapy.  He has carotid atherosclerosis disease.  He is in a c-collar due to the fall MRI C-spine pending with C-spine showing C6 vertebral body fracture.  Still appears to be confused and not that far from his baseline per family.  Continue to monitor.  Echo  pending, EEG pending to eval for  seizure given his altered mental status.  Will continue to follow.  Dr. Viviann Spare evaluated pt independently, reviewed imaging, chart, labs. Discussed and formulated plan with the Resident/APP. Changes were made to the note where appropriate. Please see APP/resident note above for details.     MDM: Moderate Pertinent labs, imaging results reviewed by me and considered in my decision making. Independently reviewed imaging. Medical records reviewed. Discussed the patient with another medical provider/personnel. Obtained history from someone other than the patient.    Montie Swiderski,MD     To contact Stroke Continuity provider, please refer to [email or web address]. After hours, contact General Neurology

## 2023-05-24 NOTE — Progress Notes (Signed)
Placed [place] c-collar on patient while maintaining c-spine.  Explained procedure to family and patient.  Pt is resting comfortably and getting an EEG at bedside now.   Last imported Vital Signs BP 117/78   Pulse 86   Temp 98.5 F (36.9 C) (Oral)   Resp 20   Ht 5\' 8"  (1.727 m)   Wt 113 lb 15.7 oz (51.7 kg)   SpO2 98%   BMI 17.33 kg/m   Trending CBC Recent Labs    05/22/23 2024 05/23/23 0450 05/24/23 0654  WBC 9.8 12.8* 11.4*  HGB 14.1 13.4 13.6  HCT 42.5 39.1 40.7  PLT 317 264 275    Trending Coag's Recent Labs    05/23/23 0450  INR 1.2    Trending BMET Recent Labs    05/22/23 2024 05/23/23 0450 05/24/23 0654  NA 136 137 138  K 3.8 3.2* 3.6  CL 102 104 107  CO2 23 21* 20*  BUN 9 9 10   CREATININE 0.97 0.99 1.22  GLUCOSE 114* 123* 109*    Giuseppina Quinones W  Trauma Response RN  Please call TRN at [phone] for further assistance.

## 2023-05-24 NOTE — Procedures (Signed)
Patient Name: Eric [name]  MRN: [id number]  Epilepsy Attending: Charlsie Quest  Referring Physician/Provider: Elmer Picker, NP  Date: 05/24/2023 Duration: 24.51 mins  Patient history: 18y M with ams getting eeg to evaluate for seizure  Level of alertness: Awake  AEDs during EEG study: Ativan  Technical aspects: This EEG study was done with scalp electrodes positioned according to the 10-20 International system of electrode placement. Electrical activity was reviewed with band pass filter of 1-70Hz , sensitivity of 7 uV/mm, display speed of 68mm/sec with a 60Hz  notched filter applied as appropriate. EEG data were recorded continuously and digitally stored.  Video monitoring was available and reviewed as appropriate.  Description: The posterior dominant rhythm consists of 8-9 Hz activity of moderate voltage (25-35 uV) seen predominantly in posterior head regions, asymmetric  ( right<left) and reactive to eye opening and eye closing. EEG showed continuous rhythmic 3 to 6 Hz theta-delta slowing in right temporal region. Intermittent generalized 3-6hz  theta-delta slowing was also noted. Hyperventilation and photic stimulation were not performed.     ABNORMALITY - Intermittent slow, generalized - Continuous slow, right temporal region  IMPRESSION: This study is  suggestive of cortical dysfunction arising from right temporal region likely secondary to underlying structural abnormality. Additionally there is mild diffuse encephalopathy. No seizures or epileptiform discharges were seen throughout the recording.   Keishawna Carranza Annabelle Harman

## 2023-05-24 NOTE — Progress Notes (Signed)
OT Cancellation Note  Patient Details Name: Eric [name] MRN: [id number] DOB: Jan 19, 1941   Cancelled Treatment:    Reason Eval/Treat Not Completed: Patient not medically ready (Awaiting MRI of c-spine and neurosurgery consult. MD notified therapists not to proceed until neuro consult. OT will follow-up with pt as able.)  05/24/2023  AB, OTR/L  Acute Rehabilitation Services  Office: [phone]   Tristan Schroeder 05/24/2023, 3:37 PM

## 2023-05-24 NOTE — Progress Notes (Signed)
EEG completed, results pending.

## 2023-05-24 NOTE — Evaluation (Signed)
Clinical/Bedside Swallow Evaluation Patient Details  Name: Eric [name] MRN: [id number] Date of Birth: 01-May-1941  Today's Date: 05/24/2023 Time: SLP Start Time (ACUTE ONLY): 1311 SLP Stop Time (ACUTE ONLY): 1340 SLP Time Calculation (min) (ACUTE ONLY): 29 min  Past Medical History:  Past Medical History:  Diagnosis Date   Benign prostatic hypertrophy    COPD (chronic obstructive pulmonary disease) (HCC)    in past   Difficulty reading    and writing   ED (erectile dysfunction)    Gynecomastia, [name]    bilateral breast removal   Hypertension    Stroke (HCC) 06/2014   Past Surgical History:  Past Surgical History:  Procedure Laterality Date   bilateral breast removal     COLONOSCOPY  04/24/05   HPI:  Eric [name] presents to [facility] hospital on 05/23/2023 as a transfer from [facility] ED after a fall. CT head suggestive of acute to subacute L parietal infarct, as well as IPH in R frontal lobe. Pt also found to have C6 vertebral body fx. PMH includes CVA, HTN. Pt is mild-moderately dysarthric    Assessment / Plan / Recommendation  Clinical Impression  St seen for skilled ST services partially upright in his bed to assess PO readiness. The pt's son and three brothers were present t/o the eval. Pt is currently on a clear liquid diet and was assessed with thin liquid, puree, and solids. The pt is in a cervical collar and was unable to safely be repositioned to be sitting in the center of the bed. The pts OME revealed generalized facial, labial, and lingual weakness, poor labial closure, a weak cued cough, and xerostomia- pt unable to elicit a swallow. The pt consumed thin liquid given total assist via straw and spoon, the pt had intermittent poor labial seal that made PO intake difficult. Given small spoon sips, the initially had no s/sx of aspiration but after approx. x5 trials, the pt had a wet voice, delayed throat clearing and coughing. He had decreased hyolaryngeal excursion and  occasionally took multiple swallows to clear the bolus. The pt consumed puree with prolonged oral transit and possible lingual pumping, the pt had slight wet vocal quality and x1 delayed throat clearance. The pt consumed a small bite of regular solids with poor mastication and bolus manipulation and mild oral residue. The residue was attempted to be cleared by a sip of thin liquids, which resulted in an immediate cough. The pt's family was given education on the s/sx + risks of aspiration, his son verbally acknowledged understanding. Given the pt's success with PO trials, safest diet is clear liquids with STRICT aspiration precautions (small sips, total assist, slow rate, cued cough then reswallow, NO straws, upright for all PO intake) and FREQUENT ORAL CARE. Pt would be appropriate for MBS when medically ready to determine if aspiration is occurring, SLP to f/u with POC.      Aspiration Risk  Moderate aspiration risk;Risk for inadequate nutrition/hydration    Diet Recommendation Thin liquid    Liquid Administration via: Spoon;No straw Medication Administration: Via alternative means Supervision: Staff to assist with self feeding Compensations: Slow rate;Small sips/bites;Clear throat after each swallow Postural Changes: Seated upright at 90 degrees;Remain upright for at least 30 minutes after po intake    Other  Recommendations Oral Care Recommendations: Oral care BID    Recommendations for follow up therapy are one component of a multi-disciplinary discharge planning process, led by the attending physician.  Recommendations may be updated based on  patient status, additional functional criteria and insurance authorization.     Assistance Recommended at Discharge    Functional Status Assessment Patient has had a recent decline in their functional status and demonstrates the ability to make significant improvements in function in a reasonable and predictable amount of time.  Frequency and Duration  min 3x week  1 week       Prognosis Prognosis for improved oropharyngeal function: Fair Barriers to Reach Goals: Cognitive deficits;Severity of deficits;Language deficits      Swallow Study   General Date of Onset: 05/22/23 HPI: Eric [name] presents to [facility] hospital on 05/23/2023 as a transfer from [facility] ED after a fall. CT head suggestive of acute to subacute L parietal infarct, as well as IPH in R frontal lobe. Pt also found to have C6 vertebral body fx. PMH includes CVA, HTN. Pt is mild-moderately dysarthric Type of Study: Bedside Swallow Evaluation Diet Prior to this Study: Clear liquid diet Temperature Spikes Noted: No Respiratory Status: Room air Behavior/Cognition: Pleasant mood;Confused;Distractible;Requires cueing;Cooperative Self-Feeding Abilities: Total assist Patient Positioning: Upright in bed;Postural control interferes with function (Pt is in a collar and had some left sided leaning in the bed. Unable to reposition due to cervical injury.) Baseline Vocal Quality: Low vocal intensity Volitional Cough: Weak Volitional Swallow: Unable to elicit    Oral/Motor/Sensory Function Overall Oral Motor/Sensory Function: Moderate impairment Facial ROM: Reduced left;Reduced right Facial Symmetry: Abnormal symmetry right;Abnormal symmetry left Facial Strength: Reduced right;Reduced left Facial Sensation: Within Functional Limits Lingual ROM: Reduced right;Reduced left Lingual Strength: Reduced Lingual Sensation: Within Functional Limits   Ice Chips     Thin Liquid Thin Liquid: Impaired Presentation: Spoon;Straw Oral Phase Impairments: Reduced labial seal Oral Phase Functional Implications: Oral holding Pharyngeal  Phase Impairments: Wet Vocal Quality;Multiple swallows;Decreased hyoid-laryngeal movement;Throat Clearing - Delayed;Cough - Delayed    Nectar Thick     Honey Thick     Puree Puree: Impaired Presentation: Spoon Oral Phase Impairments: Reduced labial  seal;Reduced lingual movement/coordination Oral Phase Functional Implications: Prolonged oral transit Pharyngeal Phase Impairments: Decreased hyoid-laryngeal movement;Wet Vocal Quality;Throat Clearing - Delayed   Solid     Solid: Impaired Presentation: Spoon Oral Phase Impairments: Reduced labial seal;Impaired mastication;Reduced lingual movement/coordination Oral Phase Functional Implications: Impaired mastication;Oral residue Pharyngeal Phase Impairments: Multiple swallows      Dione Housekeeper M.S. CCC-SLP

## 2023-05-24 NOTE — Progress Notes (Signed)
Received a call from bedside RN regarding the patient being agitated after receiving Ativan prior to MRI.  Presented at bedside, the patient is agitated and restless.  States he hurts all over.  0.5 mg IV Dilaudid given with improvement of his agitation and restlessness.  Discussed the case with neurosurgery on-call, PA Cosentino, after cervical spine MRI results.  Neurosurgery was unable to give a good assessment due to motion artifact.  Recommended to wear the hard collar at all times, IV Decadron 8 mg 3 times daily x 3 doses, avoid narcotics due to encephalopathy, no NSAIDs due to intracranial hemorrhage, and repeat cervical spine MRI.  Returned to bedside, the patient is a lot calmer after the above interventions.  Updated the patient's daughter and son at bedside.  Time: 15 minutes.

## 2023-05-24 NOTE — Progress Notes (Signed)
PT Cancellation Note  Patient Details Name: Eric [name] MRN: [id number] DOB: 1940-09-30   Cancelled Treatment:    Reason Eval/Treat Not Completed: Patient not medically ready  Awaiting MRI of c-spine and neurosurgery consult. Discussed with Dr. Jerral Ralph and agreed to hold PT at this time.   Jerolyn Center, PT Acute Rehabilitation Services  Office [phone]  Zena Amos 05/24/2023, 1:07 PM

## 2023-05-24 NOTE — Progress Notes (Addendum)
motion degraded examination. There is an area of abnormal diffusion restriction within the anterior right frontal lobe. 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 Normal flow voids at the skull base. Normal orbits. Clear paranasal sinuses and mastoid air cells. IMPRESSION: 1. Truncated and motion degraded examination. 2. Acute infarct within the anterior right frontal lobe. 3. Posterior right MCA territory encephalomalacia. 4. 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 Electronically Signed   By: Deatra Robinson M.D.   On: 05/23/2023 21:25   ECHOCARDIOGRAM COMPLETE  Result Date: 05/23/2023    ECHOCARDIOGRAM REPORT   Patient Name:   Eric [name] [employer] Date of Exam: 05/23/2023 Medical Rec #:  [id number]         Height:       68.0 in Accession #:    [id number]        Weight:       134.5 lb Date of Birth:  1941/02/17         BSA:          1.727 m Patient Age:    82 years          BP:           128/74 mmHg Patient Gender: M                  HR:           103 bpm. Exam Location:  Inpatient Procedure: 2D Echo, Cardiac Doppler and Color Doppler Indications:    Stroke  History:        Patient has prior history of Echocardiogram examinations, most                 recent 02/23/2023. Stroke and COPD; Risk Factors:Hypertension.  Sonographer:    Darlys Gales Referring Phys: [id number] Angie Fava  Sonographer Comments: Technically difficult study due to poor echo windows. Image acquisition challenging due to COPD and Image acquisition challenging due to uncooperative patient. IMPRESSIONS  1. Technically difficult study with very limited visualization of cardiac structures.  2. Left ventricular systolic function appears normal with an EF of 55% to 60%.  3. The right ventricle appears normal in structure and function.  4. The cardiac valves are not well visualized but there are no obvious abnormalities. FINDINGS  Left Ventricle: Left ventricular ejection fraction, by estimation, is 60 to 65%. The left ventricle has normal function. The left ventricle has no regional wall motion abnormalities. The left ventricular internal cavity size was normal in size. There is  no left ventricular hypertrophy. Left ventricular diastolic function could not be evaluated. Right Ventricle: The right ventricular size is normal. No increase in right ventricular wall thickness. Right ventricular systolic function is normal. Left Atrium: Left atrial size was normal in size. Right Atrium: Right atrial size was normal in size. Pericardium: There is no evidence of pericardial effusion. Mitral Valve: The mitral valve is grossly normal. No evidence of mitral valve regurgitation. No evidence of mitral valve stenosis. Tricuspid Valve: The tricuspid valve is not well visualized. Tricuspid valve regurgitation is not demonstrated. No evidence of tricuspid stenosis. Aortic Valve: The aortic valve is grossly normal. Aortic valve regurgitation is not visualized. No aortic stenosis  is present. Pulmonic Valve: The pulmonic valve was not assessed. Pulmonic valve regurgitation is not visualized. No evidence of pulmonic stenosis. Aorta: The aortic root was not well visualized. Venous: The inferior vena cava was not well visualized. IAS/Shunts: No atrial level shunt detected by color flow Doppler.  LEFT VENTRICLE PLAX  PROGRESS NOTE        PATIENT DETAILS Name: Eric [name] Age: 82 y.o. Sex: male Date of Birth: Apr 11, 1941 Admit Date: 05/22/2023 Admitting Physician Angie Fava, DO ZOX:[id number], Georgina Quint, MD  Brief Summary: Patient is a 82 y.o.  male with history of CVA, HTN-who sustained a mechanical fall at home-upon further evaluation-was found to have acute CVA and C6 vertebral body fracture.  Patient was initially evaluated at [facility] and transferred to [facility] service at [facility] for further evaluation  Significant events: 9/19>> admit to [facility]  Significant studies: 6/23>> A1c: 5.8 9/19>> CT head: 0.9 cm focus of intraparenchymal hemorrhage right frontal lobe.  Acute to subacute cortical infarct left parietal lobe. 9/19 >>CT C-spine: Acute nondisplaced fracture through the anterior inferior corner of C6 vertebral body. 9/20>> CT head: Stable 1 cm anterior right middle frontal gyrus hemorrhagic contusion versus trace subarachnoid blood. 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 9/20>> MRI brain: Acute infarct anterior right frontal lobe. 9/20>> echo: EF 55-60% 9/20>> LDL: 112  Significant microbiology data: None  Procedures: None  Consults: Neurology  Subjective: Looks frail/disheveled-some dysarthria  Objective: Vitals: Blood pressure 117/78, pulse 86, temperature 98.9 F (37.2 C), temperature source Axillary, resp. rate 20, height 5\' 8"  (1.727 m), weight 51.7 kg, SpO2 95%.   Exam: Gen Exam:not in any distress HEENT:atraumatic, normocephalic Chest: B/L clear to auscultation anteriorly CVS:S1S2 regular Abdomen:soft non tender, non distended Extremities:no edema Neurology: Difficult exam but appears to have mild left-sided weakness.  Speech appears to be dysarthric. Skin: no rash  Pertinent  Labs/Radiology:    Latest Ref Rng & Units 05/24/2023    6:54 AM 05/23/2023    4:50 AM 05/22/2023    8:24 PM  CBC  WBC 4.0 - 10.5 K/uL 11.4  12.8  9.8   Hemoglobin 13.0 - 17.0 g/dL 81.1  91.4  78.2   Hematocrit 39.0 - 52.0 % 40.7  39.1  42.5   Platelets 150 - 400 K/uL 275  264  317     Lab Results  Component Value Date   NA 138 05/24/2023   K 3.6 05/24/2023   CL 107 05/24/2023   CO2 20 (L) 05/24/2023     Assessment/Plan: Acute CVA Unclear whether this is embolic  Workup as above Some mild dysarthria and left-sided weakness Not on antiplatelets-due to concern that hemorrhagic contusion on numerous CT imaging studies-although not seen on MRI Await further input from neurology PT/OT/SLP eval.  C6 vertebral body fracture Secondary to mechanical fall Apparently patient was agitated on 9/20-he was unable to wear the hard collar This morning-son at bedside-he seems pretty calm-have asked nursing staff to see if we can retry and place hard collar Awaiting MRI C-spine ED MD had discussed with neurosurgery-who recommended that there was no emergent indication for neurosurgical evaluation-but had recommended a MRI C Spine-which is still pending at this time. Once obtained will need to d/w Neurosurgery about further recommendations  HTN All antihypertensives held-permissive hypertension for now  HLD Resume Crestor-increase to 40 mg daily  BPH Proscar Frequent bladder scans to ensure no retention  Underweight: Estimated body mass index is 17.33 kg/m as calculated from the following:   Height as of this encounter: 5\' 8"  (1.727 m).   Weight as  PROGRESS NOTE        PATIENT DETAILS Name: Eric [name] Age: 82 y.o. Sex: male Date of Birth: Apr 11, 1941 Admit Date: 05/22/2023 Admitting Physician Angie Fava, DO ZOX:[id number], Georgina Quint, MD  Brief Summary: Patient is a 82 y.o.  male with history of CVA, HTN-who sustained a mechanical fall at home-upon further evaluation-was found to have acute CVA and C6 vertebral body fracture.  Patient was initially evaluated at [facility] and transferred to [facility] service at [facility] for further evaluation  Significant events: 9/19>> admit to [facility]  Significant studies: 6/23>> A1c: 5.8 9/19>> CT head: 0.9 cm focus of intraparenchymal hemorrhage right frontal lobe.  Acute to subacute cortical infarct left parietal lobe. 9/19 >>CT C-spine: Acute nondisplaced fracture through the anterior inferior corner of C6 vertebral body. 9/20>> CT head: Stable 1 cm anterior right middle frontal gyrus hemorrhagic contusion versus trace subarachnoid blood. 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 9/20>> MRI brain: Acute infarct anterior right frontal lobe. 9/20>> echo: EF 55-60% 9/20>> LDL: 112  Significant microbiology data: None  Procedures: None  Consults: Neurology  Subjective: Looks frail/disheveled-some dysarthria  Objective: Vitals: Blood pressure 117/78, pulse 86, temperature 98.9 F (37.2 C), temperature source Axillary, resp. rate 20, height 5\' 8"  (1.727 m), weight 51.7 kg, SpO2 95%.   Exam: Gen Exam:not in any distress HEENT:atraumatic, normocephalic Chest: B/L clear to auscultation anteriorly CVS:S1S2 regular Abdomen:soft non tender, non distended Extremities:no edema Neurology: Difficult exam but appears to have mild left-sided weakness.  Speech appears to be dysarthric. Skin: no rash  Pertinent  Labs/Radiology:    Latest Ref Rng & Units 05/24/2023    6:54 AM 05/23/2023    4:50 AM 05/22/2023    8:24 PM  CBC  WBC 4.0 - 10.5 K/uL 11.4  12.8  9.8   Hemoglobin 13.0 - 17.0 g/dL 81.1  91.4  78.2   Hematocrit 39.0 - 52.0 % 40.7  39.1  42.5   Platelets 150 - 400 K/uL 275  264  317     Lab Results  Component Value Date   NA 138 05/24/2023   K 3.6 05/24/2023   CL 107 05/24/2023   CO2 20 (L) 05/24/2023     Assessment/Plan: Acute CVA Unclear whether this is embolic  Workup as above Some mild dysarthria and left-sided weakness Not on antiplatelets-due to concern that hemorrhagic contusion on numerous CT imaging studies-although not seen on MRI Await further input from neurology PT/OT/SLP eval.  C6 vertebral body fracture Secondary to mechanical fall Apparently patient was agitated on 9/20-he was unable to wear the hard collar This morning-son at bedside-he seems pretty calm-have asked nursing staff to see if we can retry and place hard collar Awaiting MRI C-spine ED MD had discussed with neurosurgery-who recommended that there was no emergent indication for neurosurgical evaluation-but had recommended a MRI C Spine-which is still pending at this time. Once obtained will need to d/w Neurosurgery about further recommendations  HTN All antihypertensives held-permissive hypertension for now  HLD Resume Crestor-increase to 40 mg daily  BPH Proscar Frequent bladder scans to ensure no retention  Underweight: Estimated body mass index is 17.33 kg/m as calculated from the following:   Height as of this encounter: 5\' 8"  (1.727 m).   Weight as  motion degraded examination. There is an area of abnormal diffusion restriction within the anterior right frontal lobe. 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 Normal flow voids at the skull base. Normal orbits. Clear paranasal sinuses and mastoid air cells. IMPRESSION: 1. Truncated and motion degraded examination. 2. Acute infarct within the anterior right frontal lobe. 3. Posterior right MCA territory encephalomalacia. 4. 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 Electronically Signed   By: Deatra Robinson M.D.   On: 05/23/2023 21:25   ECHOCARDIOGRAM COMPLETE  Result Date: 05/23/2023    ECHOCARDIOGRAM REPORT   Patient Name:   Eric [name] [employer] Date of Exam: 05/23/2023 Medical Rec #:  [id number]         Height:       68.0 in Accession #:    [id number]        Weight:       134.5 lb Date of Birth:  1941/02/17         BSA:          1.727 m Patient Age:    82 years          BP:           128/74 mmHg Patient Gender: M                  HR:           103 bpm. Exam Location:  Inpatient Procedure: 2D Echo, Cardiac Doppler and Color Doppler Indications:    Stroke  History:        Patient has prior history of Echocardiogram examinations, most                 recent 02/23/2023. Stroke and COPD; Risk Factors:Hypertension.  Sonographer:    Darlys Gales Referring Phys: [id number] Angie Fava  Sonographer Comments: Technically difficult study due to poor echo windows. Image acquisition challenging due to COPD and Image acquisition challenging due to uncooperative patient. IMPRESSIONS  1. Technically difficult study with very limited visualization of cardiac structures.  2. Left ventricular systolic function appears normal with an EF of 55% to 60%.  3. The right ventricle appears normal in structure and function.  4. The cardiac valves are not well visualized but there are no obvious abnormalities. FINDINGS  Left Ventricle: Left ventricular ejection fraction, by estimation, is 60 to 65%. The left ventricle has normal function. The left ventricle has no regional wall motion abnormalities. The left ventricular internal cavity size was normal in size. There is  no left ventricular hypertrophy. Left ventricular diastolic function could not be evaluated. Right Ventricle: The right ventricular size is normal. No increase in right ventricular wall thickness. Right ventricular systolic function is normal. Left Atrium: Left atrial size was normal in size. Right Atrium: Right atrial size was normal in size. Pericardium: There is no evidence of pericardial effusion. Mitral Valve: The mitral valve is grossly normal. No evidence of mitral valve regurgitation. No evidence of mitral valve stenosis. Tricuspid Valve: The tricuspid valve is not well visualized. Tricuspid valve regurgitation is not demonstrated. No evidence of tricuspid stenosis. Aortic Valve: The aortic valve is grossly normal. Aortic valve regurgitation is not visualized. No aortic stenosis  is present. Pulmonic Valve: The pulmonic valve was not assessed. Pulmonic valve regurgitation is not visualized. No evidence of pulmonic stenosis. Aorta: The aortic root was not well visualized. Venous: The inferior vena cava was not well visualized. IAS/Shunts: No atrial level shunt detected by color flow Doppler.  LEFT VENTRICLE PLAX  PROGRESS NOTE        PATIENT DETAILS Name: Eric [name] Age: 82 y.o. Sex: male Date of Birth: Apr 11, 1941 Admit Date: 05/22/2023 Admitting Physician Angie Fava, DO ZOX:WRUEAVWUJ, Georgina Quint, MD  Brief Summary: Patient is a 82 y.o.  male with history of CVA, HTN-who sustained a mechanical fall at home-upon further evaluation-was found to have acute CVA and C6 vertebral body fracture.  Patient was initially evaluated at [facility] and transferred to [facility] service at [facility] for further evaluation  Significant events: 9/19>> admit to [facility]  Significant studies: 6/23>> A1c: 5.8 9/19>> CT head: 0.9 cm focus of intraparenchymal hemorrhage right frontal lobe.  Acute to subacute cortical infarct left parietal lobe. 9/19 >>CT C-spine: Acute nondisplaced fracture through the anterior inferior corner of C6 vertebral body. 9/20>> CT head: Stable 1 cm anterior right middle frontal gyrus hemorrhagic contusion versus trace subarachnoid blood. 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 9/20>> MRI brain: Acute infarct anterior right frontal lobe. 9/20>> echo: EF 55-60% 9/20>> LDL: 112  Significant microbiology data: None  Procedures: None  Consults: Neurology  Subjective: Looks frail/disheveled-some dysarthria  Objective: Vitals: Blood pressure 117/78, pulse 86, temperature 98.9 F (37.2 C), temperature source Axillary, resp. rate 20, height 5\' 8"  (1.727 m), weight 51.7 kg, SpO2 95%.   Exam: Gen Exam:not in any distress HEENT:atraumatic, normocephalic Chest: B/L clear to auscultation anteriorly CVS:S1S2 regular Abdomen:soft non tender, non distended Extremities:no edema Neurology: Difficult exam but appears to have mild left-sided weakness.  Speech appears to be dysarthric. Skin: no rash  Pertinent  Labs/Radiology:    Latest Ref Rng & Units 05/24/2023    6:54 AM 05/23/2023    4:50 AM 05/22/2023    8:24 PM  CBC  WBC 4.0 - 10.5 K/uL 11.4  12.8  9.8   Hemoglobin 13.0 - 17.0 g/dL 81.1  91.4  78.2   Hematocrit 39.0 - 52.0 % 40.7  39.1  42.5   Platelets 150 - 400 K/uL 275  264  317     Lab Results  Component Value Date   NA 138 05/24/2023   K 3.6 05/24/2023   CL 107 05/24/2023   CO2 20 (L) 05/24/2023     Assessment/Plan: Acute CVA Unclear whether this is embolic  Workup as above Some mild dysarthria and left-sided weakness Not on antiplatelets-due to concern that hemorrhagic contusion on numerous CT imaging studies-although not seen on MRI Await further input from neurology PT/OT/SLP eval.  C6 vertebral body fracture Secondary to mechanical fall Apparently patient was agitated on 9/20-he was unable to wear the hard collar This morning-son at bedside-he seems pretty calm-have asked nursing staff to see if we can retry and place hard collar Awaiting MRI C-spine ED MD had discussed with neurosurgery-who recommended that there was no emergent indication for neurosurgical evaluation-but had recommended a MRI C Spine-which is still pending at this time. Once obtained will need to d/w Neurosurgery about further recommendations  HTN All antihypertensives held-permissive hypertension for now  HLD Resume Crestor-increase to 40 mg daily  BPH Proscar Frequent bladder scans to ensure no retention  Underweight: Estimated body mass index is 17.33 kg/m as calculated from the following:   Height as of this encounter: 5\' 8"  (1.727 m).   Weight as  motion degraded examination. There is an area of abnormal diffusion restriction within the anterior right frontal lobe. 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 Normal flow voids at the skull base. Normal orbits. Clear paranasal sinuses and mastoid air cells. IMPRESSION: 1. Truncated and motion degraded examination. 2. Acute infarct within the anterior right frontal lobe. 3. Posterior right MCA territory encephalomalacia. 4. 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 Electronically Signed   By: Deatra Robinson M.D.   On: 05/23/2023 21:25   ECHOCARDIOGRAM COMPLETE  Result Date: 05/23/2023    ECHOCARDIOGRAM REPORT   Patient Name:   Eric [name] [employer] Date of Exam: 05/23/2023 Medical Rec #:  [id number]         Height:       68.0 in Accession #:    [id number]        Weight:       134.5 lb Date of Birth:  1941/02/17         BSA:          1.727 m Patient Age:    82 years          BP:           128/74 mmHg Patient Gender: M                  HR:           103 bpm. Exam Location:  Inpatient Procedure: 2D Echo, Cardiac Doppler and Color Doppler Indications:    Stroke  History:        Patient has prior history of Echocardiogram examinations, most                 recent 02/23/2023. Stroke and COPD; Risk Factors:Hypertension.  Sonographer:    Darlys Gales Referring Phys: [id number] Angie Fava  Sonographer Comments: Technically difficult study due to poor echo windows. Image acquisition challenging due to COPD and Image acquisition challenging due to uncooperative patient. IMPRESSIONS  1. Technically difficult study with very limited visualization of cardiac structures.  2. Left ventricular systolic function appears normal with an EF of 55% to 60%.  3. The right ventricle appears normal in structure and function.  4. The cardiac valves are not well visualized but there are no obvious abnormalities. FINDINGS  Left Ventricle: Left ventricular ejection fraction, by estimation, is 60 to 65%. The left ventricle has normal function. The left ventricle has no regional wall motion abnormalities. The left ventricular internal cavity size was normal in size. There is  no left ventricular hypertrophy. Left ventricular diastolic function could not be evaluated. Right Ventricle: The right ventricular size is normal. No increase in right ventricular wall thickness. Right ventricular systolic function is normal. Left Atrium: Left atrial size was normal in size. Right Atrium: Right atrial size was normal in size. Pericardium: There is no evidence of pericardial effusion. Mitral Valve: The mitral valve is grossly normal. No evidence of mitral valve regurgitation. No evidence of mitral valve stenosis. Tricuspid Valve: The tricuspid valve is not well visualized. Tricuspid valve regurgitation is not demonstrated. No evidence of tricuspid stenosis. Aortic Valve: The aortic valve is grossly normal. Aortic valve regurgitation is not visualized. No aortic stenosis  is present. Pulmonic Valve: The pulmonic valve was not assessed. Pulmonic valve regurgitation is not visualized. No evidence of pulmonic stenosis. Aorta: The aortic root was not well visualized. Venous: The inferior vena cava was not well visualized. IAS/Shunts: No atrial level shunt detected by color flow Doppler.  LEFT VENTRICLE PLAX  motion degraded examination. There is an area of abnormal diffusion restriction within the anterior right frontal lobe. 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 Normal flow voids at the skull base. Normal orbits. Clear paranasal sinuses and mastoid air cells. IMPRESSION: 1. Truncated and motion degraded examination. 2. Acute infarct within the anterior right frontal lobe. 3. Posterior right MCA territory encephalomalacia. 4. 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 Electronically Signed   By: Deatra Robinson M.D.   On: 05/23/2023 21:25   ECHOCARDIOGRAM COMPLETE  Result Date: 05/23/2023    ECHOCARDIOGRAM REPORT   Patient Name:   Eric [name] [employer] Date of Exam: 05/23/2023 Medical Rec #:  [id number]         Height:       68.0 in Accession #:    [id number]        Weight:       134.5 lb Date of Birth:  1941/02/17         BSA:          1.727 m Patient Age:    82 years          BP:           128/74 mmHg Patient Gender: M                  HR:           103 bpm. Exam Location:  Inpatient Procedure: 2D Echo, Cardiac Doppler and Color Doppler Indications:    Stroke  History:        Patient has prior history of Echocardiogram examinations, most                 recent 02/23/2023. Stroke and COPD; Risk Factors:Hypertension.  Sonographer:    Darlys Gales Referring Phys: [id number] Angie Fava  Sonographer Comments: Technically difficult study due to poor echo windows. Image acquisition challenging due to COPD and Image acquisition challenging due to uncooperative patient. IMPRESSIONS  1. Technically difficult study with very limited visualization of cardiac structures.  2. Left ventricular systolic function appears normal with an EF of 55% to 60%.  3. The right ventricle appears normal in structure and function.  4. The cardiac valves are not well visualized but there are no obvious abnormalities. FINDINGS  Left Ventricle: Left ventricular ejection fraction, by estimation, is 60 to 65%. The left ventricle has normal function. The left ventricle has no regional wall motion abnormalities. The left ventricular internal cavity size was normal in size. There is  no left ventricular hypertrophy. Left ventricular diastolic function could not be evaluated. Right Ventricle: The right ventricular size is normal. No increase in right ventricular wall thickness. Right ventricular systolic function is normal. Left Atrium: Left atrial size was normal in size. Right Atrium: Right atrial size was normal in size. Pericardium: There is no evidence of pericardial effusion. Mitral Valve: The mitral valve is grossly normal. No evidence of mitral valve regurgitation. No evidence of mitral valve stenosis. Tricuspid Valve: The tricuspid valve is not well visualized. Tricuspid valve regurgitation is not demonstrated. No evidence of tricuspid stenosis. Aortic Valve: The aortic valve is grossly normal. Aortic valve regurgitation is not visualized. No aortic stenosis  is present. Pulmonic Valve: The pulmonic valve was not assessed. Pulmonic valve regurgitation is not visualized. No evidence of pulmonic stenosis. Aorta: The aortic root was not well visualized. Venous: The inferior vena cava was not well visualized. IAS/Shunts: No atrial level shunt detected by color flow Doppler.  LEFT VENTRICLE PLAX  motion degraded examination. There is an area of abnormal diffusion restriction within the anterior right frontal lobe. 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 Normal flow voids at the skull base. Normal orbits. Clear paranasal sinuses and mastoid air cells. IMPRESSION: 1. Truncated and motion degraded examination. 2. Acute infarct within the anterior right frontal lobe. 3. Posterior right MCA territory encephalomalacia. 4. 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 Electronically Signed   By: Deatra Robinson M.D.   On: 05/23/2023 21:25   ECHOCARDIOGRAM COMPLETE  Result Date: 05/23/2023    ECHOCARDIOGRAM REPORT   Patient Name:   Eric [name] [employer] Date of Exam: 05/23/2023 Medical Rec #:  [id number]         Height:       68.0 in Accession #:    [id number]        Weight:       134.5 lb Date of Birth:  1941/02/17         BSA:          1.727 m Patient Age:    82 years          BP:           128/74 mmHg Patient Gender: M                  HR:           103 bpm. Exam Location:  Inpatient Procedure: 2D Echo, Cardiac Doppler and Color Doppler Indications:    Stroke  History:        Patient has prior history of Echocardiogram examinations, most                 recent 02/23/2023. Stroke and COPD; Risk Factors:Hypertension.  Sonographer:    Darlys Gales Referring Phys: [id number] Angie Fava  Sonographer Comments: Technically difficult study due to poor echo windows. Image acquisition challenging due to COPD and Image acquisition challenging due to uncooperative patient. IMPRESSIONS  1. Technically difficult study with very limited visualization of cardiac structures.  2. Left ventricular systolic function appears normal with an EF of 55% to 60%.  3. The right ventricle appears normal in structure and function.  4. The cardiac valves are not well visualized but there are no obvious abnormalities. FINDINGS  Left Ventricle: Left ventricular ejection fraction, by estimation, is 60 to 65%. The left ventricle has normal function. The left ventricle has no regional wall motion abnormalities. The left ventricular internal cavity size was normal in size. There is  no left ventricular hypertrophy. Left ventricular diastolic function could not be evaluated. Right Ventricle: The right ventricular size is normal. No increase in right ventricular wall thickness. Right ventricular systolic function is normal. Left Atrium: Left atrial size was normal in size. Right Atrium: Right atrial size was normal in size. Pericardium: There is no evidence of pericardial effusion. Mitral Valve: The mitral valve is grossly normal. No evidence of mitral valve regurgitation. No evidence of mitral valve stenosis. Tricuspid Valve: The tricuspid valve is not well visualized. Tricuspid valve regurgitation is not demonstrated. No evidence of tricuspid stenosis. Aortic Valve: The aortic valve is grossly normal. Aortic valve regurgitation is not visualized. No aortic stenosis  is present. Pulmonic Valve: The pulmonic valve was not assessed. Pulmonic valve regurgitation is not visualized. No evidence of pulmonic stenosis. Aorta: The aortic root was not well visualized. Venous: The inferior vena cava was not well visualized. IAS/Shunts: No atrial level shunt detected by color flow Doppler.  LEFT VENTRICLE PLAX

## 2023-05-25 ENCOUNTER — Inpatient Hospital Stay (HOSPITAL_COMMUNITY): Payer: Medicare Other

## 2023-05-25 DIAGNOSIS — I639 Cerebral infarction, unspecified: Secondary | ICD-10-CM | POA: Diagnosis not present

## 2023-05-25 LAB — BASIC METABOLIC PANEL WITH GFR
Anion gap: 14 (ref 5–15)
BUN: 20 mg/dL (ref 8–23)
CO2: 18 mmol/L — ABNORMAL LOW (ref 22–32)
Calcium: 9 mg/dL (ref 8.9–10.3)
Chloride: 104 mmol/L (ref 98–111)
Creatinine, Ser: 1.46 mg/dL — ABNORMAL HIGH (ref 0.61–1.24)
GFR, Estimated: 48 mL/min — ABNORMAL LOW
Glucose, Bld: 125 mg/dL — ABNORMAL HIGH (ref 70–99)
Potassium: 4 mmol/L (ref 3.5–5.1)
Sodium: 136 mmol/L (ref 135–145)

## 2023-05-25 LAB — CBC
HCT: 40.9 % (ref 39.0–52.0)
Hemoglobin: 14.1 g/dL (ref 13.0–17.0)
MCH: 27.8 pg (ref 26.0–34.0)
MCHC: 34.5 g/dL (ref 30.0–36.0)
MCV: 80.7 fL (ref 80.0–100.0)
Platelets: 295 K/uL (ref 150–400)
RBC: 5.07 MIL/uL (ref 4.22–5.81)
RDW: 15 % (ref 11.5–15.5)
WBC: 13.6 K/uL — ABNORMAL HIGH (ref 4.0–10.5)
nRBC: 0 % (ref 0.0–0.2)

## 2023-05-25 LAB — GLUCOSE, CAPILLARY: Glucose-Capillary: 133 mg/dL — ABNORMAL HIGH (ref 70–99)

## 2023-05-25 LAB — MAGNESIUM: Magnesium: 1.8 mg/dL (ref 1.7–2.4)

## 2023-05-25 MED ORDER — BISACODYL 10 MG RE SUPP
10.0000 mg | Freq: Once | RECTAL | Status: AC
  Administered 2023-05-25: 10 mg via RECTAL
  Filled 2023-05-25: qty 1

## 2023-05-25 MED ORDER — TAMSULOSIN HCL 0.4 MG PO CAPS
0.4000 mg | ORAL_CAPSULE | Freq: Every day | ORAL | Status: DC
  Administered 2023-05-25 – 2023-06-12 (×8): 0.4 mg via ORAL
  Filled 2023-05-25 (×9): qty 1

## 2023-05-25 MED ORDER — LIDOCAINE HCL URETHRAL/MUCOSAL 2 % EX GEL
1.0000 | Freq: Once | CUTANEOUS | Status: DC
  Filled 2023-05-25: qty 6

## 2023-05-25 MED ORDER — ASPIRIN 81 MG PO TBEC
81.0000 mg | DELAYED_RELEASE_TABLET | Freq: Every day | ORAL | Status: DC
  Administered 2023-05-25 – 2023-05-26 (×2): 81 mg via ORAL
  Filled 2023-05-25 (×2): qty 1

## 2023-05-25 NOTE — Progress Notes (Signed)
Pt complaining of severe back pain. Pt is sweating. RR 24 HR 111 BP 165/92. Temp 98.1. Also has a intermittent tremor. On call provider notified. No new orders.

## 2023-05-25 NOTE — Progress Notes (Addendum)
STROKE TEAM PROGRESS NOTE   BRIEF HPI [name] is a 82 y.o. male with history of HTN, HLD, prior stroke with residual left side weakness- on 81 mg asa, BPH, COPD who presents to ED for evaluation of fall and AMS.   INTERIM HISTORY/SUBJECTIVE Daughter in the room. Patient lying in bed.  C-collar in place. Feeling okay today. MRI done overnight, motion degraded.  Start DAPT on Tuesday- follow up with [facility] in a month  OBJECTIVE  CBC    Component Value Date/Time   WBC 11.4 (H) 05/24/2023 0654   RBC 5.08 05/24/2023 0654   HGB 13.6 05/24/2023 0654   HCT 40.7 05/24/2023 0654   PLT 275 05/24/2023 0654   MCV 80.1 05/24/2023 0654   MCH 26.8 05/24/2023 0654   MCHC 33.4 05/24/2023 0654   RDW 15.2 05/24/2023 0654   LYMPHSABS 1.8 05/23/2023 0450   MONOABS 1.1 (H) 05/23/2023 0450   EOSABS 0.0 05/23/2023 0450   BASOSABS 0.1 05/23/2023 0450    BMET    Component Value Date/Time   NA 138 05/24/2023 0654   K 3.6 05/24/2023 0654   CL 107 05/24/2023 0654   CO2 20 (L) 05/24/2023 0654   GLUCOSE 109 (H) 05/24/2023 0654   BUN 10 05/24/2023 0654   CREATININE 1.22 05/24/2023 0654   CALCIUM 9.6 05/24/2023 0654   GFRNONAA 59 (L) 05/24/2023 0654    IMAGING past 24 hours DG CHEST PORT 1 VIEW  Result Date: 05/24/2023 CLINICAL DATA:  Tachycardia EXAM: PORTABLE CHEST 1 VIEW COMPARISON:  05/23/2023 FINDINGS: The heart size and mediastinal contours are within normal limits. Both lungs are clear. The visualized skeletal structures are unremarkable. IMPRESSION: No active disease. Electronically Signed   By: Helyn Numbers M.D.   On: 05/24/2023 22:10   MR CERVICAL SPINE WO CONTRAST  Result Date: 05/24/2023 CLINICAL DATA:  C6 vertebral body fracture on CT cervical spine after fall EXAM: MRI CERVICAL SPINE WITHOUT CONTRAST TECHNIQUE: Multiplanar, multisequence MR imaging of the cervical spine was performed. No intravenous contrast was administered. COMPARISON:  No prior MRI available, correlation  is made with CT cervical spine 05/22/2023 FINDINGS: Evaluation is significantly limited by motion artifact. Alignment: Reversal of the normal cervical lordosis. Trace stepwise retrolisthesis of C3 on C4, C4 on C5, and C5 on C6. Vertebrae: The known fracture off the anterior inferior aspect of C1 is not well seen on these images due to motion. Cord: Best seen on the sagittal T2 sequence, no definite cord signal abnormality. No evidence of significant epidural collection. Posterior Fossa, vertebral arteries, paraspinal tissues: Prevertebral fluid, likely related to known fracture. Evaluation for ligamentous disruption at C6-C7 is particularly motion limited. Disc levels: The axial sequences are essentially nondiagnostic. 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 IMPRESSION: 1. Evaluation is significantly limited by motion artifact. The known fracture off the anterior inferior aspect of C1 is not well seen on these images due to motion. 2. 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 3. Prevertebral fluid, likely related to known fracture. 4. Evaluation for ligamentous disruption at C6-C7 is particularly motion limited. Electronically Signed   By: Wiliam Ke M.D.   On: 05/24/2023 21:24   EEG adult  Result Date: 05/24/2023 Charlsie Quest, MD     05/24/2023  7:34 PM Patient Name: Eric [name]  CAID HENLINE MRN: [id number] Epilepsy Attending: Charlsie Quest Referring Physician/Provider: Elmer Picker, NP Date: 05/24/2023 Duration: 24.51 mins Patient history: 21y M with ams getting eeg to evaluate for seizure Level of alertness: Awake AEDs during EEG study: Ativan Technical aspects: This EEG study was done with scalp electrodes positioned according to the 10-20 International system of electrode placement.  Electrical activity was reviewed with band pass filter of 1-70Hz , sensitivity of 7 uV/mm, display speed of 53mm/sec with a 60Hz  notched filter applied as appropriate. EEG data were recorded continuously and digitally stored.  Video monitoring was available and reviewed as appropriate. Description: The posterior dominant rhythm consists of 8-9 Hz activity of moderate voltage (25-35 uV) seen predominantly in posterior head regions, asymmetric  ( right<left) and reactive to eye opening and eye closing. EEG showed continuous rhythmic 3 to 6 Hz theta-delta slowing in right temporal region. Intermittent generalized 3-6hz  theta-delta slowing was also noted. Hyperventilation and photic stimulation were not performed.   ABNORMALITY - Intermittent slow, generalized - Continuous slow, right temporal region IMPRESSION: This study is  suggestive of cortical dysfunction arising from right temporal region likely secondary to underlying structural abnormality. Additionally there is mild diffuse encephalopathy. No seizures or epileptiform discharges were seen throughout the recording. Charlsie Quest    Vitals:   05/24/23 2344 05/25/23 0348 05/25/23 0500 05/25/23 0527  BP: 103/64 (!) 157/77  (!) 154/90  Pulse: (!) 104 (!) 101  85  Resp: 20 18  16   Temp: 97.8 F (36.6 C) 99.4 F (37.4 C)  97.8 F (36.6 C)  TempSrc: Axillary Axillary  Axillary  SpO2: 94% 94%  96%  Weight:   59.6 kg   Height:         PHYSICAL EXAM General:  Alert, well-nourished, well-developed patient in no acute distress. C collar in place Psych:  Mood and affect appropriate for situation CV: Regular rate and rhythm on monitor Respiratory:  Regular, unlabored respirations on room air GI: Abdomen soft and nontender   NEURO:  Mental Status: drowsy, responds to voice. Responds to name, states he is 57, unable to state year or day able to state "[facility]" as place.  He was able to identify his son.  He did not know the president but asked by  something he would usually know for family. Language: speech is dysarthric and incomprehensible most times.    Cranial Nerves: PERRL EOM- left eye difficulty crossing midline on lateral gaze, visual fields inconsistent blink to threat bilaterally, but seems to be less on the left (poor vision at baseline per family), subtle left facial asymmetry ,  facial sensation intact, hearing intact, tongue/uvula/soft palate midline, normal  sternocleidomastoid and trapezius muscle strength. No evidence of tongue atrophy or fibrillations  Motor:  bilateral uppers 4/5, weaker hand grip on left, 4/5 in bilateral lowers  Tone: is normal and bulk is abnormal Sensation- Intact to light touch bilaterally Coordination: unable to assess Gait- deferred   ASSESSMENT/PLAN  Acute Ischemic Infarct:  Subacute left parietal infarct with possible hemorrhage infarct vs hemorrhagic contusion in right frontal region Etiology:  likely cardio embolic vs vessel disease Code Stroke CT head - small 1 cm anterior right middle frontal gyrus hemorrhagic contusion versus trace subarachnoid blood; subacute appearing left parietal lobe infarct  CTA head & neck 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 Bilateral carotid bifurcation atherosclerosis without focal stenosis. MRI  Acute infarct within the anterior right frontal lobe.  Posterior  right MCA territory encephalomalacia. 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 9/21- EEG- This study is  suggestive of cortical dysfunction arising from right temporal region likely secondary to underlying structural abnormality. Additionally there is mild diffuse encephalopathy. No seizures or epileptiform discharges were seen throughout the recording.  2D Echo EF 55% LDL 39 HgbA1c 5.8 VTE prophylaxis - SCDs aspirin 81 mg daily prior to admission, now on No antithrombotic due to hemorrhage. Therapy recommendations:  Pending   Disposition:  Pending   Hx of Stroke/TIA 02/22/2023 - left parietal occipital small infarct, embolic pattern etiology unclear, concerning for cardioembolic source versus large vessel disease  CT 6/22 small right frontal SAH Repeat CT 6/23 no hemorrhage MRI limited study, left parietal occipital small infarct CTA head and neck bilateral carotid siphon and bulb atherosclerosis 2D Echo EF 45 to 50% LE venous Doppler no DVT LDL 112 30-day cardiac event monitor negative for A-fib Has had falls, high risk for oral anticoagulation   Hypertension Home meds:  amlodipine- benazepril Stable Blood pressure goal: Normotension   Hyperlipidemia Home meds:  Crestor, resumed in hospital LDL 39, goal < 70 Continue statin at discharge  Dysphagia Patient has post-stroke dysphagia, SLP consulted    Diet   Diet clear liquid Room service appropriate? Yes; Fluid consistency: Thin   Advance diet as tolerated   Other Active Problems C6 fracture C-collar in place. MRI C spine Evaluation is significantly limited by motion artifact. The known fracture off the anterior inferior aspect of C1 is not well seen on these images due to motion. 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 Prevertebral fluid, likely related to known fracture. Evaluation for ligamentous disruption at C6-C7 is particularly motion limited. Acute encephalopathy [id number]  TSH 1.019 UA negative, afebrile  BPH Elevated troponin   Hospital day # 3  Patient seen and examined by NP/APP with MD. MD to update note as needed.   Elmer Picker, DNP, FNP-BC [facility] Pager: [phone]  ATTENDING ATTESTATION:  82 year old with new right frontal ischemic CVA.  Old left cerebral artery stroke and right MCA encephalomalacia no mention of hemorrhage on MRI brain.  Repeat CT also did not show hemorrhage.MRI  C-spine completed today motion graded unremarkable.  C-collar removed.  Can consult neurosurgery outpatient for C6 vertebral body fracture and follow-up.  Start aspirin today and if stable can add Plavix for DAPT therapy on Tuesday due to contusion.  Can follow-up closely in stroke clinic with Dr. Pearlean Brownie in 6 to 8 weeks.  Neurology to sign off please call questions.  Dr. Viviann Spare evaluated pt independently, reviewed imaging, chart, labs. Discussed and formulated plan with the Resident/APP. Changes were made to the note where appropriate. Please see APP/resident note above for details.    Juliana Boling,MD     To contact Stroke Continuity provider, please refer to [email or web address]. After hours, contact General Neurology

## 2023-05-25 NOTE — Plan of Care (Signed)
Problem: Education: Goal: Knowledge of General Education information will improve Description Including pain rating scale, medication(s)/side effects and non-pharmacologic comfort measures Outcome: Progressing   Problem: Clinical Measurements: Goal: Ability to maintain clinical measurements within normal limits will improve Outcome: Progressing   Problem: Activity: Goal: Risk for activity intolerance will decrease Outcome: Progressing   Problem: Coping: Goal: Level of anxiety will decrease Outcome: Progressing   Problem: Elimination: Goal: Will not experience complications related to urinary retention Outcome: Progressing   Problem: Pain Managment: Goal: General experience of comfort will improve Outcome: Progressing   Problem: Safety: Goal: Ability to remain free from injury will improve Outcome: Progressing   Problem: Skin Integrity: Goal: Risk for impaired skin integrity will decrease Outcome: Progressing

## 2023-05-25 NOTE — Progress Notes (Signed)
Speech Language Pathology Treatment: Dysphagia  Patient Details Name: Eric [name] MRN: [id number] DOB: [birth year] Today's Date: 05/25/2023 Time: 1250-1305 SLP Time Calculation (min) (ACUTE ONLY): 15 min  Assessment / Plan / Recommendation Clinical Impression  Patient seen by SLP for skilled treatment focused on dysphagia goals. His daughter was present in the room as well. RN reports patient tolerated pills in applesauce without observed difficulty this morning. Patient lying in bed with eyes closed but started to wake to voice then fully awaken when SLP raising HOB. When [facility] approximately 45 degrees, patient started to wince in pain and telling SLP that his stomach was hurting. SLP then lowered HOB to 25 degrees with patient reporting improvement in pain. Of note, he had catheter placed at bedside by urologist approximately an hour prior to this session. SLP was able to put patient in reverse trendelenburg for PO intake. He requested water which he then drank via large straw sips. No overt s/s aspiration observed but appearance of reduced hyolaryngeal movement was observed. No changes in voice or vitals. Patient politely declined to have any other PO's offered. SLP recommending advance diet slightly from clear liquids to full liquids and will continue to follow for toleration, advancement and determination of need for MBS.    HPI HPI: Eric [name] presents to [facility] hospital on 05/23/2023 as a transfer from [facility] ED after a fall. CT head suggestive of acute to subacute L parietal infarct, as well as IPH in R frontal lobe. Pt also found to have C6 vertebral body fx. PMH includes CVA, HTN. Pt is mild-moderately dysarthric      SLP Plan  Continue with current plan of care      Recommendations for follow up therapy are one component of a multi-disciplinary discharge planning process, led by the attending physician.  Recommendations may be updated based on patient status, additional functional  criteria and insurance authorization.    Recommendations  Diet recommendations: Thin liquid;Other(comment) (full liquids) Liquids provided via: Cup;Straw Medication Administration: Whole meds with puree Supervision: Full supervision/cueing for compensatory strategies;Trained caregiver to feed patient;Staff to assist with self feeding Compensations: Slow rate;Small sips/bites;Clear throat after each swallow Postural Changes and/or Swallow Maneuvers: Seated upright 90 degrees                  Oral care BID   Frequent or constant Supervision/Assistance Dysphagia, unspecified (R13.10)     Continue with current plan of care   Angela Nevin, MA, CCC-SLP Speech Therapy

## 2023-05-25 NOTE — TOC CAGE-AID Note (Signed)
Transition of Care [facility]) - CAGE-AID Screening   Patient Details  Name: Eric [name] MRN: [id number] Date of Birth: November 21, 1940  Transition of Care [facility]) CM/SW Contact:    Janora Norlander, RN Phone Number: [phone] 05/25/2023, 6:45 PM   Clinical Narrative: Pt is an acute ischemic stroke pt who also had a fall and has cervical stenosis.  Pt denies alcohol or drug use.  Screening complete.   CAGE-AID Screening:    Have You Ever Felt You Ought to Cut Down on Your Drinking or Drug Use?: No Have People Annoyed You By Critizing Your Drinking Or Drug Use?: No Have You Felt Bad Or Guilty About Your Drinking Or Drug Use?: No Have You Ever Had a Drink or Used Drugs First Thing In The Morning to Steady Your Nerves or to Get Rid of a Hangover?: No CAGE-AID Score: 0  Substance Abuse Education Offered: No

## 2023-05-25 NOTE — Consult Note (Signed)
Neurosurgery Consultation  Reason for Consult: cervical fracture and stenosis Referring Physician: Thedore Mins MD   CC: AMS  HPI: This is a 81 y.o. male presented to the ED with worsening AMS after a fall. He has a h/o CVA in June 2024 and has been declining since. Per the notes it appears he was complaining of some RUE weakness while in the ED, however at this time he is very altered and cannot communicate. No recent use of anticoagulants.   ROS: A 14 point ROS was performed and is negative except as noted in the HPI.   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 His smoking use included cigarettes. He has never used smokeless tobacco. He reports that he does not drink alcohol and does not use drugs.  Exam: Vital signs in last 24 hours: Temp:  [97.6 F (36.4 C)-99.4 F (37.4 C)] 98.3 F (36.8 C) (09/22 0800) Pulse Rate:  [85-134] 85 (09/22 0527) Resp:  [16-35] 16 (09/22 0527) BP: (103-157)/(64-123) 154/90 (09/22 0527) SpO2:  [92 %-98 %] 93 % (09/22 0800) Weight:  [59.6 kg] 59.6 kg (09/22 0500) General: Altered, unable to answer questions correctly  Head: Normocephalic and atruamatic HEENT: Neck supple Pulmonary: breathing room air comfortably, no evidence of increased work of breathing Cardiac: RRR Abdomen: S NT ND Extremities: Warm and well perfused x4 Neuro: exam limited d/t AMS - PERRL, EOMI, FS, Strength 5/5 x4    Assessment and Plan: 82 y.o. male with worsening AMS after a fall. Cervical CT and MRI were personally reviewed,  which shows a small fracture through the anterior inferior osteophyte on C6 without signs of ligamentous disruption. There is moderate / severe spinal stenosis at C5-6 and mild / moderate spinal stenosis at C6-7, however the MRI is quite motion degraded.    -no acute neurosurgical intervention indicated at this time -small osteophyte fracture is stable - can remove hard cervical collar and perform activity as tolerated  -given his normal strength on exam his stenosis appears to be asymptomatic at this time  -please call with any concerns or questions  Iran Sizer, PA-C 05/25/23 9:51 AM [facility]

## 2023-05-25 NOTE — Consult Note (Addendum)
H&P Physician requesting consult: Susa Raring  Chief Complaint: Urinary retention, difficult Foley  History of Present Illness: 82 year old male currently admitted with acute CVA and C6 vertebral body fracture secondary to fall.  Overnight had urinary retention requiring in and out catheterization.  This morning unable to pass catheter.  Nursing unable to place a catheter.  Wife provides some of the history.  She took him off tamsulosin because she felt like he was voiding well.  Now on oxybutynin.  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 For arthritis in back   unk   amLODipine-benazepril (LOTREL) 5-20 MG capsule TAKE 1 CAPSULE BY MOUTH EVERY DAY (Patient taking differently: Take 1 capsule by mouth at bedtime.) 90 capsule 3 05/22/2023   BAYER LOW DOSE 81 MG tablet Take 81 mg by mouth in the morning. Swallow whole.   05/22/2023 at 0800   Cyanocobalamin (VITAMIN B-12) 1000 MCG SUBL Place 1 tablet (1,000 mcg total) under the tongue daily. (Patient taking differently: Place 1 tablet under the tongue at bedtime.) 100 tablet 3 05/21/2023 at pm   ferrous sulfate 325 (65 FE) MG tablet Take 1 tablet (325 mg total) by mouth 2 (two) times daily with a meal. (Patient taking differently: Take 325 mg by mouth at bedtime.) 100 tablet 2 05/22/2023   finasteride (PROSCAR) 5 MG tablet Take 1 tablet (5 mg total) by mouth daily. 90 tablet 3 05/22/2023   oxybutynin  (DITROPAN-XL) 5 MG 24 hr tablet TAKE 1 TABLET (5 MG TOTAL) BY MOUTH AT BEDTIME. FOR FREQUENT URINATION 90 tablet 2 05/22/2023   potassium chloride SA (KLOR-CON M) 20 MEQ tablet Take 1 tablet (20 mEq total) by mouth daily. 30 tablet 5 05/22/2023   rosuvastatin (CRESTOR) 20 MG tablet Take 1 tablet (20 mg total) by mouth daily. 30 tablet 4 05/22/2023   traMADol (ULTRAM) 50 MG tablet TAKE 1 TABLET 3 TIMES A DAY AS NEEDED SEVERE PAIN (Patient taking differently: Take 50 mg by mouth 3 (three) times daily.) 90 tablet 3 05/22/2023   aspirin EC 81 MG tablet Take 1 tablet (81 mg total) by mouth daily. Swallow whole. (Patient not taking: Reported on 05/22/2023) 30 tablet 12 Not Taking   pantoprazole (PROTONIX) 40 MG tablet Take 1 tablet (40 mg total) by mouth daily. 30 tablet 3 unk   Allergies:  Allergies  Allergen Reactions   Hctz [Hydrochlorothiazide] Other (See Comments)    "Peeing too much"    Family History  Problem Relation Age of Onset   Arthritis Mother    Diabetes Father    Diabetes Other    Hypertension Other    Social History:  reports that he quit smoking about 23 years ago. His smoking use included cigarettes. He has never used smokeless tobacco. He reports that he does not drink alcohol and does not use drugs.  ROS: A complete review of  systems was performed.  All systems are negative except for pertinent findings as noted. ROS   Physical Exam:  Vital signs in last 24 hours: Temp:  [97.6 F (36.4 C)-99.4 F (37.4 C)] 98.3 F (36.8 C) (09/22 0800) Pulse Rate:  [85-134] 85 (09/22 0527) Resp:  [16-35] 16 (09/22 0527) BP: (103-157)/(64-123) 154/90 (09/22 0527) SpO2:  [92 %-96 %] 93 % (09/22 0800) Weight:  [59.6 kg] 59.6 kg (09/22 0500) General: Fatigued.  No acute distress HEENT: Normocephalic, atraumatic Neck: No JVD or lymphadenopathy Cardiovascular: Regular rate and rhythm Lungs: Regular rate and effort Abdomen: Soft, nontender, nondistended, no abdominal  masses Genitourinary: Uncircumcised phallus.  Bilateral testicles descended without masses.  Laboratory Data:  No results found for this or any previous visit (from the past 24 hour(s)). No results found for this or any previous visit (from the past 240 hour(s)). Creatinine: Recent Labs    05/20/23 0124 05/22/23 2024 05/23/23 0450 05/24/23 0654  CREATININE 1.16 0.97 0.99 1.22   Under sterile conditions, I advanced an 26 French coud catheter into the urethra and into the bladder.  There was return of clear yellow urine.  I secured the catheter to his right leg.   Impression/Assessment:  Urinary retention Difficult Foley catheter  Plan:  Start Flomax.  Continue finasteride.  We will not restart oxybutynin as it can contribute to urinary retention.  Continue Foley catheter for 1 week at which point he can undergo voiding trial.  Ray Church, III 05/25/2023, 12:29 PM

## 2023-05-25 NOTE — Progress Notes (Signed)
PROGRESS NOTE        PATIENT DETAILS Name: Eric [name] Age: 82 y.o. Sex: male Date of Birth: November 02, 1940 Admit Date: 05/22/2023 Admitting Physician Angie Fava, DO ZOX:[id number], Georgina Quint, MD  Brief Summary: Patient is a 82 y.o.  male with history of CVA, HTN-who sustained a mechanical fall at home-upon further evaluation-was found to have acute CVA and C6 vertebral body fracture.  Patient was initially evaluated at [facility] and transferred to [facility] service at [facility] for further evaluation  Significant events: 9/19>> admit to [facility]  Significant studies: 6/23>> A1c: 5.8 9/19>> CT head: 0.9 cm focus of intraparenchymal hemorrhage right frontal lobe.  Acute to subacute cortical infarct left parietal lobe. 9/19 >>CT C-spine: Acute nondisplaced fracture through the anterior inferior corner of C6 vertebral body. 9/20>> CT head: Stable 1 cm anterior right middle frontal gyrus hemorrhagic contusion versus trace subarachnoid blood. 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 9/20>> MRI brain: Acute infarct anterior right frontal lobe. 9/20>> echo: EF 55-60% 9/20>> LDL: 112  Significant microbiology data: None  Procedures: None  Consults: Neurology  Subjective:  Patient in bed, no headache or chest pain, has some abdominal discomfort wants to go to the bathroom, no neck pain.  Objective: Vitals: Blood pressure (!) 154/90, pulse 85, temperature 98.3 F (36.8 C), temperature source Axillary, resp. rate 16, height 5\' 8"  (1.727 m), weight 59.6 kg, SpO2 93%.   Exam:  Awake Alert, No new F.N deficits, Normal affect [place].AT,PERRAL Supple Neck, No JVD,   Symmetrical Chest wall movement, Good air movement bilaterally, CTAB RRR,No Gallops, Rubs or new Murmurs,  +ve B.Sounds, Abd Soft,   No Cyanosis, Clubbing or edema     Assessment/Plan:  Acute CVA Unclear whether this is embolic  Workup as above Some mild dysarthria and left-sided weakness Not on antiplatelets-due to concern that hemorrhagic contusion on numerous CT imaging studies-although not seen on MRI, neurology following defer management to them Await further input from neurology PT/OT/SLP eval.  C6 vertebral body fracture CT and MRI although limited reviewed, seen by neurosurgery, continue supportive care per neurosurgery.  Had collar removed by neurosurgery on 05/25/2023.  Continue to monitor with PT OT.  HTN All antihypertensives held-permissive hypertension for now  HLD Resume Crestor-increase to 40 mg daily  BPH Proscar Develop retention overnight and again this morning, coud catheter to be placed 05/25/2023.  Underweight: Estimated body mass index is 19.98 kg/m as calculated from the following:   Height as of this encounter: 5\' 8"  (1.727 m).   Weight as of this encounter: 59.6 kg.   Code status:   Code Status: Full Code   DVT Prophylaxis: SCDs Start: 05/22/23 2315   Family Communication: Son and wife at bedside 05/25/2023.   Disposition Plan: Status is: Inpatient Remains inpatient appropriate because: Severity of illness   Planned Discharge Destination:Home health   Diet: Diet Order             Diet clear liquid Room service appropriate? Yes; Fluid consistency: Thin  Diet effective now                     Antimicrobial agents: Anti-infectives (From admission, onward)    None        MEDICATIONS: Scheduled Meds:  dexamethasone (DECADRON) injection  8 mg Intravenous Q8H   finasteride  5 mg Oral Daily   lidocaine  1 Application Urethral Once   rosuvastatin  40 mg Oral Daily   Continuous Infusions:  lactated ringers 1,000 mL with potassium chloride 20 mEq infusion 50 mL/hr at 05/24/23 2222   PRN Meds:.acetaminophen **OR** acetaminophen, labetalol, melatonin, ondansetron (ZOFRAN) IV,  polyethylene glycol   I have personally reviewed following labs and imaging studies  LABORATORY DATA:  Recent Labs  Lab 05/20/23 0124 05/22/23 2024 05/23/23 0450 05/24/23 0654  WBC 11.3* 9.8 12.8* 11.4*  HGB 13.6 14.1 13.4 13.6  HCT 41.0 42.5 39.1 40.7  PLT 326 317 264 275  MCV 81.3 81.7 79.5* 80.1  MCH 27.0 27.1 27.2 26.8  MCHC 33.2 33.2 34.3 33.4  RDW 15.7* 15.4 15.0 15.2  LYMPHSABS  --   --  1.8  --   MONOABS  --   --  1.1*  --   EOSABS  --   --  0.0  --   BASOSABS  --   --  0.1  --     Recent Labs  Lab 05/20/23 0124 05/22/23 2024 05/22/23 2226 05/23/23 0450 05/24/23 0445 05/24/23 0654  NA 136 136  --  137  --  138  K 4.2 3.8  --  3.2*  --  3.6  CL 101 102  --  104  --  107  CO2 25 23  --  21*  --  20*  ANIONGAP 10 11  --  12  --  11  GLUCOSE 128* 114*  --  123*  --  109*  BUN 10 9  --  9  --  10  CREATININE 1.16 0.97  --  0.99  --  1.22  AST  --  30  --  33  --   --   ALT  --  27  --  27  --   --   ALKPHOS  --  60  --  57  --   --   BILITOT  --  1.2  --  1.7*  --   --   ALBUMIN  --  4.3  --  3.9  --   --   DDIMER  --   --   --   --  2.01*  --   INR  --   --   --  1.2  --   --   TSH  --   --  1.019  --   --   --   MG  --   --  1.9 1.9  --  2.0  CALCIUM 9.6 9.6  --  9.3  --  9.6    Lab Results  Component Value Date   CHOL 116 05/23/2023   HDL 65 05/23/2023   LDLCALC 39 05/23/2023   LDLDIRECT 163.3 05/26/2008   TRIG 59 05/23/2023   CHOLHDL 1.8 05/23/2023      Recent Labs  Lab 05/20/23 0124 05/22/23 2024 05/22/23 2226 05/23/23 0450 05/24/23 0445 05/24/23 0654  DDIMER  --   --   --   --  2.01*  --   INR  --   --   --  1.2  --   --   TSH  --   --  1.019  --   --   --   MG  --   --  1.9 1.9  --  2.0  CALCIUM 9.6 9.6  --  9.3  --  9.6    Recent Labs    05/22/23 2226  TSH 1.019    Recent Labs    05/22/23 2226  VITAMINB12 2,986*        RADIOLOGY STUDIES/RESULTS: DG CHEST PORT 1 VIEW  Result Date: 05/24/2023 CLINICAL DATA:   Tachycardia EXAM: PORTABLE CHEST 1 VIEW COMPARISON:  05/23/2023 FINDINGS: The heart size and mediastinal contours are within normal limits. Both lungs are clear. The visualized skeletal structures are unremarkable. IMPRESSION: No active disease. Electronically Signed   By: Helyn Numbers M.D.   On: 05/24/2023 22:10   MR CERVICAL SPINE WO CONTRAST  Result Date: 05/24/2023 CLINICAL DATA:  C6 vertebral body fracture on CT cervical spine after fall EXAM: MRI CERVICAL SPINE WITHOUT CONTRAST TECHNIQUE: Multiplanar, multisequence MR imaging of the cervical spine was performed. No intravenous contrast was administered. COMPARISON:  No prior MRI available, correlation is made with CT cervical spine 05/22/2023 FINDINGS: Evaluation is significantly limited by motion artifact. Alignment: Reversal of the normal cervical lordosis. Trace stepwise retrolisthesis of C3 on C4, C4 on C5, and C5 on C6. Vertebrae: The known fracture off the anterior inferior aspect of C1 is not well seen on these images due to motion. Cord: Best seen on the sagittal T2 sequence, no definite cord signal abnormality. No evidence of significant epidural collection. Posterior Fossa, vertebral arteries, paraspinal tissues: Prevertebral fluid, likely related to known fracture. Evaluation for ligamentous disruption at C6-C7 is particularly motion limited. Disc levels: The axial sequences are essentially nondiagnostic. 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 IMPRESSION: 1. Evaluation is significantly limited by motion artifact. The known fracture off the anterior inferior aspect of C1 is not well seen on these images due to motion. 2. 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 3. Prevertebral fluid, likely related to known fracture. 4.  Evaluation for ligamentous disruption at C6-C7 is particularly motion limited. Electronically Signed   By: Wiliam Ke M.D.   On: 05/24/2023 21:24   EEG adult  Result Date: 05/24/2023 Charlsie Quest, MD     05/24/2023  7:34 PM Patient Name: Eric [name] MRN: [id number] Epilepsy Attending: Charlsie Quest Referring Physician/Provider: Elmer Picker, NP Date: 05/24/2023 Duration: 24.51 mins Patient history: 36y M with ams getting eeg to evaluate for seizure Level of alertness: Awake AEDs during EEG study: Ativan Technical aspects: This EEG study was done with scalp electrodes positioned according to the 10-20 International system of electrode placement. Electrical activity was reviewed with band pass filter of 1-70Hz , sensitivity of 7 uV/mm, display speed of 93mm/sec with a 60Hz  notched filter applied as appropriate. EEG data were recorded continuously and digitally stored.  Video monitoring was available and reviewed as appropriate. Description: The posterior dominant rhythm consists of 8-9 Hz activity of moderate voltage (25-35 uV) seen predominantly in posterior head regions, asymmetric  ( right<left) and reactive to eye opening and eye closing. EEG showed continuous rhythmic 3 to 6 Hz theta-delta slowing in right temporal region. Intermittent generalized 3-6hz  theta-delta slowing was also noted. Hyperventilation and photic stimulation were not performed.   ABNORMALITY - Intermittent slow, generalized - Continuous slow, right temporal region IMPRESSION: This study is  suggestive of cortical dysfunction arising from right temporal region likely secondary to underlying structural abnormality. Additionally there is mild diffuse encephalopathy. No seizures or epileptiform  discharges were seen throughout the recording. Charlsie Quest   CT ANGIO HEAD NECK W WO CM  Result Date: 05/23/2023 CLINICAL DATA:  Stroke/TIA.  Determine embolic source. EXAM: CT ANGIOGRAPHY HEAD AND NECK WITH AND WITHOUT CONTRAST  TECHNIQUE: Multidetector CT imaging of the head and neck was performed using the standard protocol during bolus administration of intravenous contrast. Multiplanar CT image reconstructions and MIPs were obtained to evaluate the vascular anatomy. Carotid stenosis measurements (when applicable) are obtained utilizing NASCET criteria, using the distal internal carotid diameter as the denominator. RADIATION DOSE REDUCTION: This exam was performed according to the departmental dose-optimization program which includes automated exposure control, adjustment of the mA and/or kV according to patient size and/or use of iterative reconstruction technique. CONTRAST:  75mL OMNIPAQUE IOHEXOL 350 MG/ML SOLN COMPARISON:  CTA head neck 02/22/2023 FINDINGS: CT HEAD FINDINGS Brain: Advanced chronic white matter changes with multiple old infarcts, predominantly in the right hemisphere, but also the left parietal lobe and cerebellum. No acute hemorrhage. Mild volume loss. Vascular: There is atherosclerotic calcification of both internal carotid arteries at the skull base. Skull: Negative Sinuses/Orbits: Paranasal sinuses are clear. No mastoid effusion. Normal orbits. Other: None Review of the MIP images confirms the above findings CTA NECK FINDINGS Aortic arch: Standard branching. Imaged portion shows no evidence of aneurysm or dissection. No significant stenosis of the major arch vessel origins. Right carotid system: Mild atherosclerosis of the proximal ICA. The remainder of the ICA is diminutive but there is no focal stenosis. Left carotid system: Multifocal atherosclerotic calcification of the common carotid artery without stenosis. Mixed density plaque within the proximal ICA, causing less than 50% stenosis. The distal ICA is normal. Vertebral arteries: Right dominant. Both origins are normal. Both vertebral arteries are normal to the skull base. Skeleton: Negative Other neck: Negative. Upper chest: Mild biapical paraseptal  emphysema. Review of the MIP images confirms the above findings CTA HEAD FINDINGS Anterior circulation: --Intracranial internal carotid arteries: Diffuse mild narrowing of the right ICA at the skull base. Severe stenosis of the distal right cavernous segment due to atherosclerotic calcification. Calcific atherosclerosis of the left ICA at the skull base but no stenosis. --Anterior cerebral arteries (ACA): Absent right A1 segments. Otherwise normal. --Middle cerebral arteries (MCA): No proximal occlusion or flow-limiting stenosis. Posterior circulation: --Vertebral arteries: Normal --Inferior cerebellar arteries: Normal. --Basilar artery: Normal. --Superior cerebellar arteries: Normal. --Posterior cerebral arteries: Normal. Venous sinuses: Poor visualization due to contrast timing Anatomic variants: Absent right A1 segment, normal variant. Review of the MIP images confirms the above findings IMPRESSION: 1. No emergent large vessel occlusion. 2. 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 3. Bilateral carotid bifurcation atherosclerosis without focal stenosis. 4. Advanced chronic white matter changes with multiple old infarcts, predominantly in the right hemisphere, but also the left parietal lobe and cerebellum. Emphysema (ICD10-J43.9). Electronically Signed   By: Deatra Robinson M.D.   On: 05/23/2023 21:34   MR BRAIN WO CONTRAST  Result Date: 05/23/2023 CLINICAL DATA:  Acute neurologic deficit EXAM: MRI HEAD WITHOUT CONTRAST TECHNIQUE: Multiplanar, multiecho pulse sequences of the brain and surrounding structures were obtained without intravenous contrast. COMPARISON:  None Available. FINDINGS: Truncated and motion degraded examination. There is an area of abnormal diffusion restriction within the anterior right frontal lobe. 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 Normal flow voids at the skull  base. Normal orbits. Clear paranasal sinuses and mastoid air cells. IMPRESSION: 1. Truncated and motion  degraded examination. 2. Acute infarct within the anterior right frontal lobe. 3. Posterior right MCA territory encephalomalacia. 4. 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 Electronically Signed   By: Deatra Robinson M.D.   On: 05/23/2023 21:25   ECHOCARDIOGRAM COMPLETE  Result Date: 05/23/2023    ECHOCARDIOGRAM REPORT   Patient Name:   Eric [name] [facility] Date of Exam: 05/23/2023 Medical Rec #:  [id number]         Height:       68.0 in Accession #:    [id number]        Weight:       134.5 lb Date of Birth:  1940-11-25         BSA:          1.727 m Patient Age:    82 years          BP:           128/74 mmHg Patient Gender: M                 HR:           103 bpm. Exam Location:  Inpatient Procedure: 2D Echo, Cardiac Doppler and Color Doppler Indications:    Stroke  History:        Patient has prior history of Echocardiogram examinations, most                 recent 02/23/2023. Stroke and COPD; Risk Factors:Hypertension.  Sonographer:    Darlys Gales Referring Phys: [id number] Angie Fava  Sonographer Comments: Technically difficult study due to poor echo windows. Image acquisition challenging due to COPD and Image acquisition challenging due to uncooperative patient. IMPRESSIONS  1. Technically difficult study with very limited visualization of cardiac structures.  2. Left ventricular systolic function appears normal with an EF of 55% to 60%.  3. The right ventricle appears normal in structure and function.  4. The cardiac valves are not well visualized but there are no obvious abnormalities. FINDINGS  Left Ventricle: Left ventricular ejection fraction, by estimation, is 60 to 65%. The left ventricle has normal function. The left ventricle has no regional wall motion abnormalities. The left ventricular internal cavity size was normal in size. There is  no left ventricular hypertrophy. Left ventricular diastolic function  could not be evaluated. Right Ventricle: The right ventricular size is normal. No increase in right ventricular wall thickness. Right ventricular systolic function is normal. Left Atrium: Left atrial size was normal in size. Right Atrium: Right atrial size was normal in size. Pericardium: There is no evidence of pericardial effusion. Mitral Valve: The mitral valve is grossly normal. No evidence of mitral valve regurgitation. No evidence of mitral valve stenosis. Tricuspid Valve: The tricuspid valve is not well visualized. Tricuspid valve regurgitation is not demonstrated. No evidence of tricuspid stenosis. Aortic Valve: The aortic valve is grossly normal. Aortic valve regurgitation is not visualized. No aortic stenosis is present. Pulmonic Valve: The pulmonic valve was not assessed. Pulmonic valve regurgitation is not visualized. No evidence of pulmonic stenosis. Aorta: The aortic root was not well visualized. Venous: The inferior vena cava was not well visualized. IAS/Shunts: No atrial level shunt detected by color flow Doppler.  LEFT VENTRICLE PLAX 2D LVIDd:         3.80 cm LVIDs:         3.30 cm LV PW:         0.90 cm LV IVS:        0.50 cm LVOT diam:  1.80 cm LVOT Area:     2.54 cm  MITRAL VALVE               TRICUSPID VALVE MV Area (PHT): 4.39 cm    TR Peak grad:   29.4 mmHg MV Decel Time: 173 msec    TR Vmax:        271.00 cm/s MV E velocity: 65.10 cm/s                            SHUNTS                            Systemic Diam: 1.80 cm Aditya Sabharwal Electronically signed by Dorthula Nettles Signature Date/Time: 05/23/2023/2:14:30 PM    Final      LOS: 3 days   Signature  -    Susa Raring M.D on 05/25/2023 at 10:53 AM   -  To page go to [email or web address]

## 2023-05-25 NOTE — Progress Notes (Signed)
Nurse attempted to insert Coude cath per MS nurse. Nurse was unable to get urine return. Nurse was able to insert coude cath without resistance, blood at urethra exit noted. Wife at bedside, Pt nurse to call urology for further assistance.

## 2023-05-26 ENCOUNTER — Inpatient Hospital Stay (HOSPITAL_COMMUNITY): Payer: Medicare Other

## 2023-05-26 DIAGNOSIS — I639 Cerebral infarction, unspecified: Secondary | ICD-10-CM | POA: Diagnosis not present

## 2023-05-26 LAB — CBC WITH DIFFERENTIAL/PLATELET
Abs Immature Granulocytes: 0.08 10*3/uL — ABNORMAL HIGH (ref 0.00–0.07)
Basophils Absolute: 0 10*3/uL (ref 0.0–0.1)
Basophils Relative: 0 %
Eosinophils Absolute: 0 10*3/uL (ref 0.0–0.5)
Eosinophils Relative: 0 %
HCT: 43.9 % (ref 39.0–52.0)
Hemoglobin: 14.6 g/dL (ref 13.0–17.0)
Immature Granulocytes: 0 %
Lymphocytes Relative: 5 %
Lymphs Abs: 0.9 10*3/uL (ref 0.7–4.0)
MCH: 26.9 pg (ref 26.0–34.0)
MCHC: 33.3 g/dL (ref 30.0–36.0)
MCV: 80.8 fL (ref 80.0–100.0)
Monocytes Absolute: 1.1 10*3/uL — ABNORMAL HIGH (ref 0.1–1.0)
Monocytes Relative: 6 %
Neutro Abs: 16.6 10*3/uL — ABNORMAL HIGH (ref 1.7–7.7)
Neutrophils Relative %: 89 %
Platelets: 271 10*3/uL (ref 150–400)
RBC: 5.43 MIL/uL (ref 4.22–5.81)
RDW: 14.9 % (ref 11.5–15.5)
WBC: 18.7 10*3/uL — ABNORMAL HIGH (ref 4.0–10.5)
nRBC: 0 % (ref 0.0–0.2)

## 2023-05-26 LAB — URINALYSIS, W/ REFLEX TO CULTURE (INFECTION SUSPECTED)
Bilirubin Urine: NEGATIVE
Glucose, UA: 50 mg/dL — AB
Ketones, ur: 20 mg/dL — AB
Nitrite: NEGATIVE
Protein, ur: 100 mg/dL — AB
Specific Gravity, Urine: 1.014 (ref 1.005–1.030)
pH: 5 (ref 5.0–8.0)

## 2023-05-26 LAB — COMPREHENSIVE METABOLIC PANEL
ALT: 36 U/L (ref 0–44)
AST: 53 U/L — ABNORMAL HIGH (ref 15–41)
Albumin: 3.7 g/dL (ref 3.5–5.0)
Alkaline Phosphatase: 54 U/L (ref 38–126)
Anion gap: 17 — ABNORMAL HIGH (ref 5–15)
BUN: 24 mg/dL — ABNORMAL HIGH (ref 8–23)
CO2: 17 mmol/L — ABNORMAL LOW (ref 22–32)
Calcium: 9.8 mg/dL (ref 8.9–10.3)
Chloride: 104 mmol/L (ref 98–111)
Creatinine, Ser: 1.88 mg/dL — ABNORMAL HIGH (ref 0.61–1.24)
GFR, Estimated: 35 mL/min — ABNORMAL LOW (ref >60)
Glucose, Bld: 94 mg/dL (ref 70–99)
Potassium: 4.4 mmol/L (ref 3.5–5.1)
Sodium: 138 mmol/L (ref 135–145)
Total Bilirubin: 1.3 mg/dL — ABNORMAL HIGH (ref 0.3–1.2)
Total Protein: 7.7 g/dL (ref 6.5–8.1)

## 2023-05-26 LAB — HEMOGLOBIN A1C
Hgb A1c MFr Bld: 6.3 % — ABNORMAL HIGH (ref 4.8–5.6)
Mean Plasma Glucose: 134.11 mg/dL

## 2023-05-26 LAB — C-REACTIVE PROTEIN: CRP: 4.4 mg/dL — ABNORMAL HIGH (ref <1.0)

## 2023-05-26 LAB — PROCALCITONIN: Procalcitonin: 0.36 ng/mL

## 2023-05-26 LAB — HEPARIN LEVEL (UNFRACTIONATED): Heparin Unfractionated: 0.51 IU/mL (ref 0.30–0.70)

## 2023-05-26 MED ORDER — LACTATED RINGERS IV SOLN: INTRAVENOUS | Status: AC

## 2023-05-26 MED ORDER — LACTATED RINGERS IV SOLN: INTRAVENOUS | Status: DC

## 2023-05-26 MED ORDER — MORPHINE SULFATE (PF) 2 MG/ML IV SOLN
2.0000 mg | Freq: Three times a day (TID) | INTRAVENOUS | Status: DC | PRN
  Administered 2023-05-26 – 2023-06-02 (×16): 2 mg via INTRAVENOUS
  Filled 2023-05-26 (×17): qty 1

## 2023-05-26 MED ORDER — SODIUM CHLORIDE 0.9 % IV SOLN
3.0000 g | Freq: Two times a day (BID) | INTRAVENOUS | Status: AC
  Administered 2023-05-26 – 2023-05-31 (×11): 3 g via INTRAVENOUS
  Filled 2023-05-26 (×11): qty 8

## 2023-05-26 MED ORDER — CHLORHEXIDINE GLUCONATE CLOTH 2 % EX PADS
6.0000 | MEDICATED_PAD | Freq: Every day | CUTANEOUS | Status: DC
  Administered 2023-05-26 – 2023-06-14 (×20): 6 via TOPICAL

## 2023-05-26 MED ORDER — BISACODYL 10 MG RE SUPP
10.0000 mg | Freq: Once | RECTAL | Status: AC
  Administered 2023-05-26: 10 mg via RECTAL
  Filled 2023-05-26: qty 1

## 2023-05-26 MED ORDER — HYDRALAZINE HCL 20 MG/ML IJ SOLN
10.0000 mg | Freq: Four times a day (QID) | INTRAMUSCULAR | Status: DC | PRN
  Administered 2023-05-27 – 2023-06-01 (×5): 10 mg via INTRAVENOUS
  Filled 2023-05-26 (×7): qty 1

## 2023-05-26 MED ORDER — IOHEXOL 350 MG/ML SOLN
75.0000 mL | Freq: Once | INTRAVENOUS | Status: AC | PRN
  Administered 2023-05-26: 75 mL via INTRAVENOUS

## 2023-05-26 MED ORDER — HEPARIN (PORCINE) 25000 UT/250ML-% IV SOLN
750.0000 [IU]/h | INTRAVENOUS | Status: DC
  Administered 2023-05-26: 750 [IU]/h via INTRAVENOUS
  Administered 2023-05-28: 700 [IU]/h via INTRAVENOUS
  Filled 2023-05-26 (×2): qty 250

## 2023-05-26 NOTE — Plan of Care (Signed)
  Problem: Education: Goal: Knowledge of disease or condition will improve Outcome: Progressing Goal: Knowledge of secondary prevention will improve (MUST DOCUMENT ALL) Outcome: Progressing Goal: Knowledge of patient specific risk factors will improve Loraine Leriche N/A or DELETE if not current risk factor) Outcome: Progressing   Problem: Ischemic Stroke/TIA Tissue Perfusion: Goal: Complications of ischemic stroke/TIA will be minimized Outcome: Progressing   Problem: Coping: Goal: Will verbalize positive feelings about self 05/26/2023 0119 by Nicole Cella, RN Outcome: Progressing 05/26/2023 0118 by Nicole Cella, RN Outcome: Progressing Goal: Will identify appropriate support needs 05/26/2023 0119 by Nicole Cella, RN Outcome: Progressing 05/26/2023 0118 by Nicole Cella, RN Outcome: Progressing   Problem: Health Behavior/Discharge Planning: Goal: Ability to manage health-related needs will improve Outcome: Progressing Goal: Goals will be collaboratively established with patient/family Outcome: Progressing   Problem: Self-Care: Goal: Ability to participate in self-care as condition permits will improve Outcome: Progressing Goal: Verbalization of feelings and concerns over difficulty with self-care will improve Outcome: Progressing Goal: Ability to communicate needs accurately will improve Outcome: Progressing   Problem: Nutrition: Goal: Risk of aspiration will decrease Outcome: Progressing Goal: Dietary intake will improve Outcome: Progressing

## 2023-05-26 NOTE — Progress Notes (Signed)
Pharmacy Antibiotic Note  Eric [name] is a 82 y.o. male admitted on 05/22/2023 with aspiration pneumonia.  Pharmacy has been consulted for Unasyn dosing.  9/23 CT Chest/Abd: Patchy airspace consolidation w/in the dependent portion of the LLL, concerning for PNA. Small focus of ground-glass opacity within the LUL, which may represent an additional focus of infection.  Plan: Unasyn 3 grams IV every 12 hours Monitor clinical progress, cultures/sensitivities, renal function, abx plan    Height: 5\' 8"  (172.7 cm) Weight: 59.7 kg (131 lb 9.8 oz) IBW/kg (Calculated) : 68.4  Temp (24hrs), Avg:98.1 F (36.7 C), Min:97.7 F (36.5 C), Max:98.5 F (36.9 C)  Recent Labs  Lab 05/22/23 2024 05/23/23 0450 05/24/23 0654 05/25/23 1557 05/26/23 0931  WBC 9.8 12.8* 11.4* 13.6* 18.7*  CREATININE 0.97 0.99 1.22 1.46* 1.88*    Estimated Creatinine Clearance: 25.6 mL/min (A) (by C-G formula based on SCr of 1.88 mg/dL (H)).    Allergies  Allergen Reactions   Hctz [Hydrochlorothiazide] Other (See Comments)    "Peeing too much"    Antimicrobials this admission: 9/23 Unasyn >>   Dose adjustments this admission:   Microbiology results: 9/23 BCx: sent 9/23 UCx:    Thank you for allowing pharmacy to be a part of this patient's care.   Signe Colt, PharmD 05/26/2023 1:40 PM  **Pharmacist phone directory can be found on [email or web address] listed under [facility]**

## 2023-05-26 NOTE — Progress Notes (Signed)
ANTICOAGULATION CONSULT NOTE - Initial Consult  Pharmacy Consult for heparin Indication: stroke  Allergies  Allergen Reactions   Hctz [Hydrochlorothiazide] Other (See Comments)    "Peeing too much"    Patient Measurements: Height: 5\' 8"  (172.7 cm) Weight: 59.7 kg (131 lb 9.8 oz) IBW/kg (Calculated) : 68.4 Heparin Dosing Weight: 59.7 kg  Vital Signs: Temp: 99.2 F (37.3 C) (09/23 2000) Temp Source: Axillary (09/23 2000) BP: 153/83 (09/23 2000)  Labs: Recent Labs    05/24/23 0654 05/25/23 1557 05/26/23 0931 05/26/23 2152  HGB 13.6 14.1 14.6  --   HCT 40.7 40.9 43.9  --   PLT 275 295 271  --   HEPARINUNFRC  --   --   --  0.51  CREATININE 1.22 1.46* 1.88*  --     Estimated Creatinine Clearance: 25.6 mL/min (A) (by C-G formula based on SCr of 1.88 mg/dL (H)).   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  male with history of CVA, not on anticoagulation prior. On aspirin PTA. Acute CVA 9/20, elevated D-dimer. Patient with worsening status tis morning. 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 Pharmacy consulted to start heparin (No bolus - stroke protocol).   Abbreviated recent image history: 9/19>> CT head: 0.9 cm focus of intraparenchymal hemorrhage right frontal lobe.  Acute to subacute cortical infarct left parietal lobe. 9/20>> CT head: Stable 1 cm anterior right middle frontal gyrus hemorrhagic contusion versus trace subarachnoid blood. 9/20>> MRI brain: Acute infarct anterior right frontal lobe. 9/23 >> CT chest/abd: thrombus in the mid abdominal aorta; images compatible with right renal infarction   HL 0.51 - slightly above goal  Goal  of Therapy:  Heparin level 0.3-0.5 units/ml Monitor platelets by anticoagulation protocol: Yes   Plan:  No Bolus (stroke protocol) Reduce heparin infusion to 700 units/hr Check anti-Xa level in 8 hours and daily while on heparin Continue to monitor H&H and platelets  Calton Dach, PharmD, BCCCP Clinical Pharmacist

## 2023-05-26 NOTE — Progress Notes (Signed)
PT Cancellation Note  Patient Details Name: Eric [name] MRN: [id number] DOB: 1941/06/08   Cancelled Treatment:    Reason Eval/Treat Not Completed: Medical issues which prohibited therapy. Per RN, pt is becoming septic and not appropriate for eval this am. Will follow as appropriate.   Hilton Cork, PT, DPT Secure Chat Preferred  Rehab Office [phone]   Arturo Morton Brion Aliment 05/26/2023, 9:09 AM

## 2023-05-26 NOTE — Progress Notes (Signed)
Speech Language Pathology Treatment: Dysphagia  Patient Details Name: Eric [name] MRN: [id number] DOB: 10/31/1940 Today's Date: 05/26/2023 Time: 1308-6578 SLP Time Calculation (min) (ACUTE ONLY): 16 min  Assessment / Plan / Recommendation Clinical Impression  Pt seen with family member at bedside who states he doesn't seem to like the broth/liquids. Pt sat in upright position using reverse trendelenburg. He consumed thin with straw with extra time to extract liquid initially but no s/s aspiration throughout session. Trials applesauce consumed without difficulty and trials of graham cracker to determine if diet could be upgraded. He consumed 3 trials with mildly prolonged mastication but overall functional and minimal lingual residue. SLP upgraded pt's diet to Dys 2, continue thin liquids, use tongue to check for potential pocketing and use liquid wash. Sit in upright position using reverse trendelenburg positioning. ST will continue.    HPI HPI: 82 y.o. male presents to [facility] hospital on 05/23/2023 as a transfer from [facility] ED after a fall. CT head suggestive of acute to subacute L parietal infarct, as well as IPH in R frontal lobe. Pt also found to have C6 vertebral body fx. PMH includes CVA, HTN. Pt is mild-moderately dysarthric      SLP Plan  Continue with current plan of care      Recommendations for follow up therapy are one component of a multi-disciplinary discharge planning process, led by the attending physician.  Recommendations may be updated based on patient status, additional functional criteria and insurance authorization.    Recommendations  Diet recommendations: Dysphagia 2 (fine chop);Thin liquid Liquids provided via: Straw;Cup Medication Administration: Whole meds with puree Supervision: Staff to assist with self feeding Compensations: Slow rate;Small sips/bites;Clear throat after each swallow Postural Changes and/or Swallow Maneuvers: Seated upright 90 degrees                   Oral care BID   Frequent or constant Supervision/Assistance Dysphagia, unspecified (R13.10)     Continue with current plan of care     Eric [name]  05/26/2023, 10:10 AM

## 2023-05-26 NOTE — Care Management Important Message (Signed)
Important Message  Patient Details  Name: Eric [name] MRN: [id number] Date of Birth: 25-Jan-1941   Important Message Given:  Yes - Medicare IM     Dorena Bodo 05/26/2023, 3:54 PM

## 2023-05-26 NOTE — Progress Notes (Signed)
OT Cancellation Note  Patient Details Name: Eric [name] MRN: [id number] DOB: [birth year]   Cancelled Treatment:    Reason Eval/Treat Not Completed: Patient not medically ready (Pt now septic, per RN hold therapies today. OT to follow when appropriate.)  Donia Pounds 05/26/2023, 1:48 PM

## 2023-05-26 NOTE — Consult Note (Addendum)
[facility] [place] NOTE   MRN : [id number]  Reason for Consult: Aortic thrombus Referring Physician: Dr. Thedore Mins  History of Present Illness: 82 y/o male admitted to [facility] after he had a mechanical fall at home.  He was found to have an acute CVA and sustained C6 vertebral body fracture. During his work up the CTA of the chest/abdomin and pelvis revealed Nonocclusive, pedunculated, partially adherent thrombus in the mid abdominal aorta measuring approximately 2.4 x 1.1 cm in size which occludes the ostium of the right renal artery.  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   The patient does not move much nor talk much, but is alert and moves all 4 extremities.  His family is in the room and answered my questions.   The son was present after his fall and helped him get dressed to bring him to the hospital.  He has history of 2 CVA's and multiple TIA.  He is not DM or does not have Afib.     He has been started on Heparin dose per pharmacy without a bolus, ASA and Statin.            Current Facility-Administered Medications  Medication Dose Route Frequency Provider Last Rate Last Admin   acetaminophen (TYLENOL) tablet 650 mg  650 mg Oral Q6H PRN Howerter, Justin B, DO   650 mg at 05/26/23 8469   Or   acetaminophen (TYLENOL) suppository 650 mg  650 mg Rectal Q6H PRN Howerter, Justin B, DO       bisacodyl (DULCOLAX) suppository 10 mg  10 mg Rectal Once Leroy Sea, MD       Chlorhexidine Gluconate Cloth 2 % PADS 6 each  6 each Topical Daily Leroy Sea, MD       finasteride (PROSCAR) tablet 5 mg  5 mg Oral Daily Maretta Bees, MD   5 mg at 05/26/23 0857   hydrALAZINE (APRESOLINE) injection 10 mg  10 mg Intravenous Q6H PRN Leroy Sea, MD       labetalol (NORMODYNE) injection 5 mg  5 mg Intravenous Q2H PRN Dow Adolph N, DO   5 mg at 05/25/23 6295   lactated ringers infusion   Intravenous Continuous Leroy Sea, MD 75 mL/hr at 05/26/23 0959 New Bag at 05/26/23 0959   lidocaine (XYLOCAINE) 2 % jelly 1 Application  1 Application Urethral Once Leroy Sea, MD       melatonin tablet 3 mg  3 mg Oral QHS PRN Howerter, Justin B, DO   3 mg at 05/25/23 2031   morphine (PF) 2 MG/ML injection 2 mg  2 mg Intravenous Q8H PRN Leroy Sea, MD       ondansetron [facility]) injection 4 mg  4 mg Intravenous Q6H PRN Howerter, Justin B, DO       polyethylene glycol (MIRALAX / GLYCOLAX) packet 17 g  17 g Oral Daily PRN Hall, Carole N, DO       rosuvastatin (CRESTOR) tablet 40 mg  40 mg Oral Daily Maretta Bees, MD   40 mg at 05/26/23 0857   tamsulosin (FLOMAX) capsule 0.4 mg  0.4 mg Oral Daily Ray Church III, MD   0.4 mg at 05/26/23 0857    Pt meds include: Statin :Yes Betablocker: No ASA: Yes Other anticoagulants/antiplatelets: Heparin started 1400 05/26/23  Past Medical History:  Diagnosis  Date   Benign prostatic hypertrophy    COPD (chronic obstructive pulmonary disease) (HCC)    in past   Difficulty reading    and writing   ED (erectile dysfunction)    Gynecomastia, 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   Hypertension    Stroke (HCC) 06/2014    Past Surgical History:  Procedure Laterality Date   bilateral breast removal     COLONOSCOPY  04/24/05    Social History Social History   Tobacco Use   Smoking status: Former    Current packs/day: 0.00    Types: Cigarettes    Quit date: 09/22/1999    Years since quitting: 23.6   Smokeless tobacco: Never  Substance Use Topics   Alcohol use: No    Alcohol/week: 0.0 standard drinks of alcohol   Drug use: No    Family History Family History  Problem Relation Age of Onset   Arthritis Mother    Diabetes Father    Diabetes Other    Hypertension Other     Allergies  Allergen Reactions   Hctz [Hydrochlorothiazide] Other (See  Comments)    "Peeing too much"     REVIEW OF SYSTEMS  General: [ ]  Weight loss, [ ]  Fever, [ ]  chills Neurologic: [ ]  Dizziness, [ ]  Blackouts, [ ]  Seizure [ ]  Stroke, [ ]  "Mini stroke", [ ]  Slurred speech, [ ]  Temporary blindness; [ x] weakness in arms or legs, [ ]  Hoarseness [x ] Dysphagia Cardiac: [ ]  Chest pain/pressure, [ ]  Shortness of breath at rest [ ]  Shortness of breath with exertion, [ ]  Atrial fibrillation or irregular heartbeat  Vascular: [ ]  Pain in legs with walking, [ ]  Pain in legs at rest, [ ]  Pain in legs at night,  [ ]  Non-healing ulcer, [ ]  Blood clot in vein/DVT,   Pulmonary: [ ]  Home oxygen, [ ]  Productive cough, [ ]  Coughing up blood, [ ]  Asthma,  [ ]  Wheezing [ x] COPD Musculoskeletal:  [ ]  Arthritis, [ ]  Low back pain, [ ]  Joint pain Hematologic: [ ]  Easy Bruising, [ ]  Anemia; [ ]  Hepatitis Gastrointestinal: [ ]  Blood in stool, [ ]  Gastroesophageal Reflux/heartburn, Urinary: [ ]  chronic Kidney disease, [ ]  on HD - [ ]  MWF or [ ]  TTHS, [ ]  Burning with urination, [ ]  Difficulty urinating Skin: [ ]  Rashes, [ ]  Wounds Psychological: [ ]  Anxiety, [ ]  Depression  Physical Examination Vitals:   05/26/23 0200 05/26/23 0400 05/26/23 0435 05/26/23 0500  BP:  (!) 165/90 (!) 164/89   Pulse: (!) 109 (!) 106 (!) 107   Resp: 17 (!) 24 20   Temp:      TempSrc:      SpO2: 97% 94% 94%   Weight:    59.7 kg  Height:       Body mass index is 20.01 kg/m.  General:   NAD stairs left/ does not turn his head  HENT: WNL Eyes: Pupils equal Pulmonary: normal non-labored breathing , without Rales, rhonchi,  wheezing Cardiac: RRR, without  Murmurs, rubs or gallops; No carotid bruits Abdomen: soft, NT, no masses Skin: no rashes, ulcers noted;  no Gangrene , no cellulitis; no open wounds;   Vascular Exam/Pulses:Palpable DP/popliteal, B radial pulses   Musculoskeletal: no muscle wasting or atrophy; no edema  Neurologic: A&O X 3; Appropriate Affect ;  SENSATION:  normal; MOTOR FUNCTION: spontaneously moves all 4 extremities Speech low volume yes/no answers   Significant Diagnostic Studies: CBC  Lab Results  Component Value Date   WBC 18.7 (H) 05/26/2023   HGB 14.6 05/26/2023   HCT 43.9 05/26/2023   MCV 80.8 05/26/2023   PLT 271 05/26/2023    BMET    Component Value Date/Time   NA 138 05/26/2023 0931   K 4.4 05/26/2023 0931   CL 104 05/26/2023 0931   CO2 17 (L) 05/26/2023 0931   GLUCOSE 94 05/26/2023 0931   BUN 24 (H) 05/26/2023 0931   CREATININE 1.88 (H) 05/26/2023 0931   CALCIUM 9.8 05/26/2023 0931   GFRNONAA 35 (L) 05/26/2023 0931   GFRAA >60 11/08/2015 2151   Estimated Creatinine Clearance: 25.6 mL/min (A) (by C-G formula based on SCr of 1.88 mg/dL (H)).  COAG Lab Results  Component Value Date   INR 1.2 05/23/2023   INR 1.1 02/22/2023   INR 1.12 07/20/2013     Non-Invasive Vascular Imaging:  CTA 05/26/23 IMPRESSION: 1. 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 Assessment by vascular surgery or interventional radiology is recommended. 2. Extensive wedge-shaped areas of poor enhancement of the right kidney, compatible with right renal infarction. 3. Patchy airspace consolidation within the dependent portion of the left lower lobe, concerning for pneumonia. Small focus of ground-glass opacity within the left upper lobe, which may represent an additional focus of infection. 4. Thick-walled urinary bladder which is partially decompressed by Foley catheter. Correlate with urinalysis. 5. Patchy increased sclerosis within the T12 vertebral body, nonspecific. Consider further evaluation with nonemergent MRI of the thoracic spine.    CTA neck 05/23/23 Right carotid system: Mild atherosclerosis of the proximal ICA. The remainder of the ICA is diminutive but there is no focal stenosis.    Left carotid system: Multifocal atherosclerotic calcification of the common carotid artery without stenosis. Mixed density plaque within the proximal ICA, causing less than 50% stenosis. The distal ICA is normal.   Vertebral arteries: Right dominant. Both origins are normal. Both vertebral arteries are normal to the skull base.  ASSESSMENT/PLAN:  Fall at home sustained C6 vertebral body fracture and  acute CVA.  He has had multiple CVA/TIA in the past.  He has a h/o CVA in June 2024 and has been declining since.    A CTA was ordered and demonstrated Nonocclusive, pedunculated, partially adherent thrombus in the mid abdominal aorta measuring approximately 2.4 x 1.1 cm in size which occludes the ostium of the right renal artery.   We recommend medical management with full dose Heparin which has been started.  Plan will be for repeat CTA in 48-72 hours.  .     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  No carotid intervention is indicated.    Mosetta Pigeon 05/26/2023 1:26 PM  I have seen and evaluated the patient. I agree with the PA note as documented above.  82 year old male admitted with acute stroke with MRI brain showing acute infarct in the right anterior lobe.  Family states he had some dysphagia.  Also had a mechanical fall last Thursday.  Has been complaining of back pain at least the last 4 or 5 days in setting of chronic back pain.  Ultimately underwent sepsis workup today and got a CTA showing nonocclusive thrombus in the abdominal aorta at the level of the renals with what looks like an occluded right renal artery.  There  is really no contrast enhancing the right kidney except for a very small section of the superior pole.  This thrombus does have an acute appearance.  He has no signs of distal malperfusion as he has palpable femoral pulses and brisk pedal signals in the dorsalis pedis.  I  have recommended anticoagulation starting heparin.  Will repeat a CTA in several days pending his renal function.  650 mL UOP has been recorded so far today.  Cr has increased to 1.88.  Left renal artery widely patent.  I do not think there is really any benefit to attempting percutaneous thrombectomy of his right renal given duration of symptoms for at least for 5 days and any ischemic insult would be nonreversible at this point.  There would also be high risk for distalization potentially compounding his problem.  Cephus Shelling, MD [facility] [place] Office: [phone]

## 2023-05-26 NOTE — Plan of Care (Signed)
Problem: Education: Goal: Knowledge of disease or condition will improve Outcome: Progressing Goal: Knowledge of secondary prevention will improve (MUST DOCUMENT ALL) Outcome: Progressing Goal: Knowledge of patient specific risk factors will improve Loraine Leriche N/A or DELETE if not current risk factor) Outcome: Progressing   Problem: Ischemic Stroke/TIA Tissue Perfusion: Goal: Complications of ischemic stroke/TIA will be minimized Outcome: Progressing   Problem: Coping: Goal: Will verbalize positive feelings about self Outcome: Progressing Goal: Will identify appropriate support needs Outcome: Progressing

## 2023-05-26 NOTE — Progress Notes (Signed)
PT HR was sustaining in the 150s. Went to room to check on patient. Pt covered in sweat. BP elevated at 202/122. Trembling. Attempted to get EKG but could not as he was shaking too bad. PRN Labatolol given for BP as well as PRN morphine for pain. VS after interventions 98.1 BP 159/94 HR 105 RR 24. On call provider notified. Pt now resting.

## 2023-05-26 NOTE — Progress Notes (Signed)
ANTICOAGULATION CONSULT NOTE - Initial Consult  Pharmacy Consult for heparin Indication: stroke  Allergies  Allergen Reactions   Hctz [Hydrochlorothiazide] Other (See Comments)    "Peeing too much"    Patient Measurements: Height: 5\' 8"  (172.7 cm) Weight: 59.7 kg (131 lb 9.8 oz) IBW/kg (Calculated) : 68.4 Heparin Dosing Weight: 59.7 kg  Vital Signs: BP: 164/89 (09/23 0435) Pulse Rate: 107 (09/23 0435)  Labs: Recent Labs    05/24/23 0654 05/25/23 1557 05/26/23 0931  HGB 13.6 14.1 14.6  HCT 40.7 40.9 43.9  PLT 275 295 271  CREATININE 1.22 1.46* 1.88*    Estimated Creatinine Clearance: 25.6 mL/min (A) (by C-G formula based on SCr of 1.88 mg/dL (H)).   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 For arthritis in back   unk   amLODipine-benazepril (LOTREL) 5-20 MG capsule TAKE 1 CAPSULE BY MOUTH EVERY DAY (Patient taking differently: Take 1 capsule by mouth at bedtime.) 90 capsule 3 05/22/2023   BAYER LOW DOSE 81 MG tablet Take 81 mg by mouth in the morning. Swallow whole.   05/22/2023 at 0800   Cyanocobalamin (VITAMIN B-12) 1000 MCG SUBL Place 1 tablet (1,000 mcg total) under the tongue daily. (Patient taking differently: Place 1 tablet under the tongue at bedtime.) 100 tablet 3 05/21/2023 at pm   ferrous sulfate 325 (65 FE) MG tablet Take 1 tablet (325 mg total) by mouth 2 (two) times daily with a meal. (Patient taking differently: Take 325 mg by mouth at bedtime.) 100 tablet 2 05/22/2023   finasteride (PROSCAR) 5 MG  tablet Take 1 tablet (5 mg total) by mouth daily. 90 tablet 3 05/22/2023   oxybutynin (DITROPAN-XL) 5 MG 24 hr tablet TAKE 1 TABLET (5 MG TOTAL) BY MOUTH AT BEDTIME. FOR FREQUENT URINATION 90 tablet 2 05/22/2023   potassium chloride SA (KLOR-CON M) 20 MEQ tablet Take 1 tablet (20 mEq total) by mouth daily. 30 tablet 5 05/22/2023   rosuvastatin (CRESTOR) 20 MG tablet Take 1 tablet (20 mg total) by mouth daily. 30 tablet 4 05/22/2023   traMADol (ULTRAM) 50 MG tablet TAKE 1 TABLET 3 TIMES A DAY AS NEEDED SEVERE PAIN (Patient taking differently: Take 50 mg by mouth 3 (three) times daily.) 90 tablet 3 05/22/2023   aspirin EC 81 MG tablet Take 1 tablet (81 mg total) by mouth daily. Swallow whole. (Patient not taking: Reported on 05/22/2023) 30 tablet 12 Not Taking   pantoprazole (PROTONIX) 40 MG tablet Take 1 tablet (40 mg total) by mouth daily. 30 tablet 3 unk    Assessment: 82 y.o.  male with history of CVA, not on anticoagulation prior. On aspirin PTA. Acute CVA 9/20, elevated D-dimer. Patient with worsening status tis morning. 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 Pharmacy consulted to start heparin (No bolus - stroke protocol).   Abbreviated recent image history: 9/19>> CT head: 0.9 cm focus of intraparenchymal hemorrhage right frontal lobe.  Acute to subacute cortical infarct left parietal  lobe. 9/20>> CT head: Stable 1 cm anterior right middle frontal gyrus hemorrhagic contusion versus trace subarachnoid blood. 9/20>> MRI brain: Acute infarct anterior right frontal lobe. 9/23 >> CT chest/abd: thrombus in the mid abdominal aorta; images compatible with right renal infarction   Goal of Therapy:  Heparin level 0.3-0.5 units/ml Monitor platelets by anticoagulation protocol: Yes   Plan:  No Bolus (stroke protocol) Start heparin infusion at 750 units/hr Check anti-Xa level in 8 hours and daily while on heparin Continue to monitor H&H and platelets    Thank you  for allowing pharmacy to be a part of this patient's care.   Signe Colt, PharmD 05/26/2023 1:21 PM  **Pharmacist phone directory can be found on [email or web address] listed under [facility]**

## 2023-05-26 NOTE — Progress Notes (Addendum)
PROGRESS NOTE        PATIENT DETAILS Name: Eric [name] Age: 82 y.o. Sex: male Date of Birth: 09-Mar-1941 Admit Date: 05/22/2023 Admitting Physician Angie Fava, DO OAC:[id number], Georgina Quint, MD  Brief Summary: Patient is a 82 y.o.  male with history of CVA, HTN-who sustained a mechanical fall at home-upon further evaluation-was found to have acute CVA and C6 vertebral body fracture.  Patient was initially evaluated at [facility] and transferred to [facility] service at [facility] for further evaluation  Significant events: 9/19>> admit to [facility]  Significant studies: 6/23>> A1c: 5.8 9/19>> CT head: 0.9 cm focus of intraparenchymal hemorrhage right frontal lobe.  Acute to subacute cortical infarct left parietal lobe. 9/19 >>CT C-spine: Acute nondisplaced fracture through the anterior inferior corner of C6 vertebral body. 9/20>> CT head: Stable 1 cm anterior right middle frontal gyrus hemorrhagic contusion versus trace subarachnoid blood. 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 9/20>> MRI brain: Acute infarct anterior right frontal lobe. 9/20>> echo: EF 55-60% 9/20>> LDL: 112 05/25/2023.  Foley catheter placed by urology  Significant microbiology data: None  Procedures: None  Consults: Neurology, urology  Subjective:  Patient in bed, no headache or chest pain, has some abdominal discomfort wants to go to the bathroom, no neck pain.  Objective: Vitals: Blood pressure (!) 164/89, pulse (!) 107, temperature 98.5 F (36.9 C), temperature source Oral, resp. rate 20, height 5\' 8"  (1.727 m), weight 59.7 kg, SpO2 94%.   Exam:  Frail elderly AA male, Awake Alert, No new F.N deficits, Normal affect [place].AT,PERRAL Supple Neck, No JVD,   Symmetrical Chest wall movement, Good air movement bilaterally, CTAB RRR,No Gallops, Rubs  or new Murmurs,  +ve B.Sounds, Abd Soft,   No Cyanosis, Clubbing or edema    Assessment/Plan:  Addendum 8.54 am - profuse sweating - tachycardia, unable to tell if any pain - sepsis workup initiated - CBC, CMP, BC, UC, CT chest-Abd - pelvis, CRP, Procal.   Acute right frontal lobe CVA some questionable hemorrhagic contusion on CT not confirmed on MRI.  Neurology on board, currently on aspirin and statin, stable LDL, echo and recent A1c, had mild left-sided weakness and dysarthria.  Full stroke workup done, defer further recommendations to the neurology team.   C6 vertebral body fracture  CT and MRI although limited reviewed, seen by neurosurgery, c-collar removed by neurosurgery on 05/24/2022, PT OT, may require placement.  HTN more than 4 days out of CVA gradually reintroduce blood pressure medications  HLD  Resume Crestor-increase to 40 mg daily  BPH - Proscar but developed urinary retention, urology was called Foley catheter was placed by Dr. Alvester Morin on 05/25/2023.    Underweight: Estimated body mass index is 20.01 kg/m as calculated from the following:   Height as of this encounter: 5\' 8"  (1.727 m).   Weight as of this encounter: 59.7 kg.   Code status:   Code Status: Full Code   DVT Prophylaxis: SCDs Start: 05/22/23 2315   Family Communication: Son and wife at bedside 05/25/2023, son bedside 05/26/23, daughter [name] [phone] 05/26/23   Disposition Plan: Status is: Inpatient Remains inpatient appropriate because: Severity of illness   Planned Discharge Destination:Home health   Diet: Diet Order  Diet full liquid Room service appropriate? Yes with Assist; Fluid consistency: Thin  Diet effective now                     Antimicrobial agents: Anti-infectives (From admission, onward)    None        MEDICATIONS: Scheduled Meds:  aspirin EC  81 mg Oral Daily   finasteride  5 mg Oral Daily   lidocaine  1 Application Urethral Once    rosuvastatin  40 mg Oral Daily   tamsulosin  0.4 mg Oral Daily   Continuous Infusions:   PRN Meds:.acetaminophen **OR** acetaminophen, labetalol, melatonin, ondansetron (ZOFRAN) IV, polyethylene glycol   I have personally reviewed following labs and imaging studies  LABORATORY DATA:  Recent Labs  Lab 05/20/23 0124 05/22/23 2024 05/23/23 0450 05/24/23 0654 05/25/23 1557  WBC 11.3* 9.8 12.8* 11.4* 13.6*  HGB 13.6 14.1 13.4 13.6 14.1  HCT 41.0 42.5 39.1 40.7 40.9  PLT 326 317 264 275 295  MCV 81.3 81.7 79.5* 80.1 80.7  MCH 27.0 27.1 27.2 26.8 27.8  MCHC 33.2 33.2 34.3 33.4 34.5  RDW 15.7* 15.4 15.0 15.2 15.0  LYMPHSABS  --   --  1.8  --   --   MONOABS  --   --  1.1*  --   --   EOSABS  --   --  0.0  --   --   BASOSABS  --   --  0.1  --   --     Recent Labs  Lab 05/20/23 0124 05/22/23 2024 05/22/23 2226 05/23/23 0450 05/24/23 0445 05/24/23 0654 05/25/23 1557  NA 136 136  --  137  --  138 136  K 4.2 3.8  --  3.2*  --  3.6 4.0  CL 101 102  --  104  --  107 104  CO2 25 23  --  21*  --  20* 18*  ANIONGAP 10 11  --  12  --  11 14  GLUCOSE 128* 114*  --  123*  --  109* 125*  BUN 10 9  --  9  --  10 20  CREATININE 1.16 0.97  --  0.99  --  1.22 1.46*  AST  --  30  --  33  --   --   --   ALT  --  27  --  27  --   --   --   ALKPHOS  --  60  --  57  --   --   --   BILITOT  --  1.2  --  1.7*  --   --   --   ALBUMIN  --  4.3  --  3.9  --   --   --   DDIMER  --   --   --   --  2.01*  --   --   INR  --   --   --  1.2  --   --   --   TSH  --   --  1.019  --   --   --   --   MG  --   --  1.9 1.9  --  2.0 1.8  CALCIUM 9.6 9.6  --  9.3  --  9.6 9.0    Lab Results  Component Value Date   CHOL 116 05/23/2023   HDL 65 05/23/2023   LDLCALC 39 05/23/2023   LDLDIRECT 163.3 05/26/2008  TRIG 59 05/23/2023   CHOLHDL 1.8 05/23/2023   Lab Results  Component Value Date   HGBA1C 5.8 (H) 02/23/2023      Recent Labs  Lab 05/20/23 0124 05/22/23 2024 05/22/23 2226  05/23/23 0450 05/24/23 0445 05/24/23 0654 05/25/23 1557  DDIMER  --   --   --   --  2.01*  --   --   INR  --   --   --  1.2  --   --   --   TSH  --   --  1.019  --   --   --   --   MG  --   --  1.9 1.9  --  2.0 1.8  CALCIUM 9.6 9.6  --  9.3  --  9.6 9.0    RADIOLOGY STUDIES/RESULTS: DG Abd Portable 1V  Result Date: 05/25/2023 CLINICAL DATA:  Constipation.  Confusion.  Fall. EXAM: PORTABLE ABDOMEN - 1 VIEW COMPARISON:  None Available. FINDINGS: Moderate stool is present at the rectum. Mild gaseous distension of bowel is present proximal to this without obstruction. No free air is present. No stone or mass lesion is present. Lung bases are clear. Rightward curvature is present in the lumbar spine. IMPRESSION: 1. Moderate stool at the rectum. 2. Mild gaseous distension of bowel proximal to this without obstruction. Electronically Signed   By: Marin Roberts M.D.   On: 05/25/2023 11:43   DG CHEST PORT 1 VIEW  Result Date: 05/24/2023 CLINICAL DATA:  Tachycardia EXAM: PORTABLE CHEST 1 VIEW COMPARISON:  05/23/2023 FINDINGS: The heart size and mediastinal contours are within normal limits. Both lungs are clear. The visualized skeletal structures are unremarkable. IMPRESSION: No active disease. Electronically Signed   By: Helyn Numbers M.D.   On: 05/24/2023 22:10   MR CERVICAL SPINE WO CONTRAST  Result Date: 05/24/2023 CLINICAL DATA:  C6 vertebral body fracture on CT cervical spine after fall EXAM: MRI CERVICAL SPINE WITHOUT CONTRAST TECHNIQUE: Multiplanar, multisequence MR imaging of the cervical spine was performed. No intravenous contrast was administered. COMPARISON:  No prior MRI available, correlation is made with CT cervical spine 05/22/2023 FINDINGS: Evaluation is significantly limited by motion artifact. Alignment: Reversal of the normal cervical lordosis. Trace stepwise retrolisthesis of C3 on C4, C4 on C5, and C5 on C6. Vertebrae: The known fracture off the anterior inferior aspect of  C1 is not well seen on these images due to motion. Cord: Best seen on the sagittal T2 sequence, no definite cord signal abnormality. No evidence of significant epidural collection. Posterior Fossa, vertebral arteries, paraspinal tissues: Prevertebral fluid, likely related to known fracture. Evaluation for ligamentous disruption at C6-C7 is particularly motion limited. Disc levels: The axial sequences are essentially nondiagnostic. 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 IMPRESSION: 1. Evaluation is significantly limited by motion artifact. The known fracture off the anterior inferior aspect of C1 is not well seen on these images due to motion. 2. 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 3. Prevertebral fluid, likely related to known fracture. 4. Evaluation for ligamentous disruption at C6-C7 is particularly motion limited. Electronically Signed   By: Wiliam Ke M.D.   On: 05/24/2023 21:24   EEG adult  Result Date: 05/24/2023 Charlsie Quest, MD     05/24/2023  7:34 PM Patient Name:  LABON FONT MRN: [id number] Epilepsy Attending: Charlsie Quest Referring Physician/Provider: Elmer Picker, NP Date: 05/24/2023 Duration: 24.51 mins Patient history: 54y M with ams getting eeg to evaluate for seizure Level of alertness: Awake AEDs during EEG study: Ativan Technical aspects: This EEG study was done with scalp electrodes positioned according to the 10-20 International system of electrode placement. Electrical activity was reviewed with band pass filter of 1-70Hz , sensitivity of 7 uV/mm, display speed of 34mm/sec with a 60Hz  notched filter applied as appropriate. EEG data were recorded continuously and digitally stored.  Video monitoring was available and reviewed as appropriate. Description:  The posterior dominant rhythm consists of 8-9 Hz activity of moderate voltage (25-35 uV) seen predominantly in posterior head regions, asymmetric  ( right<left) and reactive to eye opening and eye closing. EEG showed continuous rhythmic 3 to 6 Hz theta-delta slowing in right temporal region. Intermittent generalized 3-6hz  theta-delta slowing was also noted. Hyperventilation and photic stimulation were not performed.   ABNORMALITY - Intermittent slow, generalized - Continuous slow, right temporal region IMPRESSION: This study is  suggestive of cortical dysfunction arising from right temporal region likely secondary to underlying structural abnormality. Additionally there is mild diffuse encephalopathy. No seizures or epileptiform discharges were seen throughout the recording. Charlsie Quest     LOS: 4 days   Signature  -    Susa Raring M.D on 05/26/2023 at 8:36 AM   -  To page go to [email or web address]

## 2023-05-27 ENCOUNTER — Inpatient Hospital Stay (HOSPITAL_COMMUNITY): Payer: Medicare Other

## 2023-05-27 DIAGNOSIS — I639 Cerebral infarction, unspecified: Secondary | ICD-10-CM | POA: Diagnosis not present

## 2023-05-27 LAB — COMPREHENSIVE METABOLIC PANEL
ALT: 59 U/L — ABNORMAL HIGH (ref 0–44)
AST: 87 U/L — ABNORMAL HIGH (ref 15–41)
Albumin: 3.4 g/dL — ABNORMAL LOW (ref 3.5–5.0)
Alkaline Phosphatase: 53 U/L (ref 38–126)
Anion gap: 13 (ref 5–15)
BUN: 24 mg/dL — ABNORMAL HIGH (ref 8–23)
CO2: 19 mmol/L — ABNORMAL LOW (ref 22–32)
Calcium: 9 mg/dL (ref 8.9–10.3)
Chloride: 103 mmol/L (ref 98–111)
Creatinine, Ser: 1.92 mg/dL — ABNORMAL HIGH (ref 0.61–1.24)
GFR, Estimated: 34 mL/min — ABNORMAL LOW (ref >60)
Glucose, Bld: 96 mg/dL (ref 70–99)
Potassium: 3.6 mmol/L (ref 3.5–5.1)
Sodium: 135 mmol/L (ref 135–145)
Total Bilirubin: 0.9 mg/dL (ref 0.3–1.2)
Total Protein: 6.9 g/dL (ref 6.5–8.1)

## 2023-05-27 LAB — CBC WITH DIFFERENTIAL/PLATELET
Abs Immature Granulocytes: 0.14 10*3/uL — ABNORMAL HIGH (ref 0.00–0.07)
Basophils Absolute: 0 10*3/uL (ref 0.0–0.1)
Basophils Relative: 0 %
Eosinophils Absolute: 0.1 10*3/uL (ref 0.0–0.5)
Eosinophils Relative: 0 %
HCT: 41.5 % (ref 39.0–52.0)
Hemoglobin: 14.4 g/dL (ref 13.0–17.0)
Immature Granulocytes: 1 %
Lymphocytes Relative: 4 %
Lymphs Abs: 0.8 10*3/uL (ref 0.7–4.0)
MCH: 28.1 pg (ref 26.0–34.0)
MCHC: 34.7 g/dL (ref 30.0–36.0)
MCV: 81.1 fL (ref 80.0–100.0)
Monocytes Absolute: 1.4 10*3/uL — ABNORMAL HIGH (ref 0.1–1.0)
Monocytes Relative: 6 %
Neutro Abs: 20.4 10*3/uL — ABNORMAL HIGH (ref 1.7–7.7)
Neutrophils Relative %: 89 %
Platelets: 264 10*3/uL (ref 150–400)
RBC: 5.12 MIL/uL (ref 4.22–5.81)
RDW: 14.9 % (ref 11.5–15.5)
WBC: 22.8 10*3/uL — ABNORMAL HIGH (ref 4.0–10.5)
nRBC: 0 % (ref 0.0–0.2)

## 2023-05-27 LAB — HEPARIN LEVEL (UNFRACTIONATED)
Heparin Unfractionated: 0.41 IU/mL (ref 0.30–0.70)
Heparin Unfractionated: 0.41 IU/mL (ref 0.30–0.70)

## 2023-05-27 LAB — C-REACTIVE PROTEIN: CRP: 3.8 mg/dL — ABNORMAL HIGH (ref <1.0)

## 2023-05-27 LAB — MAGNESIUM: Magnesium: 1.9 mg/dL (ref 1.7–2.4)

## 2023-05-27 LAB — PROCALCITONIN: Procalcitonin: 0.33 ng/mL

## 2023-05-27 MED ORDER — MORPHINE SULFATE (PF) 2 MG/ML IV SOLN
1.0000 mg | Freq: Once | INTRAVENOUS | Status: AC
  Administered 2023-05-27: 1 mg via INTRAVENOUS

## 2023-05-27 MED ORDER — MIRTAZAPINE 15 MG PO TBDP
15.0000 mg | ORAL_TABLET | Freq: Every day | ORAL | Status: DC
  Administered 2023-05-27 – 2023-06-04 (×6): 15 mg via ORAL
  Filled 2023-05-27 (×19): qty 1

## 2023-05-27 NOTE — Progress Notes (Addendum)
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 Holding eval as pt with elevated HR at rest per RN. Acute PT to follow.)   Addendum 2:30 pm: Per RN, cancel for the day. RN ordering soft C collar  Eric [name], PT, DPT [facility] [phone]  Arturo Morton Brion Aliment 05/27/2023, 12:54 PM

## 2023-05-27 NOTE — Progress Notes (Addendum)
OT Cancellation Note  Patient Details Name: Eric [name] MRN: [id number] DOB: 1941-05-08   Cancelled Treatment:    Reason Eval/Treat Not Completed: Medical issues which prohibited therapy (Pt found to have thrombus in the mid abdominal aorta, on heparin and therapeutic. Per RN, pt now with elevated HR at rest. OT to hold and check back later.)  Addendum: Per RN, cancel for the day. RN ordering soft C collar  Donia Pounds 05/27/2023, 12:10 PM

## 2023-05-27 NOTE — Progress Notes (Signed)
ANTICOAGULATION CONSULT NOTE  Pharmacy Consult for heparin Indication: stroke  Allergies  Allergen Reactions   Hctz [Hydrochlorothiazide] Other (See Comments)    "Peeing too much"    Patient Measurements: Height: 5\' 8"  (172.7 cm) Weight: 59.7 kg (131 lb 9.8 oz) IBW/kg (Calculated) : 68.4 Heparin Dosing Weight: 60 kg  Vital Signs: Temp: 98.2 F (36.8 C) (09/24 1302) Temp Source: Oral (09/24 1302) BP: 113/73 (09/24 1302) Pulse Rate: 97 (09/24 1302)  Labs: Recent Labs    05/25/23 1557 05/26/23 0931 05/26/23 2152 05/27/23 0450 05/27/23 0638 05/27/23 1503  HGB 14.1 14.6  --  14.4  --   --   HCT 40.9 43.9  --  41.5  --   --   PLT 295 271  --  264  --   --   HEPARINUNFRC  --   --  0.51  --  0.41 0.41  CREATININE 1.46* 1.88*  --  1.92*  --   --     Estimated Creatinine Clearance: 25 mL/min (A) (by C-G formula based on SCr of 1.92 mg/dL (H)).   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  male with history of CVA, not on anticoagulation prior. On aspirin PTA. Acute CVA 9/20, elevated D-dimer. Patient with worsening status tis morning. 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 Pharmacy consulted to start heparin (No bolus - stroke protocol).   Abbreviated recent image history: 9/19>> CT head: 0.9 cm focus of intraparenchymal hemorrhage right frontal lobe.  Acute to subacute cortical infarct left parietal lobe. 9/20>> CT head: Stable 1 cm anterior right middle frontal gyrus hemorrhagic contusion versus trace subarachnoid blood. 9/20>> MRI brain: Acute infarct anterior right frontal lobe. 9/23 >> CT chest/abd: thrombus in the mid abdominal aorta;  images compatible with right renal infarction   Heparin level therapeutic @ 0.41 x2 on 700 units/hr. No issues with heparin infusion or signs of bleeding reported.  Goal of Therapy:  Heparin level 0.3-0.5 units/ml Monitor platelets by anticoagulation protocol: Yes   Plan:  Continue heparin infusion at 700 units/hr Check heparin level daily while on heparin Continue to monitor H&H and platelets   Thank you for allowing pharmacy to be a part of this patient's care.  Thelma Barge, PharmD Clinical Pharmacist

## 2023-05-27 NOTE — Progress Notes (Signed)
PROGRESS NOTE        PATIENT DETAILS Name: Eric [name] Age: 82 y.o. Sex: male Date of Birth: 1941/03/17 Admit Date: 05/22/2023 Admitting Physician Angie Fava, DO AVW:[id number], Georgina Quint, MD  Brief Summary: Patient is a 82 y.o.  male with history of CVA, HTN-who sustained a mechanical fall at home-upon further evaluation-was found to have acute CVA and C6 vertebral body fracture.  Patient was initially evaluated at [facility] and transferred to [facility] service at [facility] for further evaluation  Significant events: 9/19>> admit to [facility]  Significant studies: 6/23>> A1c: 5.8 9/19>> CT head: 0.9 cm focus of intraparenchymal hemorrhage right frontal lobe.  Acute to subacute cortical infarct left parietal lobe. 9/19 >>CT C-spine: Acute nondisplaced fracture through the anterior inferior corner of C6 vertebral body. 9/20>> CT head: Stable 1 cm anterior right middle frontal gyrus hemorrhagic contusion versus trace subarachnoid blood. 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 9/20>> MRI brain: Acute infarct anterior right frontal lobe. 9/20>> echo: EF 55-60% 9/20>> LDL: 112 05/25/2023.  Foley catheter placed by urology 05/26/23 >> CT - 1. 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 Assessment by vascular surgery or interventional radiology is recommended. 2. Extensive wedge-shaped areas of poor enhancement of the right kidney, compatible with right renal infarction. 3. Patchy airspace consolidation within the dependent portion of the left lower lobe, concerning for pneumonia. Small focus of ground-glass opacity within the left upper lobe, which may represent an additional focus of infection. 4.  Thick-walled urinary bladder which is partially decompressed by Foley catheter. Correlate with urinalysis. 5. Patchy increased sclerosis within the T12 vertebral body, nonspecific. Consider further evaluation with nonemergent MRI of the thoracic spine. Aortic Atherosclerosis (ICD10-I70.0) and Emphysema (ICD10-J43.9).  05/27/23 - Ct head -    Significant microbiology data: None  Procedures: None  Consults: Neurology, urology  Subjective:  Patient in bed, appears comfortable, denies any headache, no fever, no chest pain or pressure, no shortness of breath , no abdominal pain. No new focal weakness.   Objective: Vitals: Blood pressure (!) 141/78, pulse 81, temperature 98 F (36.7 C), temperature source Oral, resp. rate 15, height 5\' 8"  (1.727 m), weight 59.7 kg, SpO2 97%.   Exam:  Frail elderly AA male, Awake Alert, No new F.N deficits, Normal affect [place].AT,PERRAL Supple Neck, No JVD,   Symmetrical Chest wall movement, Good air movement bilaterally, CTAB RRR,No Gallops, Rubs or new Murmurs,  +ve B.Sounds, Abd Soft,   No Cyanosis, Clubbing or edema    Assessment/Plan:  Acute right frontal lobe CVA some questionable hemorrhagic contusion on CT not confirmed on MRI.  Neurology on board, currently on aspirin and statin, stable LDL, echo and recent A1c, had mild left-sided weakness and dysarthria.  Full stroke workup done, defer further recommendations to the neurology team.   C6 vertebral body fracture  CT and MRI although limited reviewed, seen by neurosurgery, c-collar removed by neurosurgery on 05/24/2022, PT OT, may require placement.  HTN more than 4 days out of CVA gradually reintroduce blood pressure medications  HLD  Resume Crestor-increase to 40 mg daily  BPH - Proscar but developed urinary retention, urology was  called Foley catheter was placed by Dr. Alvester Morin on 05/25/2023.   Nonspecific abdominal pain and sweats morning of 05/26/2023.  Workup eventually revealed abdominal  aortic thrombus occluding right renal artery, vascular surgery was consulted, case also discussed with neurology Dr. Pearlean Brownie.  He was placed on heparin drip eventually on Eliquis, clinically better.  Head CT repeated to monitor the hemorrhagic conversion's.  Discussed with patient's son and daughter in detail.  May benefit from TEE once he is more stronger in the future.  Patchy increased sclerosis within the T12 vertebral body, nonspecific. Outpatient further evaluation with nonemergent MRI of the thoracic spine.   Underweight: Estimated body mass index is 20.01 kg/m as calculated from the following:   Height as of this encounter: 5\' 8"  (1.727 m).   Weight as of this encounter: 59.7 kg.   Code status:   Code Status: Full Code   DVT Prophylaxis: SCDs Start: 05/22/23 2315   Family Communication: Son and wife at bedside 05/25/2023, son bedside 05/26/23, 05/27/2023, daughter [name] [phone] 05/26/23, 05/27/2023   Disposition Plan: Status is: Inpatient Remains inpatient appropriate because: Severity of illness   Planned Discharge Destination:Home health   Diet: Diet Order             DIET DYS 2 Room service appropriate? No; Fluid consistency: Thin  Diet effective now                     Antimicrobial agents: Anti-infectives (From admission, onward)    Start     Dose/Rate Route Frequency Ordered Stop   05/26/23 1430  Ampicillin-Sulbactam (UNASYN) 3 g in sodium chloride 0.9 % 100 mL IVPB        3 g 200 mL/hr over 30 Minutes Intravenous Every 12 hours 05/26/23 1342          MEDICATIONS: Scheduled Meds:  Chlorhexidine Gluconate Cloth  6 each Topical Daily   finasteride  5 mg Oral Daily   lidocaine  1 Application Urethral Once   rosuvastatin  40 mg Oral Daily   tamsulosin  0.4 mg Oral Daily   Continuous Infusions:  ampicillin-sulbactam (UNASYN) IV Stopped (05/27/23 0455)   heparin 700 Units/hr (05/27/23 0515)   lactated ringers 75 mL/hr at 05/27/23 0515    PRN  Meds:.acetaminophen **OR** acetaminophen, hydrALAZINE, labetalol, melatonin, morphine injection, ondansetron (ZOFRAN) IV, polyethylene glycol   I have personally reviewed following labs and imaging studies  LABORATORY DATA:  Recent Labs  Lab 05/23/23 0450 05/24/23 0654 05/25/23 1557 05/26/23 0931 05/27/23 0450  WBC 12.8* 11.4* 13.6* 18.7* 22.8*  HGB 13.4 13.6 14.1 14.6 14.4  HCT 39.1 40.7 40.9 43.9 41.5  PLT 264 275 295 271 264  MCV 79.5* 80.1 80.7 80.8 81.1  MCH 27.2 26.8 27.8 26.9 28.1  MCHC 34.3 33.4 34.5 33.3 34.7  RDW 15.0 15.2 15.0 14.9 14.9  LYMPHSABS 1.8  --   --  0.9 0.8  MONOABS 1.1*  --   --  1.1* 1.4*  EOSABS 0.0  --   --  0.0 0.1  BASOSABS 0.1  --   --  0.0 0.0    Recent Labs  Lab 05/22/23 2024 05/22/23 2226 05/23/23 0450 05/24/23 0445 05/24/23 0654 05/25/23 1557 05/26/23 0931 05/27/23 0450  NA 136  --  137  --  138 136 138 135  K 3.8  --  3.2*  --  3.6 4.0 4.4 3.6  CL 102  --  104  --  107 104 104 103  CO2 23  --  21*  --  20* 18* 17* 19*  ANIONGAP 11  --  12  --  11 14 17* 13  GLUCOSE 114*  --  123*  --  109* 125* 94 96  BUN 9  --  9  --  10 20 24* 24*  CREATININE 0.97  --  0.99  --  1.22 1.46* 1.88* 1.92*  AST 30  --  33  --   --   --  53* 87*  ALT 27  --  27  --   --   --  36 59*  ALKPHOS 60  --  57  --   --   --  54 53  BILITOT 1.2  --  1.7*  --   --   --  1.3* 0.9  ALBUMIN 4.3  --  3.9  --   --   --  3.7 3.4*  CRP  --   --   --   --   --   --  4.4* 3.8*  DDIMER  --   --   --  2.01*  --   --   --   --   PROCALCITON  --   --   --   --   --   --  0.36 0.33  INR  --   --  1.2  --   --   --   --   --   TSH  --  1.019  --   --   --   --   --   --   HGBA1C  --   --   --   --   --   --  6.3*  --   MG  --  1.9 1.9  --  2.0 1.8  --  1.9  CALCIUM 9.6  --  9.3  --  9.6 9.0 9.8 9.0    Lab Results  Component Value Date   CHOL 116 05/23/2023   HDL 65 05/23/2023   LDLCALC 39 05/23/2023   LDLDIRECT 163.3 05/26/2008   TRIG 59 05/23/2023   CHOLHDL  1.8 05/23/2023   Lab Results  Component Value Date   HGBA1C 6.3 (H) 05/26/2023      Recent Labs  Lab 05/22/23 2226 05/23/23 0450 05/24/23 0445 05/24/23 0654 05/25/23 1557 05/26/23 0931 05/27/23 0450  CRP  --   --   --   --   --  4.4* 3.8*  DDIMER  --   --  2.01*  --   --   --   --   PROCALCITON  --   --   --   --   --  0.36 0.33  INR  --  1.2  --   --   --   --   --   TSH 1.019  --   --   --   --   --   --   HGBA1C  --   --   --   --   --  6.3*  --   MG 1.9 1.9  --  2.0 1.8  --  1.9  CALCIUM  --  9.3  --  9.6 9.0 9.8 9.0    RADIOLOGY STUDIES/RESULTS: CT CHEST ABDOMEN PELVIS W CONTRAST  Result Date: 05/26/2023 CLINICAL DATA:  Sepsis EXAM: CT CHEST, ABDOMEN, AND PELVIS WITH CONTRAST TECHNIQUE: Multidetector CT imaging of the chest, abdomen and pelvis was performed following the standard protocol during bolus administration of intravenous contrast. RADIATION DOSE  REDUCTION: This exam was performed according to the departmental dose-optimization program which includes automated exposure control, adjustment of the mA and/or kV according to patient size and/or use of iterative reconstruction technique. CONTRAST:  75mL OMNIPAQUE IOHEXOL 350 MG/ML SOLN COMPARISON:  None Available. FINDINGS: CT CHEST FINDINGS Cardiovascular: Normal heart size. No pericardial effusion. Thoracic aorta is normal in course and caliber. Scattered atherosclerotic calcifications of the aorta and coronary arteries. Central pulmonary vasculature is within normal limits. Mediastinum/Nodes: No enlarged mediastinal, hilar, or axillary lymph nodes. Thyroid gland, trachea, and esophagus demonstrate no significant findings. Lungs/Pleura: Mild emphysema. Patchy airspace consolidation within the dependent portion of the left lower lobe. Small focus of ground-glass opacity within the left upper lobe. No pleural effusion or pneumothorax. Musculoskeletal: No acute bony abnormality. Patchy increased sclerosis within the T12  vertebral body, nonspecific. Incidental note of a simple appearing lipoma deep to the right pectoralis musculature superiorly measuring approximately 6.1 x 2.7 x 3.0 cm. CT ABDOMEN PELVIS FINDINGS Hepatobiliary: 9 mm low-density lesion in the right hepatic lobe with peripheral discontinuous enhancement that fills in on delayed phase sequences most compatible with a benign hemangioma. No suspicious hepatic abnormality. Unremarkable gallbladder. No hyperdense gallstone. No biliary dilatation. Pancreas: Unremarkable. No pancreatic ductal dilatation or surrounding inflammatory changes. Spleen: Normal in size without focal abnormality. Adrenals/Urinary Tract: Low attenuation thickening of the right adrenal gland without discrete nodule. Unremarkable left adrenal gland. Extensive wedge-shaped areas of poor enhancement of the right kidney. No perinephric fat stranding. No perfusion defect of the left kidney. No renal stone or hydronephrosis. Thick-walled urinary bladder which is partially decompressed by Foley catheter. Stomach/Bowel: Stomach is within normal limits. No evidence of bowel wall thickening, distention, or inflammatory changes. Vascular/Lymphatic: Nonocclusive, pedunculated filling defect in the mid abdominal aorta measuring approximately 2.4 x 1.1 cm in size which occludes the ostium of the right renal artery (series 3, image 67). Scattered atherosclerotic calcification. No lymphadenopathy. Reproductive: Prostate is unremarkable. Other: No free fluid. No abdominopelvic fluid collection. No pneumoperitoneum. No abdominal wall hernia. Musculoskeletal: Advanced multilevel degenerative lumbar spondylosis. Dextrocurvature centered at the thoracolumbar junction. No appreciable lytic or sclerotic bone lesion. IMPRESSION: 1. 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 Assessment by  vascular surgery or interventional radiology is recommended. 2. Extensive wedge-shaped areas of poor enhancement of the right kidney, compatible with right renal infarction. 3. Patchy airspace consolidation within the dependent portion of the left lower lobe, concerning for pneumonia. Small focus of ground-glass opacity within the left upper lobe, which may represent an additional focus of infection. 4. Thick-walled urinary bladder which is partially decompressed by Foley catheter. Correlate with urinalysis. 5. Patchy increased sclerosis within the T12 vertebral body, nonspecific. Consider further evaluation with nonemergent MRI of the thoracic spine. Aortic Atherosclerosis (ICD10-I70.0) and Emphysema (ICD10-J43.9). These results will be called to the ordering clinician or representative by the Radiologist Assistant, and communication documented in the PACS or [employer]. Electronically Signed   By: Duanne Guess D.O.   On: 05/26/2023 12:55   DG Abd Portable 1V  Result Date: 05/25/2023 CLINICAL DATA:  Constipation.  Confusion.  Fall. EXAM: PORTABLE ABDOMEN - 1 VIEW COMPARISON:  None Available. FINDINGS: Moderate stool is present at the rectum. Mild gaseous distension of bowel is present proximal to this without obstruction. No free air is present. No stone or mass lesion is present. Lung bases are clear. Rightward curvature is  present in the lumbar spine. IMPRESSION: 1. Moderate stool at the rectum. 2. Mild gaseous distension of bowel proximal to this without obstruction. Electronically Signed   By: Marin Roberts M.D.   On: 05/25/2023 11:43     LOS: 5 days   Signature  -    Susa Raring M.D on 05/27/2023 at 7:58 AM   -  To page go to [email or web address]

## 2023-05-27 NOTE — Progress Notes (Signed)
ANTICOAGULATION CONSULT NOTE - follow-up  Pharmacy Consult for heparin Indication: stroke  Allergies  Allergen Reactions   Hctz [Hydrochlorothiazide] Other (See Comments)    "Peeing too much"    Patient Measurements: Height: 5\' 8"  (172.7 cm) Weight: 59.7 kg (131 lb 9.8 oz) IBW/kg (Calculated) : 68.4 Heparin Dosing Weight: 59.7 kg  Vital Signs: Temp: 98 F (36.7 C) (09/24 0345) Temp Source: Oral (09/24 0345) BP: 141/78 (09/24 0450) Pulse Rate: 81 (09/24 0450)  Labs: Recent Labs    05/25/23 1557 05/26/23 0931 05/26/23 2152 05/27/23 0450 05/27/23 0638  HGB 14.1 14.6  --  14.4  --   HCT 40.9 43.9  --  41.5  --   PLT 295 271  --  264  --   HEPARINUNFRC  --   --  0.51  --  0.41  CREATININE 1.46* 1.88*  --  1.92*  --     Estimated Creatinine Clearance: 25 mL/min (A) (by C-G formula based on SCr of 1.92 mg/dL (H)).   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  male with history of CVA, not on anticoagulation prior. On aspirin PTA. Acute CVA 9/20, elevated D-dimer. Patient with worsening status tis morning. 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 Pharmacy consulted to start heparin (No bolus - stroke protocol).   Abbreviated recent image history: 9/19>> CT head: 0.9 cm focus of intraparenchymal hemorrhage right frontal lobe.  Acute to subacute cortical infarct left parietal lobe. 9/20>> CT head: Stable 1 cm anterior right middle frontal gyrus hemorrhagic contusion versus trace subarachnoid blood. 9/20>> MRI brain: Acute infarct anterior right frontal lobe. 9/23 >> CT chest/abd: thrombus in the mid abdominal aorta; images compatible with right  renal infarction   HL 0.51 - slightly above goal  9/24 AM update: HL 0.41- therapeutic No signs of bleeding or issues with hep gtt  Goal of Therapy:  Heparin level 0.3-0.5 units/ml Monitor platelets by anticoagulation protocol: Yes   Plan:  Continue heparin infusion at 700 units/hr Check confirmatory heparin level in 8 hours and daily while on heparin Continue to monitor H&H and platelets   Razi Hickle BS, PharmD, BCPS Clinical Pharmacist 05/27/2023 7:36 AM  Contact: [phone] after 3 PM  "Be curious, not judgmental..." -Debbora Dus

## 2023-05-27 NOTE — Plan of Care (Signed)
  Problem: Education: Goal: Knowledge of disease or condition will improve 05/27/2023 0457 by Nicole Cella, RN Outcome: Progressing 05/27/2023 0119 by Nicole Cella, RN Outcome: Progressing Goal: Knowledge of secondary prevention will improve (MUST DOCUMENT ALL) 05/27/2023 0457 by Nicole Cella, RN Outcome: Progressing 05/27/2023 0119 by Nicole Cella, RN Outcome: Progressing Goal: Knowledge of patient specific risk factors will improve Eric [name] N/A or DELETE if not current risk factor) Outcome: Progressing   Problem: Ischemic Stroke/TIA Tissue Perfusion: Goal: Complications of ischemic stroke/TIA will be minimized Outcome: Progressing   Problem: Coping: Goal: Will verbalize positive feelings about self Outcome: Progressing

## 2023-05-27 NOTE — Progress Notes (Signed)
[facility]  Subjective  -states his back pain is somewhat improved today   Objective (!) 141/78 81 98 F (36.7 C) (Oral) 15 97%  Intake/Output Summary (Last 24 hours) at 05/27/2023 0619 Last data filed at 05/27/2023 0537 Gross per 24 hour  Intake 1719.88 ml  Output 1700 ml  Net 19.88 ml    Abdomen without significant rebound or guarding Bilateral femoral pulses palpable Both feet warm  Laboratory Lab Results: Recent Labs    05/26/23 0931 05/27/23 0450  WBC 18.7* 22.8*  HGB 14.6 14.4  HCT 43.9 41.5  PLT 271 264   BMET Recent Labs    05/26/23 0931 05/27/23 0450  NA 138 135  K 4.4 3.6  CL 104 103  CO2 17* 19*  GLUCOSE 94 96  BUN 24* 24*  CREATININE 1.88* 1.92*  CALCIUM 9.8 9.0    COAG Lab Results  Component Value Date   INR 1.2 05/23/2023   INR 1.1 02/22/2023   INR 1.12 07/20/2013   No results found for: "PTT"  Assessment/Planning:  82 year old male admitted with acute stroke with MRI brain showing acute infarct in the right anterior lobe.  Also had a mechanical fall last Thursday.  Has been complaining of back pain at least the last 4 or 5 days in setting of chronic back pain.  Ultimately underwent sepsis workup yesterday with CTA showing nonocclusive thrombus in the abdominal aorta at the level of the renals with what looks like an occluded right renal artery.    Again, there is really no contrast enhancement of the right kidney and I suspect his right kidney is infarcted.  That would explain his white count.  Continue heparin.  Creatinine hopefully peaked from 1.88 ---> 1.92.  Making good urine with 1700 mL recorded last 24 hours.  Left kidney looks well-perfused with a widely patent renal on CT.  I do not think there is really any benefit to attempting percutaneous thrombectomy of his right renal given duration of symptoms for at least for 5+ days and any ischemic insult would be nonreversible at this point.    I have  discussed with my partners as well who are in agreement.  Cephus Shelling 05/27/2023 6:19 AM --

## 2023-05-27 NOTE — Plan of Care (Signed)
  Problem: Education: Goal: Knowledge of disease or condition will improve Outcome: Progressing Goal: Knowledge of secondary prevention will improve (MUST DOCUMENT ALL) Outcome: Progressing Goal: Knowledge of patient specific risk factors will improve Loraine Leriche N/A or DELETE if not current risk factor) Outcome: Progressing   Problem: Ischemic Stroke/TIA Tissue Perfusion: Goal: Complications of ischemic stroke/TIA will be minimized Outcome: Progressing   Problem: Coping: Goal: Will verbalize positive feelings about self Outcome: Progressing   Problem: Self-Care: Goal: Ability to participate in self-care as condition permits will improve Outcome: Progressing Goal: Verbalization of feelings and concerns over difficulty with self-care will improve Outcome: Progressing

## 2023-05-28 ENCOUNTER — Other Ambulatory Visit (HOSPITAL_COMMUNITY): Payer: Self-pay

## 2023-05-28 ENCOUNTER — Inpatient Hospital Stay (HOSPITAL_COMMUNITY): Payer: Medicare Other

## 2023-05-28 DIAGNOSIS — W19XXXA Unspecified fall, initial encounter: Secondary | ICD-10-CM | POA: Diagnosis not present

## 2023-05-28 DIAGNOSIS — S12501A Unspecified nondisplaced fracture of sixth cervical vertebra, initial encounter for closed fracture: Secondary | ICD-10-CM | POA: Diagnosis not present

## 2023-05-28 DIAGNOSIS — Z789 Other specified health status: Secondary | ICD-10-CM

## 2023-05-28 DIAGNOSIS — I639 Cerebral infarction, unspecified: Secondary | ICD-10-CM | POA: Diagnosis not present

## 2023-05-28 DIAGNOSIS — S0636AA Traumatic hemorrhage of cerebrum, unspecified, with loss of consciousness status unknown, initial encounter: Secondary | ICD-10-CM | POA: Diagnosis not present

## 2023-05-28 DIAGNOSIS — Z711 Person with feared health complaint in whom no diagnosis is made: Secondary | ICD-10-CM

## 2023-05-28 LAB — COMPREHENSIVE METABOLIC PANEL
ALT: 56 U/L — ABNORMAL HIGH (ref 0–44)
AST: 71 U/L — ABNORMAL HIGH (ref 15–41)
Albumin: 2.8 g/dL — ABNORMAL LOW (ref 3.5–5.0)
Alkaline Phosphatase: 53 U/L (ref 38–126)
Anion gap: 14 (ref 5–15)
BUN: 19 mg/dL (ref 8–23)
CO2: 20 mmol/L — ABNORMAL LOW (ref 22–32)
Calcium: 8.8 mg/dL — ABNORMAL LOW (ref 8.9–10.3)
Chloride: 102 mmol/L (ref 98–111)
Creatinine, Ser: 1.83 mg/dL — ABNORMAL HIGH (ref 0.61–1.24)
GFR, Estimated: 36 mL/min — ABNORMAL LOW (ref >60)
Glucose, Bld: 78 mg/dL (ref 70–99)
Potassium: 3.1 mmol/L — ABNORMAL LOW (ref 3.5–5.1)
Sodium: 136 mmol/L (ref 135–145)
Total Bilirubin: 0.9 mg/dL (ref 0.3–1.2)
Total Protein: 6.1 g/dL — ABNORMAL LOW (ref 6.5–8.1)

## 2023-05-28 LAB — CBC WITH DIFFERENTIAL/PLATELET
Abs Immature Granulocytes: 0.14 10*3/uL — ABNORMAL HIGH (ref 0.00–0.07)
Basophils Absolute: 0 10*3/uL (ref 0.0–0.1)
Basophils Relative: 0 %
Eosinophils Absolute: 0 10*3/uL (ref 0.0–0.5)
Eosinophils Relative: 0 %
HCT: 38.6 % — ABNORMAL LOW (ref 39.0–52.0)
Hemoglobin: 12.9 g/dL — ABNORMAL LOW (ref 13.0–17.0)
Immature Granulocytes: 1 %
Lymphocytes Relative: 4 %
Lymphs Abs: 0.9 10*3/uL (ref 0.7–4.0)
MCH: 26.7 pg (ref 26.0–34.0)
MCHC: 33.4 g/dL (ref 30.0–36.0)
MCV: 79.9 fL — ABNORMAL LOW (ref 80.0–100.0)
Monocytes Absolute: 1.3 10*3/uL — ABNORMAL HIGH (ref 0.1–1.0)
Monocytes Relative: 6 %
Neutro Abs: 19.7 10*3/uL — ABNORMAL HIGH (ref 1.7–7.7)
Neutrophils Relative %: 89 %
Platelets: 218 10*3/uL (ref 150–400)
RBC: 4.83 MIL/uL (ref 4.22–5.81)
RDW: 14.7 % (ref 11.5–15.5)
WBC: 22.1 10*3/uL — ABNORMAL HIGH (ref 4.0–10.5)
nRBC: 0 % (ref 0.0–0.2)

## 2023-05-28 LAB — URINE CULTURE: Culture: NO GROWTH

## 2023-05-28 LAB — C-REACTIVE PROTEIN: CRP: 17.2 mg/dL — ABNORMAL HIGH (ref <1.0)

## 2023-05-28 LAB — MAGNESIUM: Magnesium: 1.9 mg/dL (ref 1.7–2.4)

## 2023-05-28 LAB — HEPARIN LEVEL (UNFRACTIONATED): Heparin Unfractionated: 0.28 IU/mL — ABNORMAL LOW (ref 0.30–0.70)

## 2023-05-28 LAB — PROCALCITONIN: Procalcitonin: 0.55 ng/mL

## 2023-05-28 MED ORDER — APIXABAN 5 MG PO TABS
5.0000 mg | ORAL_TABLET | Freq: Two times a day (BID) | ORAL | Status: DC
  Administered 2023-05-28 – 2023-06-01 (×9): 5 mg via ORAL
  Filled 2023-05-28 (×11): qty 1

## 2023-05-28 MED ORDER — POTASSIUM CHLORIDE 10 MEQ/100ML IV SOLN
10.0000 meq | INTRAVENOUS | Status: AC
  Administered 2023-05-28 (×3): 10 meq via INTRAVENOUS
  Filled 2023-05-28 (×3): qty 100

## 2023-05-28 MED ORDER — LACTATED RINGERS IV SOLN: INTRAVENOUS | Status: AC

## 2023-05-28 MED ORDER — POTASSIUM CHLORIDE 10 MEQ/100ML IV SOLN
INTRAVENOUS | Status: AC
  Administered 2023-05-28: 10 meq via INTRAVENOUS
  Filled 2023-05-28: qty 100

## 2023-05-28 NOTE — Evaluation (Signed)
Physical Therapy Evaluation Patient Details Name: Eric [name] MRN: [id number] DOB: November 03, 1940 Today's Date: 05/28/2023  History of Present Illness  82 y.o. male presents to [facility] hospital on 05/23/2023 as a transfer from [facility] after a fall. CT head suggestive of acute to subacute L parietal infarct, as well as IPH in R frontal lobe. 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 Pt also found to have C6 vertebral body fx. PMH includes CVA (02/2023), HTN.   Clinical Impression  Pt in bed upon arrival with family present. Pt agreeable to PT eval. Pt presents to therapy session below baseline with decreased orientation, overall strength, general deconditioning, impaired balance, and decreased functional mobility. Pt was limited in today's session by back pain and per family, has not been eating well for the past 7 days. Pt required MaxA to [facility] for bed mobility. Pt was only able to tolerate ~20 sec sitting EOB with MaxA support before requesting to lay down. Pt would benefit from acute skilled PT to address functional impairments. Recommending post-acute therapy <3 hrs to gain independence with mobility. Acute PT to follow.          If plan is discharge home, recommend the following: A lot of help with walking and/or transfers;A lot of help with bathing/dressing/bathroom;Direct supervision/assist for medications management;Supervision due to cognitive status;Assist for transportation;Assistance with cooking/housework;Assistance with feeding;Help with stairs or ramp for entrance   Can travel by private vehicle   No    Equipment Recommendations None recommended by PT     Functional Status Assessment Patient has had a recent decline in their functional status and demonstrates the ability to make significant improvements in function in a reasonable and predictable amount of time.     Precautions / Restrictions Precautions Precautions: Fall Required  Braces or Orthoses:  (soft cervical collar for comfort) Restrictions Weight Bearing Restrictions: No      Mobility  Bed Mobility Overal bed mobility: Needs Assistance Bed Mobility: Supine to Sit, Sit to Supine     Supine to sit: Max assist, HOB elevated, Used rails Sit to supine: Total assist, HOB elevated   General bed mobility comments: pt able to grab rail, however, was not able to assist with elevating trunk. Pt was able to move B LE, however, was not following commands to move LE towards EOB. Totalx2 to move towards HOB in supine.    Modified Rankin- 4: moderately severe disability  Transfers        General transfer comment: nt, pt only able to tolerate ~20 seconds sitting EOB before requesting to lay down       Balance Overall balance assessment: Needs assistance Sitting-balance support: Bilateral upper extremity supported, Feet supported Sitting balance-Leahy Scale: Poor Sitting balance - Comments: Able to sit EOB ~20 sec with MaxA Postural control: Left lateral lean     Standing balance comment: nt due to pain         Pertinent Vitals/Pain Pain Assessment Faces Pain Scale: Hurts whole lot Pain Location: back Pain Descriptors / Indicators: Discomfort, Grimacing, Guarding Pain Intervention(s): Limited activity within patient's tolerance, Monitored during session, Repositioned    Home Living Family/patient expects to be discharged to:: Private residence Living Arrangements: Spouse/significant other;Children (two sons, mother) Available Help at Discharge: Family;Available 24 hours/day Type of Home: House Home Access: Stairs to enter Entrance Stairs-Rails: None Entrance Stairs-Number of Steps: 2   Home Layout: One level Home Equipment: Toilet riser;Cane - single point;Rolling Walker (2 wheels);BSC/3in1;Shower seat  Prior Function Prior Level of Function : Needs assist       Physical Assist : ADLs (physical) Mobility (physical): Gait ADLs  (physical): Dressing;Bathing;Toileting Mobility Comments: SP cane outside with assist due to low vision, furniture surfs in the home, fall last week. ADLs Comments: help for decreased vision, able to perform pericare, assist for dressing and bathing past 2 weeks     Extremity/Trunk Assessment   Upper Extremity Assessment Upper Extremity Assessment: Defer to OT evaluation    Lower Extremity Assessment Lower Extremity Assessment: Generalized weakness    Cervical / Trunk Assessment Cervical / Trunk Assessment: Kyphotic  Communication   Communication Communication: No apparent difficulties Cueing Techniques: Verbal cues;Tactile cues  Cognition Arousal: Lethargic Behavior During Therapy: Anxious Overall Cognitive Status: Impaired/Different from baseline Area of Impairment: Memory, Orientation        Orientation Level: Disoriented to, Time        General Comments: family states he has "been out of it" while in the hospital due to pain. Was not orientated to month/year which is abnormal                  PT Assessment Patient needs continued PT services  PT Problem List Decreased strength;Decreased cognition;Decreased activity tolerance;Decreased balance;Decreased mobility;Decreased safety awareness       PT Treatment Interventions DME instruction;Gait training;Neuromuscular re-education;Stair training;Cognitive remediation;Patient/family education;Functional mobility training;Therapeutic activities;Therapeutic exercise;Balance training    PT Goals (Current goals can be found in the Care Plan section)  Acute Rehab PT Goals Patient Stated Goal: to get better PT Goal Formulation: With patient/family Time For Goal Achievement: 06/11/23 Potential to Achieve Goals: Fair    Frequency Min 1X/week        AM-PAC PT "6 Clicks" Mobility  Outcome Measure Help needed turning from your back to your side while in a flat bed without using bedrails?: A Lot Help needed moving  from lying on your back to sitting on the side of a flat bed without using bedrails?: A Lot Help needed moving to and from a bed to a chair (including a wheelchair)?: Total Help needed standing up from a chair using your arms (e.g., wheelchair or bedside chair)?: Total Help needed to walk in hospital room?: Total Help needed climbing 3-5 steps with a railing? : Total 6 Click Score: 8    End of Session   Activity Tolerance: Patient limited by pain;Patient limited by fatigue Patient left: in bed;with family/visitor present;with call bell/phone within reach;with bed alarm set Nurse Communication: Mobility status PT Visit Diagnosis: Other abnormalities of gait and mobility (R26.89);Muscle weakness (generalized) (M62.81);History of falling (Z91.81)    Time: 7829-5621 PT Time Calculation (min) (ACUTE ONLY): 25 min   Charges:   PT Evaluation $PT Eval Moderate Complexity: 1 Mod   PT General Charges $$ ACUTE PT VISIT: 1 Visit         Hilton Cork, PT, DPT Secure Chat Preferred  Rehab Office [phone]   Arturo Morton Brion Aliment 05/28/2023, 2:33 PM

## 2023-05-28 NOTE — TOC Benefit Eligibility Note (Signed)
 Patient Product/process development scientist completed.    The patient is insured through [facility]. Patient has Medicare and is not eligible for a copay card, but may be able to apply for patient assistance, if available.    Ran test claim for Eliquis 5 mg and the current 30 day co-pay is $47.00.   This test claim was processed through [facility]- copay amounts may vary at other pharmacies due to pharmacy/plan contracts, or as the patient moves through the different stages of their insurance plan.     Eric [name], CPHT Pharmacy Technician III Certified Patient Advocate [facility] Patient Advocate Team Direct Number: [phone]  Fax: [phone]

## 2023-05-28 NOTE — Progress Notes (Addendum)
ANTICOAGULATION CONSULT NOTE- follow-up  Pharmacy Consult for heparin Indication: stroke  Allergies  Allergen Reactions   Hctz [Hydrochlorothiazide] Other (See Comments)    "Peeing too much"    Patient Measurements: Height: 5\' 8"  (172.7 cm) Weight: 59.7 kg (131 lb 9.8 oz) IBW/kg (Calculated) : 68.4 Heparin Dosing Weight: 60 kg  Vital Signs: Temp: 98.2 F (36.8 C) (09/25 0400) Temp Source: Oral (09/25 0400) BP: 146/82 (09/25 0543) Pulse Rate: 91 (09/25 0543)  Labs: Recent Labs    05/26/23 0931 05/26/23 2152 05/27/23 0450 05/27/23 0638 05/27/23 1503 05/28/23 0329  HGB 14.6  --  14.4  --   --  12.9*  HCT 43.9  --  41.5  --   --  38.6*  PLT 271  --  264  --   --  218  HEPARINUNFRC  --    < >  --  0.41 0.41 0.28*  CREATININE 1.88*  --  1.92*  --   --  1.83*   < > = values in this interval not displayed.    Estimated Creatinine Clearance: 26.3 mL/min (A) (by C-G formula based on SCr of 1.83 mg/dL (H)).   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  male with history of CVA, not on anticoagulation prior. On aspirin PTA. Acute CVA 9/20, elevated D-dimer. Patient with worsening status tis morning. 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 Pharmacy consulted to start heparin (No bolus - stroke protocol).   Abbreviated recent image history: 9/19>> CT head: 0.9 cm focus of intraparenchymal hemorrhage right frontal lobe.  Acute to subacute cortical infarct left parietal lobe. 9/20>> CT head: Stable 1 cm anterior right middle frontal gyrus hemorrhagic contusion versus trace subarachnoid blood. 9/20>> MRI brain: Acute infarct anterior right frontal  lobe. 9/23 >> CT chest/abd: thrombus in the mid abdominal aorta; images compatible with right renal infarction   Heparin level therapeutic @ 0.41 x2 on 700 units/hr. No issues with heparin infusion or signs of bleeding reported.  9/25 AM update: HL 0.28 No signs of bleeding or issues w/ hep gtt per nursing Consult received to transition to apixaban- lowest possible dose   Goal of Therapy:  Heparin level 0.3-0.5 units/ml Monitor platelets by anticoagulation protocol: Yes   Plan: DC heparin infusion Start apixaban 5 mg po bid Continue to monitor H&H and platelets Pharmacy will sign off consult but continue to monitor making recs prn   Thank you for allowing pharmacy to be a part of this patient's care.  Greta Doom BS, PharmD, BCPS Clinical Pharmacist 05/28/2023 8:42 AM  Contact: [phone] after 3 PM  "Be curious, not judgmental..." -Debbora Dus

## 2023-05-28 NOTE — Evaluation (Signed)
Occupational Therapy Evaluation Patient Details Name: Eric [name] MRN: [id number] DOB: [birth year] Today's Date: 05/28/2023   History of Present Illness 82 y.o. male presents to [facility] hospital on 05/23/2023 as a transfer from [facility] ED after a fall. CT head suggestive of acute to subacute L parietal infarct, as well as IPH in R frontal lobe. 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 Pt also found to have C6 vertebral body fx. PMH includes CVA (02/2023), HTN.   Clinical Impression   Pt admitted for above, he displays some R inattention but does perform AROM of his RUE/RLE on command although he may not perform the movement in the manner it was asked. Pt also noted to have other visual deficits (see below) which are also impacting his functional capabilities. He did not tolerate EOB with PT so that was deferred at this time, based on PT session and his need for Max A from OT with rolling the pt will likely need significant assist for EOB/OOB. OT to continue to follow pt acutely to address deficits and help transition to next level of care, recommend palliative consult for family to use at their discretion. Patient would benefit from post acute skilled rehab facility with <3 hours of therapy and 24/7 support       If plan is discharge home, recommend the following: Two people to help with walking and/or transfers;A lot of help with bathing/dressing/bathroom;Assistance with feeding;Assistance with cooking/housework;Direct supervision/assist for financial management;Supervision due to cognitive status;Direct supervision/assist for medications management;Help with stairs or ramp for entrance    Functional Status Assessment  Patient has had a recent decline in their functional status and/or demonstrates limited ability to make significant improvements in function in a reasonable and predictable amount of time  Equipment Recommendations  None recommended by OT  (TBD at next level of care)    Recommendations for Other Services Other (comment) (Palliative consult)     Precautions / Restrictions Precautions Precautions: Fall Required Braces or Orthoses: Other Brace Other Brace: soft cervical collar for comfort Restrictions Weight Bearing Restrictions: No      Mobility Bed Mobility               General bed mobility comments: deferred due to intolerance just prior with PT    Transfers                   General transfer comment: NT      Balance   Sitting-balance support: Feet supported                                       ADL either performed or assessed with clinical judgement   ADL Overall ADL's : Needs assistance/impaired Eating/Feeding: Maximal assistance;Bed level   Grooming: Bed level;Set up Grooming Details (indicate cue type and reason): needs ADL object placed in hand, inability to locate object. Upper Body Bathing: Bed level;Moderate assistance   Lower Body Bathing: Total assistance;Bed level   Upper Body Dressing : Bed level;Maximal assistance   Lower Body Dressing: Total assistance;Bed level   Toilet Transfer: Total assistance;+2 for physical assistance   Toileting- Clothing Manipulation and Hygiene: Total assistance         General ADL Comments: Pt not tolerating EOB sitting with PT just 30 mins prior to OT session     Vision   Additional Comments: Pt not tracking with his R  eye, most of the time he kept it closed but would open in on command. R inattention but is aware and uses his RUE when prompted. Undershoots with BUEs but could be due to impaired vision. Pt reports his vision has been blurry and he sees double from time to time, inaccurate repsonse for # of therapists fingers during visual testing. Did not scan and move his neck past midline but he reports it was because it hurt.     Perception         Praxis         Pertinent Vitals/Pain Pain Assessment Pain  Assessment: Faces Faces Pain Scale: Hurts little more Pain Location: back Pain Descriptors / Indicators: Discomfort, Grimacing, Guarding, Aching Pain Intervention(s): Limited activity within patient's tolerance, Monitored during session, Repositioned     Extremity/Trunk Assessment Upper Extremity Assessment Upper Extremity Assessment: Generalized weakness;Left hand dominant (PROM RUE WFL, LUE AROM WFL)   Lower Extremity Assessment Lower Extremity Assessment: Generalized weakness   Cervical / Trunk Assessment Cervical / Trunk Assessment: Kyphotic   Communication Communication Communication: No apparent difficulties Cueing Techniques: Verbal cues;Tactile cues   Cognition Arousal: Lethargic Behavior During Therapy: Flat affect Overall Cognitive Status: Impaired/Different from baseline Area of Impairment: Attention, Following commands, Safety/judgement, Problem solving                   Current Attention Level: Sustained   Following Commands: Follows one step commands with increased time Safety/Judgement: Decreased awareness of deficits   Problem Solving: Slow processing, Requires verbal cues, Requires tactile cues General Comments: Pt reports his vision is fine, then states later how things have been blurry. Pt family reports his vision has actually been on a decline in the recent month     General Comments  BP 167/88 while supine in bed, educated family on the benefits of rolling and pillow positioning for pressure relief    Exercises     Shoulder Instructions      Home Living Family/patient expects to be discharged to:: Private residence Living Arrangements: Spouse/significant other;Children (two sons, mother) Available Help at Discharge: Family;Available 24 hours/day Type of Home: House Home Access: Stairs to enter [employer] of Steps: 2 Entrance Stairs-Rails: None Home Layout: One level     Bathroom Shower/Tub: Contractor: Standard     Home Equipment: Toilet riser;Cane - single point;Rolling Environmental consultant (2 wheels);BSC/3in1;Shower seat          Prior Functioning/Environment Prior Level of Function : Needs assist       Physical Assist : ADLs (physical) Mobility (physical): Gait ADLs (physical): Dressing;Bathing;Toileting Mobility Comments: SP cane outside with assist due to low vision, furniture surfs in the home, fall last week. ADLs Comments: help for decreased vision, able to perform pericare, assist for dressing and bathing past 2 weeks        OT Problem List: Decreased cognition;Impaired balance (sitting and/or standing);Impaired vision/perception      OT Treatment/Interventions:      OT Goals(Current goals can be found in the care plan section) Acute Rehab OT Goals Patient Stated Goal: To start doing better OT Goal Formulation: With family Time For Goal Achievement: 06/11/23 Potential to Achieve Goals: Fair ADL Goals Pt Will Perform Upper Body Bathing: bed level;with set-up Additional ADL Goal #1: Pt will demonstrate improved attention to R side by using scanning strategies to locate 3/3 objects on that side Additional ADL Goal #2: Pt will tolerate 5 mins sitting EOB with min A in  preparation for seated bADLs. Additional ADL Goal #3: Pt family will demonstrate independence with rolling pt L<>R in bed for pressure relief.  OT Frequency:      Co-evaluation              AM-PAC OT "6 Clicks" Daily Activity     Outcome Measure Help from another person eating meals?: A Lot Help from another person taking care of personal grooming?: A Little Help from another person toileting, which includes using toliet, bedpan, or urinal?: Total Help from another person bathing (including washing, rinsing, drying)?: A Lot Help from another person to put on and taking off regular upper body clothing?: A Lot Help from another person to put on and taking off regular lower body clothing?: Total 6  Click Score: 11   End of Session Nurse Communication: Mobility status (total A)  Activity Tolerance: Patient limited by lethargy Patient left: in bed;with call bell/phone within reach;with bed alarm set  OT Visit Diagnosis: Low vision, both eyes (H54.2);Other abnormalities of gait and mobility (R26.89)                Time: 1610-9604 OT Time Calculation (min): 27 min Charges:  OT General Charges $OT Visit: 1 Visit OT Evaluation $OT Eval Moderate Complexity: 1 Mod OT Treatments $Therapeutic Activity: 8-22 mins  05/28/2023  AB, OTR/L  Acute Rehabilitation Services  Office: [phone]   Tristan Schroeder 05/28/2023, 3:59 PM

## 2023-05-28 NOTE — TOC Progression Note (Signed)
Transition of Care [facility]) - Progression Note    Patient Details  Name: Eric [name] MRN: [id number] Date of Birth: 1941-07-25  [facility] CM/SW Contact  Mearl Latin, LCSW Phone Number: 05/28/2023, 1:56 PM  Clinical Narrative:    CSW following for needs.    Expected Discharge Plan: Skilled Nursing Facility Barriers to Discharge: Continued Medical Work up, English as a second language teacher, SNF Pending bed offer  Expected Discharge Plan and Services In-house Referral: Clinical Social Work     Living arrangements for the past 2 months: Single Family Home                                       Social Determinants of Health (SDOH) Interventions SDOH Screenings   Food Insecurity: No Food Insecurity (02/26/2023)  Housing: Low Risk  (02/22/2023)  Transportation Needs: No Transportation Needs (02/26/2023)  Utilities: Not At Risk (02/22/2023)  Depression (PHQ2-9): Low Risk  (04/17/2023)  Tobacco Use: Medium Risk (05/22/2023)    Readmission Risk Interventions     No data to display

## 2023-05-28 NOTE — Progress Notes (Signed)
PROGRESS NOTE        PATIENT DETAILS Name: Eric [name] Age: 82 y.o. Sex: male Date of Birth: May 29, 1941 Admit Date: 05/22/2023 Admitting Physician Angie Fava, DO ZOX:[id number], Georgina Quint, MD  Brief Summary: Patient is a 82 y.o.  male with history of CVA, HTN-who sustained a mechanical fall at home-upon further evaluation-was found to have acute CVA and C6 vertebral body fracture.  Patient was initially evaluated at [facility] and transferred to [facility] service at [facility] for further evaluation  Significant events: 9/19>> admit to [facility]  Significant studies: 6/23>> A1c: 5.8 9/19>> CT head: 0.9 cm focus of intraparenchymal hemorrhage right frontal lobe.  Acute to subacute cortical infarct left parietal lobe. 9/19 >>CT C-spine: Acute nondisplaced fracture through the anterior inferior corner of C6 vertebral body. 9/20>> CT head: Stable 1 cm anterior right middle frontal gyrus hemorrhagic contusion versus trace subarachnoid blood. 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 9/20>> MRI brain: Acute infarct anterior right frontal lobe. 9/20>> echo: EF 55-60% 9/20>> LDL: 112 05/25/2023.  Foley catheter placed by urology 05/26/23 >> CT - 1. 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 Assessment by vascular surgery or interventional radiology is recommended. 2. Extensive wedge-shaped areas of poor enhancement of the right kidney, compatible with right renal infarction. 3. Patchy airspace consolidation within the dependent portion of the left lower lobe, concerning for pneumonia. Small focus of ground-glass opacity within the left upper lobe, which may represent an additional focus of infection. 4.  Thick-walled urinary bladder which is partially decompressed by Foley catheter. Correlate with urinalysis. 5. Patchy increased sclerosis within the T12 vertebral body, nonspecific. Consider further evaluation with nonemergent MRI of the thoracic spine. Aortic Atherosclerosis (ICD10-I70.0) and Emphysema (ICD10-J43.9).  05/27/23 - Ct head -    Significant microbiology data: None  Procedures: None  Consults: Neurology, urology  Subjective: Patient in bed, appears comfortable, denies any headache, no fever, no chest pain or pressure, no shortness of breath , no abdominal pain. No focal weakness.   Objective: Vitals: Blood pressure (!) 146/82, pulse 91, temperature 98.2 F (36.8 C), temperature source Oral, resp. rate 19, height 5\' 8"  (1.727 m), weight 59.7 kg, SpO2 98%.   Exam:  Frail elderly AA male, Awake Alert, No new F.N deficits, Normal affect [place].AT,PERRAL Supple Neck, No JVD,   Symmetrical Chest wall movement, Good air movement bilaterally, CTAB RRR,No Gallops, Rubs or new Murmurs,  +ve B.Sounds, Abd Soft,   No Cyanosis, Clubbing or edema    Assessment/Plan:  Acute right frontal lobe CVA some questionable hemorrhagic contusion on CT not confirmed on MRI.  Neurology on board, currently on aspirin and statin, stable LDL, echo and recent A1c, had mild left-sided weakness and dysarthria.  Full stroke workup done, defer further recommendations to the neurology team.   C6 vertebral body fracture  CT and MRI although limited reviewed, seen by neurosurgery, hard c-collar removed by neurosurgery on 05/24/2022, PT OT, has subjectives comfort hence placed a soft c-collar on 05/27/2023.  HTN more than 4 days out of CVA gradually reintroduce blood pressure medications  HLD  Resume Crestor-increase to 40 mg daily  BPH - Proscar  but developed urinary retention, urology was called Foley catheter was placed by Dr. Alvester Morin on 05/25/2023.   Nonspecific abdominal pain and sweats morning of  05/26/2023.  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 Pearlean Brownie.  He was placed on heparin drip eventually on Eliquis, clinically better.  Head CT repeated to monitor the hemorrhagic conversion's.  Discussed with patient's son and daughter in detail.  May benefit from TEE once he is more stronger in the future.  Patchy increased sclerosis within the T12 vertebral body, nonspecific. Outpatient further evaluation with nonemergent MRI of the thoracic spine.  Possible aspiration pneumonia.  Unasyn and monitor.  Speech following.  Hypokalemia.  Replace.  Overall cachexia, failure to thrive, poor oral intake ongoing for several months, Remeron added for appetite stimulation, palliative care for goals of care for long-term.    Underweight: Estimated body mass index is 20.01 kg/m as calculated from the following:   Height as of this encounter: 5\' 8"  (1.727 m).   Weight as of this encounter: 59.7 kg.   Code status:   Code Status: Full Code   DVT Prophylaxis: SCDs Start: 05/22/23 2315   Family Communication: Son and wife at bedside 05/25/2023, son bedside 05/26/23, 05/27/2023, daughter [name] [phone] 05/26/23, 05/27/2023   Disposition Plan: Status is: Inpatient Remains inpatient appropriate because: Severity of illness   Planned Discharge Destination:Home health   Diet: Diet Order             DIET DYS 2 Room service appropriate? No; Fluid consistency: Thin  Diet effective now                     Antimicrobial agents: Anti-infectives (From admission, onward)    Start     Dose/Rate Route Frequency Ordered Stop   05/26/23 1430  Ampicillin-Sulbactam (UNASYN) 3 g in sodium chloride 0.9 % 100 mL IVPB        3 g 200 mL/hr over 30 Minutes Intravenous Every 12 hours 05/26/23 1342          MEDICATIONS: Scheduled Meds:  Chlorhexidine Gluconate Cloth  6 each Topical  Daily   finasteride  5 mg Oral Daily   lidocaine  1 Application Urethral Once   mirtazapine  15 mg Oral QHS   rosuvastatin  40 mg Oral Daily   tamsulosin  0.4 mg Oral Daily   Continuous Infusions:  ampicillin-sulbactam (UNASYN) IV 3 g (05/28/23 0307)   heparin 700 Units/hr (05/28/23 0112)   lactated ringers     potassium chloride      PRN Meds:.acetaminophen **OR** acetaminophen, hydrALAZINE, labetalol, melatonin, morphine injection, ondansetron (ZOFRAN) IV, polyethylene glycol   I have personally reviewed following labs and imaging studies  LABORATORY DATA:  Recent Labs  Lab 05/23/23 0450 05/24/23 0654 05/25/23 1557 05/26/23 0931 05/27/23 0450 05/28/23 0329  WBC 12.8* 11.4* 13.6* 18.7* 22.8* 22.1*  HGB 13.4 13.6 14.1 14.6 14.4 12.9*  HCT 39.1 40.7 40.9 43.9 41.5 38.6*  PLT 264 275 295 271 264 218  MCV 79.5* 80.1 80.7 80.8 81.1 79.9*  MCH 27.2 26.8 27.8 26.9 28.1 26.7  MCHC 34.3 33.4 34.5 33.3 34.7 33.4  RDW 15.0 15.2 15.0 14.9 14.9 14.7  LYMPHSABS 1.8  --   --  0.9 0.8 0.9  MONOABS 1.1*  --   --  1.1* 1.4* 1.3*  EOSABS 0.0  --   --  0.0 0.1 0.0  BASOSABS 0.1  --   --  0.0 0.0 0.0    Recent Labs  Lab 05/22/23 2024 05/22/23 2024 05/22/23 2226 05/23/23 0450 05/24/23 0445 05/24/23 0654 05/25/23 1557 05/26/23 0931 05/27/23 0450 05/28/23 0329  NA 136  --   --  137  --  138 136 138 135 136  K 3.8  --   --  3.2*  --  3.6 4.0 4.4 3.6 3.1*  CL 102  --   --  104  --  107 104 104 103 102  CO2 23  --   --  21*  --  20* 18* 17* 19* 20*  ANIONGAP 11  --   --  12  --  11 14 17* 13 14  GLUCOSE 114*  --   --  123*  --  109* 125* 94 96 78  BUN 9  --   --  9  --  10 20 24* 24* 19  CREATININE 0.97  --   --  0.99  --  1.22 1.46* 1.88* 1.92* 1.83*  AST 30  --   --  33  --   --   --  53* 87* 71*  ALT 27  --   --  27  --   --   --  36 59* 56*  ALKPHOS 60  --   --  57  --   --   --  54 53 53  BILITOT 1.2  --   --  1.7*  --   --   --  1.3* 0.9 0.9  ALBUMIN 4.3  --   --  3.9   --   --   --  3.7 3.4* 2.8*  CRP  --   --   --   --   --   --   --  4.4* 3.8* 17.2*  DDIMER  --   --   --   --  2.01*  --   --   --   --   --   PROCALCITON  --   --   --   --   --   --   --  0.36 0.33 0.55  INR  --   --   --  1.2  --   --   --   --   --   --   TSH  --   --  1.019  --   --   --   --   --   --   --   HGBA1C  --   --   --   --   --   --   --  6.3*  --   --   MG  --    < > 1.9 1.9  --  2.0 1.8  --  1.9 1.9  CALCIUM 9.6  --   --  9.3  --  9.6 9.0 9.8 9.0 8.8*   < > = values in this interval not displayed.    Lab Results  Component Value Date   CHOL 116 05/23/2023   HDL 65 05/23/2023   LDLCALC 39 05/23/2023   LDLDIRECT 163.3 05/26/2008   TRIG 59 05/23/2023   CHOLHDL 1.8 05/23/2023   Lab Results  Component Value Date   HGBA1C 6.3 (H) 05/26/2023      Recent Labs  Lab 05/22/23 2226 05/23/23 0450 05/24/23 0445 05/24/23 0654 05/25/23 1557 05/26/23 0931 05/27/23 0450 05/28/23 0329  CRP  --   --   --   --   --  4.4* 3.8* 17.2*  DDIMER  --   --  2.01*  --   --   --   --   --   PROCALCITON  --   --   --   --   --  0.36 0.33 0.55  INR  --  1.2  --   --   --   --   --   --   TSH 1.019  --   --   --   --   --   --   --   HGBA1C  --   --   --   --   --  6.3*  --   --   MG 1.9 1.9  --  2.0 1.8  --  1.9 1.9  CALCIUM  --  9.3  --  9.6 9.0 9.8 9.0 8.8*    RADIOLOGY STUDIES/RESULTS: DG Chest Port 1 View  Result Date: 05/28/2023 CLINICAL DATA:  Shortness of breath EXAM: PORTABLE CHEST 1 VIEW COMPARISON:  05/24/2023 FINDINGS: Normal heart size and mediastinal contours. Linear densities behind the heart with mild volume loss. No effusion or pneumothorax. No acute osseous findings. IMPRESSION: Unchanged infiltrate in the left lower lobe. Electronically Signed   By: Tiburcio Pea M.D.   On: 05/28/2023 07:19   CT HEAD WO CONTRAST ( )  Result Date: 05/27/2023 CLINICAL DATA:  Stroke follow-up EXAM: CT HEAD WITHOUT CONTRAST TECHNIQUE: Contiguous axial images were obtained  from the base of the skull through the vertex without intravenous contrast. RADIATION DOSE REDUCTION: This exam was performed according to the departmental dose-optimization program which includes automated exposure control, adjustment of the mA and/or kV according to patient size and/or use of iterative reconstruction technique. COMPARISON:  Brain MRI 05/23/2023 FINDINGS: Brain: Extensive chronic infarcts scattered along the bilateral cerebral convexities. Small to moderate acute infarct along the anterior right frontal cortex which is parasagittal and associated with high-density subarachnoid blood products that have mildly increased. Chronic infarcts in the posterior fossa especially affecting the cerebellum. Chronic small vessel infarcts affecting the basal ganglia. No hydrocephalus or shift. Vascular: No hyperdense vessel or unexpected calcification. Skull: Normal. Negative for fracture or focal lesion. Sinuses/Orbits: No acute finding. IMPRESSION: 1. Mild increase in subarachnoid hemorrhage at the patient's acute right frontal cortex infarct. 2. Numerous chronic infarcts Electronically Signed   By: Tiburcio Pea M.D.   On: 05/27/2023 10:25   CT CHEST ABDOMEN PELVIS W CONTRAST  Result Date: 05/26/2023 CLINICAL DATA:  Sepsis EXAM: CT CHEST, ABDOMEN, AND PELVIS WITH CONTRAST TECHNIQUE: Multidetector CT imaging of the chest, abdomen and pelvis was performed following the standard protocol during bolus administration of intravenous contrast. RADIATION DOSE REDUCTION: This exam was performed according to the departmental dose-optimization program which includes automated exposure control, adjustment of the mA and/or kV according to patient size and/or use of iterative reconstruction technique. CONTRAST:  75mL OMNIPAQUE IOHEXOL 350 MG/ML SOLN COMPARISON:  None Available. FINDINGS: CT CHEST FINDINGS Cardiovascular: Normal heart size. No pericardial effusion. Thoracic aorta is normal in course and caliber.  Scattered atherosclerotic calcifications of the aorta and coronary arteries. Central pulmonary vasculature is within normal limits. Mediastinum/Nodes: No enlarged mediastinal, hilar, or axillary lymph nodes. Thyroid gland, trachea, and esophagus demonstrate no significant findings. Lungs/Pleura: Mild emphysema. Patchy airspace consolidation within the dependent portion of the left lower lobe. Small focus of ground-glass opacity within the left upper lobe. No pleural effusion or pneumothorax. Musculoskeletal: No acute bony abnormality. Patchy increased sclerosis within the T12 vertebral body, nonspecific.  Incidental note of a simple appearing lipoma deep to the right pectoralis musculature superiorly measuring approximately 6.1 x 2.7 x 3.0 cm. CT ABDOMEN PELVIS FINDINGS Hepatobiliary: 9 mm low-density lesion in the right hepatic lobe with peripheral discontinuous enhancement that fills in on delayed phase sequences most compatible with a benign hemangioma. No suspicious hepatic abnormality. Unremarkable gallbladder. No hyperdense gallstone. No biliary dilatation. Pancreas: Unremarkable. No pancreatic ductal dilatation or surrounding inflammatory changes. Spleen: Normal in size without focal abnormality. Adrenals/Urinary Tract: Low attenuation thickening of the right adrenal gland without discrete nodule. Unremarkable left adrenal gland. Extensive wedge-shaped areas of poor enhancement of the right kidney. No perinephric fat stranding. No perfusion defect of the left kidney. No renal stone or hydronephrosis. Thick-walled urinary bladder which is partially decompressed by Foley catheter. Stomach/Bowel: Stomach is within normal limits. No evidence of bowel wall thickening, distention, or inflammatory changes. Vascular/Lymphatic: Nonocclusive, pedunculated filling defect in the mid abdominal aorta measuring approximately 2.4 x 1.1 cm in size which occludes the ostium of the right renal artery (series 3, image 67).  Scattered atherosclerotic calcification. No lymphadenopathy. Reproductive: Prostate is unremarkable. Other: No free fluid. No abdominopelvic fluid collection. No pneumoperitoneum. No abdominal wall hernia. Musculoskeletal: Advanced multilevel degenerative lumbar spondylosis. Dextrocurvature centered at the thoracolumbar junction. No appreciable lytic or sclerotic bone lesion. IMPRESSION: 1. 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 Assessment by vascular surgery or interventional radiology is recommended. 2. Extensive wedge-shaped areas of poor enhancement of the right kidney, compatible with right renal infarction. 3. Patchy airspace consolidation within the dependent portion of the left lower lobe, concerning for pneumonia. Small focus of ground-glass opacity within the left upper lobe, which may represent an additional focus of infection. 4. Thick-walled urinary bladder which is partially decompressed by Foley catheter. Correlate with urinalysis. 5. Patchy increased sclerosis within the T12 vertebral body, nonspecific. Consider further evaluation with nonemergent MRI of the thoracic spine. Aortic Atherosclerosis (ICD10-I70.0) and Emphysema (ICD10-J43.9). These results will be called to the ordering clinician or representative by the Radiologist Assistant, and communication documented in the PACS or [employer]. Electronically Signed   By: Duanne Guess D.O.   On: 05/26/2023 12:55     LOS: 6 days   Signature  -    Susa Raring M.D on 05/28/2023 at 8:02 AM   -  To page go to [email or web address]

## 2023-05-28 NOTE — Progress Notes (Signed)
Patient was off the floor for MRI. Patient's RN was okay not to put in 2nd PIV access due to D/C heparin. Canceled the IV consult at this time. HS [employer]

## 2023-05-28 NOTE — Discharge Instructions (Addendum)
Information on my medicine - ELIQUIS (apixaban)  This medication education was reviewed with me or my healthcare representative as part of my discharge preparation.  Why was Eliquis prescribed for you? Eliquis was prescribed to treat blood clots that may have been found in the veins of your legs (deep vein thrombosis) or in your lungs (pulmonary embolism) and to reduce the risk of them occurring again.  What do You need to know about Eliquis ? The dose is ONE 5 mg tablet taken TWICE daily.  Eliquis may be taken with or without food.   Try to take the dose about the same time in the morning and in the evening. If you have difficulty swallowing the tablet whole please discuss with your pharmacist how to take the medication safely.  Take Eliquis exactly as prescribed and DO NOT stop taking Eliquis without talking to the doctor who prescribed the medication.  Stopping may increase your risk of developing a new blood clot.  Refill your prescription before you run out.  After discharge, you should have regular check-up appointments with your healthcare provider that is prescribing your Eliquis.    What do you do if you miss a dose? If a dose of ELIQUIS is not taken at the scheduled time, take it as soon as possible on the same day and twice-daily administration should be resumed. The dose should not be doubled to make up for a missed dose.  Important Safety Information A possible side effect of Eliquis is bleeding. You should call your healthcare provider right away if you experience any of the following: ? Bleeding from an injury or your nose that does not stop. ? Unusual colored urine (red or dark brown) or unusual colored stools (red or black). ? Unusual bruising for unknown reasons. ? A serious fall or if you hit your head (even if there is no bleeding).  Some medicines may interact with Eliquis and might increase your risk of bleeding or clotting while on Eliquis. To help avoid  this, consult your healthcare provider or pharmacist prior to using any new prescription or non-prescription medications, including herbals, vitamins, non-steroidal anti-inflammatory drugs (NSAIDs) and supplements.  This website has more information on Eliquis (apixaban): http://www.eliquis.com/eliquis/home

## 2023-05-28 NOTE — Progress Notes (Addendum)
  Progress Note    05/28/2023 8:00 AM * No surgery found *  Subjective:  Patient is sleeping this morning.  His son states he has not complained about any further back pain.  He is however worried that his dad is not eating.   Vitals:   05/28/23 0400 05/28/23 0543  BP: (!) 150/105 (!) 146/82  Pulse: 98 91  Resp: 19 19  Temp: 98.2 F (36.8 C)   SpO2: 94% 98%   Physical Exam: Lungs:  non labored Extremities:  feet are warm Abdomen:  soft Neurologic: sleeping  CBC    Component Value Date/Time   WBC 22.1 (H) 05/28/2023 0329   RBC 4.83 05/28/2023 0329   HGB 12.9 (L) 05/28/2023 0329   HCT 38.6 (L) 05/28/2023 0329   PLT 218 05/28/2023 0329   MCV 79.9 (L) 05/28/2023 0329   MCH 26.7 05/28/2023 0329   MCHC 33.4 05/28/2023 0329   RDW 14.7 05/28/2023 0329   LYMPHSABS 0.9 05/28/2023 0329   MONOABS 1.3 (H) 05/28/2023 0329   EOSABS 0.0 05/28/2023 0329   BASOSABS 0.0 05/28/2023 0329    BMET    Component Value Date/Time   NA 136 05/28/2023 0329   K 3.1 (L) 05/28/2023 0329   CL 102 05/28/2023 0329   CO2 20 (L) 05/28/2023 0329   GLUCOSE 78 05/28/2023 0329   BUN 19 05/28/2023 0329   CREATININE 1.83 (H) 05/28/2023 0329   CALCIUM 8.8 (L) 05/28/2023 0329   GFRNONAA 36 (L) 05/28/2023 0329   GFRAA >60 11/08/2015 2151    INR    Component Value Date/Time   INR 1.2 05/23/2023 0450     Intake/Output Summary (Last 24 hours) at 05/28/2023 0800 Last data filed at 05/28/2023 0423 Gross per 24 hour  Intake 394.1 ml  Output 1750 ml  Net -1355.9 ml     Assessment/Plan:  82 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 I agree with the PA note as documented above.  Patient getting an MRI brain this a.m. when I rounded.  Son states he rested comfortably overnight.  Continue heparin for aortic thrombus with thrombosed right renal artery assuming his MRI does not show any worsening changes given his recent stroke.  Will repeat CTA when renal function stabilizes.  Does appear his creatinine has peaked and creatinine 1.92 ---> 1.83 today.  Again no role for revascularization of his right renal artery occlusion and I suspect his right kidney is infarcted based on the CT.  Cephus Shelling, MD [facility] [place] Office: [phone]

## 2023-05-28 NOTE — Progress Notes (Signed)
Speech Language Pathology Treatment: Dysphagia  Patient Details Name: Eric [name] MRN: [id number] DOB: [birth year] Today's Date: 05/28/2023 Time: 1530-1550 SLP Time Calculation (min) (ACUTE ONLY): 20 min  Assessment / Plan / Recommendation Clinical Impression  Pt seen by SLP for diet toleration check. Pt was in bed, with his eyes closed when SLP entered the room. He was responsive to SLP voice and cooperative, but he did not open his eyes at any point during the session. The patient's family remained in the room. Patient son stated the patient had not eaten since breakfast. The patient's son requested an increase in appetite stimulant, as he seemed to awake hungry in the morning after nocturnal administration. SLP attempted PO trials of thin liquid (Ensure) and pudding thick [employer]). Initially attempted to feed patient Ensure via straw, but the patient could not maintain an adequate seal on the straw. Via teaspoon, the patient did not initiate a swallow. Ensure was orally held, and eventually anteriorly lost, resulting in immediate coughing. SLP utilized suction. Magic Cup via teaspoon results were the same. SLP educated the patient's son on aspiration precautions. Decline in function can potentially be attributed to patient lethargy (son informed SLP he had seen OT and PT prior and had undergone an MRI). SLP will tentatively continue dys 2 and thin liquids, however, patient must be adequately awake and alert enough to attend to POs. ST will continue to follow for diet toleration.  HPI HPI: 82 y.o. male presents to [facility]2023 as a transfer from [facility] after a fall. CT head suggestive of acute to subacute L parietal infarct, as well as IPH in R frontal lobe. Pt also found to have C6 vertebral body fx. PMH includes CVA, HTN. Pt is mild-moderately dysarthric      SLP Plan  Continue with current plan of care      Recommendations for follow up therapy are one component of a  multi-disciplinary discharge planning process, led by the attending physician.  Recommendations may be updated based on patient status, additional functional criteria and insurance authorization.    Recommendations  Diet recommendations: Dysphagia 2 (fine chop);Thin liquid Liquids provided via: Straw;Cup Medication Administration: Whole meds with puree Supervision: Full supervision/cueing for compensatory strategies Compensations: Slow rate;Small sips/bites;Minimize environmental distractions Postural Changes and/or Swallow Maneuvers: Seated upright 90 degrees                  Oral care BID   Frequent or constant Supervision/Assistance Dysphagia, unspecified (R13.10)     Continue with current plan of care     Marline Backbone, Senaida Lange., Speech Therapy Student   05/28/2023, 4:35 PM

## 2023-05-28 NOTE — TOC Initial Note (Signed)
Transition of Care [facility]) - Initial/Assessment Note    Patient Details  Name: Eric [name] MRN: [id number] Date of Birth: 1940/11/10  Transition of Care [facility]) CM/SW Contact:    Mearl Latin, LCSW Phone Number: 05/28/2023, 1:52 PM  Clinical Narrative:                 TOC following for therapy recommendations.   Expected Discharge Plan: Skilled Nursing Facility Barriers to Discharge: Continued Medical Work up, English as a second language teacher, SNF Pending bed offer   Patient Goals and CMS Choice Patient states their goals for this hospitalization and ongoing recovery are:: Rehab CMS Medicare.gov Compare Post Acute Care list provided to:: Patient Represenative (must comment) Choice offered to / list presented to : Adult Children Mutual ownership interest in [facility].provided to:: Adult Children    Expected Discharge Plan and Services In-house Referral: Clinical Social Work     Living arrangements for the past 2 months: Single Family Home                                      Prior Living Arrangements/Services Living arrangements for the past 2 months: Single Family Home   Patient language and need for interpreter reviewed:: Yes Do you feel safe going back to the place where you live?: Yes      Need for Family Participation in Patient Care: Yes (Comment) Care giver support system in place?: Yes (comment)   Criminal Activity/Legal Involvement Pertinent to Current Situation/Hospitalization: No - Comment as needed  Activities of Daily Living      Permission Sought/Granted Permission sought to share information with : Facility Medical sales representative, Family Supports Permission granted to share information with : Yes, Verbal Permission Granted  Share Information with NAME: [name]  Permission granted to share info w AGENCY: SNFs  Permission granted to share info w Relationship: Daughter  Permission granted to share info w Contact Information:  [phone]  Emotional Assessment Appearance:: Appears stated age Attitude/Demeanor/Rapport: Unable to Assess Affect (typically observed): Unable to Assess Orientation: : Oriented to Self Alcohol / Substance Use: Not Applicable Psych Involvement: No (comment)  Admission diagnosis:  Acute ischemic stroke (HCC) [I63.9] Fall, initial encounter [W19.XXXA] Cerebrovascular accident (CVA), unspecified mechanism (HCC) [I63.9] Closed nondisplaced fracture of sixth cervical vertebra, unspecified fracture morphology, initial encounter (HCC) [S12.501A] Traumatic intracerebral hemorrhage with unknown loss of consciousness status, unspecified laterality, initial encounter (HCC) [S06.36AA] Patient Active Problem List   Diagnosis Date Noted   Acute encephalopathy 05/23/2023   C6 cervical fracture (HCC) 05/23/2023   Elevated troponin 05/23/2023   BPH (benign prostatic hyperplasia) 05/23/2023   Acute ischemic stroke (HCC) 05/22/2023   Confusion 03/13/2023   Abnormal CXR 02/23/2023   Subarachnoid bleed (HCC) 02/22/2023   CVA (cerebral vascular accident) (HCC) 02/22/2023   Anemia 01/02/2023   Restless legs syndrome (RLS) 04/24/2021   Abnormal ultrasound of bladder 12/25/2020   UTI (urinary tract infection) 09/25/2020   Cervical pain (neck) 05/15/2018   Abdominal bloating 05/15/2018   Arm pain, diffuse, left 12/31/2017   Well adult exam 07/30/2017   Vitamin D deficiency 12/18/2016   Osteoarthritis 12/18/2016   B12 deficiency 09/16/2016   Hypokalemia 08/16/2016   Dizziness and giddiness 11/15/2015   Blurred vision 11/15/2015   Headache 07/07/2013   Hyperlipemia 07/07/2013   Acute intracranial hemorrhage (HCC) 07/03/2013   Pain of both eyes 07/02/2013   Acute bronchitis 07/02/2013   ERECTILE DYSFUNCTION,  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 [place] [address] Phone: [phone] Fax: [phone]     Social Determinants of Health (SDOH) Social History: SDOH Screenings   Food Insecurity: No Food Insecurity (02/26/2023)  Housing: Low Risk  (02/22/2023)  Transportation Needs: No Transportation Needs (02/26/2023)  Utilities: Not At Risk (02/22/2023)  Depression (PHQ2-9): Low Risk  (04/17/2023)  Tobacco Use: Medium Risk (05/22/2023)   SDOH Interventions:     Readmission Risk Interventions     No data to display

## 2023-05-28 NOTE — Plan of Care (Signed)
  Problem: Health Behavior/Discharge Planning: Goal: Goals will be collaboratively established with patient/family Outcome: Progressing   Problem: Clinical Measurements: Goal: Ability to maintain clinical measurements within normal limits will improve Outcome: Progressing Goal: Diagnostic test results will improve Outcome: Progressing Goal: Respiratory complications will improve Outcome: Progressing

## 2023-05-28 NOTE — Consult Note (Signed)
Consultation Note Date: 05/28/2023   Patient Name: Eric [name]  DOB: Jan 27, 1941  MRN: [id number]  Age / Sex: 82 y.o., male  PCP: Plotnikov, Georgina Quint, MD Referring Physician: Leroy Sea, MD  Reason for Consultation: Establishing goals of care, "CVA, ? dementia, cachexia, FTT, now thrwing clots everywhere, ?? GOC "  HPI/Patient Profile: 82 y.o. male  with past medical history of stroke, essential pretension, who was admitted to [facility] on 05/22/2023 with suspected acute versus subacute ischemic stroke after presenting from home to [facility] ED for evaluation of fall.    Clinical Assessment and Goals of Care: I have reviewed medical records including EPIC notes, labs, and imaging. Received report from primary RN - no acute concerns. RN reports patient is awake but not oriented and oral intake is minimal, though did eat a little more today after starting Remeron yesterday.   Went to visit patient at bedside - son/[name] present. Patient was lying in bed asleep - he does not wake to voice/gentle touch. No signs or non-verbal gestures of pain or discomfort noted. No respiratory distress, increased work of breathing, or secretions noted. C-collar in use.   Met with son/[name]  to discuss diagnosis, prognosis, GOC, EOL wishes, disposition, and options.  I introduced Palliative Medicine as specialized medical care for people living with serious illness. It focuses on providing relief from the symptoms and stress of a serious illness. The goal is to improve quality of life for both the patient and the family.  We discussed a brief life review of the patient as well as functional and nutritional status. Patient had a recent stroke in June of this year - he has progressively declined since this time. [name] tells me he cooks, cleans, does yard work, takes patient to appointments/to the hospital.  Therapeutic listening provided as he reflects on his caregiver role. [name] tells me that his sister is "the one that usually handles the details." He would prefer to complete GOC with his mother, sister and possibly other family members present, which is recommended.   Tentative family meeting scheduled for tomorrow at 3:45pm.  [name] understands family are faced with treatment option decisions. He also understands that discussions on anticipatory care needs at this stage will be important. He is glad to be able to discuss these topics. He recognizes the patient is not eating enough to sustain himself long term.  Questions and concerns were addressed. The patient/family was encouraged to call with questions and/or concerns. PMT card was provided.   Primary Decision Maker: NEXT OF KIN - legally would be spouse/Nellie Illescas    SUMMARY OF RECOMMENDATIONS   Continue to treat the treatable at this time Continue full code as previously documented Family meeting tentatively scheduled for tomorrow 9/26 at 3:45p for full GOC PMT will continue to follow and support holistically   Code Status/Advance Care Planning: Full code  Palliative Prophylaxis:  Aspiration, Delirium Protocol, Frequent Pain Assessment, Oral Care, and Turn Reposition  Additional Recommendations (Limitations, Scope, Preferences): Full Scope Treatment  Psycho-social/Spiritual:  Desire for further Chaplaincy support:no Created space and opportunity for patient and family to express thoughts and feelings regarding patient's current medical situation.  Emotional support and therapeutic listening provided.  Prognosis:  Poor long term  Discharge Planning: To Be Determined      Primary Diagnoses: Present on Admission:  Acute ischemic stroke (HCC)  Acute encephalopathy  C6 cervical fracture (HCC)  Elevated troponin  BPH (benign prostatic hyperplasia)  Essential hypertension  I have reviewed the medical  record, interviewed the patient and family, and examined the patient. The following aspects are pertinent.  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   Hypertension    Stroke (HCC) 06/2014   Social History   Socioeconomic History   Marital status: Married    Spouse name: Nellie   Number of children: 6   Years of education: 10th   Highest education level: Not on file  Occupational History   Occupation: retired  Tobacco Use   Smoking status: Former    Current packs/day: 0.00    Types: Cigarettes    Quit date: 09/22/1999    Years since quitting: 23.6   Smokeless tobacco: Never  Substance and Sexual Activity   Alcohol use: No    Alcohol/week: 0.0 standard drinks of alcohol   Drug use: No   Sexual activity: Yes  Other Topics Concern   Not on file  Social History Narrative   Patient lives at home with wife, consumes no caffeine         Social Determinants of Health   Financial Resource Strain: Not on file  Food Insecurity: No Food Insecurity (02/26/2023)   Hunger Vital Sign    Worried About Running Out of Food in the Last Year: Never true    Ran Out of Food in the Last Year: Never true  Transportation Needs: No Transportation Needs (02/26/2023)   PRAPARE - Administrator, Civil Service (Medical): No    Lack of Transportation (Non-Medical): No  Physical Activity: Not on file  Stress: Not on file  Social Connections: Not on file   Family History  Problem Relation Age of Onset   Arthritis Mother    Diabetes Father    Diabetes Other    Hypertension Other    Scheduled Meds:  apixaban  5 mg Oral BID   Chlorhexidine Gluconate Cloth  6 each Topical Daily   finasteride  5 mg Oral Daily   lidocaine  1 Application Urethral Once   mirtazapine  15 mg Oral  QHS   rosuvastatin  40 mg Oral Daily   tamsulosin  0.4 mg Oral Daily   Continuous Infusions:  ampicillin-sulbactam (UNASYN) IV Stopped (05/28/23 0337)   lactated ringers 75 mL/hr at 05/28/23 1039   PRN Meds:.acetaminophen **OR** acetaminophen, hydrALAZINE, labetalol, melatonin, morphine injection, ondansetron (ZOFRAN) IV, polyethylene glycol Medications Prior to Admission:  Prior to Admission medications   Medication Sig Start Date End Date Taking? Authorizing Provider  acetaminophen (TYLENOL) 650 MG CR tablet Take 650 mg by mouth at bedtime as needed (for back pain). For arthritis in back   Yes [provider]  amLODipine-benazepril (LOTREL) 5-20 MG capsule TAKE 1 CAPSULE BY MOUTH EVERY DAY Patient taking differently: Take 1 capsule by mouth at bedtime. 10/28/22  Yes Plotnikov, Georgina Quint, MD  BAYER LOW DOSE 81 MG tablet Take 81 mg by mouth in the morning. Swallow whole.   Yes [provider]  Cyanocobalamin (VITAMIN B-12) 1000 MCG SUBL Place 1 tablet (1,000 mcg total) under the tongue daily. Patient taking differently: Place 1 tablet under the tongue at bedtime. 04/24/21  Yes Plotnikov, Georgina Quint, MD  ferrous sulfate 325 (65 FE) MG tablet Take 1 tablet (325 mg  total) by mouth 2 (two) times daily with a meal. Patient taking differently: Take 325 mg by mouth at bedtime. 12/29/22 12/29/23 Yes Plotnikov, Georgina Quint, MD  finasteride (PROSCAR) 5 MG tablet Take 1 tablet (5 mg total) by mouth daily. 06/26/22  Yes Plotnikov, Georgina Quint, MD  oxybutynin (DITROPAN-XL) 5 MG 24 hr tablet TAKE 1 TABLET (5 MG TOTAL) BY MOUTH AT BEDTIME. FOR FREQUENT URINATION 05/14/23  Yes Plotnikov, Georgina Quint, MD  potassium chloride SA (KLOR-CON M) 20 MEQ tablet Take 1 tablet (20 mEq total) by mouth daily. 04/18/23  Yes Plotnikov, Georgina Quint, MD  rosuvastatin (CRESTOR) 20 MG tablet Take 1 tablet (20 mg total) by mouth daily. 02/25/23  Yes Rai, Ripudeep K, MD  traMADol (ULTRAM) 50 MG tablet TAKE 1 TABLET 3 TIMES A  DAY AS NEEDED SEVERE PAIN Patient taking differently: Take 50 mg by mouth 3 (three) times daily. 02/14/23  Yes Plotnikov, Georgina Quint, MD  aspirin EC 81 MG tablet Take 1 tablet (81 mg total) by mouth daily. Swallow whole. Patient not taking: Reported on 05/22/2023 02/25/23   Rai, Delene Ruffini, MD  pantoprazole (PROTONIX) 40 MG tablet Take 1 tablet (40 mg total) by mouth daily. 02/24/23   Cathren Harsh, MD   Allergies  Allergen Reactions   Hctz [Hydrochlorothiazide] Other (See Comments)    "Peeing too much"   Review of Systems  Unable to perform ROS: Acuity of condition    Physical Exam Vitals and nursing note reviewed.  Constitutional:      General: He is not in acute distress.    Appearance: He is cachectic. He is ill-appearing.  Pulmonary:     Effort: No respiratory distress.  Skin:    General: Skin is warm and dry.  Neurological:     Comments: asleep     Vital Signs: BP (!) 170/93   Pulse 80   Temp 98.3 F (36.8 C) (Oral)   Resp 11   Ht 5\' 8"  (1.727 m)   Wt 59.7 kg   SpO2 98%   BMI 20.01 kg/m  Pain Scale: 0-10   Pain Score: 0-No pain   SpO2: SpO2: 98 % O2 Device:SpO2: 98 % O2 Flow Rate: .O2 Flow Rate (L/min): 2 L/min  IO: Intake/output summary:  Intake/Output Summary (Last 24 hours) at 05/28/2023 1300 Last data filed at 05/28/2023 1610 Gross per 24 hour  Intake 811.9 ml  Output 1200 ml  Net -388.1 ml    LBM: Last BM Date : 05/25/23 Baseline Weight: Weight: 61 kg Most recent weight: Weight: 59.7 kg     Palliative Assessment/Data:PPS 20%     Time In: 1200 Time Out: 1300 Time Total: 60 minutes  Greater than 50%  of this time was spent counseling and coordinating care related to the above assessment and plan.  Signed by: Haskel Khan, NP   Please contact Palliative Medicine Team phone at [phone] for questions and concerns.  For individual provider: See Amion  *Portions of this note are a verbal dictation therefore any spelling and/or  grammatical errors are due to the "Dragon Medical One" system interpretation.

## 2023-05-29 DIAGNOSIS — W19XXXA Unspecified fall, initial encounter: Secondary | ICD-10-CM | POA: Diagnosis not present

## 2023-05-29 DIAGNOSIS — S0636AA Traumatic hemorrhage of cerebrum, unspecified, with loss of consciousness status unknown, initial encounter: Secondary | ICD-10-CM | POA: Diagnosis not present

## 2023-05-29 DIAGNOSIS — I639 Cerebral infarction, unspecified: Secondary | ICD-10-CM | POA: Diagnosis not present

## 2023-05-29 DIAGNOSIS — S12501A Unspecified nondisplaced fracture of sixth cervical vertebra, initial encounter for closed fracture: Secondary | ICD-10-CM | POA: Diagnosis not present

## 2023-05-29 LAB — CBC WITH DIFFERENTIAL/PLATELET
Abs Immature Granulocytes: 0.12 10*3/uL — ABNORMAL HIGH (ref 0.00–0.07)
Basophils Absolute: 0 10*3/uL (ref 0.0–0.1)
Basophils Relative: 0 %
Eosinophils Absolute: 0.1 10*3/uL (ref 0.0–0.5)
Eosinophils Relative: 0 %
HCT: 40.6 % (ref 39.0–52.0)
Hemoglobin: 13.9 g/dL (ref 13.0–17.0)
Immature Granulocytes: 1 %
Lymphocytes Relative: 4 %
Lymphs Abs: 0.9 10*3/uL (ref 0.7–4.0)
MCH: 27.1 pg (ref 26.0–34.0)
MCHC: 34.2 g/dL (ref 30.0–36.0)
MCV: 79.1 fL — ABNORMAL LOW (ref 80.0–100.0)
Monocytes Absolute: 1.6 10*3/uL — ABNORMAL HIGH (ref 0.1–1.0)
Monocytes Relative: 7 %
Neutro Abs: 20.3 10*3/uL — ABNORMAL HIGH (ref 1.7–7.7)
Neutrophils Relative %: 88 %
Platelets: 215 10*3/uL (ref 150–400)
RBC: 5.13 MIL/uL (ref 4.22–5.81)
RDW: 14.8 % (ref 11.5–15.5)
WBC: 22.9 10*3/uL — ABNORMAL HIGH (ref 4.0–10.5)
nRBC: 0 % (ref 0.0–0.2)

## 2023-05-29 LAB — CULTURE, BLOOD (ROUTINE X 2)

## 2023-05-29 LAB — COMPREHENSIVE METABOLIC PANEL
ALT: 71 U/L — ABNORMAL HIGH (ref 0–44)
AST: 75 U/L — ABNORMAL HIGH (ref 15–41)
Albumin: 2.6 g/dL — ABNORMAL LOW (ref 3.5–5.0)
Alkaline Phosphatase: 57 U/L (ref 38–126)
Anion gap: 18 — ABNORMAL HIGH (ref 5–15)
BUN: 16 mg/dL (ref 8–23)
CO2: 17 mmol/L — ABNORMAL LOW (ref 22–32)
Calcium: 8.8 mg/dL — ABNORMAL LOW (ref 8.9–10.3)
Chloride: 101 mmol/L (ref 98–111)
Creatinine, Ser: 1.97 mg/dL — ABNORMAL HIGH (ref 0.61–1.24)
GFR, Estimated: 33 mL/min — ABNORMAL LOW (ref >60)
Glucose, Bld: 98 mg/dL (ref 70–99)
Potassium: 3.6 mmol/L (ref 3.5–5.1)
Sodium: 136 mmol/L (ref 135–145)
Total Bilirubin: 1.3 mg/dL — ABNORMAL HIGH (ref 0.3–1.2)
Total Protein: 6.2 g/dL — ABNORMAL LOW (ref 6.5–8.1)

## 2023-05-29 LAB — MAGNESIUM: Magnesium: 1.9 mg/dL (ref 1.7–2.4)

## 2023-05-29 LAB — PROCALCITONIN: Procalcitonin: 0.64 ng/mL

## 2023-05-29 LAB — C-REACTIVE PROTEIN: CRP: 24 mg/dL — ABNORMAL HIGH (ref <1.0)

## 2023-05-29 NOTE — Progress Notes (Addendum)
  Progress Note    05/29/2023 8:37 AM * No surgery found *  Subjective: Sleeping.  Son is at the bedside   Vitals:   05/29/23 0000 05/29/23 0400  BP: 118/72 (!) 149/77  Pulse: 94 (!) 101  Resp: 15 20  Temp: 98 F (36.7 C) 98 F (36.7 C)  SpO2: 95% 96%   Physical Exam: Lungs:  non labored Extremities:  feet are warm Abdomen:  soft, NT, ND Neurologic: A&O  CBC    Component Value Date/Time   WBC 22.9 (H) 05/29/2023 0624   RBC 5.13 05/29/2023 0624   HGB 13.9 05/29/2023 0624   HCT 40.6 05/29/2023 0624   PLT 215 05/29/2023 0624   MCV 79.1 (L) 05/29/2023 0624   MCH 27.1 05/29/2023 0624   MCHC 34.2 05/29/2023 0624   RDW 14.8 05/29/2023 0624   LYMPHSABS 0.9 05/29/2023 0624   MONOABS 1.6 (H) 05/29/2023 0624   EOSABS 0.1 05/29/2023 0624   BASOSABS 0.0 05/29/2023 0624    BMET    Component Value Date/Time   NA 136 05/29/2023 0624   K 3.6 05/29/2023 0624   CL 101 05/29/2023 0624   CO2 17 (L) 05/29/2023 0624   GLUCOSE 98 05/29/2023 0624   BUN 16 05/29/2023 0624   CREATININE 1.97 (H) 05/29/2023 0624   CALCIUM 8.8 (L) 05/29/2023 0624   GFRNONAA 33 (L) 05/29/2023 0624   GFRAA >60 11/08/2015 2151    INR    Component Value Date/Time   INR 1.2 05/23/2023 0450     Intake/Output Summary (Last 24 hours) at 05/29/2023 0837 Last data filed at 05/29/2023 0300 Gross per 24 hour  Intake 1170.29 ml  Output 1100 ml  Net 70.29 ml     Assessment/Plan:  82 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  Feet are still warm and well-perfused on exam.  Creatinine 1.97 this morning.  We will hold off on repeat CTA until renal function stabilizes/improves.  No role for thrombectomy of right renal artery at this time.  Plans noted for goals of care discussion with palliative care and family later today.    Emilie Rutter, PA-C [facility] [phone] 05/29/2023 8:37 AM  I have seen and  evaluated the patient. I agree with the PA note as documented above.  Agree with palliative care discussion for goals of care.  Looks quite frail as documented by Dr. Thedore Mins.  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  Will delay repeat CTA given his renal function remains elevated.  Bilateral femoral pulses and Doppler signals in his feet and remain well-perfused.  No significant lower extremity complaints.  No significant abdominal pain.  Again no role for revascularization of his right renal given the right kidney looks ischemic on CTA and this has been ongoing for almost a week.  Cephus Shelling, MD [facility] [place] Office: [phone]

## 2023-05-29 NOTE — Progress Notes (Signed)
Daily Progress Note   Patient Name: Eric [name]       Date: 05/29/2023 DOB: Feb 16, 1941  Age: 82 y.o. MRN#: [id number] Attending Physician: Leroy Sea, MD Primary Care Physician: Tresa Garter, MD Admit Date: 05/22/2023  Reason for Consultation/Follow-up: Establishing goals of care  Subjective: I have reviewed medical records including EPIC notes, MAR, and labs. Noted increase in creatinine today. Received report from primary RN - no acute concerns. RN reports patient continues with minimal oral intake; is taking medications.  11:07 AM Attempted to call son/Eric [name] to confirm meeting this afternoon - no answer - confidential VM left with request to return call/confirm meeting.  Went to visit patient at bedside - son/Eric [name] present. Eric [name] indicates his sister/Eric [name] should be reaching out to PMT to schedule the family meeting. Patient was lying in bed asleep - he does not wake to voice. No signs or non-verbal gestures of pain or discomfort noted. No respiratory distress, increased work of breathing, or secretions noted. He is ill and frail appearing. C collar in use.  11:20 AM Called daughter/Eric [name] - emotional support provided. She tells me she is a Runner, broadcasting/film/video and is not able to meet until Monday. Encouraged family to meet sooner than later to discuss goals as it will be important to discuss advanced care planning and for medical team to know how to proceed with care. Education provided that PMT is available over the weekend - meeting scheduled for Saturday at 3p.  All questions and concerns addressed. Encouraged to call with questions and/or concerns. PMT card previously provided.  Length of Stay: 7  Current Medications: Scheduled Meds:   apixaban  5 mg Oral BID    Chlorhexidine Gluconate Cloth  6 each Topical Daily   finasteride  5 mg Oral Daily   lidocaine  1 Application Urethral Once   mirtazapine  15 mg Oral QHS   rosuvastatin  40 mg Oral Daily   tamsulosin  0.4 mg Oral Daily    Continuous Infusions:  ampicillin-sulbactam (UNASYN) IV 3 g (05/29/23 0312)    PRN Meds: acetaminophen **OR** acetaminophen, hydrALAZINE, labetalol, melatonin, morphine injection, ondansetron (ZOFRAN) IV, polyethylene glycol  Physical Exam Vitals and nursing note reviewed.  Constitutional:      General: He is not in acute distress.    Appearance: He is ill-appearing.  Pulmonary:  Effort: No respiratory distress.  Skin:    General: Skin is warm and dry.  Neurological:     Comments: asleep             Vital Signs: BP (!) 163/89   Pulse (!) 105   Temp 99 F (37.2 C) (Oral)   Resp 20   Ht 5\' 8"  (1.727 m)   Wt 59.7 kg   SpO2 98%   BMI 20.01 kg/m  SpO2: SpO2: 98 % O2 Device: O2 Device: Room Air O2 Flow Rate: O2 Flow Rate (L/min): 2 L/min  Intake/output summary:  Intake/Output Summary (Last 24 hours) at 05/29/2023 1107 Last data filed at 05/29/2023 0300 Gross per 24 hour  Intake 1170.29 ml  Output 1100 ml  Net 70.29 ml   LBM: Last BM Date : 05/26/23 Baseline Weight: Weight: 61 kg Most recent weight: Weight: 59.7 kg       Palliative Assessment/Data: PPS 20%      Patient Active Problem List   Diagnosis Date Noted   Acute encephalopathy 05/23/2023   C6 cervical fracture (HCC) 05/23/2023   Elevated troponin 05/23/2023   BPH (benign prostatic hyperplasia) 05/23/2023   Acute ischemic stroke (HCC) 05/22/2023   Confusion 03/13/2023   Abnormal CXR 02/23/2023   Subarachnoid bleed (HCC) 02/22/2023   CVA (cerebral vascular accident) (HCC) 02/22/2023   Anemia 01/02/2023   Restless legs syndrome (RLS) 04/24/2021   Abnormal ultrasound of bladder 12/25/2020   UTI (urinary tract infection) 09/25/2020   Cervical pain (neck) 05/15/2018    Abdominal bloating 05/15/2018   Arm pain, diffuse, left 12/31/2017   Well adult exam 07/30/2017   Vitamin D deficiency 12/18/2016   Osteoarthritis 12/18/2016   B12 deficiency 09/16/2016   Hypokalemia 08/16/2016   Dizziness and giddiness 11/15/2015   Blurred vision 11/15/2015   Headache 07/07/2013   Hyperlipemia 07/07/2013   Acute intracranial hemorrhage (HCC) 07/03/2013   Pain of both eyes 07/02/2013   Acute bronchitis 07/02/2013   ERECTILE DYSFUNCTION, 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 male  with past medical history of stroke, essential pretension, who was admitted to [facility] on 05/22/2023 with suspected acute versus subacute ischemic stroke after presenting from home to [facility] ED for evaluation of fall.   Assessment: Principal Problem:   Acute ischemic stroke [facility]) Active Problems:   Essential hypertension   Acute intracranial hemorrhage (HCC)   Acute encephalopathy   C6 cervical fracture (HCC)   Elevated troponin   BPH (benign prostatic hyperplasia)   Concern about end of life  Recommendations/Plan: Family meeting rescheduled for Saturday 9/28 at 3pm for GOC PMT will continue to follow and support holistically  Goals of Care and Additional Recommendations: Limitations on Scope of Treatment: Full Scope Treatment  Code Status:    Code Status Orders  (From admission, onward)           Start     Ordered   05/22/23 2315  Full code  Continuous       Question:  By:  Answer:  Consent: discussion documented in EHR   05/22/23 2314           Code Status History     Date Active Date Inactive Code Status Order ID Comments User Context   02/22/2023 2231 02/24/2023 1819 Full Code [id number]  Therisa Doyne, MD ED  Prognosis:  If continues with minimal oral intake, poor  Discharge  Planning: To Be Determined  Care plan was discussed with primary RN, son, daughter, Dr. Thedore Mins  Thank you for allowing the Palliative Medicine Team to assist in the care of this patient.   Total Time 30 minutes Prolonged Time Billed  no       Greater than 50%  of this time was spent counseling and coordinating care related to the above assessment and plan.  Haskel Khan, NP  Please contact Palliative Medicine Team phone at [phone] for questions and concerns.   *Portions of this note are a verbal dictation therefore any spelling and/or grammatical errors are due to the "Dragon Medical One" system interpretation.

## 2023-05-29 NOTE — Plan of Care (Signed)
  Problem: Education: Goal: Knowledge of disease or condition will improve Outcome: Progressing   Problem: Ischemic Stroke/TIA Tissue Perfusion: Goal: Complications of ischemic stroke/TIA will be minimized Outcome: Progressing   Problem: Health Behavior/Discharge Planning: Goal: Goals will be collaboratively established with patient/family Outcome: Progressing

## 2023-05-29 NOTE — Progress Notes (Signed)
Speech Language Pathology Treatment: Dysphagia  Patient Details Name: Eric [name] MRN: [id number] DOB: [birth year] Today's Date: 05/29/2023 Time: 1650-1700 SLP Time Calculation (min) (ACUTE ONLY): 10 min  Assessment / Plan / Recommendation Clinical Impression  Patient seen by SLP for skilled treatment focused on dysphagia goals. Patient was lying in bed,eyes closed and family members in room. As patient was not alert for safe PO intake, session focused on family education. Family was asking if patient could have some soup. SLP explained that patient can have any liquid PO's (he is on Dys 2 (minced), thin liquids diet) but his biggest barrier to PO intake has been his lethargy. Family reported that he has mainly been consuming liquids when he is awake. Patient's son asked about most recent administration of his appetite stimulant and questioned why the medication was scheduled for night time and not during the day. SLP encouraged family to discuss medical care and medication questions with patient's RN. SLP provided patient with Boost supplement drink for family to give him when he is awake. Family appreciative of education and recommendations. SLP will continue to follow, however progress has been minimal due to lethargy.    HPI HPI: 82 y.o. male presents to [facility] hospital on 05/23/2023 as a transfer from [facility] after a fall. CT head suggestive of acute to subacute L parietal infarct, as well as IPH in R frontal lobe. Pt also found to have C6 vertebral body fx. PMH includes CVA, HTN. Pt is mild-moderately dysarthric      SLP Plan  Continue with current plan of care      Recommendations for follow up therapy are one component of a multi-disciplinary discharge planning process, led by the attending physician.  Recommendations may be updated based on patient status, additional functional criteria and insurance authorization.    Recommendations  Diet recommendations: Dysphagia 2 (fine  chop);Thin liquid Liquids provided via: Straw;Cup Medication Administration: Whole meds with puree Supervision: Full supervision/cueing for compensatory strategies Compensations: Slow rate;Small sips/bites;Minimize environmental distractions Postural Changes and/or Swallow Maneuvers: Seated upright 90 degrees                  Oral care BID   Frequent or constant Supervision/Assistance Dysphagia, unspecified (R13.10)     Continue with current plan of care    Angela Nevin, MA, CCC-SLP Speech Therapy

## 2023-05-29 NOTE — Progress Notes (Signed)
PROGRESS NOTE        PATIENT DETAILS Name: Eric [name] Age: 82 y.o. Sex: male Date of Birth: 12-11-1940 Admit Date: 05/22/2023 Admitting Physician Angie Fava, DO MWU:[id number], Georgina Quint, MD  Brief Summary: Patient is a 82 y.o.  male with history of CVA, HTN-who sustained a mechanical fall at home-upon further evaluation-was found to have acute CVA and C6 vertebral body fracture.  Patient was initially evaluated at [facility] and transferred to [facility] service at [facility] for further evaluation  Significant events: 9/19>> admit to [facility]  Significant studies: 6/23>> A1c: 5.8 9/19>> CT head: 0.9 cm focus of intraparenchymal hemorrhage right frontal lobe.  Acute to subacute cortical infarct left parietal lobe. 9/19 >>CT C-spine: Acute nondisplaced fracture through the anterior inferior corner of C6 vertebral body. 9/20>> CT head: Stable 1 cm anterior right middle frontal gyrus hemorrhagic contusion versus trace subarachnoid blood. 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 9/20>> MRI brain: Acute infarct anterior right frontal lobe. 9/20>> echo: EF 55-60% 9/20>> LDL: 112 05/25/2023.  Foley catheter placed by urology 05/26/23 >> CT - 1. 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 Assessment by vascular surgery or interventional radiology is recommended. 2. Extensive wedge-shaped areas of poor enhancement of the right kidney, compatible with right renal infarction. 3. Patchy airspace consolidation within the dependent portion of the left lower lobe, concerning for pneumonia. Small focus of ground-glass opacity within the left upper lobe, which may represent an additional focus of infection. 4.  Thick-walled urinary bladder which is partially decompressed by Foley catheter. Correlate with urinalysis. 5. Patchy increased sclerosis within the T12 vertebral body, nonspecific. Consider further evaluation with nonemergent MRI of the thoracic spine. Aortic Atherosclerosis (ICD10-I70.0) and Emphysema (ICD10-J43.9).  05/27/23 - Ct head - stable, reviewed with neurologist Dr. Pearlean Brownie.   Significant microbiology data: None  Procedures: None  Consults: Neurology, urology, Pall care  Subjective: Patient in bed, appears comfortable, denies any headache, no fever, no chest pain or pressure, no shortness of breath , no abdominal pain. No new focal weakness.  Objective: Vitals: Blood pressure (!) 149/77, pulse (!) 101, temperature 98 F (36.7 C), temperature source Axillary, resp. rate 20, height 5\' 8"  (1.727 m), weight 59.7 kg, SpO2 96%.   Exam:  Frail elderly AA male, Awake Alert, No new F.N deficits, soft c-collar in place, Foley catheter in place [place].AT,PERRAL Supple Neck, No JVD,   Symmetrical Chest wall movement, Good air movement bilaterally, CTAB RRR,No Gallops, Rubs or new Murmurs,  +ve B.Sounds, Abd Soft,   No Cyanosis, Clubbing or edema    Assessment/Plan:  Acute right frontal lobe CVA some questionable hemorrhagic contusion on CT not confirmed on MRI.  Neurology on board, currently on aspirin and statin, stable LDL, echo and recent A1c, had mild left-sided weakness and dysarthria.  Full stroke workup done, defer further recommendations to the neurology team.   C6 vertebral body fracture  CT and MRI although limited reviewed, seen by neurosurgery, hard c-collar removed by neurosurgery on 05/24/2022, PT OT, has subjectives comfort hence placed a soft c-collar on 05/27/2023.  HTN more than 4 days out of CVA gradually reintroduce blood pressure  medications  HLD  Resume Crestor-increase to 40 mg daily  BPH - Proscar but developed urinary retention, urology was called Foley  catheter was placed by Dr. Alvester Morin on 05/25/2023.   Nonspecific abdominal pain and sweats morning of 05/26/2023.  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 Pearlean Brownie.  He was placed on heparin drip eventually on Eliquis, clinically better.  Head CT repeated to monitor the hemorrhagic conversion's.  Discussed with patient's son and daughter in detail.  May benefit from TEE once he is more stronger in the future.  Patchy increased sclerosis within the T12 vertebral body, nonspecific. Outpatient further evaluation with nonemergent MRI of the thoracic spine.  Possible aspiration pneumonia.  Unasyn and monitor.  Speech following.  Hypokalemia.  Replace.  Overall cachexia, failure to thrive, poor oral intake ongoing for several months, Remeron added for appetite stimulation, palliative care for goals of care for long-term, family meeting scheduled for 05/29/2023 with palliative care.    Underweight: Estimated body mass index is 20.01 kg/m as calculated from the following:   Height as of this encounter: 5\' 8"  (1.727 m).   Weight as of this encounter: 59.7 kg.   Code status:   Code Status: Full Code   DVT Prophylaxis: SCDs Start: 05/22/23 2315 apixaban (ELIQUIS) tablet 5 mg   Family Communication: Son and wife at bedside 05/25/2023, son bedside 05/26/23, 05/27/2023, daughter [name] [phone] 05/26/23, 05/27/2023, son bedside 05/28/2023, 05/29/2023   Disposition Plan: Status is: Inpatient Remains inpatient appropriate because: Severity of illness   Planned Discharge Destination:Home health   Diet: Diet Order             DIET DYS 2 Room service appropriate? No; Fluid consistency: Thin  Diet effective now                     Antimicrobial agents: Anti-infectives (From admission, onward)    Start     Dose/Rate Route Frequency Ordered Stop   05/26/23 1430  Ampicillin-Sulbactam  (UNASYN) 3 g in sodium chloride 0.9 % 100 mL IVPB        3 g 200 mL/hr over 30 Minutes Intravenous Every 12 hours 05/26/23 1342          MEDICATIONS: Scheduled Meds:  apixaban  5 mg Oral BID   Chlorhexidine Gluconate Cloth  6 each Topical Daily   finasteride  5 mg Oral Daily   lidocaine  1 Application Urethral Once   mirtazapine  15 mg Oral QHS   rosuvastatin  40 mg Oral Daily   tamsulosin  0.4 mg Oral Daily   Continuous Infusions:  ampicillin-sulbactam (UNASYN) IV 3 g (05/29/23 0312)    PRN Meds:.acetaminophen **OR** acetaminophen, hydrALAZINE, labetalol, melatonin, morphine injection, ondansetron (ZOFRAN) IV, polyethylene glycol   I have personally reviewed following labs and imaging studies  LABORATORY DATA:  Recent Labs  Lab 05/23/23 0450 05/24/23 0654 05/25/23 1557 05/26/23 0931 05/27/23 0450 05/28/23 0329 05/29/23 0624  WBC 12.8*   < > 13.6* 18.7* 22.8* 22.1* 22.9*  HGB 13.4   < > 14.1 14.6 14.4 12.9* 13.9  HCT 39.1   < > 40.9 43.9 41.5 38.6* 40.6  PLT 264   < > 295 271 264 218 215  MCV 79.5*   < > 80.7 80.8 81.1 79.9* 79.1*  MCH 27.2   < > 27.8 26.9 28.1 26.7 27.1  MCHC 34.3   < > 34.5 33.3 34.7 33.4 34.2  RDW 15.0   < > 15.0 14.9 14.9 14.7 14.8  LYMPHSABS 1.8  --   --  0.9 0.8 0.9 0.9  MONOABS 1.1*  --   --  1.1* 1.4* 1.3* 1.6*  EOSABS 0.0  --   --  0.0 0.1 0.0 0.1  BASOSABS 0.1  --   --  0.0 0.0 0.0 0.0   < > = values in this interval not displayed.    Recent Labs  Lab 05/22/23 2226 05/23/23 0450 05/24/23 0445 05/24/23 0654 05/25/23 1557 05/26/23 0931 05/27/23 0450 05/28/23 0329 05/29/23 0624  NA  --  137  --  138 136 138 135 136 136  K  --  3.2*  --  3.6 4.0 4.4 3.6 3.1* 3.6  CL  --  104  --  107 104 104 103 102 101  CO2  --  21*  --  20* 18* 17* 19* 20* 17*  ANIONGAP  --  12  --  11 14 17* 13 14 18*  GLUCOSE  --  123*  --  109* 125* 94 96 78 98  BUN  --  9  --  10 20 24* 24* 19 16  CREATININE  --  0.99  --  1.22 1.46* 1.88* 1.92* 1.83*  1.97*  AST  --  33  --   --   --  53* 87* 71* 75*  ALT  --  27  --   --   --  36 59* 56* 71*  ALKPHOS  --  57  --   --   --  54 53 53 57  BILITOT  --  1.7*  --   --   --  1.3* 0.9 0.9 1.3*  ALBUMIN  --  3.9  --   --   --  3.7 3.4* 2.8* 2.6*  CRP  --   --   --   --   --  4.4* 3.8* 17.2* 24.0*  DDIMER  --   --  2.01*  --   --   --   --   --   --   PROCALCITON  --   --   --   --   --  0.36 0.33 0.55  --   INR  --  1.2  --   --   --   --   --   --   --   TSH 1.019  --   --   --   --   --   --   --   --   HGBA1C  --   --   --   --   --  6.3*  --   --   --   MG 1.9 1.9  --  2.0 1.8  --  1.9 1.9 1.9  CALCIUM  --  9.3  --  9.6 9.0 9.8 9.0 8.8* 8.8*    Lab Results  Component Value Date   CHOL 116 05/23/2023   HDL 65 05/23/2023   LDLCALC 39 05/23/2023   LDLDIRECT 163.3 05/26/2008   TRIG 59 05/23/2023   CHOLHDL 1.8 05/23/2023   Lab Results  Component Value Date   HGBA1C 6.3 (H) 05/26/2023      Recent Labs  Lab 05/22/23 2226 05/23/23 0450 05/24/23 0445 05/24/23 0654 05/25/23 1557 05/26/23 0931 05/27/23 0450 05/28/23 0329 05/29/23 0624  CRP  --   --   --   --   --  4.4* 3.8* 17.2* 24.0*  DDIMER  --   --  2.01*  --   --   --   --   --   --   PROCALCITON  --   --   --   --   --  0.36 0.33 0.55  --   INR  --  1.2  --   --   --   --   --   --   --   TSH 1.019  --   --   --   --   --   --   --   --   HGBA1C  --   --   --   --   --  6.3*  --   --   --   MG 1.9 1.9  --  2.0 1.8  --  1.9 1.9 1.9  CALCIUM  --  9.3  --  9.6 9.0 9.8 9.0 8.8* 8.8*    RADIOLOGY STUDIES/RESULTS: MR CERVICAL SPINE WO CONTRAST  Result Date: 05/28/2023 CLINICAL DATA:  Neck trauma. EXAM: MRI CERVICAL SPINE WITHOUT CONTRAST TECHNIQUE: Multiplanar, multisequence MR imaging of the cervical spine was performed. No intravenous contrast was administered. COMPARISON:  MRI of the cervical spine May 24, 2023. CT cervical spine May 22, 2023. FINDINGS: The study is degraded by motion. Axial sequences are  essentially nondiagnostic. Alignment: Mild reversal of the cervical curvature with trace stepwise retrolisthesis at C3-4 and C5-6. Vertebrae: Marrow edema and tiny discontinuity of the cortical bone in the anterior inferior aspect of the C6 vertebral body correlating with tiny fracture described on prior CT. There is also marrow edema of the C5 spinous process with associated C5-6 inter spinous soft tissue edema. Cord: Normal signal and morphology. Posterior Fossa, vertebral arteries, paraspinal tissues: Mild prevertebral soft tissue edema from C3 through the C6-7 level, improved compared to prior MRI. Edema in the posterior paraspinal muscles, right greater than left, more pronounced in the suboccipital regions. Remote left cerebellar infarct. Patchy T2 hyperintensity within the pons likely related to chronic microangiopathy. Disc levels: Posterior disc osteophyte complexes cause small indentations on the thecal sac from C2-3 through C6-7 with mild spinal canal stenosis at C5-6 and C6-7. Evaluation of degree of neural foraminal stenosis is precluded by motion artifacts in the axial sequences. IMPRESSION: 1. Motion degraded study. 2. Marrow edema and tiny discontinuity of the cortical bone in the anterior inferior aspect of the C6 vertebral body correlating with tiny fracture described on prior CT. 3. Marrow edema of the C5 spinous process with associated C5-6 inter spinous soft tissue edema suggesting ligament sprain. 4. Mild prevertebral soft tissue edema from C3 through C6-7, improved compared to prior MRI. 5. Edema in the posterior paraspinal muscles, right greater than left, more pronounced in the suboccipital regions. 6. Mild spinal canal stenosis at C5-6 and C6-7. Evaluation of degree of neural foraminal stenosis is precluded by motion artifacts in the axial sequences. Electronically Signed   By: Baldemar Lenis M.D.   On: 05/28/2023 14:15   DG Chest Port 1 View  Result Date:  05/28/2023 CLINICAL DATA:  Shortness of breath EXAM: PORTABLE CHEST 1 VIEW COMPARISON:  05/24/2023 FINDINGS: Normal heart size and mediastinal contours. Linear densities behind the heart with mild volume loss. No effusion or pneumothorax. No acute osseous findings. IMPRESSION: Unchanged infiltrate in the left lower lobe. Electronically Signed   By: Tiburcio Pea M.D.   On: 05/28/2023 07:19   CT HEAD WO CONTRAST ( )  Result Date: 05/27/2023 CLINICAL DATA:  Stroke follow-up EXAM: CT HEAD  WITHOUT CONTRAST TECHNIQUE: Contiguous axial images were obtained from the base of the skull through the vertex without intravenous contrast. RADIATION DOSE REDUCTION: This exam was performed according to the departmental dose-optimization program which includes automated exposure control, adjustment of the mA and/or kV according to patient size and/or use of iterative reconstruction technique. COMPARISON:  Brain MRI 05/23/2023 FINDINGS: Brain: Extensive chronic infarcts scattered along the bilateral cerebral convexities. Small to moderate acute infarct along the anterior right frontal cortex which is parasagittal and associated with high-density subarachnoid blood products that have mildly increased. Chronic infarcts in the posterior fossa especially affecting the cerebellum. Chronic small vessel infarcts affecting the basal ganglia. No hydrocephalus or shift. Vascular: No hyperdense vessel or unexpected calcification. Skull: Normal. Negative for fracture or focal lesion. Sinuses/Orbits: No acute finding. IMPRESSION: 1. Mild increase in subarachnoid hemorrhage at the patient's acute right frontal cortex infarct. 2. Numerous chronic infarcts Electronically Signed   By: Tiburcio Pea M.D.   On: 05/27/2023 10:25     LOS: 7 days   Signature  -    Susa Raring M.D on 05/29/2023 at 7:41 AM   -  To page go to [email or web address]

## 2023-05-30 ENCOUNTER — Inpatient Hospital Stay (HOSPITAL_COMMUNITY): Payer: Medicare Other

## 2023-05-30 DIAGNOSIS — I639 Cerebral infarction, unspecified: Secondary | ICD-10-CM | POA: Diagnosis not present

## 2023-05-30 LAB — COMPREHENSIVE METABOLIC PANEL
ALT: 145 U/L — ABNORMAL HIGH (ref 0–44)
AST: 145 U/L — ABNORMAL HIGH (ref 15–41)
Albumin: 2.6 g/dL — ABNORMAL LOW (ref 3.5–5.0)
Alkaline Phosphatase: 73 U/L (ref 38–126)
Anion gap: 15 (ref 5–15)
BUN: 14 mg/dL (ref 8–23)
CO2: 21 mmol/L — ABNORMAL LOW (ref 22–32)
Calcium: 9.1 mg/dL (ref 8.9–10.3)
Chloride: 101 mmol/L (ref 98–111)
Creatinine, Ser: 1.93 mg/dL — ABNORMAL HIGH (ref 0.61–1.24)
GFR, Estimated: 34 mL/min — ABNORMAL LOW (ref >60)
Glucose, Bld: 144 mg/dL — ABNORMAL HIGH (ref 70–99)
Potassium: 3.1 mmol/L — ABNORMAL LOW (ref 3.5–5.1)
Sodium: 137 mmol/L (ref 135–145)
Total Bilirubin: 0.8 mg/dL (ref 0.3–1.2)
Total Protein: 6.7 g/dL (ref 6.5–8.1)

## 2023-05-30 LAB — CBC WITH DIFFERENTIAL/PLATELET
Abs Immature Granulocytes: 0.07 10*3/uL (ref 0.00–0.07)
Basophils Absolute: 0 10*3/uL (ref 0.0–0.1)
Basophils Relative: 0 %
Eosinophils Absolute: 0 10*3/uL (ref 0.0–0.5)
Eosinophils Relative: 0 %
HCT: 43 % (ref 39.0–52.0)
Hemoglobin: 14.7 g/dL (ref 13.0–17.0)
Immature Granulocytes: 0 %
Lymphocytes Relative: 4 %
Lymphs Abs: 0.7 10*3/uL (ref 0.7–4.0)
MCH: 27.3 pg (ref 26.0–34.0)
MCHC: 34.2 g/dL (ref 30.0–36.0)
MCV: 79.8 fL — ABNORMAL LOW (ref 80.0–100.0)
Monocytes Absolute: 1.2 10*3/uL — ABNORMAL HIGH (ref 0.1–1.0)
Monocytes Relative: 7 %
Neutro Abs: 15.8 10*3/uL — ABNORMAL HIGH (ref 1.7–7.7)
Neutrophils Relative %: 89 %
Platelets: 262 10*3/uL (ref 150–400)
RBC: 5.39 MIL/uL (ref 4.22–5.81)
RDW: 14.8 % (ref 11.5–15.5)
WBC: 17.8 10*3/uL — ABNORMAL HIGH (ref 4.0–10.5)
nRBC: 0 % (ref 0.0–0.2)

## 2023-05-30 LAB — MAGNESIUM: Magnesium: 2 mg/dL (ref 1.7–2.4)

## 2023-05-30 LAB — PROCALCITONIN: Procalcitonin: 0.52 ng/mL

## 2023-05-30 LAB — C-REACTIVE PROTEIN: CRP: 25 mg/dL — ABNORMAL HIGH (ref <1.0)

## 2023-05-30 LAB — PROTIME-INR
INR: 1.5 — ABNORMAL HIGH (ref 0.8–1.2)
Prothrombin Time: 18.3 s — ABNORMAL HIGH (ref 11.4–15.2)

## 2023-05-30 MED ORDER — POTASSIUM CHLORIDE 10 MEQ/100ML IV SOLN
10.0000 meq | INTRAVENOUS | Status: AC
  Administered 2023-05-30 (×4): 10 meq via INTRAVENOUS
  Filled 2023-05-30 (×4): qty 100

## 2023-05-30 MED ORDER — OXYCODONE HCL 5 MG PO TABS
5.0000 mg | ORAL_TABLET | Freq: Once | ORAL | Status: AC | PRN
  Administered 2023-05-30: 5 mg via ORAL
  Filled 2023-05-30 (×2): qty 1

## 2023-05-30 MED ORDER — HYDROCODONE-ACETAMINOPHEN 5-325 MG PO TABS
1.0000 | ORAL_TABLET | Freq: Four times a day (QID) | ORAL | Status: DC | PRN
  Administered 2023-05-30 – 2023-06-01 (×7): 1 via ORAL
  Filled 2023-05-30 (×7): qty 1

## 2023-05-30 MED ORDER — NALOXONE HCL 0.4 MG/ML IJ SOLN: 0.4000 mg | INTRAMUSCULAR | Status: DC | PRN

## 2023-05-30 NOTE — Progress Notes (Signed)
PROGRESS NOTE        PATIENT DETAILS Name: Eric [name] Age: 82 y.o. Sex: male Date of Birth: 05/09/1941 Admit Date: 05/22/2023 Admitting Physician Angie Fava, DO AVW:[id number], Georgina Quint, MD  Brief Summary: Patient is a 82 y.o.  male with history of CVA, HTN-who sustained a mechanical fall at home-upon further evaluation-was found to have acute CVA and C6 vertebral body fracture.  Patient was initially evaluated at [facility] and transferred to [facility] service at [facility] for further evaluation  Significant events: 9/19>> admit to [facility]  Significant studies: 6/23>> A1c: 5.8 9/19>> CT head: 0.9 cm focus of intraparenchymal hemorrhage right frontal lobe.  Acute to subacute cortical infarct left parietal lobe. 9/19 >>CT C-spine: Acute nondisplaced fracture through the anterior inferior corner of C6 vertebral body. 9/20>> CT head: Stable 1 cm anterior right middle frontal gyrus hemorrhagic contusion versus trace subarachnoid blood. 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 9/20>> MRI brain: Acute infarct anterior right frontal lobe. 9/20>> echo: EF 55-60% 9/20>> LDL: 112 05/25/2023.  Foley catheter placed by urology 05/26/23 >> CT - 1. 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 Assessment by vascular surgery or interventional radiology is recommended. 2. Extensive wedge-shaped areas of poor enhancement of the right kidney, compatible with right renal infarction. 3. Patchy airspace consolidation within the dependent portion of the left lower lobe, concerning for pneumonia. Small focus of ground-glass opacity within the left upper lobe, which may represent an additional focus of infection. 4.  Thick-walled urinary bladder which is partially decompressed by Foley catheter. Correlate with urinalysis. 5. Patchy increased sclerosis within the T12 vertebral body, nonspecific. Consider further evaluation with nonemergent MRI of the thoracic spine. Aortic Atherosclerosis (ICD10-I70.0) and Emphysema (ICD10-J43.9).  05/27/23 - Ct head - stable, reviewed with neurologist Dr. Pearlean Brownie.   Significant microbiology data: None  Procedures: None  Consults: Neurology, urology, Pall care  Subjective: Patient in bed, appears comfortable, denies any headache, no fever, no chest pain or pressure, no shortness of breath , no abdominal pain. No new focal weakness.  No neck or low back pain.   Objective: Vitals: Blood pressure (!) 153/84, pulse (!) 114, temperature 98 F (36.7 C), temperature source Axillary, resp. rate (!) 23, height 5\' 8"  (1.727 m), weight 58.7 kg, SpO2 93%.   Exam:  Frail elderly AA male, Awake Alert, No new F.N deficits, soft c-collar in place, Foley catheter in place [place].AT,PERRAL Supple Neck, No JVD,   Symmetrical Chest wall movement, Good air movement bilaterally, CTAB RRR,No Gallops, Rubs or new Murmurs,  +ve B.Sounds, Abd Soft,   No Cyanosis, Clubbing or edema    Assessment/Plan:  Acute right frontal lobe CVA some questionable hemorrhagic contusion on CT not confirmed on MRI.  Neurology on board, currently on aspirin and statin, stable LDL, echo and recent A1c, had mild left-sided weakness and dysarthria.  Full stroke workup done, defer further recommendations to the neurology team.   C6 vertebral body fracture  CT and MRI although limited reviewed, seen by neurosurgery, hard c-collar removed by neurosurgery on 05/24/2022, PT OT, has subjectives comfort hence placed a soft c-collar on 05/27/2023.  Pain regimen adjusted  for better pain control.  HTN more than 4 days out of CVA gradually reintroduce blood pressure medications  Hypokalemia.  Replace.    Asymptomatic  transaminitis.  Could be due to Crestor, total bilirubin stable, check INR to assess synthetic function, check right upper quadrant ultrasound and hepatitis panel.    HLD  Resume Crestor-increase to 40 mg daily  BPH - Proscar but developed urinary retention, urology was called Foley catheter was placed by Dr. Alvester Morin on 05/25/2023.   Nonspecific abdominal pain and sweats morning of 05/26/2023.  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 Pearlean Brownie.  He was placed on heparin drip eventually on Eliquis, clinically better.  Head CT repeated to monitor the hemorrhagic conversion's.  Discussed with patient's son and daughter in detail.  May benefit from TEE once he is more stronger in the future.  Patchy increased sclerosis within the T12 vertebral body, nonspecific. Outpatient further evaluation with nonemergent MRI of the thoracic spine.  Possible aspiration pneumonia.  Unasyn and monitor.  Speech following.  Hypokalemia.  Replace.  Overall cachexia, failure to thrive, poor oral intake ongoing for several months, Remeron added for appetite stimulation, palliative care for goals of care for long-term, family meeting scheduled for 05/30/2023 with palliative care.    Underweight: Estimated body mass index is 19.68 kg/m as calculated from the following:   Height as of this encounter: 5\' 8"  (1.727 m).   Weight as of this encounter: 58.7 kg.   Code status:   Code Status: Full Code   DVT Prophylaxis: SCDs Start: 05/22/23 2315 apixaban (ELIQUIS) tablet 5 mg   Family Communication: Son and wife at bedside 05/25/2023, son bedside 05/26/23, 05/27/2023, daughter [name] [phone] 05/26/23, 05/27/2023, son bedside 05/28/2023, 05/29/2023   Disposition Plan: Status is: Inpatient Remains inpatient appropriate because: Severity of illness   Planned Discharge Destination:Home health   Diet: Diet Order              DIET DYS 2 Room service appropriate? No; Fluid consistency: Thin  Diet effective now                     Antimicrobial agents: Anti-infectives (From admission, onward)    Start     Dose/Rate Route Frequency Ordered Stop   05/26/23 1430  Ampicillin-Sulbactam (UNASYN) 3 g in sodium chloride 0.9 % 100 mL IVPB        3 g 200 mL/hr over 30 Minutes Intravenous Every 12 hours 05/26/23 1342          MEDICATIONS: Scheduled Meds:  apixaban  5 mg Oral BID   Chlorhexidine Gluconate Cloth  6 each Topical Daily   finasteride  5 mg Oral Daily   lidocaine  1 Application Urethral Once   mirtazapine  15 mg Oral QHS   rosuvastatin  40 mg Oral Daily   tamsulosin  0.4 mg Oral Daily   Continuous Infusions:  ampicillin-sulbactam (UNASYN) IV 3 g (05/30/23 0202)    PRN Meds:.acetaminophen **OR** acetaminophen, hydrALAZINE, HYDROcodone-acetaminophen, labetalol, melatonin, morphine injection, naLOXone (NARCAN)  injection, ondansetron (ZOFRAN) IV, polyethylene glycol   I have personally reviewed following labs and imaging studies  LABORATORY DATA:  Recent Labs  Lab 05/26/23 0931 05/27/23 0450 05/28/23 0329 05/29/23 0624 05/30/23 0609  WBC 18.7* 22.8* 22.1* 22.9* 17.8*  HGB 14.6 14.4 12.9* 13.9 14.7  HCT 43.9 41.5 38.6* 40.6 43.0  PLT 271 264 218 215 262  MCV 80.8 81.1  79.9* 79.1* 79.8*  MCH 26.9 28.1 26.7 27.1 27.3  MCHC 33.3 34.7 33.4 34.2 34.2  RDW 14.9 14.9 14.7 14.8 14.8  LYMPHSABS 0.9 0.8 0.9 0.9 0.7  MONOABS 1.1* 1.4* 1.3* 1.6* 1.2*  EOSABS 0.0 0.1 0.0 0.1 0.0  BASOSABS 0.0 0.0 0.0 0.0 0.0    Recent Labs  Lab 05/24/23 0445 05/24/23 0654 05/25/23 1557 05/26/23 0931 05/27/23 0450 05/28/23 0329 05/29/23 0624 05/30/23 0609  NA  --    < > 136 138 135 136 136 137  K  --    < > 4.0 4.4 3.6 3.1* 3.6 3.1*  CL  --    < > 104 104 103 102 101 101  CO2  --    < > 18* 17* 19* 20* 17* 21*  ANIONGAP  --    < > 14 17* 13 14 18* 15  GLUCOSE  --    < > 125* 94 96  78 98 144*  BUN  --    < > 20 24* 24* 19 16 14   CREATININE  --    < > 1.46* 1.88* 1.92* 1.83* 1.97* 1.93*  AST  --   --   --  53* 87* 71* 75* 145*  ALT  --   --   --  36 59* 56* 71* 145*  ALKPHOS  --   --   --  54 53 53 57 73  BILITOT  --   --   --  1.3* 0.9 0.9 1.3* 0.8  ALBUMIN  --   --   --  3.7 3.4* 2.8* 2.6* 2.6*  CRP  --   --   --  4.4* 3.8* 17.2* 24.0*  --   DDIMER 2.01*  --   --   --   --   --   --   --   PROCALCITON  --   --   --  0.36 0.33 0.55 0.64  --   HGBA1C  --   --   --  6.3*  --   --   --   --   MG  --    < > 1.8  --  1.9 1.9 1.9 2.0  CALCIUM  --    < > 9.0 9.8 9.0 8.8* 8.8* 9.1   < > = values in this interval not displayed.    Lab Results  Component Value Date   CHOL 116 05/23/2023   HDL 65 05/23/2023   LDLCALC 39 05/23/2023   LDLDIRECT 163.3 05/26/2008   TRIG 59 05/23/2023   CHOLHDL 1.8 05/23/2023   Lab Results  Component Value Date   HGBA1C 6.3 (H) 05/26/2023      Recent Labs  Lab 05/24/23 0445 05/24/23 0654 05/25/23 1557 05/26/23 0931 05/27/23 0450 05/28/23 0329 05/29/23 0624 05/30/23 0609  CRP  --   --   --  4.4* 3.8* 17.2* 24.0*  --   DDIMER 2.01*  --   --   --   --   --   --   --   PROCALCITON  --   --   --  0.36 0.33 0.55 0.64  --   HGBA1C  --   --   --  6.3*  --   --   --   --   MG  --    < > 1.8  --  1.9 1.9 1.9 2.0  CALCIUM  --    < > 9.0 9.8 9.0 8.8* 8.8* 9.1   < > =  values in this interval not displayed.    RADIOLOGY STUDIES/RESULTS: MR CERVICAL SPINE WO CONTRAST  Result Date: 05/28/2023 CLINICAL DATA:  Neck trauma. EXAM: MRI CERVICAL SPINE WITHOUT CONTRAST TECHNIQUE: Multiplanar, multisequence MR imaging of the cervical spine was performed. No intravenous contrast was administered. COMPARISON:  MRI of the cervical spine May 24, 2023. CT cervical spine May 22, 2023. FINDINGS: The study is degraded by motion. Axial sequences are essentially nondiagnostic. Alignment: Mild reversal of the cervical curvature with trace  stepwise retrolisthesis at C3-4 and C5-6. Vertebrae: Marrow edema and tiny discontinuity of the cortical bone in the anterior inferior aspect of the C6 vertebral body correlating with tiny fracture described on prior CT. There is also marrow edema of the C5 spinous process with associated C5-6 inter spinous soft tissue edema. Cord: Normal signal and morphology. Posterior Fossa, vertebral arteries, paraspinal tissues: Mild prevertebral soft tissue edema from C3 through the C6-7 level, improved compared to prior MRI. Edema in the posterior paraspinal muscles, right greater than left, more pronounced in the suboccipital regions. Remote left cerebellar infarct. Patchy T2 hyperintensity within the pons likely related to chronic microangiopathy. Disc levels: Posterior disc osteophyte complexes cause small indentations on the thecal sac from C2-3 through C6-7 with mild spinal canal stenosis at C5-6 and C6-7. Evaluation of degree of neural foraminal stenosis is precluded by motion artifacts in the axial sequences. IMPRESSION: 1. Motion degraded study. 2. Marrow edema and tiny discontinuity of the cortical bone in the anterior inferior aspect of the C6 vertebral body correlating with tiny fracture described on prior CT. 3. Marrow edema of the C5 spinous process with associated C5-6 inter spinous soft tissue edema suggesting ligament sprain. 4. Mild prevertebral soft tissue edema from C3 through C6-7, improved compared to prior MRI. 5. Edema in the posterior paraspinal muscles, right greater than left, more pronounced in the suboccipital regions. 6. Mild spinal canal stenosis at C5-6 and C6-7. Evaluation of degree of neural foraminal stenosis is precluded by motion artifacts in the axial sequences. Electronically Signed   By: Baldemar Lenis M.D.   On: 05/28/2023 14:15     LOS: 8 days   Signature  -    Susa Raring M.D on 05/30/2023 at 7:25 AM   -  To page go to [email or web address]

## 2023-05-30 NOTE — Progress Notes (Signed)
Physical Therapy Treatment Patient Details Name: Eric [name] MRN: [id number] DOB: [birth year] Today's Date: 05/30/2023   History of Present Illness 82 y.o. male presents to [facility] hospital on 05/23/2023 as a transfer from [facility] after a fall. CT head suggestive of acute to subacute L parietal infarct, as well as IPH in R frontal lobe. 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 Pt also found to have C6 vertebral body fx. PMH includes CVA (02/2023), HTN.    PT Comments  Pt in bed upon arrival with family present. Agreeable to PT session with max encouragement. Pt continues to need MaxA to [facility] for bed mobility. Pt was able to tolerate ~1 min sitting EOB, however, pt with heavy posterior and L lateral lean requiring MaxA. Pt states he was trying to lay back down and was resisting sitting upright. Pt was able to tolerate AROM and AAROM exercises in supine with rest breaks and family encouragement. Pt and family educated on the importance of movement through the day. Pt with slow progress towards goals. Acute PT to follow.     If plan is discharge home, recommend the following: A lot of help with walking and/or transfers;A lot of help with bathing/dressing/bathroom;Direct supervision/assist for medications management;Supervision due to cognitive status;Assist for transportation;Assistance with cooking/housework;Assistance with feeding;Help with stairs or ramp for entrance   Can travel by private vehicle     No  Equipment Recommendations  None recommended by PT       Precautions / Restrictions Precautions Precautions: Fall Required Braces or Orthoses: Other Brace Other Brace: soft cervical collar for comfort Restrictions Weight Bearing Restrictions: No     Mobility  Bed Mobility Overal bed mobility: Needs Assistance Bed Mobility: Supine to Sit, Sit to Supine     Supine to sit: Max assist, HOB elevated, Used rails Sit to supine: Total assist,  HOB elevated   General bed mobility comments: pt able to grab rail with 1 UE, MaxA for LE management and trunk elevation. Pt did not assist with sit/supine movement    Transfers    General transfer comment: NT, pt unable to tolerate sitting EOB >1 min       Balance Overall balance assessment: Needs assistance Sitting-balance support: Bilateral upper extremity supported, Feet supported Sitting balance-Leahy Scale: Poor Sitting balance - Comments: Able to sit EOB ~1 min with MaxA. Pt stated that he was trying to lay down and was heavily leaning posteriorly and to the L in an attempt to lay down Postural control: Posterior lean, Left lateral lean       Cognition Arousal: Lethargic Behavior During Therapy: Flat affect Overall Cognitive Status: Impaired/Different from baseline Area of Impairment: Attention, Following commands, Safety/judgement, Problem solving    Current Attention Level: Sustained   Following Commands: Follows one step commands with increased time Safety/Judgement: Decreased awareness of deficits   Problem Solving: Slow processing, Requires verbal cues, Requires tactile cues       Exercises General Exercises - Upper Extremity Shoulder Flexion: AAROM, Supine, Both, 10 reps Elbow Flexion: AROM, Supine, Both, 5 reps Digit Composite Flexion: AROM, Both, 10 reps, Supine General Exercises - Lower Extremity Short Arc Quad: AROM, Both, 10 reps Heel Slides: AAROM, Both, 5 reps, Supine    General Comments General comments (skin integrity, edema, etc.): HR elevates to 125 BPM with activity and agitation with movement, returns to 110 BPM with rest. RN notified      Pertinent Vitals/Pain Pain Assessment Faces Pain Scale: Hurts even  more Pain Location: back Pain Descriptors / Indicators: Discomfort, Grimacing, Guarding, Aching Pain Intervention(s): Limited activity within patient's tolerance, Monitored during session, Repositioned           PT Goals (current  goals can now be found in the care plan section) Progress towards PT goals: Progressing toward goals (slow progress)    Frequency    Min 1X/week       AM-PAC PT "6 Clicks" Mobility   Outcome Measure  Help needed turning from your back to your side while in a flat bed without using bedrails?: A Lot Help needed moving from lying on your back to sitting on the side of a flat bed without using bedrails?: A Lot Help needed moving to and from a bed to a chair (including a wheelchair)?: Total Help needed standing up from a chair using your arms (e.g., wheelchair or bedside chair)?: Total Help needed to walk in hospital room?: Total Help needed climbing 3-5 steps with a railing? : Total 6 Click Score: 8    End of Session   Activity Tolerance: Patient limited by pain;Patient limited by fatigue Patient left: in bed;with family/visitor present;with call bell/phone within reach;with bed alarm set Nurse Communication: Mobility status PT Visit Diagnosis: Other abnormalities of gait and mobility (R26.89);Muscle weakness (generalized) (M62.81);History of falling (Z91.81)     Time: 4098-1191 PT Time Calculation (min) (ACUTE ONLY): 24 min  Charges:    $Therapeutic Exercise: 8-22 mins $Therapeutic Activity: 8-22 mins PT General Charges $$ ACUTE PT VISIT: 1 Visit                     Hilton Cork, PT, DPT Secure Chat Preferred  Rehab Office [phone]    Arturo Morton Brion Aliment 05/30/2023, 12:19 PM

## 2023-05-30 NOTE — Plan of Care (Signed)
Problem: Education: Goal: Knowledge of disease or condition will improve Outcome: Progressing Goal: Knowledge of secondary prevention will improve (MUST DOCUMENT ALL) Outcome: Progressing Goal: Knowledge of patient specific risk factors will improve Loraine Leriche N/A or DELETE if not current risk factor) Outcome: Progressing   Problem: Ischemic Stroke/TIA Tissue Perfusion: Goal: Complications of ischemic stroke/TIA will be minimized Outcome: Progressing   Problem: Coping: Goal: Will verbalize positive feelings about self Outcome: Progressing Goal: Will identify appropriate support needs Outcome: Progressing   Problem: Health Behavior/Discharge Planning: Goal: Ability to manage health-related needs will improve Outcome: Progressing Goal: Goals will be collaboratively established with patient/family Outcome: Progressing   Problem: Self-Care: Goal: Ability to participate in self-care as condition permits will improve Outcome: Progressing Goal: Verbalization of feelings and concerns over difficulty with self-care will improve Outcome: Progressing Goal: Ability to communicate needs accurately will improve Outcome: Progressing   Problem: Nutrition: Goal: Risk of aspiration will decrease Outcome: Progressing Goal: Dietary intake will improve Outcome: Progressing   Problem: Education: Goal: Knowledge of General Education information will improve Description: Including pain rating scale, medication(s)/side effects and non-pharmacologic comfort measures Outcome: Progressing   Problem: Health Behavior/Discharge Planning: Goal: Ability to manage health-related needs will improve Outcome: Progressing   Problem: Clinical Measurements: Goal: Ability to maintain clinical measurements within normal limits will improve Outcome: Progressing Goal: Will remain free from infection Outcome: Progressing Goal: Diagnostic test results will improve Outcome: Progressing Goal: Respiratory  complications will improve Outcome: Progressing Goal: Cardiovascular complication will be avoided Outcome: Progressing   Problem: Activity: Goal: Risk for activity intolerance will decrease Outcome: Progressing   Problem: Nutrition: Goal: Adequate nutrition will be maintained Outcome: Progressing   Problem: Coping: Goal: Level of anxiety will decrease Outcome: Progressing   Problem: Elimination: Goal: Will not experience complications related to bowel motility Outcome: Progressing Goal: Will not experience complications related to urinary retention Outcome: Progressing   Problem: Pain Managment: Goal: General experience of comfort will improve Outcome: Progressing   Problem: Safety: Goal: Ability to remain free from injury will improve Outcome: Progressing   Problem: Skin Integrity: Goal: Risk for impaired skin integrity will decrease Outcome: Progressing   Problem: Education: Goal: Knowledge of disease or condition will improve Outcome: Progressing   Problem: Education: Goal: Knowledge of secondary prevention will improve (MUST DOCUMENT ALL) Outcome: Progressing   Problem: Education: Goal: Knowledge of secondary prevention will improve (MUST DOCUMENT ALL) Outcome: Progressing   Problem: Education: Goal: Knowledge of patient specific risk factors will improve Loraine Leriche N/A or DELETE if not current risk factor) Outcome: Progressing   Problem: Education: Goal: Knowledge of patient specific risk factors will improve Loraine Leriche N/A or DELETE if not current risk factor) Outcome: Progressing   Problem: Ischemic Stroke/TIA Tissue Perfusion: Goal: Complications of ischemic stroke/TIA will be minimized Outcome: Progressing   Problem: Ischemic Stroke/TIA Tissue Perfusion: Goal: Complications of ischemic stroke/TIA will be minimized Outcome: Progressing   Problem: Ischemic Stroke/TIA Tissue Perfusion: Goal: Complications of ischemic stroke/TIA will be minimized Outcome:  Progressing   Problem: Ischemic Stroke/TIA Tissue Perfusion: Goal: Complications of ischemic stroke/TIA will be minimized Outcome: Progressing   Problem: Coping: Goal: Will verbalize positive feelings about self Outcome: Progressing   Problem: Coping: Goal: Will verbalize positive feelings about self Outcome: Progressing   Problem: Coping: Goal: Will identify appropriate support needs Outcome: Progressing   Problem: Coping: Goal: Will identify appropriate support needs Outcome: Progressing   Problem: Health Behavior/Discharge Planning: Goal: Ability to manage health-related needs will improve Outcome: Progressing   Problem: Health Behavior/Discharge  Planning: Goal: Ability to manage health-related needs will improve Outcome: Progressing   Problem: Health Behavior/Discharge Planning: Goal: Goals will be collaboratively established with patient/family Outcome: Progressing   Problem: Health Behavior/Discharge Planning: Goal: Goals will be collaboratively established with patient/family Outcome: Progressing   Problem: Self-Care: Goal: Ability to participate in self-care as condition permits will improve Outcome: Progressing   Problem: Self-Care: Goal: Verbalization of feelings and concerns over difficulty with self-care will improve Outcome: Progressing   Problem: Self-Care: Goal: Ability to communicate needs accurately will improve Outcome: Progressing   Problem: Nutrition: Goal: Risk of aspiration will decrease Outcome: Progressing   Problem: Nutrition: Goal: Risk of aspiration will decrease Outcome: Progressing   Problem: Education: Goal: Knowledge of General Education information will improve Description: Including pain rating scale, medication(s)/side effects and non-pharmacologic comfort measures Outcome: Progressing   Problem: Nutrition: Goal: Dietary intake will improve Outcome: Progressing   Problem: Health Behavior/Discharge Planning: Goal:  Ability to manage health-related needs will improve Outcome: Progressing   Problem: Nutrition: Goal: Adequate nutrition will be maintained Outcome: Progressing   Problem: Activity: Goal: Risk for activity intolerance will decrease Outcome: Progressing   Problem: Activity: Goal: Risk for activity intolerance will decrease Outcome: Progressing   Problem: Clinical Measurements: Goal: Cardiovascular complication will be avoided Outcome: Progressing   Problem: Safety: Goal: Ability to remain free from injury will improve Outcome: Progressing   Problem: Safety: Goal: Ability to remain free from injury will improve Outcome: Progressing   Problem: Pain Managment: Goal: General experience of comfort will improve Outcome: Progressing   Problem: Pain Managment: Goal: General experience of comfort will improve Outcome: Progressing   Problem: Skin Integrity: Goal: Risk for impaired skin integrity will decrease Outcome: Progressing   Problem: Skin Integrity: Goal: Risk for impaired skin integrity will decrease Outcome: Progressing

## 2023-05-30 NOTE — Consult Note (Addendum)
Value-Based Care Institute  [facility] Inpatient Consult   05/30/2023  Eric [name] [birth year] [id number]  [facility] [[facility]]  Accountable Care Organization [ACO] Patient: [name] Medicare  Primary Care Provider:  Plotnikov, Georgina Quint, MD with [place] at [facility] which is listed to provide the transition of care follow up calls and appointment.  Addendum:  Reviewed for LOS, acute ischemic stroke with C6 FX.  Patient is currently active with [facility] [[facility]] Care Management for care coordination services.  Patient has been engaged by a [employer].  The community based plan of care has focused on disease management and community resource support.    Patient will receive a post hospital call and will be evaluated for assessments and disease process education.  Patient currently has a follow up appointment with Community RN CC on 06/02/23.  Patient is currently being recommended for a SNF rehab LOC for transition.    Plan: Will follow up for progression.and update the community team of disposition.   Of note, [facility] Care Management services does not replace or interfere with any services that are needed or arranged by inpatient [facility] care management team.   Charlesetta Shanks, RN, BSN, CCM [place]  [facility] Liaison Direct Dial: [phone] or secure chat Website: [name]

## 2023-05-30 NOTE — Progress Notes (Signed)
[facility] [place]  Subjective  -states he feels okay today.   Objective (!) 153/84 (!) 114 98 F (36.7 C) (Axillary) (!) 23 93%  Intake/Output Summary (Last 24 hours) at 05/30/2023 1110 Last data filed at 05/30/2023 0937 Gross per 24 hour  Intake 100 ml  Output 450 ml  Net -350 ml    Minimal abdominal pain on my exam Bilateral femoral pulses palpable Bilateral lower extremities warm with no motor or sensory deficit  Laboratory Lab Results: Recent Labs    05/29/23 0624 05/30/23 0609  WBC 22.9* 17.8*  HGB 13.9 14.7  HCT 40.6 43.0  PLT 215 262   BMET Recent Labs    05/29/23 0624 05/30/23 0609  NA 136 137  K 3.6 3.1*  CL 101 101  CO2 17* 21*  GLUCOSE 98 144*  BUN 16 14  CREATININE 1.97* 1.93*  CALCIUM 8.8* 9.1    COAG Lab Results  Component Value Date   INR 1.2 05/23/2023   INR 1.1 02/22/2023   INR 1.12 07/20/2013   No results found for: "PTT"  Assessment/Planning:  82 year old male that vascular was consulted for 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  Patient had about 4 to 5 days of symptoms on our evaluation.  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 Will delay repeat CTA given his renal function remains elevated and Cr 1.9 today. Bilateral femoral pulses palpable and no significant lower extremity complaints.  Again discussed with family no role for revascularization of his right renal given duration of symptoms and CTA finding.  Cephus Shelling 05/30/2023 11:10 AM --

## 2023-05-30 NOTE — Plan of Care (Signed)
Problem: Education: Goal: Knowledge of disease or condition will improve Outcome: Progressing Goal: Knowledge of secondary prevention will improve (MUST DOCUMENT ALL) Outcome: Progressing Goal: Knowledge of patient specific risk factors will improve Loraine Leriche N/A or DELETE if not current risk factor) Outcome: Progressing   Problem: Ischemic Stroke/TIA Tissue Perfusion: Goal: Complications of ischemic stroke/TIA will be minimized Outcome: Progressing   Problem: Coping: Goal: Will verbalize positive feelings about self Outcome: Progressing Goal: Will identify appropriate support needs Outcome: Progressing   Problem: Health Behavior/Discharge Planning: Goal: Ability to manage health-related needs will improve Outcome: Progressing Goal: Goals will be collaboratively established with patient/family Outcome: Progressing   Problem: Self-Care: Goal: Ability to participate in self-care as condition permits will improve Outcome: Progressing Goal: Verbalization of feelings and concerns over difficulty with self-care will improve Outcome: Progressing Goal: Ability to communicate needs accurately will improve Outcome: Progressing   Problem: Nutrition: Goal: Risk of aspiration will decrease Outcome: Progressing Goal: Dietary intake will improve Outcome: Progressing   Problem: Education: Goal: Knowledge of General Education information will improve Description: Including pain rating scale, medication(s)/side effects and non-pharmacologic comfort measures Outcome: Progressing   Problem: Health Behavior/Discharge Planning: Goal: Ability to manage health-related needs will improve Outcome: Progressing   Problem: Clinical Measurements: Goal: Ability to maintain clinical measurements within normal limits will improve Outcome: Progressing Goal: Will remain free from infection Outcome: Progressing Goal: Diagnostic test results will improve Outcome: Progressing Goal: Respiratory  complications will improve Outcome: Progressing Goal: Cardiovascular complication will be avoided Outcome: Progressing   Problem: Activity: Goal: Risk for activity intolerance will decrease Outcome: Progressing   Problem: Nutrition: Goal: Adequate nutrition will be maintained Outcome: Progressing   Problem: Coping: Goal: Level of anxiety will decrease Outcome: Progressing   Problem: Elimination: Goal: Will not experience complications related to bowel motility Outcome: Progressing Goal: Will not experience complications related to urinary retention Outcome: Progressing   Problem: Pain Managment: Goal: General experience of comfort will improve Outcome: Progressing   Problem: Safety: Goal: Ability to remain free from injury will improve Outcome: Progressing   Problem: Skin Integrity: Goal: Risk for impaired skin integrity will decrease Outcome: Progressing   Problem: Education: Goal: Knowledge of disease or condition will improve Outcome: Progressing   Problem: Education: Goal: Knowledge of disease or condition will improve Outcome: Progressing   Problem: Education: Goal: Knowledge of secondary prevention will improve (MUST DOCUMENT ALL) Outcome: Progressing   Problem: Education: Goal: Knowledge of patient specific risk factors will improve Loraine Leriche N/A or DELETE if not current risk factor) Outcome: Progressing   Problem: Coping: Goal: Will verbalize positive feelings about self Outcome: Progressing   Problem: Coping: Goal: Will identify appropriate support needs Outcome: Progressing   Problem: Nutrition: Goal: Risk of aspiration will decrease Outcome: Progressing   Problem: Self-Care: Goal: Ability to communicate needs accurately will improve Outcome: Progressing   Problem: Self-Care: Goal: Ability to communicate needs accurately will improve Outcome: Progressing   Problem: Self-Care: Goal: Verbalization of feelings and concerns over difficulty  with self-care will improve Outcome: Progressing   Problem: Self-Care: Goal: Verbalization of feelings and concerns over difficulty with self-care will improve Outcome: Progressing   Problem: Clinical Measurements: Goal: Will remain free from infection Outcome: Progressing   Problem: Clinical Measurements: Goal: Diagnostic test results will improve Outcome: Progressing   Problem: Clinical Measurements: Goal: Will remain free from infection Outcome: Progressing   Problem: Clinical Measurements: Goal: Respiratory complications will improve Outcome: Progressing   Problem: Nutrition: Goal: Adequate nutrition will be maintained Outcome: Progressing  Problem: Activity: Goal: Risk for activity intolerance will decrease Outcome: Progressing   Problem: Pain Managment: Goal: General experience of comfort will improve Outcome: Progressing   Problem: Skin Integrity: Goal: Risk for impaired skin integrity will decrease Outcome: Progressing

## 2023-05-31 DIAGNOSIS — Z515 Encounter for palliative care: Secondary | ICD-10-CM

## 2023-05-31 DIAGNOSIS — S12501A Unspecified nondisplaced fracture of sixth cervical vertebra, initial encounter for closed fracture: Secondary | ICD-10-CM | POA: Diagnosis not present

## 2023-05-31 DIAGNOSIS — I639 Cerebral infarction, unspecified: Secondary | ICD-10-CM | POA: Diagnosis not present

## 2023-05-31 DIAGNOSIS — I629 Nontraumatic intracranial hemorrhage, unspecified: Secondary | ICD-10-CM | POA: Diagnosis not present

## 2023-05-31 DIAGNOSIS — G9340 Encephalopathy, unspecified: Secondary | ICD-10-CM | POA: Diagnosis not present

## 2023-05-31 DIAGNOSIS — Z7189 Other specified counseling: Secondary | ICD-10-CM

## 2023-05-31 LAB — CBC WITH DIFFERENTIAL/PLATELET
Abs Immature Granulocytes: 0.1 10*3/uL — ABNORMAL HIGH (ref 0.00–0.07)
Basophils Absolute: 0 10*3/uL (ref 0.0–0.1)
Basophils Relative: 0 %
Eosinophils Absolute: 0 10*3/uL (ref 0.0–0.5)
Eosinophils Relative: 0 %
HCT: 38.4 % — ABNORMAL LOW (ref 39.0–52.0)
Hemoglobin: 13.3 g/dL (ref 13.0–17.0)
Immature Granulocytes: 1 %
Lymphocytes Relative: 6 %
Lymphs Abs: 0.9 10*3/uL (ref 0.7–4.0)
MCH: 27 pg (ref 26.0–34.0)
MCHC: 34.6 g/dL (ref 30.0–36.0)
MCV: 77.9 fL — ABNORMAL LOW (ref 80.0–100.0)
Monocytes Absolute: 1.4 10*3/uL — ABNORMAL HIGH (ref 0.1–1.0)
Monocytes Relative: 10 %
Neutro Abs: 11.7 10*3/uL — ABNORMAL HIGH (ref 1.7–7.7)
Neutrophils Relative %: 83 %
Platelets: 293 10*3/uL (ref 150–400)
RBC: 4.93 MIL/uL (ref 4.22–5.81)
RDW: 14.7 % (ref 11.5–15.5)
WBC: 14.1 10*3/uL — ABNORMAL HIGH (ref 4.0–10.5)
nRBC: 0 % (ref 0.0–0.2)

## 2023-05-31 LAB — COMPREHENSIVE METABOLIC PANEL
ALT: 306 U/L — ABNORMAL HIGH (ref 0–44)
AST: 317 U/L — ABNORMAL HIGH (ref 15–41)
Albumin: 2.2 g/dL — ABNORMAL LOW (ref 3.5–5.0)
Alkaline Phosphatase: 79 U/L (ref 38–126)
Anion gap: 11 (ref 5–15)
BUN: 15 mg/dL (ref 8–23)
CO2: 22 mmol/L (ref 22–32)
Calcium: 8.7 mg/dL — ABNORMAL LOW (ref 8.9–10.3)
Chloride: 105 mmol/L (ref 98–111)
Creatinine, Ser: 1.98 mg/dL — ABNORMAL HIGH (ref 0.61–1.24)
GFR, Estimated: 33 mL/min — ABNORMAL LOW (ref >60)
Glucose, Bld: 109 mg/dL — ABNORMAL HIGH (ref 70–99)
Potassium: 3.6 mmol/L (ref 3.5–5.1)
Sodium: 138 mmol/L (ref 135–145)
Total Bilirubin: 0.8 mg/dL (ref 0.3–1.2)
Total Protein: 6.1 g/dL — ABNORMAL LOW (ref 6.5–8.1)

## 2023-05-31 LAB — PROCALCITONIN: Procalcitonin: 0.41 ng/mL

## 2023-05-31 LAB — CULTURE, BLOOD (ROUTINE X 2)
Culture: NO GROWTH
Culture: NO GROWTH

## 2023-05-31 LAB — HEPATITIS PANEL, ACUTE
HCV Ab: NONREACTIVE
Hep A IgM: NONREACTIVE
Hep B C IgM: NONREACTIVE
Hepatitis B Surface Ag: NONREACTIVE

## 2023-05-31 MED ORDER — ROSUVASTATIN CALCIUM 5 MG PO TABS: 10.0000 mg | ORAL_TABLET | Freq: Every day | ORAL | Status: DC

## 2023-05-31 MED ORDER — CARVEDILOL 6.25 MG PO TABS
6.2500 mg | ORAL_TABLET | Freq: Two times a day (BID) | ORAL | Status: DC
  Administered 2023-05-31 – 2023-06-12 (×5): 6.25 mg via ORAL
  Filled 2023-05-31 (×7): qty 1

## 2023-05-31 NOTE — Progress Notes (Signed)
Daily Progress Note   Patient Name: Eric [name]       Date: 05/31/2023 DOB: October 21, 1940  Age: 82 y.o. MRN#: [id number] Attending Physician: Leroy Sea, MD Primary Care Physician: Tresa Garter, MD Admit Date: 05/22/2023  Reason for Consultation/Follow-up: Establishing goals of care  Subjective: I have reviewed medical records including EPIC notes, MAR, and labs/imaging.  Patient assessed at the bedside.  He is irritable, does not want to discuss with me.  His wife and daughter are present for scheduled family meeting.  His son then joined by phone and another son arrived later on in the conversation to participate as well.  We discussed a brief life review and then focused on patient's current illness.  He states that his family is very important to him and he enjoys spending time with his grandchildren.  He goes to church and previously worked in [employer], research (physical sciences).  He does not like being asked to many questions.  Provided a medical update and review of patient's acute illness in the context of his chronic comorbidities, discussing his several concerning issues such as acute stroke, brain bleed, elevated liver enzymes, AKI, possible pneumonia, thrombus in the aorta and renal infarct.  Discussed his poor oral intake.  We discussed the current care plan in detail.  Patient's son indicates that he would be willing to donate the if necessary I shared that I am very concerned patient would not tolerate such an extensive procedure.  Family understand he is at high risk for further decline if he does not improve his oral intake or strength.  They are very disappointed about the care he has received, sharing extensive list of grievances since admission.  His son notes feeling that he is  essentially being left to die and questions whether he should get a lawyer involved.  Emotional support and therapeutic listening was provided.  Provided with number for office of patient experience and encouraged additional goals of care discussions after satisfactory resolution of their current concerns.  Emphasized the importance of planning for the worst while also hoping for the best.  Patient's family shared that they have decided not to pursue a feeding tube if he is unable to eat and improve his oral intake.  We discussed the possibility of comfort feeds and transition to hospice care as an alternative option.  They do not feel like they could support him with hospice at home.  The option of a hospice facility was briefly introduced, though family is very clear they are not ready for this and still hoping that he can improve.  All questions and concerns addressed. Encouraged to call with questions and/or concerns. PMT card previously provided.  Length of Stay: 9  Physical Exam Vitals and nursing note reviewed.  Constitutional:      General: He is not in acute distress.    Appearance: He is ill-appearing.  Cardiovascular:     Rate and Rhythm: Normal rate.  Pulmonary:     Effort: No respiratory distress.  Skin:    General: Skin is warm and dry.  Neurological:     Mental Status: He is alert. He is confused.  Psychiatric:        Cognition and Memory: Cognition is impaired.     Comments: Repetitive speech             Vital Signs: BP (!) 169/77   Pulse 88   Temp 98.5 F (36.9 C)   Resp 20   Ht 5\' 8"  (1.727 m)   Wt 58.7 kg   SpO2 96%   BMI 19.68 kg/m  SpO2: SpO2: 96 % O2 Device: O2 Device: Room Air O2 Flow Rate: O2 Flow Rate (L/min): 2 L/min       Palliative Assessment/Data: PPS 20%    Palliative Care Assessment & Plan   Patient Profile: 82 y.o. male  with past medical history of stroke, essential pretension, who was admitted to [facility] on 05/22/2023 with  suspected acute versus subacute ischemic stroke after presenting from home to [facility] ED for evaluation of fall.   Assessment: Principal Problem:   Acute ischemic stroke [facility]) Active Problems:   Essential hypertension   Acute intracranial hemorrhage (HCC)   Acute encephalopathy   C6 cervical fracture (HCC)   Elevated troponin   BPH (benign prostatic hyperplasia)   Concern about end of life  Recommendations/Plan: Continue full code/full scope treatment with the exception of no feeding tube Patient's family are very hopeful for improvement, wish to encourage oral intake and mobility Provided with office of patient experience contact information Psychosocial and emotional support provided Ongoing goals of care discussions pending clinical course PMT will continue to follow and support holistically  Goals of Care and Additional Recommendations: Limitations on Scope of Treatment: Full Scope Treatment  Prognosis:  If continues with minimal oral intake, poor  Discharge Planning: To Be Determined  Care plan was discussed with primary RN, wife, 2 sons, daughter, Dr. Thedore Mins   Total time: I spent 95 minutes in the care of the patient today in the above activities and documenting the encounter.  MDM: High   Dot [place] Palliative Medicine Team Team phone # [phone]  Thank you for allowing the Palliative Medicine Team to assist in the care of this patient. Please utilize secure chat with additional questions, if there is no response within 30 minutes please call the above phone number.  Palliative Medicine Team providers are available by phone from 7am to 7pm daily and can be reached through the team cell phone.  Should this patient require assistance outside of these hours, please call the patient's attending physician.  Portions of this note are a verbal dictation therefore any spelling and/or grammatical errors are due to the "[employer] One" system  interpretation.

## 2023-05-31 NOTE — Progress Notes (Signed)
[facility] [place]  Subjective  - No complaints, curled in bed. Son at bedside trying to feed him No subjective abdominal pain   Objective (!) 169/77 88 98.5 F (36.9 C) 20 96%  Intake/Output Summary (Last 24 hours) at 05/31/2023 1159 Last data filed at 05/30/2023 1203 Gross per 24 hour  Intake 200 ml  Output --  Net 200 ml    Minimal abdominal pain  Bilateral femoral pulses palpable Bilateral lower extremities warm with no motor or sensory deficit  Laboratory Lab Results: Recent Labs    05/30/23 0609 05/31/23 0721  WBC 17.8* 14.1*  HGB 14.7 13.3  HCT 43.0 38.4*  PLT 262 293   BMET Recent Labs    05/30/23 0609 05/31/23 0721  NA 137 138  K 3.1* 3.6  CL 101 105  CO2 21* 22  GLUCOSE 144* 109*  BUN 14 15  CREATININE 1.93* 1.98*  CALCIUM 9.1 8.7*    COAG Lab Results  Component Value Date   INR 1.5 (H) 05/30/2023   INR 1.2 05/23/2023   INR 1.1 02/22/2023   No results found for: "PTT"  Assessment/Planning:  82 year old male that vascular was consulted for 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  Patient had about 4 to 5 days of symptoms on our evaluation.  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   Continues to have elevated creatinine. Prior stroke appears to have been very debilitating. No plan for right renal artery revascularization due to duration of symptoms and CTA findings    Eric [name] 05/31/2023 11:59 AM --

## 2023-05-31 NOTE — Plan of Care (Signed)
Problem: Education: Goal: Knowledge of disease or condition will improve Outcome: Progressing Goal: Knowledge of secondary prevention will improve (MUST DOCUMENT ALL) Outcome: Progressing Goal: Knowledge of patient specific risk factors will improve Loraine Leriche N/A or DELETE if not current risk factor) Outcome: Progressing   Problem: Ischemic Stroke/TIA Tissue Perfusion: Goal: Complications of ischemic stroke/TIA will be minimized Outcome: Progressing   Problem: Coping: Goal: Will verbalize positive feelings about self Outcome: Progressing Goal: Will identify appropriate support needs Outcome: Progressing   Problem: Health Behavior/Discharge Planning: Goal: Ability to manage health-related needs will improve Outcome: Progressing Goal: Goals will be collaboratively established with patient/family Outcome: Progressing   Problem: Self-Care: Goal: Ability to participate in self-care as condition permits will improve Outcome: Progressing Goal: Verbalization of feelings and concerns over difficulty with self-care will improve Outcome: Progressing Goal: Ability to communicate needs accurately will improve Outcome: Progressing   Problem: Nutrition: Goal: Risk of aspiration will decrease Outcome: Progressing Goal: Dietary intake will improve Outcome: Progressing   Problem: Education: Goal: Knowledge of General Education information will improve Description: Including pain rating scale, medication(s)/side effects and non-pharmacologic comfort measures Outcome: Progressing   Problem: Health Behavior/Discharge Planning: Goal: Ability to manage health-related needs will improve Outcome: Progressing   Problem: Clinical Measurements: Goal: Ability to maintain clinical measurements within normal limits will improve Outcome: Progressing Goal: Will remain free from infection Outcome: Progressing Goal: Diagnostic test results will improve Outcome: Progressing Goal: Respiratory  complications will improve Outcome: Progressing Goal: Cardiovascular complication will be avoided Outcome: Progressing   Problem: Activity: Goal: Risk for activity intolerance will decrease Outcome: Progressing   Problem: Nutrition: Goal: Adequate nutrition will be maintained Outcome: Progressing   Problem: Coping: Goal: Level of anxiety will decrease Outcome: Progressing   Problem: Elimination: Goal: Will not experience complications related to bowel motility Outcome: Progressing Goal: Will not experience complications related to urinary retention Outcome: Progressing   Problem: Pain Managment: Goal: General experience of comfort will improve Outcome: Progressing   Problem: Safety: Goal: Ability to remain free from injury will improve Outcome: Progressing   Problem: Skin Integrity: Goal: Risk for impaired skin integrity will decrease Outcome: Progressing   Problem: Education: Goal: Knowledge of disease or condition will improve Outcome: Progressing   Problem: Education: Goal: Knowledge of secondary prevention will improve (MUST DOCUMENT ALL) Outcome: Progressing   Problem: Education: Goal: Knowledge of patient specific risk factors will improve Loraine Leriche N/A or DELETE if not current risk factor) Outcome: Progressing   Problem: Ischemic Stroke/TIA Tissue Perfusion: Goal: Complications of ischemic stroke/TIA will be minimized Outcome: Progressing   Problem: Coping: Goal: Will identify appropriate support needs Outcome: Progressing   Problem: Health Behavior/Discharge Planning: Goal: Ability to manage health-related needs will improve Outcome: Progressing   Problem: Health Behavior/Discharge Planning: Goal: Goals will be collaboratively established with patient/family Outcome: Progressing   Problem: Self-Care: Goal: Ability to participate in self-care as condition permits will improve Outcome: Progressing   Problem: Self-Care: Goal: Verbalization of  feelings and concerns over difficulty with self-care will improve Outcome: Progressing   Problem: Self-Care: Goal: Ability to communicate needs accurately will improve Outcome: Progressing   Problem: Nutrition: Goal: Dietary intake will improve Outcome: Progressing   Problem: Education: Goal: Knowledge of General Education information will improve Description: Including pain rating scale, medication(s)/side effects and non-pharmacologic comfort measures Outcome: Progressing   Problem: Health Behavior/Discharge Planning: Goal: Ability to manage health-related needs will improve Outcome: Progressing   Problem: Clinical Measurements: Goal: Ability to maintain clinical measurements within normal limits will improve Outcome:  Progressing   Problem: Clinical Measurements: Goal: Cardiovascular complication will be avoided Outcome: Progressing   Problem: Clinical Measurements: Goal: Cardiovascular complication will be avoided Outcome: Progressing   Problem: Clinical Measurements: Goal: Respiratory complications will improve Outcome: Progressing   Problem: Clinical Measurements: Goal: Diagnostic test results will improve Outcome: Progressing   Problem: Clinical Measurements: Goal: Diagnostic test results will improve Outcome: Progressing   Problem: Clinical Measurements: Goal: Will remain free from infection Outcome: Progressing   Problem: Clinical Measurements: Goal: Will remain free from infection Outcome: Progressing   Problem: Clinical Measurements: Goal: Ability to maintain clinical measurements within normal limits will improve Outcome: Progressing   Problem: Clinical Measurements: Goal: Ability to maintain clinical measurements within normal limits will improve Outcome: Progressing   Problem: Pain Managment: Goal: General experience of comfort will improve Outcome: Progressing   Problem: Elimination: Goal: Will not experience complications related to  urinary retention Outcome: Progressing   Problem: Elimination: Goal: Will not experience complications related to urinary retention Outcome: Progressing   Problem: Elimination: Goal: Will not experience complications related to bowel motility Outcome: Progressing   Problem: Elimination: Goal: Will not experience complications related to bowel motility Outcome: Progressing   Problem: Pain Managment: Goal: General experience of comfort will improve Outcome: Progressing   Problem: Safety: Goal: Ability to remain free from injury will improve Outcome: Progressing   Problem: Safety: Goal: Ability to remain free from injury will improve Outcome: Progressing   Problem: Skin Integrity: Goal: Risk for impaired skin integrity will decrease Outcome: Progressing   Problem: Skin Integrity: Goal: Risk for impaired skin integrity will decrease Outcome: Progressing

## 2023-05-31 NOTE — Progress Notes (Signed)
PROGRESS NOTE        PATIENT DETAILS Name: Eric [name] Age: 82 y.o. Sex: male Date of Birth: Jan 27, 1941 Admit Date: 05/22/2023 Admitting Physician Angie Fava, DO GLO:VFIEPPIRJ, Georgina Quint, MD  Brief Summary: Patient is a 82 y.o.  male with history of CVA, HTN-who sustained a mechanical fall at home-upon further evaluation-was found to have acute CVA and C6 vertebral body fracture.  Patient was initially evaluated at [facility] and transferred to [facility] service at [facility] for further evaluation  Significant events: 9/19>> admit to [facility]  Significant studies: 6/23>> A1c: 5.8 9/19>> CT head: 0.9 cm focus of intraparenchymal hemorrhage right frontal lobe.  Acute to subacute cortical infarct left parietal lobe. 9/19 >>CT C-spine: Acute nondisplaced fracture through the anterior inferior corner of C6 vertebral body. 9/20>> CT head: Stable 1 cm anterior right middle frontal gyrus hemorrhagic contusion versus trace subarachnoid blood. 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 9/20>> MRI brain: Acute infarct anterior right frontal lobe. 9/20>> echo: EF 55-60% 9/20>> LDL: 112 05/25/2023.  Foley catheter placed by urology 05/26/23 >> CT - 1. 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 Assessment by vascular surgery or interventional radiology is recommended. 2. Extensive wedge-shaped areas of poor enhancement of the right kidney, compatible with right renal infarction. 3. Patchy airspace consolidation within the dependent portion of the left lower lobe, concerning for pneumonia. Small focus of ground-glass opacity within the left upper lobe, which may represent an additional focus of infection. 4.  Thick-walled urinary bladder which is partially decompressed by Foley catheter. Correlate with urinalysis. 5. Patchy increased sclerosis within the T12 vertebral body, nonspecific. Consider further evaluation with nonemergent MRI of the thoracic spine. Aortic Atherosclerosis (ICD10-I70.0) and Emphysema (ICD10-J43.9).  05/27/23 - Ct head - stable, reviewed with neurologist Dr. Pearlean Brownie.   Significant microbiology data: None  Procedures: None  Consults: Neurology, urology, Pall care  Subjective: Patient in bed, appears comfortable, denies any headache, no fever, no chest pain or pressure, no shortness of breath , no abdominal pain. No new focal weakness.  No neck or low back pain.   Objective: Vitals: Blood pressure (!) 175/109, pulse (!) 104, temperature 98.5 F (36.9 C), resp. rate 20, height 5\' 8"  (1.727 m), weight 58.7 kg, SpO2 96%.   Exam:  Frail elderly AA male, Awake Alert, No new F.N deficits, soft c-collar in place, Foley catheter in place [place].AT,PERRAL Supple Neck, No JVD,   Symmetrical Chest wall movement, Good air movement bilaterally, CTAB RRR,No Gallops, Rubs or new Murmurs,  +ve B.Sounds, Abd Soft,   No Cyanosis, Clubbing or edema    Assessment/Plan:  Acute right frontal lobe CVA some questionable hemorrhagic contusion on CT not confirmed on MRI.  Neurology on board, currently on aspirin and statin, stable LDL, echo and recent A1c, had mild left-sided weakness and dysarthria.  Full stroke workup done, defer further recommendations to the neurology team.   C6 vertebral body fracture  CT and MRI although limited reviewed, seen by neurosurgery, hard c-collar removed by neurosurgery on 05/24/2022, PT OT, has subjectives comfort hence placed a soft c-collar on 05/27/2023.  Pain regimen adjusted for better pain control.  HTN more than 4 days out of CVA gradually reintroduce blood pressure medications  Hypokalemia.  Replace.    Asymptomatic transaminitis.  Could be due to  Crestor, total bilirubin stable, INR stable as on Hep gtt, stable right upper quadrant ultrasound, pending acute hepatitis panel.    HLD  Resume Crestor-increase to 40 mg daily  BPH - Proscar but developed urinary retention, urology was called Foley catheter was placed by Dr. Alvester Morin on 05/25/2023.   Nonspecific abdominal pain and sweats morning of 05/26/2023.  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 Pearlean Brownie.  He was placed on heparin drip eventually on Eliquis, clinically better.  Head CT repeated to monitor the hemorrhagic conversion's.  Discussed with patient's son and daughter in detail.  May benefit from TEE once he is more stronger in the future.  Patchy increased sclerosis within the T12 vertebral body, nonspecific. Outpatient further evaluation with nonemergent MRI of the thoracic spine.  Possible aspiration pneumonia.  Unasyn and monitor.  Speech following.  Hypokalemia.  Replace.  Overall cachexia, failure to thrive, poor oral intake ongoing for several months, Remeron added for appetite stimulation, palliative care for goals of care for long-term, family meeting scheduled for 05/30/2023 with palliative care.    Underweight: Estimated body mass index is 19.68 kg/m as calculated from the following:   Height as of this encounter: 5\' 8"  (1.727 m).   Weight as of this encounter: 58.7 kg.   Code status:   Code Status: Full Code   DVT Prophylaxis: SCDs Start: 05/22/23 2315 apixaban (ELIQUIS) tablet 5 mg   Family Communication: Son and wife at bedside 05/25/2023, son bedside 05/26/23, 05/27/2023, daughter [name] [phone] 05/26/23, 05/27/2023, son bedside 05/28/2023, 05/29/2023   Disposition Plan: Status is: Inpatient Remains inpatient appropriate because: Severity of illness   Planned Discharge Destination:Home health   Diet: Diet Order             DIET DYS 2 Room  service appropriate? No; Fluid consistency: Thin  Diet effective now                     Antimicrobial agents: Anti-infectives (From admission, onward)    Start     Dose/Rate Route Frequency Ordered Stop   05/26/23 1430  Ampicillin-Sulbactam (UNASYN) 3 g in sodium chloride 0.9 % 100 mL IVPB        3 g 200 mL/hr over 30 Minutes Intravenous Every 12 hours 05/26/23 1342          MEDICATIONS: Scheduled Meds:  apixaban  5 mg Oral BID   carvedilol  6.25 mg Oral BID WC   Chlorhexidine Gluconate Cloth  6 each Topical Daily   finasteride  5 mg Oral Daily   lidocaine  1 Application Urethral Once   mirtazapine  15 mg Oral QHS   rosuvastatin  40 mg Oral Daily   tamsulosin  0.4 mg Oral Daily   Continuous Infusions:  ampicillin-sulbactam (UNASYN) IV 3 g (05/31/23 0153)    PRN Meds:.acetaminophen **OR** acetaminophen, hydrALAZINE, HYDROcodone-acetaminophen, labetalol, melatonin, morphine injection, naLOXone (NARCAN)  injection, ondansetron (ZOFRAN) IV, polyethylene glycol   I have personally reviewed following labs and imaging studies  LABORATORY DATA:  Recent Labs  Lab 05/26/23 0931 05/27/23 0450 05/28/23 0329 05/29/23 0624 05/30/23 0609  WBC 18.7* 22.8* 22.1* 22.9* 17.8*  HGB 14.6 14.4 12.9* 13.9 14.7  HCT 43.9 41.5 38.6* 40.6 43.0  PLT 271 264 218 215  262  MCV 80.8 81.1 79.9* 79.1* 79.8*  MCH 26.9 28.1 26.7 27.1 27.3  MCHC 33.3 34.7 33.4 34.2 34.2  RDW 14.9 14.9 14.7 14.8 14.8  LYMPHSABS 0.9 0.8 0.9 0.9 0.7  MONOABS 1.1* 1.4* 1.3* 1.6* 1.2*  EOSABS 0.0 0.1 0.0 0.1 0.0  BASOSABS 0.0 0.0 0.0 0.0 0.0    Recent Labs  Lab 05/25/23 1557 05/26/23 0931 05/27/23 0450 05/28/23 0329 05/29/23 0624 05/30/23 0609 05/30/23 1746  NA 136 138 135 136 136 137  --   K 4.0 4.4 3.6 3.1* 3.6 3.1*  --   CL 104 104 103 102 101 101  --   CO2 18* 17* 19* 20* 17* 21*  --   ANIONGAP 14 17* 13 14 18* 15  --   GLUCOSE 125* 94 96 78 98 144*  --   BUN 20 24* 24* 19 16 14   --    CREATININE 1.46* 1.88* 1.92* 1.83* 1.97* 1.93*  --   AST  --  53* 87* 71* 75* 145*  --   ALT  --  36 59* 56* 71* 145*  --   ALKPHOS  --  54 53 53 57 73  --   BILITOT  --  1.3* 0.9 0.9 1.3* 0.8  --   ALBUMIN  --  3.7 3.4* 2.8* 2.6* 2.6*  --   CRP  --  4.4* 3.8* 17.2* 24.0* 25.0*  --   PROCALCITON  --  0.36 0.33 0.55 0.64 0.52  --   INR  --   --   --   --   --   --  1.5*  HGBA1C  --  6.3*  --   --   --   --   --   MG 1.8  --  1.9 1.9 1.9 2.0  --   CALCIUM 9.0 9.8 9.0 8.8* 8.8* 9.1  --     Lab Results  Component Value Date   CHOL 116 05/23/2023   HDL 65 05/23/2023   LDLCALC 39 05/23/2023   LDLDIRECT 163.3 05/26/2008   TRIG 59 05/23/2023   CHOLHDL 1.8 05/23/2023   Lab Results  Component Value Date   HGBA1C 6.3 (H) 05/26/2023      Recent Labs  Lab 05/25/23 1557 05/26/23 0931 05/27/23 0450 05/28/23 0329 05/29/23 0624 05/30/23 0609 05/30/23 1746  CRP  --  4.4* 3.8* 17.2* 24.0* 25.0*  --   PROCALCITON  --  0.36 0.33 0.55 0.64 0.52  --   INR  --   --   --   --   --   --  1.5*  HGBA1C  --  6.3*  --   --   --   --   --   MG 1.8  --  1.9 1.9 1.9 2.0  --   CALCIUM 9.0 9.8 9.0 8.8* 8.8* 9.1  --     RADIOLOGY STUDIES/RESULTS: US Abdomen Limited RUQ (LIVER/GB)  Result Date: 05/30/2023 CLINICAL DATA:  Elevated liver enzymes. EXAM: ULTRASOUND ABDOMEN LIMITED RIGHT UPPER QUADRANT COMPARISON:  None Available. FINDINGS: Gallbladder: No gallstones or wall thickening visualized. No sonographic Murphy sign noted by sonographer. Common bile duct: The common bile duct is poorly visualized. Liver: The visualized liver is unremarkable. The left lobe of the liver is fluid is visualized and obscured by bowel gas. Portal vein is patent on color Doppler imaging with normal direction of blood flow towards the liver. Other: None. IMPRESSION: Unremarkable visualized right upper quadrant ultrasound. Electronically Signed   By:  Elgie Collard M.D.   On: 05/30/2023 18:20     LOS: 9 days    Signature  -    Susa Raring M.D on 05/31/2023 at 8:02 AM   -  To page go to [email or web address]

## 2023-05-31 NOTE — Plan of Care (Signed)
Problem: Education: Goal: Knowledge of disease or condition will improve 05/31/2023 0456 by Salome Spotted, RN Outcome: Progressing 05/30/2023 1949 by Salome Spotted, RN Outcome: Progressing Goal: Knowledge of secondary prevention will improve (MUST DOCUMENT ALL) 05/31/2023 0456 by Salome Spotted, RN Outcome: Progressing 05/30/2023 1949 by Salome Spotted, RN Outcome: Progressing Goal: Knowledge of patient specific risk factors will improve Loraine Leriche N/A or DELETE if not current risk factor) 05/31/2023 0456 by Salome Spotted, RN Outcome: Progressing 05/30/2023 1949 by Salome Spotted, RN Outcome: Progressing   Problem: Ischemic Stroke/TIA Tissue Perfusion: Goal: Complications of ischemic stroke/TIA will be minimized 05/31/2023 0456 by Salome Spotted, RN Outcome: Progressing 05/30/2023 1949 by Salome Spotted, RN Outcome: Progressing   Problem: Coping: Goal: Will verbalize positive feelings about self 05/31/2023 0456 by Salome Spotted, RN Outcome: Progressing 05/30/2023 1949 by Salome Spotted, RN Outcome: Progressing Goal: Will identify appropriate support needs 05/31/2023 0456 by Salome Spotted, RN Outcome: Progressing 05/30/2023 1949 by Salome Spotted, RN Outcome: Progressing   Problem: Health Behavior/Discharge Planning: Goal: Ability to manage health-related needs will improve 05/31/2023 0456 by Salome Spotted, RN Outcome: Progressing 05/30/2023 1949 by Salome Spotted, RN Outcome: Progressing Goal: Goals will be collaboratively established with patient/family 05/31/2023 0456 by Salome Spotted, RN Outcome: Progressing 05/30/2023 1949 by Salome Spotted, RN Outcome: Progressing   Problem: Self-Care: Goal: Ability to participate in self-care as condition permits will improve 05/31/2023 0456 by Salome Spotted, RN Outcome: Progressing 05/30/2023 1949 by Salome Spotted, RN Outcome: Progressing Goal: Verbalization of feelings and concerns over difficulty with self-care will improve 05/31/2023 0456 by Salome Spotted,  RN Outcome: Progressing 05/30/2023 1949 by Salome Spotted, RN Outcome: Progressing Goal: Ability to communicate needs accurately will improve 05/31/2023 0456 by Salome Spotted, RN Outcome: Progressing 05/30/2023 1949 by Salome Spotted, RN Outcome: Progressing   Problem: Nutrition: Goal: Risk of aspiration will decrease 05/31/2023 0456 by Salome Spotted, RN Outcome: Progressing 05/30/2023 1949 by Salome Spotted, RN Outcome: Progressing Goal: Dietary intake will improve 05/31/2023 0456 by Salome Spotted, RN Outcome: Progressing 05/30/2023 1949 by Salome Spotted, RN Outcome: Progressing   Problem: Education: Goal: Knowledge of General Education information will improve Description: Including pain rating scale, medication(s)/side effects and non-pharmacologic comfort measures 05/31/2023 0456 by Salome Spotted, RN Outcome: Progressing 05/30/2023 1949 by Salome Spotted, RN Outcome: Progressing   Problem: Health Behavior/Discharge Planning: Goal: Ability to manage health-related needs will improve 05/31/2023 0456 by Salome Spotted, RN Outcome: Progressing 05/30/2023 1949 by Salome Spotted, RN Outcome: Progressing   Problem: Clinical Measurements: Goal: Ability to maintain clinical measurements within normal limits will improve 05/31/2023 0456 by Salome Spotted, RN Outcome: Progressing 05/30/2023 1949 by Salome Spotted, RN Outcome: Progressing Goal: Will remain free from infection 05/31/2023 0456 by Salome Spotted, RN Outcome: Progressing 05/30/2023 1949 by Salome Spotted, RN Outcome: Progressing Goal: Diagnostic test results will improve 05/31/2023 0456 by Salome Spotted, RN Outcome: Progressing 05/30/2023 1949 by Salome Spotted, RN Outcome: Progressing Goal: Respiratory complications will improve 05/31/2023 0456 by Salome Spotted, RN Outcome: Progressing 05/30/2023 1949 by Salome Spotted, RN Outcome: Progressing Goal: Cardiovascular complication will be avoided 05/31/2023 0456 by Salome Spotted, RN Outcome:  Progressing 05/30/2023 1949 by Salome Spotted, RN Outcome: Progressing   Problem: Activity: Goal: Risk for activity intolerance will decrease 05/31/2023 0456 by Salome Spotted, RN Outcome: Progressing  05/30/2023 1949 by Salome Spotted, RN Outcome: Progressing   Problem: Nutrition: Goal: Adequate nutrition will be maintained 05/31/2023 0456 by Salome Spotted, RN Outcome: Progressing 05/30/2023 1949 by Salome Spotted, RN Outcome: Progressing   Problem: Coping: Goal: Level of anxiety will decrease 05/31/2023 0456 by Salome Spotted, RN Outcome: Progressing 05/30/2023 1949 by Salome Spotted, RN Outcome: Progressing   Problem: Elimination: Goal: Will not experience complications related to bowel motility 05/31/2023 0456 by Salome Spotted, RN Outcome: Progressing 05/30/2023 1949 by Salome Spotted, RN Outcome: Progressing Goal: Will not experience complications related to urinary retention 05/31/2023 0456 by Salome Spotted, RN Outcome: Progressing 05/30/2023 1949 by Salome Spotted, RN Outcome: Progressing   Problem: Pain Managment: Goal: General experience of comfort will improve 05/31/2023 0456 by Salome Spotted, RN Outcome: Progressing 05/30/2023 1949 by Salome Spotted, RN Outcome: Progressing   Problem: Safety: Goal: Ability to remain free from injury will improve 05/31/2023 0456 by Salome Spotted, RN Outcome: Progressing 05/30/2023 1949 by Salome Spotted, RN Outcome: Progressing   Problem: Skin Integrity: Goal: Risk for impaired skin integrity will decrease 05/31/2023 0456 by Salome Spotted, RN Outcome: Progressing 05/30/2023 1949 by Salome Spotted, RN Outcome: Progressing   Problem: Education: Goal: Knowledge of disease or condition will improve 05/31/2023 0456 by Salome Spotted, RN Outcome: Progressing 05/30/2023 1949 by Salome Spotted, RN Outcome: Progressing   Problem: Education: Goal: Knowledge of secondary prevention will improve (MUST DOCUMENT ALL) 05/31/2023 0456 by Salome Spotted, RN Outcome:  Progressing 05/30/2023 1949 by Salome Spotted, RN Outcome: Progressing   Problem: Education: Goal: Knowledge of patient specific risk factors will improve Loraine Leriche N/A or DELETE if not current risk factor) 05/31/2023 0456 by Salome Spotted, RN Outcome: Progressing 05/30/2023 1949 by Salome Spotted, RN Outcome: Progressing   Problem: Ischemic Stroke/TIA Tissue Perfusion: Goal: Complications of ischemic stroke/TIA will be minimized 05/31/2023 0456 by Salome Spotted, RN Outcome: Progressing 05/30/2023 1949 by Salome Spotted, RN Outcome: Progressing   Problem: Coping: Goal: Will verbalize positive feelings about self 05/31/2023 0456 by Salome Spotted, RN Outcome: Progressing 05/30/2023 1949 by Salome Spotted, RN Outcome: Progressing   Problem: Health Behavior/Discharge Planning: Goal: Ability to manage health-related needs will improve 05/31/2023 0456 by Salome Spotted, RN Outcome: Progressing 05/30/2023 1949 by Salome Spotted, RN Outcome: Progressing   Problem: Health Behavior/Discharge Planning: Goal: Goals will be collaboratively established with patient/family 05/31/2023 0456 by Salome Spotted, RN Outcome: Progressing 05/30/2023 1949 by Salome Spotted, RN Outcome: Progressing   Problem: Self-Care: Goal: Ability to communicate needs accurately will improve 05/31/2023 0456 by Salome Spotted, RN Outcome: Progressing 05/30/2023 1949 by Salome Spotted, RN Outcome: Progressing   Problem: Skin Integrity: Goal: Risk for impaired skin integrity will decrease 05/31/2023 0456 by Salome Spotted, RN Outcome: Progressing 05/30/2023 1949 by Salome Spotted, RN Outcome: Progressing   Problem: Safety: Goal: Ability to remain free from injury will improve 05/31/2023 0456 by Salome Spotted, RN Outcome: Progressing 05/30/2023 1949 by Salome Spotted, RN Outcome: Progressing   Problem: Elimination: Goal: Will not experience complications related to urinary retention 05/31/2023 0456 by Salome Spotted, RN Outcome:  Progressing 05/30/2023 1949 by Salome Spotted, RN Outcome: Progressing   Problem: Pain Managment: Goal: General experience of comfort will improve 05/31/2023 0456 by Salome Spotted, RN Outcome: Progressing 05/30/2023 1949 by Salome Spotted, RN Outcome: Progressing  Problem: Coping: Goal: Level of anxiety will decrease 05/31/2023 0456 by Salome Spotted, RN Outcome: Progressing 05/30/2023 1949 by Salome Spotted, RN Outcome: Progressing   Problem: Nutrition: Goal: Adequate nutrition will be maintained 05/31/2023 0456 by Salome Spotted, RN Outcome: Progressing 05/30/2023 1949 by Salome Spotted, RN Outcome: Progressing

## 2023-06-01 ENCOUNTER — Inpatient Hospital Stay (HOSPITAL_COMMUNITY): Payer: Medicare Other

## 2023-06-01 DIAGNOSIS — S0636AA Traumatic hemorrhage of cerebrum, unspecified, with loss of consciousness status unknown, initial encounter: Secondary | ICD-10-CM

## 2023-06-01 DIAGNOSIS — G9340 Encephalopathy, unspecified: Secondary | ICD-10-CM | POA: Diagnosis not present

## 2023-06-01 DIAGNOSIS — Z7189 Other specified counseling: Secondary | ICD-10-CM | POA: Diagnosis not present

## 2023-06-01 DIAGNOSIS — I639 Cerebral infarction, unspecified: Secondary | ICD-10-CM | POA: Diagnosis not present

## 2023-06-01 DIAGNOSIS — S12501D Unspecified nondisplaced fracture of sixth cervical vertebra, subsequent encounter for fracture with routine healing: Secondary | ICD-10-CM

## 2023-06-01 LAB — COMPREHENSIVE METABOLIC PANEL
ALT: 434 U/L — ABNORMAL HIGH (ref 0–44)
AST: 414 U/L — ABNORMAL HIGH (ref 15–41)
Albumin: 1.9 g/dL — ABNORMAL LOW (ref 3.5–5.0)
Alkaline Phosphatase: 102 U/L (ref 38–126)
Anion gap: 14 (ref 5–15)
BUN: 21 mg/dL (ref 8–23)
CO2: 23 mmol/L (ref 22–32)
Calcium: 8.6 mg/dL — ABNORMAL LOW (ref 8.9–10.3)
Chloride: 102 mmol/L (ref 98–111)
Creatinine, Ser: 2.19 mg/dL — ABNORMAL HIGH (ref 0.61–1.24)
GFR, Estimated: 29 mL/min — ABNORMAL LOW (ref >60)
Glucose, Bld: 123 mg/dL — ABNORMAL HIGH (ref 70–99)
Potassium: 3.6 mmol/L (ref 3.5–5.1)
Sodium: 139 mmol/L (ref 135–145)
Total Bilirubin: 0.8 mg/dL (ref 0.3–1.2)
Total Protein: 5.7 g/dL — ABNORMAL LOW (ref 6.5–8.1)

## 2023-06-01 LAB — CBC WITH DIFFERENTIAL/PLATELET
Abs Immature Granulocytes: 0.05 10*3/uL (ref 0.00–0.07)
Basophils Absolute: 0 10*3/uL (ref 0.0–0.1)
Basophils Relative: 0 %
Eosinophils Absolute: 0 10*3/uL (ref 0.0–0.5)
Eosinophils Relative: 0 %
HCT: 34.8 % — ABNORMAL LOW (ref 39.0–52.0)
Hemoglobin: 11.9 g/dL — ABNORMAL LOW (ref 13.0–17.0)
Immature Granulocytes: 1 %
Lymphocytes Relative: 8 %
Lymphs Abs: 0.9 10*3/uL (ref 0.7–4.0)
MCH: 26.8 pg (ref 26.0–34.0)
MCHC: 34.2 g/dL (ref 30.0–36.0)
MCV: 78.4 fL — ABNORMAL LOW (ref 80.0–100.0)
Monocytes Absolute: 1.2 10*3/uL — ABNORMAL HIGH (ref 0.1–1.0)
Monocytes Relative: 11 %
Neutro Abs: 8.8 10*3/uL — ABNORMAL HIGH (ref 1.7–7.7)
Neutrophils Relative %: 80 %
Platelets: 323 10*3/uL (ref 150–400)
RBC: 4.44 MIL/uL (ref 4.22–5.81)
RDW: 15 % (ref 11.5–15.5)
WBC: 11 10*3/uL — ABNORMAL HIGH (ref 4.0–10.5)
nRBC: 0 % (ref 0.0–0.2)

## 2023-06-01 LAB — BRAIN NATRIURETIC PEPTIDE
B Natriuretic Peptide: 37.4 pg/mL (ref 0.0–100.0)
B Natriuretic Peptide: 40.8 pg/mL (ref 0.0–100.0)

## 2023-06-01 LAB — PROCALCITONIN: Procalcitonin: 0.63 ng/mL

## 2023-06-01 LAB — MAGNESIUM: Magnesium: 2.2 mg/dL (ref 1.7–2.4)

## 2023-06-01 LAB — C-REACTIVE PROTEIN: CRP: 24.8 mg/dL — ABNORMAL HIGH (ref <1.0)

## 2023-06-01 NOTE — Progress Notes (Addendum)
PROGRESS NOTE        PATIENT DETAILS Name: Eric [name] Age: 82 y.o. Sex: male Date of Birth: December 11, 1940 Admit Date: 05/22/2023 Admitting Physician Angie Fava, DO ZOX:[id number], Georgina Quint, MD  Brief Summary: Patient is a 82 y.o.  male with history of CVA, HTN-who sustained a mechanical fall at home-upon further evaluation-was found to have acute CVA and C6 vertebral body fracture.  Patient was initially evaluated at [facility] and transferred to [facility] service at [facility] for further evaluation  Significant events: 9/19>> admit to [facility]  Significant studies: 6/23>> A1c: 5.8 9/19>> CT head: 0.9 cm focus of intraparenchymal hemorrhage right frontal lobe.  Acute to subacute cortical infarct left parietal lobe. 9/19 >>CT C-spine: Acute nondisplaced fracture through the anterior inferior corner of C6 vertebral body. 9/20>> CT head: Stable 1 cm anterior right middle frontal gyrus hemorrhagic contusion versus trace subarachnoid blood. 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 9/20>> MRI brain: Acute infarct anterior right frontal lobe. 9/20>> echo: EF 55-60% 9/20>> LDL: 112 05/25/2023.  Foley catheter placed by urology 05/26/23 >> CT - 1. 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 Assessment by vascular surgery or interventional radiology is recommended. 2. Extensive wedge-shaped areas of poor enhancement of the right kidney, compatible with right renal infarction. 3. Patchy airspace consolidation within the dependent portion of the left lower lobe, concerning for pneumonia. Small focus of ground-glass opacity within the left upper lobe, which may represent an additional focus of infection. 4.  Thick-walled urinary bladder which is partially decompressed by Foley catheter. Correlate with urinalysis. 5. Patchy increased sclerosis within the T12 vertebral body, nonspecific. Consider further evaluation with nonemergent MRI of the thoracic spine. Aortic Atherosclerosis (ICD10-I70.0) and Emphysema (ICD10-J43.9).  05/27/23 - Ct head - stable, reviewed with neurologist Dr. Pearlean Brownie.   Significant microbiology data: None  Procedures: None  Consults: Neurology, urology, Pall care  Subjective: Patient in bed, appears comfortable, denies any headache, no fever, no chest pain or pressure, no shortness of breath , no abdominal pain. No focal weakness.   Objective: Vitals: Blood pressure (!) 143/66, pulse 80, temperature 97.9 F (36.6 C), temperature source Axillary, resp. rate 18, height 5\' 8"  (1.727 m), weight 59 kg, SpO2 97%.   Exam:  Frail elderly AA male, Awake Alert, No new F.N deficits, soft c-collar in place, Foley catheter in place [place].AT,PERRAL Supple Neck, No JVD,   Symmetrical Chest wall movement, Good air movement bilaterally, CTAB RRR,No Gallops, Rubs or new Murmurs,  +ve B.Sounds, Abd Soft,   No Cyanosis, Clubbing or edema    Assessment/Plan:  Acute right frontal lobe CVA some questionable hemorrhagic contusion on CT not confirmed on MRI.  Neurology on board, now on Eliquis and statin, aspirin stopped, stable LDL, echo and recent A1c, had mild left-sided weakness and dysarthria.  Full stroke workup done, defer further recommendations to the neurology team.   C6 vertebral body fracture  CT and MRI although limited reviewed, seen by neurosurgery, hard c-collar removed by neurosurgery on 05/24/2022, PT OT, has subjectives comfort hence placed a soft c-collar on 05/27/2023.  Pain regimen adjusted for better pain control.  HTN more than  4 days out of CVA gradually reintroduce blood pressure medications  Hypokalemia.  Replace.    Asymptomatic transaminitis.  Could be due to  Crestor, hold further statin, INR stable when accounted for heparin, acute hepatitis panel and right upper quadrant ultrasound stable.  Trend.  HLD  Resume Crestor-increase to 40 mg daily  BPH - Proscar but developed urinary retention, urology was called Foley catheter was placed by Dr. Alvester Morin on 05/25/2023.   Nonspecific abdominal pain and sweats morning of 05/26/2023.  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 Pearlean Brownie.  He was placed on heparin drip eventually on Eliquis, clinically better.  Head CT repeated to monitor the hemorrhagic conversion's.  Discussed with patient's son and daughter in detail.  May benefit from TEE once he is more stronger in the future.  Patchy increased sclerosis within the T12 vertebral body, nonspecific. Outpatient further evaluation with nonemergent MRI of the thoracic spine.  Possible aspiration pneumonia.  Unasyn and monitor.  Speech following.  Hypokalemia.  Replace.  Overall cachexia, failure to thrive, poor oral intake ongoing for several months, Remeron added for appetite stimulation, palliative care for goals of care for long-term, now family wants to pursue full code and full scope of treatment.    Underweight: Estimated body mass index is 19.78 kg/m as calculated from the following:   Height as of this encounter: 5\' 8"  (1.727 m).   Weight as of this encounter: 59 kg.   Code status:   Code Status: Full Code   DVT Prophylaxis: SCDs Start: 05/22/23 2315 apixaban (ELIQUIS) tablet 5 mg   Family Communication: Son and wife at bedside 05/25/2023, son bedside 05/26/23, 05/27/2023, daughter [name] [phone] 05/26/23, 05/27/2023, son bedside 05/28/2023, 05/29/2023  Daughter bedside 06/01/2023.  Very thankful for care.   Disposition Plan: Status is: Inpatient Remains inpatient appropriate because: Severity of illness   Planned Discharge  Destination:Home health   Diet: Diet Order             DIET DYS 2 Room service appropriate? No; Fluid consistency: Thin  Diet effective now                     Antimicrobial agents: Anti-infectives (From admission, onward)    Start     Dose/Rate Route Frequency Ordered Stop   05/26/23 1430  Ampicillin-Sulbactam (UNASYN) 3 g in sodium chloride 0.9 % 100 mL IVPB        3 g 200 mL/hr over 30 Minutes Intravenous Every 12 hours 05/26/23 1342 06/01/23 0824        MEDICATIONS: Scheduled Meds:  apixaban  5 mg Oral BID   carvedilol  6.25 mg Oral BID WC   Chlorhexidine Gluconate Cloth  6 each Topical Daily   finasteride  5 mg Oral Daily   lidocaine  1 Application Urethral Once   mirtazapine  15 mg Oral QHS   [START ON 06/05/2023] rosuvastatin  10 mg Oral Daily   tamsulosin  0.4 mg Oral Daily   Continuous Infusions:    PRN Meds:.acetaminophen **OR** acetaminophen, hydrALAZINE, HYDROcodone-acetaminophen, labetalol, melatonin, morphine injection, naLOXone (NARCAN)  injection, ondansetron (ZOFRAN) IV, polyethylene glycol   I have personally reviewed following labs and imaging studies  LABORATORY DATA:  Recent Labs  Lab 05/27/23 0450 05/28/23 0329 05/29/23 0624 05/30/23 0609 05/31/23 0721  WBC 22.8* 22.1* 22.9* 17.8* 14.1*  HGB 14.4 12.9* 13.9 14.7 13.3  HCT 41.5 38.6* 40.6 43.0 38.4*  PLT 264 218 215 262 293  MCV 81.1 79.9* 79.1* 79.8* 77.9*  MCH 28.1 26.7 27.1 27.3 27.0  MCHC 34.7 33.4 34.2 34.2 34.6  RDW 14.9 14.7 14.8 14.8 14.7  LYMPHSABS 0.8 0.9 0.9 0.7 0.9  MONOABS 1.4* 1.3* 1.6* 1.2* 1.4*  EOSABS 0.1 0.0 0.1 0.0 0.0  BASOSABS 0.0 0.0 0.0 0.0 0.0    Recent Labs  Lab 05/25/23 1557 05/25/23 1557 05/26/23 0931 05/27/23 0450 05/28/23 0329 05/29/23 0624 05/30/23 0609 05/30/23 1746 05/31/23 0721  NA 136  --  138 135 136 136 137  --  138  K 4.0  --  4.4 3.6 3.1* 3.6 3.1*  --  3.6  CL 104  --  104 103 102 101 101  --  105  CO2 18*  --  17* 19* 20*  17* 21*  --  22  ANIONGAP 14  --  17* 13 14 18* 15  --  11  GLUCOSE 125*  --  94 96 78 98 144*  --  109*  BUN 20  --  24* 24* 19 16 14   --  15  CREATININE 1.46*  --  1.88* 1.92* 1.83* 1.97* 1.93*  --  1.98*  AST  --    < > 53* 87* 71* 75* 145*  --  317*  ALT  --    < > 36 59* 56* 71* 145*  --  306*  ALKPHOS  --    < > 54 53 53 57 73  --  79  BILITOT  --    < > 1.3* 0.9 0.9 1.3* 0.8  --  0.8  ALBUMIN  --    < > 3.7 3.4* 2.8* 2.6* 2.6*  --  2.2*  CRP  --   --  4.4* 3.8* 17.2* 24.0* 25.0*  --   --   PROCALCITON  --    < > 0.36 0.33 0.55 0.64 0.52  --  0.41  INR  --   --   --   --   --   --   --  1.5*  --   HGBA1C  --   --  6.3*  --   --   --   --   --   --   MG 1.8  --   --  1.9 1.9 1.9 2.0  --   --   CALCIUM 9.0  --  9.8 9.0 8.8* 8.8* 9.1  --  8.7*   < > = values in this interval not displayed.    Lab Results  Component Value Date   CHOL 116 05/23/2023   HDL 65 05/23/2023   LDLCALC 39 05/23/2023   LDLDIRECT 163.3 05/26/2008   TRIG 59 05/23/2023   CHOLHDL 1.8 05/23/2023   Lab Results  Component Value Date   HGBA1C 6.3 (H) 05/26/2023      Recent Labs  Lab 05/25/23 1557 05/25/23 1557 05/26/23 0931 05/27/23 0450 05/28/23 0329 05/29/23 0624 05/30/23 0609 05/30/23 1746 05/31/23 0721  CRP  --   --  4.4* 3.8* 17.2* 24.0* 25.0*  --   --   PROCALCITON  --    < > 0.36 0.33 0.55 0.64 0.52  --  0.41  INR  --   --   --   --   --   --   --  1.5*  --   HGBA1C  --   --  6.3*  --   --   --   --   --   --  MG 1.8  --   --  1.9 1.9 1.9 2.0  --   --   CALCIUM 9.0  --  9.8 9.0 8.8* 8.8* 9.1  --  8.7*   < > = values in this interval not displayed.    RADIOLOGY STUDIES/RESULTS: DG Chest Port 1 View  Result Date: 06/01/2023 CLINICAL DATA:  Shortness of breath EXAM: PORTABLE CHEST 1 VIEW COMPARISON:  05/28/2023 FINDINGS: Heart size and mediastinal contours are normal. Increased opacity at the left base which partially obscures the left hemidiaphragm. Right lung appears clear. Visualized  osseous structures are unremarkable. IMPRESSION: Persistent and slightly progressive opacity within the left base which now obscures the left hemidiaphragm. Imaging findings compatible with atelectasis and/or consolidative change. Electronically Signed   By: Signa Kell M.D.   On: 06/01/2023 07:14   US Abdomen Limited RUQ (LIVER/GB)  Result Date: 05/30/2023 CLINICAL DATA:  Elevated liver enzymes. EXAM: ULTRASOUND ABDOMEN LIMITED RIGHT UPPER QUADRANT COMPARISON:  None Available. FINDINGS: Gallbladder: No gallstones or wall thickening visualized. No sonographic Murphy sign noted by sonographer. Common bile duct: The common bile duct is poorly visualized. Liver: The visualized liver is unremarkable. The left lobe of the liver is fluid is visualized and obscured by bowel gas. Portal vein is patent on color Doppler imaging with normal direction of blood flow towards the liver. Other: None. IMPRESSION: Unremarkable visualized right upper quadrant ultrasound. Electronically Signed   By: Elgie Collard M.D.   On: 05/30/2023 18:20     LOS: 10 days   Signature  -    Susa Raring M.D on 06/01/2023 at 10:56 AM   -  To page go to [email or web address]

## 2023-06-01 NOTE — Progress Notes (Signed)
Daily Progress Note   Patient Name: Eric [name]       Date: 06/01/2023 DOB: 1940/11/23  Age: 82 y.o. MRN#: [id number] Attending Physician: Leroy Sea, MD Primary Care Physician: Tresa Garter, MD Admit Date: 05/22/2023  Reason for Consultation/Follow-up: Establishing goals of care  Subjective: I have reviewed medical records including EPIC notes, MAR, and labs/imaging.  Patient assessed at the bedside. He is feeling hot, family assisting with cooling him. Wife, daughter, and son present visiting. Discussed with RN.  Created space and opportunity for family's thoughts and feelings on patient's current illness. They do not have any follow up questions or concerns after yesterday's meeting. They are satisfied with nursing care today.   Revisited CODE STATUS conversation and family clearly states that they are open to aggressive measures such as CPR and mechanical ventilation. They only limitation they have set on patient's care is NO feeding tube.   Encouraged to call with questions and/or concerns. PMT card previously provided.  Length of Stay: 10  Physical Exam Vitals and nursing note reviewed.  Constitutional:      General: He is not in acute distress.    Appearance: He is ill-appearing.  Cardiovascular:     Rate and Rhythm: Normal rate.  Pulmonary:     Effort: No respiratory distress.  Skin:    General: Skin is warm and dry.  Neurological:     Mental Status: He is alert. He is confused.  Psychiatric:        Cognition and Memory: Cognition is impaired.             Vital Signs: BP (!) 143/66   Pulse 80   Temp 97.9 F (36.6 C) (Axillary)   Resp 18   Ht 5\' 8"  (1.727 m)   Wt 59 kg   SpO2 97%   BMI 19.78 kg/m  SpO2: SpO2: 97 % O2 Device: O2 Device: Room  Air O2 Flow Rate: O2 Flow Rate (L/min): 2 L/min       Palliative Assessment/Data: PPS 20%    Palliative Care Assessment & Plan   Patient Profile: 82 y.o. male  with past medical history of stroke, essential pretension, who was admitted to [facility] on 05/22/2023 with suspected acute versus subacute ischemic stroke after presenting from home to [facility] ED for evaluation  of fall.   Assessment: Principal Problem:   Acute ischemic stroke [facility]) Active Problems:   Essential hypertension   Acute intracranial hemorrhage (HCC)   Acute encephalopathy   C6 cervical fracture (HCC)   Elevated troponin   BPH (benign prostatic hyperplasia)   Concern about end of life  Recommendations/Plan: Goals care clear for full code/full scope treatment Patient's family are very hopeful for improvement, wish to encourage oral intake and mobility Previously provided with office of patient experience contact information Psychosocial and emotional support provided Ongoing goals of care discussions pending clinical course PMT will continue to follow and support holistically  Goals of Care and Additional Recommendations: Limitations on Scope of Treatment: Full Scope Treatment  Prognosis:  If continues with minimal oral intake, poor  Discharge Planning: To Be Determined  Care plan was discussed with primary RN, wife, son, daughter   Total time: I spent 35 minutes in the care of the patient today in the above activities and documenting the encounter.   Richardson Dopp, PA-C Palliative Medicine Team Team phone # [phone]  Thank you for allowing the Palliative Medicine Team to assist in the care of this patient. Please utilize secure chat with additional questions, if there is no response within 30 minutes please call the above phone number.  Palliative Medicine Team providers are available by phone from 7am to 7pm daily and can be reached through the team cell phone.  Should  this patient require assistance outside of these hours, please call the patient's attending physician.  Portions of this note are a verbal dictation therefore any spelling and/or grammatical errors are due to the "[employer] One" system interpretation.

## 2023-06-02 DIAGNOSIS — I639 Cerebral infarction, unspecified: Secondary | ICD-10-CM | POA: Diagnosis not present

## 2023-06-02 DIAGNOSIS — K719 Toxic liver disease, unspecified: Secondary | ICD-10-CM | POA: Diagnosis not present

## 2023-06-02 DIAGNOSIS — R748 Abnormal levels of other serum enzymes: Secondary | ICD-10-CM | POA: Diagnosis not present

## 2023-06-02 DIAGNOSIS — R638 Other symptoms and signs concerning food and fluid intake: Secondary | ICD-10-CM

## 2023-06-02 DIAGNOSIS — S12501A Unspecified nondisplaced fracture of sixth cervical vertebra, initial encounter for closed fracture: Secondary | ICD-10-CM | POA: Diagnosis not present

## 2023-06-02 DIAGNOSIS — W19XXXA Unspecified fall, initial encounter: Secondary | ICD-10-CM | POA: Diagnosis not present

## 2023-06-02 DIAGNOSIS — S0636AA Traumatic hemorrhage of cerebrum, unspecified, with loss of consciousness status unknown, initial encounter: Secondary | ICD-10-CM | POA: Diagnosis not present

## 2023-06-02 DIAGNOSIS — I629 Nontraumatic intracranial hemorrhage, unspecified: Secondary | ICD-10-CM | POA: Diagnosis not present

## 2023-06-02 LAB — AMMONIA: Ammonia: 17 umol/L (ref 9–35)

## 2023-06-02 LAB — COMPREHENSIVE METABOLIC PANEL
ALT: 518 U/L — ABNORMAL HIGH (ref 0–44)
AST: 521 U/L — ABNORMAL HIGH (ref 15–41)
Albumin: 2 g/dL — ABNORMAL LOW (ref 3.5–5.0)
Alkaline Phosphatase: 135 U/L — ABNORMAL HIGH (ref 38–126)
Anion gap: 13 (ref 5–15)
BUN: 23 mg/dL (ref 8–23)
CO2: 21 mmol/L — ABNORMAL LOW (ref 22–32)
Calcium: 8.7 mg/dL — ABNORMAL LOW (ref 8.9–10.3)
Chloride: 106 mmol/L (ref 98–111)
Creatinine, Ser: 2.47 mg/dL — ABNORMAL HIGH (ref 0.61–1.24)
GFR, Estimated: 25 mL/min — ABNORMAL LOW (ref >60)
Glucose, Bld: 96 mg/dL (ref 70–99)
Potassium: 3.7 mmol/L (ref 3.5–5.1)
Sodium: 140 mmol/L (ref 135–145)
Total Bilirubin: 0.9 mg/dL (ref 0.3–1.2)
Total Protein: 6.2 g/dL — ABNORMAL LOW (ref 6.5–8.1)

## 2023-06-02 LAB — CBC WITH DIFFERENTIAL/PLATELET
Abs Immature Granulocytes: 0.04 10*3/uL (ref 0.00–0.07)
Basophils Absolute: 0 10*3/uL (ref 0.0–0.1)
Basophils Relative: 0 %
Eosinophils Absolute: 0 10*3/uL (ref 0.0–0.5)
Eosinophils Relative: 0 %
HCT: 38 % — ABNORMAL LOW (ref 39.0–52.0)
Hemoglobin: 13.2 g/dL (ref 13.0–17.0)
Immature Granulocytes: 0 %
Lymphocytes Relative: 4 %
Lymphs Abs: 0.5 10*3/uL — ABNORMAL LOW (ref 0.7–4.0)
MCH: 27.8 pg (ref 26.0–34.0)
MCHC: 34.7 g/dL (ref 30.0–36.0)
MCV: 80 fL (ref 80.0–100.0)
Monocytes Absolute: 0.7 10*3/uL (ref 0.1–1.0)
Monocytes Relative: 7 %
Neutro Abs: 9.1 10*3/uL — ABNORMAL HIGH (ref 1.7–7.7)
Neutrophils Relative %: 89 %
Platelets: 363 10*3/uL (ref 150–400)
RBC: 4.75 MIL/uL (ref 4.22–5.81)
RDW: 14.7 % (ref 11.5–15.5)
WBC: 10.4 10*3/uL (ref 4.0–10.5)
nRBC: 0 % (ref 0.0–0.2)

## 2023-06-02 LAB — C-REACTIVE PROTEIN: CRP: 24.9 mg/dL — ABNORMAL HIGH (ref <1.0)

## 2023-06-02 LAB — PROCALCITONIN: Procalcitonin: 0.64 ng/mL

## 2023-06-02 LAB — PROTIME-INR
INR: 1.9 — ABNORMAL HIGH (ref 0.8–1.2)
Prothrombin Time: 22.3 s — ABNORMAL HIGH (ref 11.4–15.2)

## 2023-06-02 LAB — MAGNESIUM: Magnesium: 2.1 mg/dL (ref 1.7–2.4)

## 2023-06-02 LAB — BRAIN NATRIURETIC PEPTIDE: B Natriuretic Peptide: 30.5 pg/mL (ref 0.0–100.0)

## 2023-06-02 LAB — CK: Total CK: 59 U/L (ref 49–397)

## 2023-06-02 NOTE — Progress Notes (Addendum)
Speech Language Pathology Treatment: Dysphagia  Patient Details Name: Eric [name] MRN: [id number] DOB: 1941/06/09 Today's Date: 06/02/2023 Time: 1110-1200 SLP Time Calculation (min) (ACUTE ONLY): 50 min  Assessment / Plan / Recommendation Clinical Impression  Pt alert but confused, his son is at bedside. He reports pt has been coughing throughout admission, wet cough with expectoration of mucus, which is worse when he's being fed. Reports pt was having trouble prior to admission, limited intake, asking for Tums, often refusing meals, choking on solids requiring assist from son, expectorating mucus with meals. Mild recent cognitive decline. Problem became much more severe after admission. The pt has barely eaten and needs frequent suctioning. Suction cannister is full of secretions. RN reports that meds that have been given are suctioned back out when pt coughs. Son reports that his goal if for the pt to eat and get stronger, sit in a chair and rehabilitate.  He wants to find a way for him not to have all this wet coughing and need for suction.   Pt has his head turned left and will not allow neck to be repositioned. CXR worse on left per recent imaging. When offered sips pt needs maximal verbal and tactile cueing to accept the bolus and then exhibits immediate and delayed wet coughing. He does not sufficiently accept a puree bolus.   SLP directly told son that coughing when swallowing is indicative of pt aspirating; described the signs observed and the hypothesis that the pts fall and stroke may have exacerbated a baseline problem, though its impossible to know for certain. Though SLP also explained a suspicion for poor prognosis to rehabilitate the swallow given severity and minimal intake over the course of admission, also offered instrumental testing to help [place] comprehend the "why" for the pts dysphagia. Further cautioned the son that there is a high risk of the pt getting sick and having  trouble breathing if he continues to aspirate. The son confirms no wish for any feeding tube, that the pt would not tolerate it, so sips and med administration to continue. SLP will plan on MBS tomorrow with son present to discuss.    HPI HPI: 82 y.o. male presents to [facility] hospital on 05/23/2023 as a transfer from [facility] ED after a fall. CT head suggestive of acute to subacute L parietal infarct, as well as IPH in R frontal lobe. Pt also found to have C6 vertebral body fx. PMH includes CVA, HTN. Pt is mild-moderately dysarthric      SLP Plan  Continue with current plan of care      Recommendations for follow up therapy are one component of a multi-disciplinary discharge planning process, led by the attending physician.  Recommendations may be updated based on patient status, additional functional criteria and insurance authorization.    Recommendations  Diet recommendations: Other(comment) (sips with known risk) Liquids provided via: Straw;Cup;Teaspoon Medication Administration: Crushed with puree Supervision: Full supervision/cueing for compensatory strategies Compensations: Slow rate;Small sips/bites;Minimize environmental distractions Postural Changes and/or Swallow Maneuvers: Seated upright 90 degrees                  Oral care BID   Frequent or constant Supervision/Assistance Dysphagia, unspecified (R13.10)     Continue with current plan of care     Margee Trentham, Riley Nearing  06/02/2023, 1:00 PM

## 2023-06-02 NOTE — Consult Note (Signed)
Consultation  Referring Provider:   [facility] Primary Care Physician:  Tresa Garter, MD Primary Gastroenterologist:  Dr. Christella Hartigan       Reason for Consultation:     Elevated LFTs DOA: 05/22/2023         Hospital Day: 12         HPI:   Eric [name] is a 82 y.o. male with past medical history significant for hypertension, CVA, COPD, prediabetes, hyperlipidemia on Crestor.   Presents to the ER 9/19 for AMS and evaluation after mechanical fall. CT head showed acute to subacute cortical infarct in left parietal lobe new from June 20 10/2020.  Not a candidate for tPA or thrombectomy secondary to symptoms greater than 72 hours with confusion and paresthesias and small intracranial bleed from fall. CT cervical neck findings of C6 vertebral body fracture. 9/20 MRI brain acute i anterior right frontal lobe infarct Echo 55 to 60%  GI consulted 9/30 elevated transaminitis 05/23/2023 AST 33 with steady progression starting on 09/23 with AST 53 currently AST 521 05/23/2023 ALT 27 steady progression on 09/24 ALT 59 currently ALT 518 9/20 alk phos 57, this morning 9/30 alkaline phosphatase at 135 9/28 total bilirubin 1.7, currently at 0.9 9/20 INR 1.7, 9/27 INR 1.5 but while on Eliquis. 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  Small focus of groundglass opacity left upper lobe may represent additional focus of infection, thick-walled urinary bladder, patchy increased sclerosis within T12 vertebral body nonspecific. showed 9 mm  right hepatic lobe delayed enhancement is likely benign hemangioma otherwise no suspicious hepatic abnormality unremarkable gallbladder normal spleen, pancreas 09/27 Right upper quadrant ultrasound portal veins patent, no gallstones normal CBD, normal liver, left lobe obscured by bowel gas.   Patient with family at bedside, son Eric [name]. Provided all of the history.  Send Eric [name] states patient lives with his wife, Eric [name] and his brother Eric [name] will stay over and check in on them.  He has had confusion starting since a week or 2 ago getting progressively worse until he had the fall. Patient states he had did have some improvement but has had worsening confusion today.  Currently patient states he is at home very lethargic and unable to answer questions. Son denies any family history of liver issues, no liver issues that he knows of for his father. Remote history of alcohol use 20 to 30 years ago, remote history of smoking 10 to 20 years ago, no history of drug use. Son denies any recent medication started other than multivitamin may have been started on Crestor outpatient but uncertain if his dad was actually taking it. Prior to admission he denies any fever, chills, abdominal pain, sick contacts, yellowing of skin of eyes, abnormal bowel movements. No bowel movements recorded since being here.   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 Family History:  His family history includes Arthritis in his mother; Diabetes in his father and another family member; Hypertension in an other family member. Social History:   reports that  he quit smoking about 23 years ago. His smoking use included cigarettes. He has never used smokeless tobacco. He reports that he does not drink alcohol and does not use drugs.  Prior to Admission medications   Medication Sig Start Date End Date Taking? Authorizing Provider  acetaminophen (TYLENOL) 650 MG CR tablet Take 650 mg by mouth at bedtime as needed (for back pain). For arthritis in back   Yes [provider]  amLODipine-benazepril (LOTREL) 5-20 MG capsule TAKE 1 CAPSULE BY MOUTH EVERY DAY Patient taking differently: Take 1 capsule by mouth at bedtime. 10/28/22  Yes Plotnikov, Georgina Quint, MD  BAYER LOW DOSE 81 MG tablet Take 81 mg by mouth in the morning. Swallow whole.   Yes [provider]  Cyanocobalamin (VITAMIN B-12) 1000 MCG SUBL Place 1 tablet (1,000 mcg total) under the tongue daily. Patient taking differently: Place 1 tablet under the tongue at bedtime. 04/24/21  Yes Plotnikov, Georgina Quint, MD  ferrous sulfate 325 (65 FE) MG tablet Take 1 tablet (325 mg total) by mouth 2 (two) times daily with a meal. Patient taking differently: Take 325 mg by mouth at bedtime. 12/29/22 12/29/23 Yes Plotnikov, Georgina Quint, MD  finasteride (PROSCAR) 5 MG tablet Take 1 tablet (5 mg total) by mouth daily. 06/26/22  Yes Plotnikov, Georgina Quint, MD  oxybutynin (DITROPAN-XL) 5 MG 24 hr tablet TAKE 1 TABLET (5 MG TOTAL) BY MOUTH AT BEDTIME. FOR FREQUENT URINATION 05/14/23  Yes Plotnikov, Georgina Quint, MD  potassium chloride SA (KLOR-CON M) 20 MEQ tablet Take 1 tablet (20 mEq total) by mouth daily. 04/18/23  Yes Plotnikov, Georgina Quint, MD  rosuvastatin (CRESTOR) 20 MG tablet Take 1 tablet (20 mg total) by mouth daily. 02/25/23  Yes Rai, Ripudeep K, MD  traMADol (ULTRAM) 50 MG tablet TAKE 1 TABLET 3 TIMES A DAY AS NEEDED SEVERE PAIN Patient taking differently: Take 50 mg by mouth 3 (three) times daily. 02/14/23  Yes Plotnikov, Georgina Quint, MD  aspirin EC 81 MG tablet Take 1 tablet (81 mg total) by mouth daily.  Swallow whole. Patient not taking: Reported on 05/22/2023 02/25/23   Rai, Delene Ruffini, MD  pantoprazole (PROTONIX) 40 MG tablet Take 1 tablet (40 mg total) by mouth daily. 02/24/23   Cathren Harsh, MD    Current Facility-Administered Medications  Medication Dose Route Frequency Provider Last Rate Last Admin   acetaminophen (TYLENOL) tablet 650 mg  650 mg Oral Q6H PRN Howerter, Justin B, DO   650 mg at 05/26/23 0857   Or   acetaminophen (TYLENOL) suppository 650 mg  650 mg Rectal Q6H PRN Howerter, Justin B, DO       apixaban (ELIQUIS) tablet 5 mg  5 mg Oral BID Reome, Earle J, RPH   5 mg at 06/01/23 2131   carvedilol (COREG) tablet 6.25 mg  6.25 mg Oral BID WC Leroy Sea, MD   6.25 mg at 06/01/23 1529   Chlorhexidine Gluconate Cloth 2 % PADS 6 each  6 each Topical Daily Leroy Sea, MD   6 each at 06/02/23 0920   finasteride (PROSCAR) tablet 5 mg  5 mg Oral Daily Ghimire,  Werner Lean, MD   5 mg at 06/01/23 0824   hydrALAZINE (APRESOLINE) injection 10 mg  10 mg Intravenous Q6H PRN Leroy Sea, MD   10 mg at 06/01/23 2149   HYDROcodone-acetaminophen (NORCO/VICODIN) 5-325 MG per tablet 1 tablet  1 tablet Oral Q6H PRN Leroy Sea, MD   1 tablet at 06/01/23 2130   labetalol (NORMODYNE) injection 5 mg  5 mg Intravenous Q2H PRN Dow Adolph N, DO   5 mg at 05/27/23 1225   melatonin tablet 3 mg  3 mg Oral QHS PRN Howerter, Justin B, DO   3 mg at 05/25/23 2031   mirtazapine (REMERON SOL-TAB) disintegrating tablet 15 mg  15 mg Oral QHS Leroy Sea, MD   15 mg at 06/01/23 2131   morphine (PF) 2 MG/ML injection 2 mg  2 mg Intravenous Q8H PRN Leroy Sea, MD   2 mg at 06/02/23 0900   naloxone (NARCAN) injection 0.4 mg  0.4 mg Intravenous PRN John Giovanni, MD       ondansetron [facility]) injection 4 mg  4 mg Intravenous Q6H PRN Howerter, Justin B, DO       polyethylene glycol (MIRALAX / GLYCOLAX) packet 17 g  17 g Oral Daily PRN Dow Adolph N, DO   17 g at 05/31/23 1505    [START ON 06/05/2023] rosuvastatin (CRESTOR) tablet 10 mg  10 mg Oral Daily Leroy Sea, MD       tamsulosin [facility]) capsule 0.4 mg  0.4 mg Oral Daily Ray Church III, MD   0.4 mg at 06/01/23 1610    Allergies as of 05/22/2023 - Review Complete 05/22/2023  Allergen Reaction Noted   Hctz [hydrochlorothiazide] Other (See Comments) 08/16/2016    Review of Systems:    Constitutional: No weight loss, fever, chills, weakness or fatigue HEENT: Eyes: No change in vision               Ears, Nose, Throat:  No change in hearing or congestion Skin: No rash or itching Cardiovascular: No chest pain, chest pressure or palpitations   Respiratory: No SOB or cough Gastrointestinal: See HPI and otherwise negative Genitourinary: No dysuria or change in urinary frequency Neurological: No headache, dizziness or syncope Musculoskeletal: No new muscle or joint pain Hematologic: No bleeding or bruising Psychiatric: No history of depression or anxiety     Physical Exam:  Vital signs in last 24 hours: Temp:  [97.9 F (36.6 C)-98.6 F (37 C)] 98.6 F (37 C) (09/30 0300) Pulse Rate:  [72-88] 83 (09/30 0300) Resp:  [18-27] 20 (09/30 0300) BP: (112-157)/(57-77) 112/57 (09/30 0300) SpO2:  [89 %-99 %] 99 % (09/30 0300) Last BM Date : 05/26/23 Last BM recorded by nurses in past 5 days No data recorded  General:   Chronically ill-appearing AA male Head: Soft collar in place Heart:  regular rate and rhythm, no murmurs or gallops Pulm: Clear anteriorly; no wheezing Abdomen:  Soft, thin AB, Sluggish bowel sounds. No tenderness , No organomegaly appreciated. Extremities:  Without edema. Msk:  Symmetrical without gross deformities. Peripheral pulses intact.  Neurologic:  Alert and  oriented x1, oriented to self only at this time; dysarthria, very lethargic Skin: Ecchymoses bilateral arms Psychiatric:  Cooperative.  Lethargic  LAB RESULTS: Recent Labs    05/31/23 0721 06/01/23 1145  06/02/23 0319  WBC 14.1* 11.0* 10.4  HGB 13.3 11.9* 13.2  HCT 38.4* 34.8* 38.0*  PLT 293 323 363   BMET Recent Labs  05/31/23 0721 06/01/23 1145 06/02/23 0319  NA 138 139 140  K 3.6 3.6 3.7  CL 105 102 106  CO2 22 23 21*  GLUCOSE 109* 123* 96  BUN 15 21 23   CREATININE 1.98* 2.19* 2.47*  CALCIUM 8.7* 8.6* 8.7*   LFT Recent Labs    06/02/23 0319  PROT 6.2*  ALBUMIN 2.0*  AST 521*  ALT 518*  ALKPHOS 135*  BILITOT 0.9   PT/INR Recent Labs    05/30/23 1746  LABPROT 18.3*  INR 1.5*    STUDIES: DG Chest Port 1 View  Result Date: 06/01/2023 CLINICAL DATA:  Shortness of breath EXAM: PORTABLE CHEST 1 VIEW COMPARISON:  05/28/2023 FINDINGS: Heart size and mediastinal contours are normal. Increased opacity at the left base which partially obscures the left hemidiaphragm. Right lung appears clear. Visualized osseous structures are unremarkable. IMPRESSION: Persistent and slightly progressive opacity within the left base which now obscures the left hemidiaphragm. Imaging findings compatible with atelectasis and/or consolidative change. Electronically Signed   By: Signa Kell M.D.   On: 06/01/2023 07:14      Impression    Elevated LFTs Pattern compatible with hepatocellular injury, elevated LFTs starting 9/23 Hepatocellular most likely related to [facility] Recent Labs  Lab 05/29/23 0624 05/30/23 0609 05/31/23 0721 06/01/23 1145 06/02/23 0319  AST 75* 145* 317* 414* 521*  ALT 71* 145* 306* 434* 518*  ALKPHOS 57 73 79 102 135*  BILITOT 1.3* 0.8 0.8 0.8 0.9  PROT 6.2* 6.7 6.1* 5.7* 6.2*  ALBUMIN 2.6* 2.6* 2.2* 1.9* 2.0*   AST 521 ALT 518 Alkphos 135 TBili 0.9 05/30/2023 INR 1.5, Eliquis started 05/30/2023 Right upper quadrant abdominal ultrasound 9/27 unremarkable, no evidence of biliary obstruction Crestor 40 mg 9/21  last dose 05/31/2023 Unasyn started 09/23, last dose 09/29 completed Remeron started 09/24 but LFTs had all ready started to increase modestly.  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 Vascular surgery consulted On Eliquis  Possible aspiration pneumonia On Unasyn Speech following  C6 vertebral body fracture Neurosurgery seen patient, hard c-collar removed 9/22 currently on soft c-collar 9/24  Principal Problem:   Acute ischemic stroke [facility]) Active Problems:   Essential hypertension   Acute intracranial hemorrhage (HCC)   Acute encephalopathy   C6 cervical fracture (HCC)   Elevated troponin   BPH (benign prostatic hyperplasia)    LOS: 11 days     Plan   With hepatocellular injury, normal right upper quadrant ultrasound, negative hepatitis panel most likely this represents daily secondary to Crestor Unasyn which have been discontinued.  Patient started on Remeron low-dose but LFTs were starting prior to this.  Can still consider switching medication or stopping if LFTs continue to climb. No prodrome/fevers or chills before coming in but patient was having confusion, positive UTI can consider HSV, CMV, EBV however this is less likely Less likely autoimmune but can check AMA, ASMA Negative acute hepatitis panel.   Will recheck INR and ammonia level Will check CPK -Trend LFTs/INR.  -Follow serologies for autoimmune hepatitis and metabolic liver disease. -Avoid hepatotoxic medications -Agree with palliative care consult   Thank you for your kind  consultation, we will continue to follow.   Doree Albee  06/02/2023, 10:22 AM

## 2023-06-02 NOTE — Progress Notes (Signed)
PROGRESS NOTE        PATIENT DETAILS Name: Eric [name] Age: 82 y.o. Sex: male Date of Birth: [birth year] Admit Date: 05/22/2023 Admitting Physician Angie Fava, DO ZOX:[id number], Georgina Quint, MD  Brief Summary: Patient is a 82 y.o.  male with history of CVA, HTN-who sustained a mechanical fall at home-upon further evaluation-was found to have acute CVA and C6 vertebral body fracture.  Patient was initially evaluated at [facility] and transferred to [facility] service at [facility] for further evaluation  Significant events: 9/19>> admit to [facility]  Significant studies: 6/23>> A1c: 5.8 9/19>> CT head: 0.9 cm focus of intraparenchymal hemorrhage right frontal lobe.  Acute to subacute cortical infarct left parietal lobe. 9/19 >>CT C-spine: Acute nondisplaced fracture through the anterior inferior corner of C6 vertebral body. 9/20>> CT head: Stable 1 cm anterior right middle frontal gyrus hemorrhagic contusion versus trace subarachnoid blood. 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 9/20>> MRI brain: Acute infarct anterior right frontal lobe. 9/20>> echo: EF 55-60% 9/20>> LDL: 112 05/25/2023.  Foley catheter placed by urology 05/26/23 >> CT - 1. 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 Assessment by vascular surgery or interventional radiology is recommended. 2. Extensive wedge-shaped areas of poor enhancement of the right kidney, compatible with right renal infarction. 3. Patchy airspace consolidation within the dependent portion of the left lower lobe, concerning for pneumonia. Small focus of ground-glass opacity within the left upper lobe, which may represent an additional focus of infection. 4.  Thick-walled urinary bladder which is partially decompressed by Foley catheter. Correlate with urinalysis. 5. Patchy increased sclerosis within the T12 vertebral body, nonspecific. Consider further evaluation with nonemergent MRI of the thoracic spine. Aortic Atherosclerosis (ICD10-I70.0) and Emphysema (ICD10-J43.9).  05/27/23 - Ct head - stable, reviewed with neurologist Dr. Pearlean Brownie.   Significant microbiology data: None  Procedures: None  Consults: Neurology, urology, Pall care, GI  Subjective: Patient in bed, appears comfortable, denies any headache, no fever, no chest pain or pressure, no shortness of breath , no abdominal pain. No new focal weakness.  Objective: Vitals: Blood pressure (!) 112/57, pulse 83, temperature 98.6 F (37 C), temperature source Oral, resp. rate 20, height 5\' 8"  (1.727 m), weight 59 kg, SpO2 99%.   Exam:  Frail elderly AA male, Awake Alert, No new F.N deficits, soft c-collar in place, Foley catheter in place [place].AT,PERRAL Supple Neck, No JVD,   Symmetrical Chest wall movement, Good air movement bilaterally, CTAB RRR,No Gallops, Rubs or new Murmurs,  +ve B.Sounds, Abd Soft,   No Cyanosis, Clubbing or edema    Assessment/Plan:  Acute right frontal lobe CVA some questionable hemorrhagic contusion on CT not confirmed on MRI.  Neurology on board, now on Eliquis and statin, aspirin stopped, stable LDL, echo and recent A1c, had mild left-sided weakness and dysarthria.  Full stroke workup done, defer further recommendations to the neurology team.   C6 vertebral body fracture  CT and MRI although limited reviewed, seen by neurosurgery, hard c-collar removed by neurosurgery on 05/24/2022, PT OT, has subjectives comfort hence placed a soft c-collar on 05/27/2023.  Pain regimen adjusted for better pain control.  HTN more  than 4 days out of CVA gradually reintroduce blood pressure medications  Hypokalemia.  Replace.    Asymptomatic transaminitis.  Could be due to  Crestor, hold further statin for 4 days, INR stable when accounted for Eliquis/heparin, acute hepatitis panel and right upper quadrant ultrasound stable, no events of hypotension, total bilirubin stable, LFTs still going high, will request GI to evaluate.  HLD  Resume Crestor-increase to 40 mg daily  BPH - Proscar but developed urinary retention, urology was called Foley catheter was placed by Dr. Alvester Morin on 05/25/2023.   Nonspecific abdominal pain and sweats morning of 05/26/2023.  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 Pearlean Brownie.  He was placed on heparin drip eventually on Eliquis, clinically better.  Head CT repeated to monitor the hemorrhagic conversion's.  Discussed with patient's son and daughter in detail.  May benefit from TEE once he is more stronger in the future.  Patchy increased sclerosis within the T12 vertebral body, nonspecific. Outpatient further evaluation with nonemergent MRI of the thoracic spine.  Possible aspiration pneumonia.  Unasyn and monitor.  Speech following.  Hypokalemia.  Replace.  Overall cachexia, failure to thrive, poor oral intake, copious oral secretions with limited ability to control them, ongoing for several months, Remeron added for appetite stimulation, required suctioning of oral accretions, palliative care for goals of care for long-term, now family wants to pursue full code and full scope of treatment.  Unfortunately family not coming to grips with patient's gradual and continued decline over several months.    Underweight: Estimated body mass index is 19.78 kg/m as calculated from the following:   Height as of this encounter: 5\' 8"  (1.727 m).   Weight as of this encounter: 59 kg.   Code status:   Code Status: Full Code   DVT Prophylaxis: SCDs Start: 05/22/23 2315 apixaban (ELIQUIS) tablet 5 mg   Family Communication: Son and wife at  bedside 05/25/2023, son bedside 05/26/23, 05/27/2023, daughter [name] [phone] 05/26/23, 05/27/2023, son bedside 05/28/2023, 05/29/2023, 06/02/23  Daughter bedside 06/01/2023.  Very thankful for care.   Disposition Plan: Status is: Inpatient Remains inpatient appropriate because: Severity of illness   Planned Discharge Destination:Home health   Diet: Diet Order             DIET DYS 2 Room service appropriate? No; Fluid consistency: Thin  Diet effective now                     Antimicrobial agents: Anti-infectives (From admission, onward)    Start     Dose/Rate Route Frequency Ordered Stop   05/26/23 1430  Ampicillin-Sulbactam (UNASYN) 3 g in sodium chloride 0.9 % 100 mL IVPB        3 g 200 mL/hr over 30 Minutes Intravenous Every 12 hours 05/26/23 1342 06/01/23 0824        MEDICATIONS: Scheduled Meds:  apixaban  5 mg Oral BID   carvedilol  6.25 mg Oral BID WC   Chlorhexidine Gluconate Cloth  6 each Topical Daily   finasteride  5 mg Oral Daily   mirtazapine  15 mg Oral QHS   [START ON 06/05/2023] rosuvastatin  10 mg Oral Daily   tamsulosin  0.4 mg Oral Daily   Continuous Infusions:    PRN Meds:.acetaminophen **OR** acetaminophen, hydrALAZINE, HYDROcodone-acetaminophen, labetalol, melatonin, morphine injection, naLOXone (NARCAN)  injection, ondansetron (ZOFRAN) IV, polyethylene glycol   I have personally reviewed following labs and imaging  studies  LABORATORY DATA:  Recent Labs  Lab 05/29/23 0624 05/30/23 0609 05/31/23 0721 06/01/23 1145 06/02/23 0319  WBC 22.9* 17.8* 14.1* 11.0* 10.4  HGB 13.9 14.7 13.3 11.9* 13.2  HCT 40.6 43.0 38.4* 34.8* 38.0*  PLT 215 262 293 323 363  MCV 79.1* 79.8* 77.9* 78.4* 80.0  MCH 27.1 27.3 27.0 26.8 27.8  MCHC 34.2 34.2 34.6 34.2 34.7  RDW 14.8 14.8 14.7 15.0 14.7  LYMPHSABS 0.9 0.7 0.9 0.9 0.5*  MONOABS 1.6* 1.2* 1.4* 1.2* 0.7  EOSABS 0.1 0.0 0.0 0.0 0.0  BASOSABS 0.0 0.0 0.0 0.0 0.0    Recent Labs  Lab  05/26/23 0931 05/27/23 0450 05/28/23 0329 05/29/23 0624 05/30/23 0609 05/30/23 1746 05/31/23 0721 06/01/23 1145 06/01/23 1146 06/01/23 1957 06/02/23 0319  NA 138   < > 136 136 137  --  138 139  --   --  140  K 4.4   < > 3.1* 3.6 3.1*  --  3.6 3.6  --   --  3.7  CL 104   < > 102 101 101  --  105 102  --   --  106  CO2 17*   < > 20* 17* 21*  --  22 23  --   --  21*  ANIONGAP 17*   < > 14 18* 15  --  11 14  --   --  13  GLUCOSE 94   < > 78 98 144*  --  109* 123*  --   --  96  BUN 24*   < > 19 16 14   --  15 21  --   --  23  CREATININE 1.88*   < > 1.83* 1.97* 1.93*  --  1.98* 2.19*  --   --  2.47*  AST 53*   < > 71* 75* 145*  --  317* 414*  --   --  521*  ALT 36   < > 56* 71* 145*  --  306* 434*  --   --  518*  ALKPHOS 54   < > 53 57 73  --  79 102  --   --  135*  BILITOT 1.3*   < > 0.9 1.3* 0.8  --  0.8 0.8  --   --  0.9  ALBUMIN 3.7   < > 2.8* 2.6* 2.6*  --  2.2* 1.9*  --   --  2.0*  CRP 4.4*   < > 17.2* 24.0* 25.0*  --   --  24.8*  --   --  24.9*  PROCALCITON 0.36   < > 0.55 0.64 0.52  --  0.41 0.63  --   --  0.64  INR  --   --   --   --   --  1.5*  --   --   --   --   --   HGBA1C 6.3*  --   --   --   --   --   --   --   --   --   --   BNP  --   --   --   --   --   --   --   --  37.4 40.8 30.5  MG  --    < > 1.9 1.9 2.0  --   --  2.2  --   --  2.1  CALCIUM 9.8   < > 8.8* 8.8* 9.1  --  8.7*  8.6*  --   --  8.7*   < > = values in this interval not displayed.    Lab Results  Component Value Date   CHOL 116 05/23/2023   HDL 65 05/23/2023   LDLCALC 39 05/23/2023   LDLDIRECT 163.3 05/26/2008   TRIG 59 05/23/2023   CHOLHDL 1.8 05/23/2023   Lab Results  Component Value Date   HGBA1C 6.3 (H) 05/26/2023      Recent Labs  Lab 05/26/23 0931 05/27/23 0450 05/28/23 0329 05/29/23 0624 05/30/23 0609 05/30/23 1746 05/31/23 0721 06/01/23 1145 06/01/23 1146 06/01/23 1957 06/02/23 0319  CRP 4.4*   < > 17.2* 24.0* 25.0*  --   --  24.8*  --   --  24.9*  PROCALCITON 0.36   < >  0.55 0.64 0.52  --  0.41 0.63  --   --  0.64  INR  --   --   --   --   --  1.5*  --   --   --   --   --   HGBA1C 6.3*  --   --   --   --   --   --   --   --   --   --   BNP  --   --   --   --   --   --   --   --  37.4 40.8 30.5  MG  --    < > 1.9 1.9 2.0  --   --  2.2  --   --  2.1  CALCIUM 9.8   < > 8.8* 8.8* 9.1  --  8.7* 8.6*  --   --  8.7*   < > = values in this interval not displayed.    RADIOLOGY STUDIES/RESULTS: DG Chest Port 1 View  Result Date: 06/01/2023 CLINICAL DATA:  Shortness of breath EXAM: PORTABLE CHEST 1 VIEW COMPARISON:  05/28/2023 FINDINGS: Heart size and mediastinal contours are normal. Increased opacity at the left base which partially obscures the left hemidiaphragm. Right lung appears clear. Visualized osseous structures are unremarkable. IMPRESSION: Persistent and slightly progressive opacity within the left base which now obscures the left hemidiaphragm. Imaging findings compatible with atelectasis and/or consolidative change. Electronically Signed   By: Signa Kell M.D.   On: 06/01/2023 07:14     LOS: 11 days   Signature  -    Susa Raring M.D on 06/02/2023 at 7:54 AM   -  To page go to [email or web address]

## 2023-06-02 NOTE — Progress Notes (Signed)
Daily Progress Note   Patient Name: Eric [name]       Date: 06/02/2023 DOB: 05-06-1941  Age: 82 y.o. MRN#: [id number] Attending Physician: Leroy Sea, MD Primary Care Physician: Tresa Garter, MD Admit Date: 05/22/2023  Reason for Consultation/Follow-up: Establishing goals of care  Subjective: I have reviewed medical records including EPIC notes, MAR, and labs. Received report from primary RN - no acute concerns. RN reports patient's son attempted to feed patient breakfast; however, he did not tolerate and had significant coughing. RN was not able to give morning medications due to coughing/likely aspiration as well.  Went to visit patient at bedside - son/Eric [name] present. Patient was lying in bed - he did not wake to voice/gentle touch. No signs or non-verbal gestures of pain or discomfort noted. No respiratory distress, increased work of breathing, or secretions noted. He is very ill and frail appearing. C collar in use.  Emotional support provided to [name]. Therapeutic listening provided as he reflects on the family meeting held this weekend. Continued to express concern patient is not improving and not eating enough to sustain himself long term. I questioned if the family had continued discussions regarding hospice care - he tells me they have not, but need to. He states, "I think we know what we have to do." Offered to help facilitate another family meeting vs if family prefer to continue discussions among themselves, but encouraged ongoing discussions as patient is likely approaching end of life. Eric [name] nods his head and states he will discuss with family. He expresses appreciation for PMT support. Family has already been provided the Hard Choices book.   All questions and  concerns addressed. Encouraged to call with questions and/or concerns. PMT card previously provided.  Length of Stay: 11  Current Medications: Scheduled Meds:   apixaban  5 mg Oral BID   carvedilol  6.25 mg Oral BID WC   Chlorhexidine Gluconate Cloth  6 each Topical Daily   finasteride  5 mg Oral Daily   mirtazapine  15 mg Oral QHS   [START ON 06/05/2023] rosuvastatin  10 mg Oral Daily   tamsulosin  0.4 mg Oral Daily    Continuous Infusions:   PRN Meds: acetaminophen **OR** acetaminophen, hydrALAZINE, HYDROcodone-acetaminophen, labetalol, melatonin, morphine injection, naLOXone (NARCAN)  injection, ondansetron (ZOFRAN) IV, polyethylene glycol  Physical  Exam Vitals and nursing note reviewed.  Constitutional:      General: He is not in acute distress.    Appearance: He is cachectic. He is ill-appearing.  Pulmonary:     Effort: No respiratory distress.  Skin:    General: Skin is warm and dry.  Neurological:     Motor: Weakness present.     Comments: asleep             Vital Signs: BP (!) 112/57 (BP Location: Right Arm)   Pulse 83   Temp 98.6 F (37 C) (Oral)   Resp 20   Ht 5\' 8"  (1.727 m)   Wt 59 kg   SpO2 99%   BMI 19.78 kg/m  SpO2: SpO2: 99 % O2 Device: O2 Device: Nasal Cannula O2 Flow Rate: O2 Flow Rate (L/min): 2 L/min  Intake/output summary: No intake or output data in the 24 hours ending 06/02/23 0931 LBM: Last BM Date : 05/26/23 Baseline Weight: Weight: 61 kg Most recent weight: Weight: 59 kg       Palliative Assessment/Data: PPS 10-20%      Patient Active Problem List   Diagnosis Date Noted   Acute encephalopathy 05/23/2023   C6 cervical fracture (HCC) 05/23/2023   Elevated troponin 05/23/2023   BPH (benign prostatic hyperplasia) 05/23/2023   Acute ischemic stroke (HCC) 05/22/2023   Confusion 03/13/2023   Abnormal CXR 02/23/2023   Subarachnoid bleed (HCC) 02/22/2023   CVA (cerebral vascular accident) (HCC) 02/22/2023   Anemia 01/02/2023    Restless legs syndrome (RLS) 04/24/2021   Abnormal ultrasound of bladder 12/25/2020   UTI (urinary tract infection) 09/25/2020   Cervical pain (neck) 05/15/2018   Abdominal bloating 05/15/2018   Arm pain, diffuse, left 12/31/2017   Well adult exam 07/30/2017   Vitamin D deficiency 12/18/2016   Osteoarthritis 12/18/2016   B12 deficiency 09/16/2016   Hypokalemia 08/16/2016   Dizziness and giddiness 11/15/2015   Blurred vision 11/15/2015   Headache 07/07/2013   Hyperlipemia 07/07/2013   Acute intracranial hemorrhage (HCC) 07/03/2013   Pain of both eyes 07/02/2013   Acute bronchitis 07/02/2013   ERECTILE DYSFUNCTION, 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 male with past medical history of stroke, essential pretension, who was admitted to [facility] on 05/22/2023 with suspected acute versus subacute ischemic stroke after presenting from home to [facility] ED for evaluation of fall.   Assessment: Principal Problem:   Acute ischemic stroke [facility]) Active Problems:   Essential hypertension   Acute intracranial hemorrhage (HCC)   Acute encephalopathy   C6 cervical fracture (HCC)   Elevated troponin   BPH (benign prostatic hyperplasia)   Concern about end of life  Recommendations/Plan: Continue full code/full scope Family are hopeful for improvement; however, encouraged ongoing hospice discussions as he is not eating enough to sustain himself long term No PEG per previous discussions PMT will continue to follow and support holistically  Goals of Care and Additional Recommendations: Limitations on Scope of Treatment: Full Scope Treatment  Code Status:    Code Status Orders  (From admission, onward)           Start     Ordered   05/22/23 2315  Full code  Continuous       Question:  By:  Answer:  Consent: discussion  documented in EHR   05/22/23 2314  Code Status History     Date Active Date Inactive Code Status Order ID Comments User Context   02/22/2023 2231 02/24/2023 1819 Full Code [id number]  Therisa Doyne, MD ED       Prognosis:  Poor  Discharge Planning: To Be Determined  Care plan was discussed with primary RN, patient's son  Thank you for allowing the Palliative Medicine Team to assist in the care of this patient.   Total Time 50 minutes Prolonged Time Billed  no       Greater than 50%  of this time was spent counseling and coordinating care related to the above assessment and plan.  Haskel Khan, NP  Please contact Palliative Medicine Team phone at [phone] for questions and concerns.   *Portions of this note are a verbal dictation therefore any spelling and/or grammatical errors are due to the "[employer]agon [employer] One" system interpretation.

## 2023-06-02 NOTE — Progress Notes (Addendum)
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  Continue anticoagulation  No vascular intervention is indicated Pending timing for repeat CTA.   CR trending up no 2.47  Palliative goals of care Goals care clear for full code/full scope treatment Patient's family are very hopeful for improvement, wish to encourage oral intake and mobility   Mosetta Pigeon 06/02/2023 7:13 AM --  Laboratory Lab Results: Recent Labs    06/01/23 1145 06/02/23 0319  WBC 11.0* 10.4  HGB 11.9* 13.2  HCT 34.8* 38.0*  PLT 323 363   BMET Recent Labs    06/01/23 1145 06/02/23 0319  NA 139 140  K 3.6 3.7  CL 102 106  CO2 23 21*  GLUCOSE 123* 96  BUN 21 23  CREATININE 2.19* 2.47*  CALCIUM 8.6* 8.7*    COAG Lab Results  Component Value Date   INR 1.5 (H) 05/30/2023   INR 1.2 05/23/2023   INR 1.1 02/22/2023   No results found for: "PTT"  I have seen and evaluated the patient. I agree with the PA note as documented above.  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  Unable to get repeat CT due to creatinine remains elevated.  Cephus Shelling, MD [facility] [place] Office: [phone]

## 2023-06-02 NOTE — Plan of Care (Signed)
  Problem: Education: Goal: Knowledge of disease or condition will improve Outcome: Progressing   Problem: Coping: Goal: Will verbalize positive feelings about self Outcome: Progressing   Problem: Health Behavior/Discharge Planning: Goal: Goals will be collaboratively established with patient/family Outcome: Progressing   Problem: Self-Care: Goal: Verbalization of feelings and concerns over difficulty with self-care will improve Outcome: Progressing Goal: Ability to communicate needs accurately will improve Outcome: Progressing   Problem: Nutrition: Goal: Risk of aspiration will decrease Outcome: Progressing Goal: Dietary intake will improve Outcome: Progressing   Problem: Clinical Measurements: Goal: Will remain free from infection Outcome: Progressing Goal: Respiratory complications will improve Outcome: Progressing   Problem: Activity: Goal: Risk for activity intolerance will decrease Outcome: Progressing   Problem: Elimination: Goal: Will not experience complications related to bowel motility Outcome: Progressing   Problem: Pain Managment: Goal: General experience of comfort will improve Outcome: Progressing

## 2023-06-03 ENCOUNTER — Inpatient Hospital Stay (HOSPITAL_COMMUNITY): Payer: Medicare Other

## 2023-06-03 DIAGNOSIS — S0636AA Traumatic hemorrhage of cerebrum, unspecified, with loss of consciousness status unknown, initial encounter: Secondary | ICD-10-CM | POA: Diagnosis not present

## 2023-06-03 DIAGNOSIS — I639 Cerebral infarction, unspecified: Secondary | ICD-10-CM | POA: Diagnosis not present

## 2023-06-03 DIAGNOSIS — W19XXXA Unspecified fall, initial encounter: Secondary | ICD-10-CM | POA: Diagnosis not present

## 2023-06-03 DIAGNOSIS — K719 Toxic liver disease, unspecified: Secondary | ICD-10-CM | POA: Diagnosis not present

## 2023-06-03 DIAGNOSIS — S12501A Unspecified nondisplaced fracture of sixth cervical vertebra, initial encounter for closed fracture: Secondary | ICD-10-CM | POA: Diagnosis not present

## 2023-06-03 LAB — COMPREHENSIVE METABOLIC PANEL
ALT: 513 U/L — ABNORMAL HIGH (ref 0–44)
AST: 349 U/L — ABNORMAL HIGH (ref 15–41)
Albumin: 2 g/dL — ABNORMAL LOW (ref 3.5–5.0)
Alkaline Phosphatase: 148 U/L — ABNORMAL HIGH (ref 38–126)
Anion gap: 15 (ref 5–15)
BUN: 36 mg/dL — ABNORMAL HIGH (ref 8–23)
CO2: 20 mmol/L — ABNORMAL LOW (ref 22–32)
Calcium: 8.8 mg/dL — ABNORMAL LOW (ref 8.9–10.3)
Chloride: 107 mmol/L (ref 98–111)
Creatinine, Ser: 3.26 mg/dL — ABNORMAL HIGH (ref 0.61–1.24)
GFR, Estimated: 18 mL/min — ABNORMAL LOW (ref >60)
Glucose, Bld: 146 mg/dL — ABNORMAL HIGH (ref 70–99)
Potassium: 4 mmol/L (ref 3.5–5.1)
Sodium: 142 mmol/L (ref 135–145)
Total Bilirubin: 1.1 mg/dL (ref 0.3–1.2)
Total Protein: 6.1 g/dL — ABNORMAL LOW (ref 6.5–8.1)

## 2023-06-03 LAB — EPSTEIN BARR VRS(EBV DNA BY PCR): EBV DNA QN by PCR: NEGATIVE [IU]/mL

## 2023-06-03 LAB — CBC WITH DIFFERENTIAL/PLATELET
Abs Immature Granulocytes: 0.07 10*3/uL (ref 0.00–0.07)
Basophils Absolute: 0 10*3/uL (ref 0.0–0.1)
Basophils Relative: 0 %
Eosinophils Absolute: 0 10*3/uL (ref 0.0–0.5)
Eosinophils Relative: 0 %
HCT: 36.7 % — ABNORMAL LOW (ref 39.0–52.0)
Hemoglobin: 12.6 g/dL — ABNORMAL LOW (ref 13.0–17.0)
Immature Granulocytes: 1 %
Lymphocytes Relative: 4 %
Lymphs Abs: 0.6 10*3/uL — ABNORMAL LOW (ref 0.7–4.0)
MCH: 26.5 pg (ref 26.0–34.0)
MCHC: 34.3 g/dL (ref 30.0–36.0)
MCV: 77.3 fL — ABNORMAL LOW (ref 80.0–100.0)
Monocytes Absolute: 1 10*3/uL (ref 0.1–1.0)
Monocytes Relative: 8 %
Neutro Abs: 11.5 10*3/uL — ABNORMAL HIGH (ref 1.7–7.7)
Neutrophils Relative %: 87 %
Platelets: 401 10*3/uL — ABNORMAL HIGH (ref 150–400)
RBC: 4.75 MIL/uL (ref 4.22–5.81)
RDW: 14.7 % (ref 11.5–15.5)
WBC: 13.1 10*3/uL — ABNORMAL HIGH (ref 4.0–10.5)
nRBC: 0 % (ref 0.0–0.2)

## 2023-06-03 LAB — C-REACTIVE PROTEIN: CRP: 31.8 mg/dL — ABNORMAL HIGH (ref <1.0)

## 2023-06-03 LAB — PROCALCITONIN: Procalcitonin: 3.19 ng/mL

## 2023-06-03 LAB — IGG: IgG (Immunoglobin G), Serum: 893 mg/dL (ref 603–1613)

## 2023-06-03 LAB — CMV DNA, QUANTITATIVE, PCR
CMV DNA Quant: NEGATIVE [IU]/mL
Log10 CMV Qn DNA Pl: UNDETERMINED {Log}

## 2023-06-03 LAB — MAGNESIUM: Magnesium: 2.2 mg/dL (ref 1.7–2.4)

## 2023-06-03 LAB — MITOCHONDRIAL ANTIBODIES: Mitochondrial M2 Ab, IgG: <20 U (ref 0.0–20.0)

## 2023-06-03 LAB — PROTIME-INR
INR: 2 — ABNORMAL HIGH (ref 0.8–1.2)
Prothrombin Time: 23.1 s — ABNORMAL HIGH (ref 11.4–15.2)

## 2023-06-03 LAB — ANTI-SMOOTH MUSCLE ANTIBODY, IGG: F-Actin IgG: 11 U (ref 0–19)

## 2023-06-03 LAB — BRAIN NATRIURETIC PEPTIDE: B Natriuretic Peptide: 50.2 pg/mL (ref 0.0–100.0)

## 2023-06-03 MED ORDER — SODIUM CHLORIDE 0.9 % IV SOLN
500.0000 mg | Freq: Two times a day (BID) | INTRAVENOUS | Status: DC
  Administered 2023-06-03 – 2023-06-05 (×5): 500 mg via INTRAVENOUS
  Filled 2023-06-03 (×7): qty 10

## 2023-06-03 MED ORDER — KCL IN DEXTROSE-NACL 10-5-0.45 MEQ/L-%-% IV SOLN
INTRAVENOUS | Status: DC
  Filled 2023-06-03 (×2): qty 1000

## 2023-06-03 MED ORDER — MORPHINE SULFATE (PF) 2 MG/ML IV SOLN
2.0000 mg | Freq: Four times a day (QID) | INTRAVENOUS | Status: DC | PRN
  Administered 2023-06-03 – 2023-06-04 (×4): 2 mg via INTRAVENOUS
  Filled 2023-06-03 (×4): qty 1

## 2023-06-03 NOTE — Progress Notes (Signed)
Daily Progress Note   Patient Name: Eric [name]       Date: 06/03/2023 DOB: 01/02/1941  Age: 82 y.o. MRN#: [id number] Attending Physician: Leroy Sea, MD Primary Care Physician: Tresa Garter, MD Admit Date: 05/22/2023  Reason for Consultation/Follow-up: Establishing goals of care  Subjective: I have reviewed medical records including EPIC notes, MAR, and labs. Received report from primary RN - no acute concerns.  Went to visit with patient at bedside - daughter/Angel present. Patient was lying in bed - he does not respond to voice/gentle touch. Gestures of pain/discomfort noted. No respiratory distress, increased work of breathing, or secretions noted. C collar in use. He is on 2L O2 [place].  Emotional support provided to [facility] Therapeutic listening provided as she reflects on the discussion with Dr. Thedore Mins this morning. Reiterated that patient has been hospitalized for 12 days without significant improvement, even worsening of his condition despite medial intervention. Prepared [name] that patient is likely approaching the end of is life - she understandably becomes tearful.   Therapeutic listening provided as she reflects on her brother passing away in 2019.   Reviewed that patient is no longer tolerating his oral medications and has received multiple doses of medication for pain. Encouraged family to consider transition to comfort measures in context of his ongoing decline and poor oral intake. We talked about transition to comfort measures in house and what that would entail inclusive of medications to control pain, dyspnea, agitation, nausea, and itching. We discussed stopping all unnecessary measures such as blood draws, needle sticks, oxygen, antibiotics, CBGs/insulin, cardiac  monitoring, IVF, and frequent vital signs. Education provided that other non-pharmacological interventions would be utilized for holistic support and comfort such as spiritual support if requested, repositioning, music therapy, offering comfort feeds, and/or therapeutic listening. All care would focus on how the patient is looking and feeling.   Offered another palliative meeting vs encouraged family to discuss among themselves. At this time, plan is to proceed with MBS today.  Requested RN administer dose of morphine when next available.   Angel expressed appreciation for PMT visit today.  All questions and concerns addressed. Encouraged to call with questions and/or concerns. PMT card provided.  Length of Stay: 12  Current Medications: Scheduled Meds:   apixaban  5 mg Oral BID   carvedilol  6.25 mg Oral BID WC   Chlorhexidine Gluconate Cloth  6 each Topical Daily   finasteride  5 mg Oral Daily   mirtazapine  15 mg Oral QHS   [START ON 06/05/2023] rosuvastatin  10 mg Oral Daily   tamsulosin  0.4 mg Oral Daily    Continuous Infusions:  meropenem (MERREM) IV      PRN Meds: acetaminophen **OR** acetaminophen, hydrALAZINE, labetalol, melatonin, morphine injection, naLOXone (NARCAN)  injection, ondansetron (ZOFRAN) IV, polyethylene glycol  Physical Exam Vitals and nursing note reviewed.  Constitutional:      General: He is not in acute distress.    Appearance: He is cachectic. He is ill-appearing.  Pulmonary:     Effort: No respiratory distress.  Skin:    General: Skin is warm and dry.  Neurological:     Mental Status: He is lethargic.     Motor: Weakness present.             Vital Signs: BP 122/64   Pulse 94   Temp 98.1 F (36.7 C) (Axillary)   Resp (!) 23   Ht 5\' 8"  (1.727 m)   Wt 60.3 kg   SpO2 93%   BMI 20.21 kg/m  SpO2: SpO2: 93 % O2 Device: O2 Device: Nasal Cannula O2 Flow Rate: O2 Flow Rate (L/min): 3 L/min  Intake/output summary:  Intake/Output Summary  (Last 24 hours) at 06/03/2023 1610 Last data filed at 06/03/2023 0600 Gross per 24 hour  Intake 0 ml  Output 700 ml  Net -700 ml   LBM: Last BM Date : 05/26/23 Baseline Weight: Weight: 61 kg Most recent weight: Weight: 60.3 kg       Palliative Assessment/Data: PPS 10-20%      Patient Active Problem List   Diagnosis Date Noted   Elevated liver enzymes 06/02/2023   Drug-induced liver injury 06/02/2023   Acute encephalopathy 05/23/2023   C6 cervical fracture (HCC) 05/23/2023   Elevated troponin 05/23/2023   BPH (benign prostatic hyperplasia) 05/23/2023   Acute ischemic stroke (HCC) 05/22/2023   Confusion 03/13/2023   Abnormal CXR 02/23/2023   Subarachnoid bleed (HCC) 02/22/2023   CVA (cerebral vascular accident) (HCC) 02/22/2023   Anemia 01/02/2023   Restless legs syndrome (RLS) 04/24/2021   Abnormal ultrasound of bladder 12/25/2020   UTI (urinary tract infection) 09/25/2020   Cervical pain (neck) 05/15/2018   Abdominal bloating 05/15/2018   Arm pain, diffuse, left 12/31/2017   Well adult exam 07/30/2017   Vitamin D deficiency 12/18/2016   Osteoarthritis 12/18/2016   B12 deficiency 09/16/2016   Hypokalemia 08/16/2016   Dizziness and giddiness 11/15/2015   Blurred vision 11/15/2015   Headache 07/07/2013   Hyperlipemia 07/07/2013   Acute intracranial hemorrhage (HCC) 07/03/2013   Pain of both eyes 07/02/2013   Acute bronchitis 07/02/2013   ERECTILE DYSFUNCTION, 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 male with past medical history of stroke, essential pretension, who was admitted to [facility] on 05/22/2023 with suspected acute versus subacute ischemic stroke after presenting from home to [facility] ED for evaluation of fall.   Assessment: Principal Problem:   Acute ischemic stroke [facility]) Active Problems:    Essential hypertension   Acute intracranial hemorrhage (HCC)   Acute encephalopathy   C6 cervical fracture (HCC)   Elevated troponin   BPH (benign prostatic hyperplasia)   Elevated liver enzymes  Drug-induced liver injury   Concern about end of life  Recommendations/Plan: Continue full code/full scope Family have been hopeful for improvement; however, encouraging ongoing family discussions regarding transition to comfort/hospice care as patient continues will poor oral intake and progressive decline No PEG per previous discussions PMT will continue to follow and support holistically  Goals of Care and Additional Recommendations: Limitations on Scope of Treatment: Full Scope Treatment  Code Status:    Code Status Orders  (From admission, onward)           Start     Ordered   05/22/23 2315  Full code  Continuous       Question:  By:  Answer:  Consent: discussion documented in EHR   05/22/23 2314           Code Status History     Date Active Date Inactive Code Status Order ID Comments User Context   02/22/2023 2231 02/24/2023 1819 Full Code [id number]  Therisa Doyne, MD ED       Prognosis:  Poor  Discharge Planning: To Be Determined  Care plan was discussed with primary RN, patient's daughter  Thank you for allowing the Palliative Medicine Team to assist in the care of this patient.   Haskel Khan, NP  Please contact Palliative Medicine Team phone at [phone] for questions and concerns.   *Portions of this note are a verbal dictation therefore any spelling and/or grammatical errors are due to the "[employer]agon [employer] One" system interpretation.

## 2023-06-03 NOTE — Progress Notes (Addendum)
PROGRESS NOTE        PATIENT DETAILS Name: Eric [name] Age: 82 y.o. Sex: male Date of Birth: 19-Mar-1941 Admit Date: 05/22/2023 Admitting Physician Eric Fava, DO ZOX:[id number], Eric Quint, MD  Brief Summary: Patient is a 82 y.o.  male with history of CVA, HTN-who sustained a mechanical fall at home-upon further evaluation-was found to have acute CVA and C6 vertebral body fracture.  Patient was initially evaluated at [facility] and transferred to [facility] service at [facility] for further evaluation  Significant events: 9/19>> admit to [facility]  Significant studies: 6/23>> A1c: 5.8 9/19>> CT head: 0.9 cm focus of intraparenchymal hemorrhage right frontal lobe.  Acute to subacute cortical infarct left parietal lobe. 9/19 >>CT C-spine: Acute nondisplaced fracture through the anterior inferior corner of C6 vertebral body. 9/20>> CT head: Stable 1 cm anterior right middle frontal gyrus hemorrhagic contusion versus trace subarachnoid blood. 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 9/20>> MRI brain: Acute infarct anterior right frontal lobe. 9/20>> echo: EF 55-60% 9/20>> LDL: 112 05/25/2023.  Foley catheter placed by urology 05/26/23 >> CT - 1. 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 Assessment by vascular surgery or interventional radiology is recommended. 2. Extensive wedge-shaped areas of poor enhancement of the right kidney, compatible with right renal infarction. 3. Patchy airspace consolidation within the dependent portion of the left lower lobe, concerning for pneumonia. Small focus of ground-glass opacity within the left upper lobe, which may represent an additional focus of infection. 4.  Thick-walled urinary bladder which is partially decompressed by Foley catheter. Correlate with urinalysis. 5. Patchy increased sclerosis within the T12 vertebral body, nonspecific. Consider further evaluation with nonemergent MRI of the thoracic spine. Aortic Atherosclerosis (ICD10-I70.0) and Emphysema (ICD10-J43.9).  05/27/23 - Ct head - stable, reviewed with neurologist Dr. Pearlean [name].   Significant microbiology data: None  Procedures: None  Consults: Neurology, urology, Pall care, GI  Subjective: Patient in bed, appears comfortable, denies any headache, no fever, no chest pain or pressure, no shortness of breath , no abdominal pain. No new focal weakness.   Objective: Vitals: Blood pressure 122/64, pulse 94, temperature 98.1 F (36.7 C), temperature source Axillary, resp. rate (!) 23, height 5\' 8"  (1.727 m), weight 60.3 kg, SpO2 93%.   Exam:  Frail elderly AA male, Awake Alert, No new F.N deficits, soft c-collar in place, Foley catheter in place [place].AT,PERRAL Supple Neck, No JVD,   Symmetrical Chest wall movement, Good air movement bilaterally, CTAB RRR,No Gallops, Rubs or new Murmurs,  +ve B.Sounds, Abd Soft,   No Cyanosis, Clubbing or edema    Assessment/Plan:  Acute right frontal lobe CVA some questionable hemorrhagic contusion on CT not confirmed on MRI.  Neurology on board, now on Eliquis and statin, aspirin stopped, stable LDL, echo and recent A1c, had mild left-sided weakness and dysarthria.  Full stroke workup done, defer further recommendations to the neurology team.   C6 vertebral body fracture  CT and MRI although limited reviewed, seen by neurosurgery, hard c-collar removed by neurosurgery on 05/24/2022, PT OT, has subjectives comfort hence placed a soft c-collar on 05/27/2023.  Pain regimen adjusted for better pain control.  HTN  more than 4 days out of CVA gradually reintroduce blood pressure medications  Hypokalemia.  Replace.    Asymptomatic transaminitis.  Could  be due to Crestor, hold further statin for 4 days, INR stable when accounted for Eliquis/heparin, acute hepatitis panel and right upper quadrant ultrasound stable, no events of hypotension, total bilirubin stable, GI on board, they are of the opinion that LFTs elevated due to ampicillin induced injury, avoid penicillin for now, LFTs have plateaued continue to monitor.  Ongoing microaspiration.  Please see below.  Speech therapy following, MBS scheduled for 06/03/2023, this has been going on for months in my opinion, he is extremely cachectic and weak, discussed with the daughter Eric [name] bedside on 06/03/2023, she understands that patient is declining and appears to be terminal, she says she is will talk to her family about comfort measures.  For now oral diet per speech therapy with aspiration precautions, resume antibiotics Rocephin and Flagyl on 06/03/2023.  Avoiding penicillin as GI thinks that LFTs elevated due to ampicillin induced injury.   Addendum - failed MBS, family again informed of ++ risk of aspiraion, PNA and death, they were to witness MBS today but at the last minute chose not to attend, family despite warning want PO diet - warned of possible aspiration and death.    HLD  Resume Crestor-increase to 40 mg daily  BPH - Proscar but developed urinary retention, urology was called Foley catheter was placed by Dr. Alvester [name] on 05/25/2023.   Nonspecific abdominal pain and sweats morning of 05/26/2023.  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 Pearlean [name].  He was placed on heparin drip eventually on Eliquis, clinically better.  Head CT repeated to monitor the hemorrhagic conversion's.  Discussed with patient's son and daughter in detail.  May benefit from TEE once he is more stronger in the future in the outpatient setting, too sick for now.  Patchy increased sclerosis within the T12 vertebral  body, nonspecific. Outpatient further evaluation with nonemergent MRI of the thoracic spine.  Possible aspiration pneumonia.  Unasyn and monitor.  Speech following.  Hypokalemia.  Replace.  Overall cachexia, failure to thrive, poor oral intake, copious oral secretions with limited ability to control them with ongoing microaspiration, ongoing for several months, Remeron added for appetite stimulation, required suctioning of oral accretions, palliative care for goals of care for long-term, now family wants to pursue full code and full scope of treatment.  Unfortunately family not coming to grips with patient's gradual and continued decline over several months.    Underweight: Estimated body mass index is 20.21 kg/m as calculated from the following:   Height as of this encounter: 5\' 8"  (1.727 m).   Weight as of this encounter: 60.3 kg.   Code status:   Code Status: Full Code   DVT Prophylaxis: SCDs Start: 05/22/23 2315 apixaban (ELIQUIS) tablet 5 mg   Family Communication: Son and wife at bedside 05/25/2023, son bedside 05/26/23, 05/27/2023, daughter [name] [phone] 05/26/23, 05/27/2023, son bedside 05/28/2023, 05/29/2023, 06/02/23  Daughter bedside 06/01/2023.  Very thankful for care.  Discussed with daughter Eric [name] bedside on 06/03/2023.  She understands that her dad has been gradually declining for a while she says in fact for the last 2 years after he received COVID-vaccine that initially he had a stroke and thereafter there is been a gradual and sustained decline, she now understands that he is not doing well and has been aspirating oral secretions and  food, she will talk to the family about comfort measures if there is further decline.   Disposition Plan: Status is: Inpatient Remains inpatient appropriate because: Severity of illness   Planned Discharge Destination:Home health   Diet: Diet Order             DIET DYS 2 Room service appropriate? No; Fluid consistency: Thin  Diet  effective now                   MEDICATIONS: Scheduled Meds:  apixaban  5 mg Oral BID   carvedilol  6.25 mg Oral BID WC   Chlorhexidine Gluconate Cloth  6 each Topical Daily   finasteride  5 mg Oral Daily   mirtazapine  15 mg Oral QHS   [START ON 06/05/2023] rosuvastatin  10 mg Oral Daily   tamsulosin  0.4 mg Oral Daily   Continuous Infusions:    PRN Meds:.acetaminophen **OR** acetaminophen, hydrALAZINE, labetalol, melatonin, morphine injection, naLOXone (NARCAN)  injection, ondansetron (ZOFRAN) IV, polyethylene glycol   I have personally reviewed following labs and imaging studies  LABORATORY DATA:  Recent Labs  Lab 05/30/23 0609 05/31/23 0721 06/01/23 1145 06/02/23 0319 06/03/23 0315  WBC 17.8* 14.1* 11.0* 10.4 13.1*  HGB 14.7 13.3 11.9* 13.2 12.6*  HCT 43.0 38.4* 34.8* 38.0* 36.7*  PLT 262 293 323 363 401*  MCV 79.8* 77.9* 78.4* 80.0 77.3*  MCH 27.3 27.0 26.8 27.8 26.5  MCHC 34.2 34.6 34.2 34.7 34.3  RDW 14.8 14.7 15.0 14.7 14.7  LYMPHSABS 0.7 0.9 0.9 0.5* 0.6*  MONOABS 1.2* 1.4* 1.2* 0.7 1.0  EOSABS 0.0 0.0 0.0 0.0 0.0  BASOSABS 0.0 0.0 0.0 0.0 0.0    Recent Labs  Lab 05/29/23 0624 05/30/23 0609 05/30/23 1746 05/31/23 0721 06/01/23 1145 06/01/23 1146 06/01/23 1957 06/02/23 0319 06/02/23 1352 06/03/23 0315  NA 136 137  --  138 139  --   --  140  --  142  K 3.6 3.1*  --  3.6 3.6  --   --  3.7  --  4.0  CL 101 101  --  105 102  --   --  106  --  107  CO2 17* 21*  --  22 23  --   --  21*  --  20*  ANIONGAP 18* 15  --  11 14  --   --  13  --  15  GLUCOSE 98 144*  --  109* 123*  --   --  96  --  146*  BUN 16 14  --  15 21  --   --  23  --  36*  CREATININE 1.97* 1.93*  --  1.98* 2.19*  --   --  2.47*  --  3.26*  AST 75* 145*  --  317* 414*  --   --  521*  --  349*  ALT 71* 145*  --  306* 434*  --   --  518*  --  513*  ALKPHOS 57 73  --  79 102  --   --  135*  --  148*  BILITOT 1.3* 0.8  --  0.8 0.8  --   --  0.9  --  1.1  ALBUMIN 2.6* 2.6*  --   2.2* 1.9*  --   --  2.0*  --  2.0*  CRP 24.0* 25.0*  --   --  24.8*  --   --  24.9*  --  31.8*  PROCALCITON 0.64  0.52  --  0.41 0.63  --   --  0.64  --  3.19  INR  --   --  1.5*  --   --   --   --   --  1.9* 2.0*  AMMONIA  --   --   --   --   --   --   --   --  17  --   BNP  --   --   --   --   --  37.4 40.8 30.5  --  50.2  MG 1.9 2.0  --   --  2.2  --   --  2.1  --  2.2  CALCIUM 8.8* 9.1  --  8.7* 8.6*  --   --  8.7*  --  8.8*    Lab Results  Component Value Date   CHOL 116 05/23/2023   HDL 65 05/23/2023   LDLCALC 39 05/23/2023   LDLDIRECT 163.3 05/26/2008   TRIG 59 05/23/2023   CHOLHDL 1.8 05/23/2023   Lab Results  Component Value Date   HGBA1C 6.3 (H) 05/26/2023      Recent Labs  Lab 05/29/23 0624 05/30/23 0609 05/30/23 1746 05/31/23 0721 06/01/23 1145 06/01/23 1146 06/01/23 1957 06/02/23 0319 06/02/23 1352 06/03/23 0315  CRP 24.0* 25.0*  --   --  24.8*  --   --  24.9*  --  31.8*  PROCALCITON 0.64 0.52  --  0.41 0.63  --   --  0.64  --  3.19  INR  --   --  1.5*  --   --   --   --   --  1.9* 2.0*  AMMONIA  --   --   --   --   --   --   --   --  17  --   BNP  --   --   --   --   --  37.4 40.8 30.5  --  50.2  MG 1.9 2.0  --   --  2.2  --   --  2.1  --  2.2  CALCIUM 8.8* 9.1  --  8.7* 8.6*  --   --  8.7*  --  8.8*    RADIOLOGY STUDIES/RESULTS: DG Chest Port 1 View  Result Date: 06/03/2023 CLINICAL DATA:  Shortness of breath. EXAM: PORTABLE CHEST 1 VIEW COMPARISON:  06/01/2023 FINDINGS: There is interval progression of medial right base collapse/consolidation with persistent airspace disease at the left base. The cardiopericardial silhouette is within normal limits for size. No pulmonary edema or substantial pleural effusion. Gaseous distention of the colon again noted upper abdomen. IMPRESSION: Interval progression of medial right base collapse/consolidation with persistent airspace disease at the left base. Electronically Signed   By: Kennith Center M.D.   On:  06/03/2023 06:43     LOS: 12 days   Signature  -    Susa Raring M.D on 06/03/2023 at 7:47 AM   -  To page go to [email or web address]

## 2023-06-03 NOTE — Plan of Care (Signed)
Problem: Education: Goal: Knowledge of disease or condition will improve 06/03/2023 0518 by Philipp Ovens, RN Outcome: Progressing 06/03/2023 0516 by Philipp Ovens, RN Outcome: Progressing Goal: Knowledge of secondary prevention will improve (MUST DOCUMENT ALL) 06/03/2023 0518 by Philipp Ovens, RN Outcome: Progressing 06/03/2023 0516 by Philipp Ovens, RN Outcome: Progressing Goal: Knowledge of patient specific risk factors will improve Loraine Leriche N/A or DELETE if not current risk factor) 06/03/2023 0518 by Philipp Ovens, RN Outcome: Progressing 06/03/2023 0516 by Philipp Ovens, RN Outcome: Progressing   Problem: Ischemic Stroke/TIA Tissue Perfusion: Goal: Complications of ischemic stroke/TIA will be minimized 06/03/2023 0518 by Philipp Ovens, RN Outcome: Progressing 06/03/2023 0516 by Philipp Ovens, RN Outcome: Progressing   Problem: Coping: Goal: Will verbalize positive feelings about self 06/03/2023 0518 by Philipp Ovens, RN Outcome: Progressing 06/03/2023 0516 by Philipp Ovens, RN Outcome: Progressing Goal: Will identify appropriate support needs 06/03/2023 0518 by Philipp Ovens, RN Outcome: Progressing 06/03/2023 0516 by Philipp Ovens, RN Outcome: Progressing   Problem: Health Behavior/Discharge Planning: Goal: Ability to manage health-related needs will improve 06/03/2023 0518 by Philipp Ovens, RN Outcome: Progressing 06/03/2023 0516 by Philipp Ovens, RN Outcome: Progressing Goal: Goals will be collaboratively established with patient/family 06/03/2023 0518 by Philipp Ovens, RN Outcome: Progressing 06/03/2023 0516 by Philipp Ovens, RN Outcome: Progressing   Problem: Self-Care: Goal: Ability to participate in self-care as condition permits will improve 06/03/2023 0518 by Philipp Ovens, RN Outcome: Progressing 06/03/2023 0516 by Philipp Ovens, RN Outcome: Progressing Goal: Verbalization of  feelings and concerns over difficulty with self-care will improve 06/03/2023 0518 by Philipp Ovens, RN Outcome: Progressing 06/03/2023 0516 by Philipp Ovens, RN Outcome: Progressing Goal: Ability to communicate needs accurately will improve 06/03/2023 0518 by Philipp Ovens, RN Outcome: Progressing 06/03/2023 0516 by Philipp Ovens, RN Outcome: Progressing   Problem: Nutrition: Goal: Risk of aspiration will decrease 06/03/2023 0518 by Philipp Ovens, RN Outcome: Progressing 06/03/2023 0516 by Philipp Ovens, RN Outcome: Progressing Goal: Dietary intake will improve 06/03/2023 0518 by Philipp Ovens, RN Outcome: Progressing 06/03/2023 0516 by Philipp Ovens, RN Outcome: Progressing   Problem: Education: Goal: Knowledge of General Education information will improve Description: Including pain rating scale, medication(s)/side effects and non-pharmacologic comfort measures 06/03/2023 0518 by Philipp Ovens, RN Outcome: Progressing 06/03/2023 0516 by Philipp Ovens, RN Outcome: Progressing   Problem: Health Behavior/Discharge Planning: Goal: Ability to manage health-related needs will improve 06/03/2023 0518 by Philipp Ovens, RN Outcome: Progressing 06/03/2023 0516 by Philipp Ovens, RN Outcome: Progressing   Problem: Clinical Measurements: Goal: Ability to maintain clinical measurements within normal limits will improve 06/03/2023 0518 by Philipp Ovens, RN Outcome: Progressing 06/03/2023 0516 by Philipp Ovens, RN Outcome: Progressing Goal: Will remain free from infection 06/03/2023 0518 by Philipp Ovens, RN Outcome: Progressing 06/03/2023 0516 by Philipp Ovens, RN Outcome: Progressing Goal: Diagnostic test results will improve 06/03/2023 0518 by Philipp Ovens, RN Outcome: Progressing 06/03/2023 0516 by Philipp Ovens, RN Outcome: Progressing Goal: Respiratory complications will improve 06/03/2023 0518 by  Philipp Ovens, RN Outcome: Progressing 06/03/2023 0516 by Philipp Ovens, RN Outcome: Progressing Goal: Cardiovascular complication will be avoided 06/03/2023 0518 by Philipp Ovens, RN Outcome: Progressing 06/03/2023 0516 by Philipp Ovens, RN Outcome: Progressing   Problem: Activity: Goal: Risk for activity intolerance will decrease 06/03/2023 0518 by Philipp Ovens, RN Outcome: Progressing  06/03/2023 0516 by Philipp Ovens, RN Outcome: Progressing   Problem: Nutrition: Goal: Adequate nutrition will be maintained 06/03/2023 0518 by Philipp Ovens, RN Outcome: Progressing 06/03/2023 0516 by Philipp Ovens, RN Outcome: Progressing   Problem: Coping: Goal: Level of anxiety will decrease 06/03/2023 0518 by Philipp Ovens, RN Outcome: Progressing 06/03/2023 0516 by Philipp Ovens, RN Outcome: Progressing   Problem: Elimination: Goal: Will not experience complications related to bowel motility 06/03/2023 0518 by Philipp Ovens, RN Outcome: Progressing 06/03/2023 0516 by Philipp Ovens, RN Outcome: Progressing Goal: Will not experience complications related to urinary retention 06/03/2023 0518 by Philipp Ovens, RN Outcome: Progressing 06/03/2023 0516 by Philipp Ovens, RN Outcome: Progressing   Problem: Pain Managment: Goal: General experience of comfort will improve 06/03/2023 0518 by Philipp Ovens, RN Outcome: Progressing 06/03/2023 0516 by Philipp Ovens, RN Outcome: Progressing   Problem: Safety: Goal: Ability to remain free from injury will improve 06/03/2023 0518 by Philipp Ovens, RN Outcome: Progressing 06/03/2023 0516 by Philipp Ovens, RN Outcome: Progressing   Problem: Skin Integrity: Goal: Risk for impaired skin integrity will decrease 06/03/2023 0518 by Philipp Ovens, RN Outcome: Progressing 06/03/2023 0516 by Philipp Ovens, RN Outcome: Progressing

## 2023-06-03 NOTE — Progress Notes (Signed)
Modified Barium Swallow Study  Patient Details  Name: Eric [name] MRN: [id number] Date of Birth: 08/31/1941  Today's Date: 06/03/2023  Modified Barium Swallow completed.  Full report located under Chart Review in the Imaging Section.  History of Present Illness 82 y.o. male presents to [facility] hospital on 05/23/2023 as a transfer from [facility] after a fall. CT head suggestive of acute to subacute L parietal infarct, as well as IPH in R frontal lobe. Pt also found to have C6 vertebral body fx. PMH includes CVA, HTN. Pt is mild-moderately dysarthric   Clinical Impression Very limited study, pt unable to be appropriately positioned for visiblity. Pt unwilling to accept more than two sips of barium. Regardless, able to determine that pt has severe pharyngeal dysphagia with no hyoid excursion or PES opening with no entrance of barium into the cervical esophagus. More than 50% of bolus was aspirated with sensation but no ejection. Pt is severely weak and unable to protect airway or compensate in any way. There is no expectation that swallowing function will improve given inability to participate in activity and no nutrition. Counseled daughter about severity of dysphagia and poor prognosis for improvement with review of images. Assured daughter that pt is trying his best, but is unable to eat and drink. Hopeful that study will give family more information to guide decision making. Factors that may increase risk of adverse event in presence of aspiration Rubye Oaks & Clearance Coots 2021):    Swallow Evaluation Recommendations Recommendations:  (comfort feeding)    Harlon Ditty, MA CCC-SLP  Acute Rehabilitation Services Secure Chat Preferred Office [phone]   Claudine Mouton 06/03/2023,2:44 PM

## 2023-06-03 NOTE — Progress Notes (Addendum)
Spoke with pt's family at bedside in great depth the high risk of aspiration that pt has with eating/drinking. Informed the family that SLP recommends comfort feedings only due to the poor results of his swallow study. Also, explained to family that if pt were to code with relation to aspirating on food/drink, that staff would have to perform CPR on pt because pt's code status of full code and that it is likely that pt may suffer from fractured ribs after performing CPR due to pt's age and frail statue. Wife responded stating, "If you do CPR the right way then you won't break any ribs. I have my CPR so I know." Explained to pt's wife that wasn't an accurate statement and that all staff have ACLS certification. Also, explained to pt's family that feeding him may cause more harm than good. Family had concerns that pt wasn't getting enough hydration. MD made aware, new orders placed.

## 2023-06-03 NOTE — Progress Notes (Signed)
Family with several questions regarding patient's barium study and ability to eat and drink.  Requesting Boost at this time.  Family educated on safety issues with PO intake and dietary recommendations made by speech therapy.Family very aggressively wish to feed patient and give him PO liquids.  MD aware, charge nurse in room and discussing patient's swallowing difficulties with family and possible undesireable outcomes of allowing him PO intake.  Family requests IV fluids for hydration.  MD aware and will order.

## 2023-06-03 NOTE — Progress Notes (Addendum)
Progress Note  Primary GI: Dr. Christella Hartigan DOA: 05/22/2023         Hospital Day: 13   Subjective  Chief Complaint: Elevated LFTs  Patient with family at bedside, daughter. Provided some of the history.  I just received pain medications, no abdominal pain.    Objective   Vital signs in last 24 hours: Temp:  [97.9 F (36.6 C)-98.2 F (36.8 C)] 97.9 F (36.6 C) (10/01 0920) Pulse Rate:  [84-98] 91 (10/01 0920) Resp:  [19-32] 31 (10/01 0920) BP: (120-135)/(59-74) 135/67 (10/01 0920) SpO2:  [89 %-97 %] 91 % (10/01 0920) Weight:  [60.3 kg] 60.3 kg (10/01 0428) Last BM Date : 05/26/23 Last BM recorded by nurses in past 5 days No data recorded  General:   Chronically ill-appearing AA male with soft collar in place Heart:  regular rate and rhythm, no murmurs or gallops Pulm: Clear anteriorly; no wheezing Abdomen:  Soft, thin AB, Sluggish bowel sounds. No tenderness Extremities:  Without edema. Msk:  Symmetrical without gross deformities. Peripheral pulses intact.  Neurologic:  Alert and  oriented x1, oriented to self only at this time;more alert today Skin: Ecchymoses bilateral arms Psychiatric:  Cooperative.  Lethargic  Intake/Output from previous day: 09/30 0701 - 10/01 0700 In: 0  Out: 700 [Urine:700] Intake/Output this shift: No intake/output data recorded.  Studies/Results: DG Chest Port 1 View  Result Date: 06/03/2023 CLINICAL DATA:  Shortness of breath. EXAM: PORTABLE CHEST 1 VIEW COMPARISON:  06/01/2023 FINDINGS: There is interval progression of medial right base collapse/consolidation with persistent airspace disease at the left base. The cardiopericardial silhouette is within normal limits for size. No pulmonary edema or substantial pleural effusion. Gaseous distention of the colon again noted upper abdomen. IMPRESSION: Interval progression of medial right base collapse/consolidation with persistent airspace disease at the left base. Electronically Signed   By: Kennith Center M.D.   On: 06/03/2023 06:43    Lab Results: Recent Labs    06/01/23 1145 06/02/23 0319 06/03/23 0315  WBC 11.0* 10.4 13.1*  HGB 11.9* 13.2 12.6*  HCT 34.8* 38.0* 36.7*  PLT 323 363 401*   BMET Recent Labs    06/01/23 1145 06/02/23 0319 06/03/23 0315  NA 139 140 142  K 3.6 3.7 4.0  CL 102 106 107  CO2 23 21* 20*  GLUCOSE 123* 96 146*  BUN 21 23 36*  CREATININE 2.19* 2.47* 3.26*  CALCIUM 8.6* 8.7* 8.8*   LFT Recent Labs    06/03/23 0315  PROT 6.1*  ALBUMIN 2.0*  AST 349*  ALT 513*  ALKPHOS 148*  BILITOT 1.1   PT/INR Recent Labs    06/02/23 1352 06/03/23 0315  LABPROT 22.3* 23.1*  INR 1.9* 2.0*     Scheduled Meds:  apixaban  5 mg Oral BID   carvedilol  6.25 mg Oral BID WC   Chlorhexidine Gluconate Cloth  6 each Topical Daily   finasteride  5 mg Oral Daily   mirtazapine  15 mg Oral QHS   [START ON 06/05/2023] rosuvastatin  10 mg Oral Daily   tamsulosin  0.4 mg Oral Daily   Continuous Infusions:  meropenem (MERREM) IV 500 mg (06/03/23 1001)     Impression/Plan:   Elevated LFTs Pattern compatible with hepatocellular injury, elevated LFTs starting 9/23 Hepatocellular most likely related to Dili AST 521-->349 ALT 518-->513 Alkphos 135--148 TBili 0.9--->1.1 05/30/2023 INR 1.5, Eliquis last dose 09/29, 10/01 INR 2.0- some coagulopathy RUQ [place]  9/27 unremarkable, no evidence of biliary obstruction  Crestor 40 mg 9/21  last dose 05/31/2023 Unasyn started 09/23, last dose 09/29 completed Acute hepatitis panel negatives, No events of hypotension   Most likely representing DILI from Unasyn, stopped 9/29, AST trending down today, can expect increasing alk phos and bili to follow. Continue to monitor LFTs Avoid hepatotoxic medications Mild coagulopathy Eliquis has been on hold, continue to monitor INR, ammonia yesterday normal-? 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

## 2023-06-03 NOTE — Progress Notes (Signed)
Occupational Therapy Treatment Patient Details Name: Eric [name] MRN: [id number] DOB: 08-15-1941 Today's Date: 06/03/2023   History of present illness 82 y.o. male presents to [facility] hospital on 05/23/2023 as a transfer from [facility] ED after a fall. CT head suggestive of acute to subacute L parietal infarct, as well as IPH in R frontal lobe. 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 Pt also found to have C6 vertebral body fx. PMH includes CVA (02/2023), HTN.   OT comments  Pt at this time was very lethargic but with increase in stimulus started to open eyes and answering some yes/no questions. Pt reported they were in a motel at this time and was not sure who was present in the room with them (daughter in room). Pt was able to complete hand over hand face washing at bed level with max assist. Pt attempted to roll to L and R side but would tense in pain with max hand over hand assist to locate bed rails. At this time Mr. Sorg is requiring max to total x2 assist with all ADLS at bed level. Pt's daughter was educated about light AAROM at bed level if medically stable at this time. Acute Occupational Therapy will continue to follow.       If plan is discharge home, recommend the following:  Two people to help with walking and/or transfers;A lot of help with bathing/dressing/bathroom;Assistance with feeding;Assistance with cooking/housework;Direct supervision/assist for financial management;Supervision due to cognitive status;Direct supervision/assist for medications management;Help with stairs or ramp for entrance   Equipment Recommendations  Other (comment) (TBD at next level of care)    Recommendations for Other Services      Precautions / Restrictions Precautions Precautions: Fall Required Braces or Orthoses: Other Brace Other Brace: soft cervical collar for comfort Restrictions Weight Bearing Restrictions: No       Mobility Bed  Mobility Overal bed mobility: Needs Assistance Bed Mobility: Rolling Rolling: Max assist, Total assist         General bed mobility comments: due to level of arousal difficulty to follow tasks at this time or pain would start and then pt would tense up    Transfers                   General transfer comment: Pt unable to complete at this time due to pain and following cues     Balance                                           ADL either performed or assessed with clinical judgement   ADL Overall ADL's : Needs assistance/impaired Eating/Feeding: Total assistance;Sitting Eating/Feeding Details (indicate cue type and reason): Per daughter has not had anything to eat Grooming: Maximal assistance;Sitting;Bed level Grooming Details (indicate cue type and reason): hand over hand started wipe face some but very limited Upper Body Bathing: Maximal assistance;Total assistance;Bed level Upper Body Bathing Details (indicate cue type and reason): decrease in coordination of task and noted to be rotated to L side Lower Body Bathing: Total assistance;+2 for physical assistance;+2 for safety/equipment;Bed level   Upper Body Dressing : Maximal assistance;Bed level   Lower Body Dressing: Total assistance;+2 for physical assistance;+2 for safety/equipment;Bed level                      Extremity/Trunk Assessment Upper Extremity Assessment  Upper Extremity Assessment: Generalized weakness   Lower Extremity Assessment Lower Extremity Assessment: Defer to PT evaluation        Vision   Vision Assessment?: Vision impaired- to be further tested in functional context Additional Comments: When speaking with family they believe he can not see out of R eye at all. In therapy session noted to not to attend to R side and noted could not see anything when asked to R side. Pt could not track with L side but opened eyes with increase in lighting and multiple cues to keep  eyes open in session   Perception     Praxis      Cognition Arousal: Lethargic Behavior During Therapy: Flat affect Overall Cognitive Status: Impaired/Different from baseline Area of Impairment: Attention, Following commands, Safety/judgement, Problem solving                 Orientation Level: Disoriented to, Place, Situation, Time Current Attention Level: Sustained   Following Commands: Follows one step commands inconsistently Safety/Judgement: Decreased awareness of safety, Decreased awareness of deficits   Problem Solving: Slow processing, Requires verbal cues, Requires tactile cues General Comments: Pt reported they are in a motel at the time and through their wife was with them but was daughter present at the time        Exercises General Exercises - Upper Extremity Shoulder Flexion: AAROM, Both, 10 reps, Supine Elbow Flexion: AAROM, Both, 10 reps, Supine Digit Composite Flexion: AAROM, AROM, 10 reps, Supine    Shoulder Instructions       General Comments      Pertinent Vitals/ Pain       Pain Assessment Pain Assessment: Faces Faces Pain Scale: Hurts little more Breathing: normal Negative Vocalization: occasional moan/groan, low speech, negative/disapproving quality Facial Expression: sad, frightened, frown Body Language: tense, distressed pacing, fidgeting Consolability: no need to console PAINAD Score: 3 Pain Location: back, RLE Pain Descriptors / Indicators: Discomfort, Grimacing, Guarding, Aching Pain Intervention(s): Limited activity within patient's tolerance, Monitored during session, Repositioned  Home Living                                          Prior Functioning/Environment              Frequency  Min 2X/week        Progress Toward Goals  OT Goals(current goals can now be found in the care plan section)  Progress towards OT goals: Progressing toward goals  Acute Rehab OT Goals Patient Stated Goal: none  reported by pt OT Goal Formulation: With patient/family Time For Goal Achievement: 06/11/23 Potential to Achieve Goals: Fair ADL Goals Pt Will Perform Upper Body Bathing: bed level;with set-up Additional ADL Goal #1: Pt will demonstrate improved attention to R side by using scanning strategies to locate 3/3 objects on that side Additional ADL Goal #2: Pt will tolerate 5 mins sitting EOB with min A in preparation for seated bADLs. Additional ADL Goal #3: Pt family will demonstrate independence with rolling pt L<>R in bed for pressure relief.  Plan      Co-evaluation                 AM-PAC OT "6 Clicks" Daily Activity     Outcome Measure   Help from another person eating meals?: Total Help from another person taking care of personal grooming?: A Lot Help from another person toileting,  which includes using toliet, bedpan, or urinal?: Total Help from another person bathing (including washing, rinsing, drying)?: Total Help from another person to put on and taking off regular upper body clothing?: Total Help from another person to put on and taking off regular lower body clothing?: Total 6 Click Score: 7    End of Session    OT Visit Diagnosis: Unsteadiness on feet (R26.81);Other abnormalities of gait and mobility (R26.89);Muscle weakness (generalized) (M62.81);Low vision, both eyes (H54.2);Pain Pain - Right/Left: Right Pain - part of body:  (back)   Activity Tolerance Patient limited by lethargy   Patient Left in bed;with bed alarm set;with family/visitor present;with call bell/phone within reach   Nurse Communication Mobility status        Time: 5188-4166 OT Time Calculation (min): 30 min  Charges: OT General Charges $OT Visit: 1 Visit OT Treatments $Self Care/Home Management : 23-37 mins  Presley Raddle OTR/L  Acute Rehab Services  [phone] office number   Alphia Moh 06/03/2023, 9:30 AM

## 2023-06-03 NOTE — Progress Notes (Signed)
SLP Note  Patient Details Name: Eric [name] MRN: [id number] DOB: 05-02-1941   MBS planned for 1pm. Daughter or other family member encouraged to attend. Discussed briefly with daughter that if she decided she did not want the test, just let RN know.    Dayanne Yiu, Riley Nearing 06/03/2023, 9:47 AM

## 2023-06-03 NOTE — Progress Notes (Signed)
       Daughter in room He does not open his eyes or move his extremities when asked He just received pain medication per RN    B LE warm to touch without ischemic changes Lungs non labored breathing  Chart check  Cr continues to increase now 3.26, K+ 4.0, Urine OP 700 ml recorded Leukocytosis 13.1 Pending CR baseline plan to repeat CTA  Assessment/Planning: 82 year old male that vascular was consulted for 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  Continue anticoagulation now on oral Eliquis No vascular intervention is indicated Pending timing for repeat CTA.

## 2023-06-04 ENCOUNTER — Inpatient Hospital Stay (HOSPITAL_COMMUNITY): Payer: Medicare Other

## 2023-06-04 ENCOUNTER — Ambulatory Visit: Payer: Medicare Other | Admitting: Internal Medicine

## 2023-06-04 DIAGNOSIS — W19XXXA Unspecified fall, initial encounter: Secondary | ICD-10-CM | POA: Diagnosis not present

## 2023-06-04 DIAGNOSIS — I639 Cerebral infarction, unspecified: Secondary | ICD-10-CM | POA: Diagnosis not present

## 2023-06-04 DIAGNOSIS — N400 Enlarged prostate without lower urinary tract symptoms: Secondary | ICD-10-CM | POA: Diagnosis not present

## 2023-06-04 DIAGNOSIS — S0636AA Traumatic hemorrhage of cerebrum, unspecified, with loss of consciousness status unknown, initial encounter: Secondary | ICD-10-CM | POA: Diagnosis not present

## 2023-06-04 DIAGNOSIS — S12501A Unspecified nondisplaced fracture of sixth cervical vertebra, initial encounter for closed fracture: Secondary | ICD-10-CM | POA: Diagnosis not present

## 2023-06-04 DIAGNOSIS — I629 Nontraumatic intracranial hemorrhage, unspecified: Secondary | ICD-10-CM | POA: Diagnosis not present

## 2023-06-04 DIAGNOSIS — G9340 Encephalopathy, unspecified: Secondary | ICD-10-CM | POA: Diagnosis not present

## 2023-06-04 LAB — COMPREHENSIVE METABOLIC PANEL
ALT: 370 U/L — ABNORMAL HIGH (ref 0–44)
AST: 239 U/L — ABNORMAL HIGH (ref 15–41)
Albumin: 1.7 g/dL — ABNORMAL LOW (ref 3.5–5.0)
Alkaline Phosphatase: 149 U/L — ABNORMAL HIGH (ref 38–126)
Anion gap: 14 (ref 5–15)
BUN: 55 mg/dL — ABNORMAL HIGH (ref 8–23)
CO2: 21 mmol/L — ABNORMAL LOW (ref 22–32)
Calcium: 8.5 mg/dL — ABNORMAL LOW (ref 8.9–10.3)
Chloride: 109 mmol/L (ref 98–111)
Creatinine, Ser: 4.88 mg/dL — ABNORMAL HIGH (ref 0.61–1.24)
GFR, Estimated: 11 mL/min — ABNORMAL LOW (ref >60)
Glucose, Bld: 183 mg/dL — ABNORMAL HIGH (ref 70–99)
Potassium: 4.5 mmol/L (ref 3.5–5.1)
Sodium: 144 mmol/L (ref 135–145)
Total Bilirubin: 1 mg/dL (ref 0.3–1.2)
Total Protein: 5.9 g/dL — ABNORMAL LOW (ref 6.5–8.1)

## 2023-06-04 LAB — CBC WITH DIFFERENTIAL/PLATELET
Abs Immature Granulocytes: 0 10*3/uL (ref 0.00–0.07)
Basophils Absolute: 0 10*3/uL (ref 0.0–0.1)
Basophils Relative: 0 %
Eosinophils Absolute: 0 10*3/uL (ref 0.0–0.5)
Eosinophils Relative: 0 %
HCT: 33.5 % — ABNORMAL LOW (ref 39.0–52.0)
Hemoglobin: 11.7 g/dL — ABNORMAL LOW (ref 13.0–17.0)
Lymphocytes Relative: 5 %
Lymphs Abs: 1.2 10*3/uL (ref 0.7–4.0)
MCH: 27 pg (ref 26.0–34.0)
MCHC: 34.9 g/dL (ref 30.0–36.0)
MCV: 77.2 fL — ABNORMAL LOW (ref 80.0–100.0)
Monocytes Absolute: 0.9 10*3/uL (ref 0.1–1.0)
Monocytes Relative: 4 %
Neutro Abs: 21.3 10*3/uL — ABNORMAL HIGH (ref 1.7–7.7)
Neutrophils Relative %: 91 %
Platelets: 420 10*3/uL — ABNORMAL HIGH (ref 150–400)
RBC: 4.34 MIL/uL (ref 4.22–5.81)
RDW: 14.6 % (ref 11.5–15.5)
WBC: 23.4 10*3/uL — ABNORMAL HIGH (ref 4.0–10.5)
nRBC: 0 % (ref 0.0–0.2)
nRBC: 0 /100{WBCs}

## 2023-06-04 LAB — MAGNESIUM: Magnesium: 2.4 mg/dL (ref 1.7–2.4)

## 2023-06-04 LAB — BRAIN NATRIURETIC PEPTIDE: B Natriuretic Peptide: 56.5 pg/mL (ref 0.0–100.0)

## 2023-06-04 LAB — C-REACTIVE PROTEIN: CRP: 36.2 mg/dL — ABNORMAL HIGH (ref <1.0)

## 2023-06-04 LAB — PROCALCITONIN: Procalcitonin: 6.25 ng/mL

## 2023-06-04 MED ORDER — SODIUM CHLORIDE 0.45 % IV SOLN: INTRAVENOUS | Status: DC

## 2023-06-04 MED ORDER — FENTANYL CITRATE PF 50 MCG/ML IJ SOSY
25.0000 ug | PREFILLED_SYRINGE | INTRAMUSCULAR | Status: DC | PRN
  Administered 2023-06-04 – 2023-06-13 (×20): 25 ug via INTRAVENOUS
  Filled 2023-06-04 (×23): qty 1

## 2023-06-04 NOTE — TOC Progression Note (Signed)
Transition of Care [facility]) - Progression Note    Patient Details  Name: Eric [name] MRN: [id number] Date of Birth: [birth year]  [facility] CM/SW Contact  Mearl Latin, LCSW Phone Number: 06/04/2023, 9:39 AM  Clinical Narrative:    CSW continuing to follow for needs.    Expected Discharge Plan: Skilled Nursing Facility Barriers to Discharge: Continued Medical Work up, English as a second language teacher, SNF Pending bed offer  Expected Discharge Plan and Services In-house Referral: Clinical Social Work     Living arrangements for the past 2 months: Single Family Home                                       Social Determinants of Health (SDOH) Interventions SDOH Screenings   Food Insecurity: No Food Insecurity (05/30/2023)  Housing: Low Risk  (05/30/2023)  Transportation Needs: No Transportation Needs (05/30/2023)  Utilities: Not At Risk (05/30/2023)  Depression (PHQ2-9): Low Risk  (04/17/2023)  Tobacco Use: Medium Risk (05/22/2023)    Readmission Risk Interventions     No data to display

## 2023-06-04 NOTE — Progress Notes (Signed)
Daily Progress Note   Patient Name: Eric [name]       Date: 06/04/2023 DOB: 1940/12/26  Age: 82 y.o. MRN#: [id number] Attending Physician: Eric Arms, MD Primary Care Physician: Eric Garter, MD Admit Date: 05/22/2023  Reason for Consultation/Follow-up: Establishing goals of care  Subjective: I have reviewed medical records including EPIC notes, MAR, and labs. Patient failed swallow study yesterday - SLP for MBS recommends comfort feeds. Received report from primary RN - no acute concerns. RN reports patient remains lethargic.  Went to visit patient at bedside - son/Eric [name] present. Patient was lying in bed asleep - he does not wake to voice/gentle touch. No signs or non-verbal gestures of pain or discomfort noted. No respiratory distress, increased work of breathing, or secretions noted. He is frail and ill appearing. C collar in use.  Emotional support provided to [facility]. Therapeutic listening provided as he reflects on the information given to him by Dr. Randol [name] this morning. Eric [name] expresses understanding patient's prognosis is poor. Recommended another family meeting soon to discuss next steps/goals. Eric [name] will speak with his siblings - they will call PMT with a preferred meeting date/time.  All questions and concerns addressed. Encouraged to call with questions and/or concerns. PMT card previously provided.  Length of Stay: 13  Current Medications: Scheduled Meds:   apixaban  5 mg Oral BID   carvedilol  6.25 mg Oral BID WC   Chlorhexidine Gluconate Cloth  6 each Topical Daily   finasteride  5 mg Oral Daily   mirtazapine  15 mg Oral QHS   [START ON 06/05/2023] rosuvastatin  10 mg Oral Daily   tamsulosin  0.4 mg Oral Daily    Continuous Infusions:   dextrose 5 % and 0.45 % NaCl with KCl 10 mEq/L 75 mL/hr at 06/04/23 0942   meropenem (MERREM) IV 500 mg (06/04/23 0940)    PRN Meds: acetaminophen **OR** acetaminophen, hydrALAZINE, labetalol, melatonin, naLOXone (NARCAN)  injection, ondansetron (ZOFRAN) IV, polyethylene glycol  Physical Exam Vitals and nursing note reviewed.  Constitutional:      General: He is not in acute distress.    Appearance: He is cachectic. He is ill-appearing.  Pulmonary:     Effort: No respiratory distress.  Skin:    General: Skin is warm and dry.  Neurological:     Mental Status:  He is lethargic.     Motor: Weakness present.             Vital Signs: BP 123/73 (BP Location: Left Arm)   Pulse 97   Temp 98.9 F (37.2 C)   Resp 20   Ht 5\' 8"  (1.727 m)   Wt 57.6 kg   SpO2 94%   BMI 19.31 kg/m  SpO2: SpO2: 94 % O2 Device: O2 Device: Nasal Cannula O2 Flow Rate: O2 Flow Rate (L/min): 2 L/min  Intake/output summary:  Intake/Output Summary (Last 24 hours) at 06/04/2023 1219 Last data filed at 06/04/2023 0500 Gross per 24 hour  Intake 901.27 ml  Output 100 ml  Net 801.27 ml   LBM: Last BM Date : 05/26/23 Baseline Weight: Weight: 61 kg Most recent weight: Weight: 57.6 kg       Palliative Assessment/Data: PPS 10% - NPO      Patient Active Problem List   Diagnosis Date Noted   Elevated liver enzymes 06/02/2023   Drug-induced liver injury 06/02/2023   Acute encephalopathy 05/23/2023   C6 cervical fracture (HCC) 05/23/2023   Elevated troponin 05/23/2023   BPH (benign prostatic hyperplasia) 05/23/2023   Acute ischemic stroke (HCC) 05/22/2023   Confusion 03/13/2023   Abnormal CXR 02/23/2023   Subarachnoid bleed (HCC) 02/22/2023   CVA (cerebral vascular accident) (HCC) 02/22/2023   Anemia 01/02/2023   Restless legs syndrome (RLS) 04/24/2021   Abnormal ultrasound of bladder 12/25/2020   UTI (urinary tract infection) 09/25/2020   Cervical pain (neck) 05/15/2018   Abdominal bloating  05/15/2018   Arm pain, diffuse, left 12/31/2017   Well adult exam 07/30/2017   Vitamin D deficiency 12/18/2016   Osteoarthritis 12/18/2016   B12 deficiency 09/16/2016   Hypokalemia 08/16/2016   Dizziness and giddiness 11/15/2015   Blurred vision 11/15/2015   Headache 07/07/2013   Hyperlipemia 07/07/2013   Acute intracranial hemorrhage (HCC) 07/03/2013   Pain of both eyes 07/02/2013   Acute bronchitis 07/02/2013   ERECTILE DYSFUNCTION, 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 male with past medical history of stroke, essential pretension, who was admitted to [facility] on 05/22/2023 with suspected acute versus subacute ischemic stroke after presenting from home to [facility] ED for evaluation of fall.   Assessment: Principal Problem:   Acute ischemic stroke [facility]) Active Problems:   Essential hypertension   Acute intracranial hemorrhage (HCC)   Acute encephalopathy   C6 cervical fracture (HCC)   Elevated troponin   BPH (benign prostatic hyperplasia)   Elevated liver enzymes   Drug-induced liver injury   Concern about end of life  Recommendations/Plan: Continue full code/full scope Patient is likely approaching end of life - recommended another family meeting. Family will call PMT with preferred date/time No PEG per previous discussions with family PMT will continue to follow and support holistically  Goals of Care and Additional Recommendations: Limitations on Scope of Treatment: Full Scope Treatment  Code Status:    Code Status Orders  (From admission, onward)           Start     Ordered   05/22/23 2315  Full code  Continuous       Question:  By:  Answer:  Consent: discussion documented in EHR   05/22/23 2314           Code Status History  Date Active Date Inactive Code Status Order ID Comments User  Context   02/22/2023 2231 02/24/2023 1819 Full Code [id number]  Therisa Doyne, MD ED       Prognosis:  < 2 weeks  Discharge Planning: To Be Determined  Care plan was discussed with primary RN, patient's son  Thank you for allowing the Palliative Medicine Team to assist in the care of this patient.   Total Time 35 minutes Prolonged Time Billed  no       Eric Khan, NP  Please contact Palliative Medicine Team phone at [phone] for questions and concerns.   *Portions of this note are a verbal dictation therefore any spelling and/or grammatical errors are due to the "[employer]agon [employer] One" system interpretation.

## 2023-06-04 NOTE — Consult Note (Addendum)
[facility] Resident Consultation Note  Requesting MD: Starleen Arms, MD Indication for Consultation:  AKI, oliguria  Chief complaint: Fall  HPI:  Eric [name] is a 82 y.o. male admitted for stroke after presenting for fall at home. 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 Nephrology consulted for help with AKI and goals of care.  Family at bedside. Son is able to relate much of the history as this patient is unable to communicate except for head nodding/shaking. Eric [name] has been declining for weeks prior to hospitalization. He has been declining food. He has been confused. On 9/19 he had a fall at home for which his son brought him to ED for care. Workup revealed left parietal infarct and 0.9 cm focus of intraparenchymal hemorrhage. Unfortunately he has deteriorated into multiorgan dysfunction. He had liver injury with coagulopathy. He had renal artery thrombosis and signs of right renal infarction. He has become less responsive over the last few days. Creatinine trend per below.  Today, he shakes head no when asked if he's in pain. He nods yes when asked if he is comfortable.   Creatinine, Ser  Date/Time Value Ref Range Status  06/04/2023 03:13 AM 4.88 (H) 0.61 - 1.24 mg/dL Final  09/81/1914 78:29 AM 3.26 (H) 0.61 - 1.24 mg/dL Final  56/21/3086 57:84 AM 2.47 (H) 0.61 - 1.24 mg/dL Final  69/62/9528 41:32 AM 2.19 (H) 0.61 - 1.24 mg/dL Final  44/09/270 53:66 AM 1.98 (H) 0.61 - 1.24 mg/dL Final  44/10/4740 59:56 AM 1.93 (H) 0.61 - 1.24 mg/dL Final  38/75/6433 29:51 AM 1.97 (H) 0.61 - 1.24 mg/dL Final  88/41/6606 30:16 AM 1.83 (H) 0.61 - 1.24 mg/dL Final  09/10/3233 57:32 AM 1.92 (H) 0.61 - 1.24 mg/dL Final  20/25/4270 62:37 AM 1.88 (H) 0.61 - 1.24 mg/dL Final  62/83/1517 61:60 PM 1.46 (H) 0.61 - 1.24 mg/dL Final  73/71/0626 94:85 AM 1.22 0.61 - 1.24 mg/dL Final   46/27/0350 09:38 AM 0.99 0.61 - 1.24 mg/dL Final  18/29/9371 69:67 PM 0.97 0.61 - 1.24 mg/dL Final  89/38/1017 51:02 AM 1.16 0.61 - 1.24 mg/dL Final  58/52/7782 42:35 PM 1.03 0.40 - 1.50 mg/dL Final  36/14/4315 40:08 AM 0.85 0.61 - 1.24 mg/dL Final  67/61/9509 32:67 PM 0.90 0.61 - 1.24 mg/dL Final  12/45/8099 83:38 PM 0.93 0.61 - 1.24 mg/dL Final  25/01/3975 73:41 PM 0.97 0.40 - 1.50 mg/dL Final  93/79/0240 97:35 AM 0.81 0.40 - 1.50 mg/dL Final  32/99/2426 83:41 PM 0.87 0.40 - 1.50 mg/dL Final  96/22/2979 89:21 PM 0.91 0.40 - 1.50 mg/dL Final  19/41/7408 14:48 PM 0.97 0.40 - 1.50 mg/dL Final  18/56/3149 70:26 AM 1.08 0.40 - 1.50 mg/dL Final  37/85/8850 27:74 PM 0.99 0.40 - 1.50 mg/dL Final  12/87/8676 72:09 PM 0.92 0.40 - 1.50 mg/dL Final  47/05/6282 66:29 PM 1.06 0.61 - 1.24 mg/dL Final  47/65/4650 35:46 PM 0.94 0.40 - 1.50 mg/dL Final  56/81/2751 70:01 AM 1.1 0.4 - 1.5 mg/dL Final  74/94/4967 59:16 AM 1.20 0.50 - 1.35 mg/dL Final  38/46/6599 35:70 PM 0.73 0.50 - 1.35 mg/dL Final  17/79/3903 00:92 AM 0.90 0.50 - 1.35 mg/dL Final  33/00/7622 63:33 AM 1.0 0.4 - 1.5 mg/dL Final  54/56/2563 89:37 AM 1.1 0.4 - 1.5 mg/dL Final  34/28/7681 15:72 AM 0.9 0.4 - 1.5 mg/dL Final  62/11/5595 41:63 AM 1.0 0.4 - 1.5 mg/dL Final  84/53/6468 03:21 AM 1.0  0.4 - 1.5 mg/dL Final  25/36/6440 34:74 AM 1.1 0.4 - 1.5 mg/dL Final  25/95/6387 56:43 AM 1.1 0.4 - 1.5 mg/dL Final     PMHx: 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 His smoking use included cigarettes. He has never used smokeless tobacco. He reports that he does not drink alcohol and does not use drugs.  Allergies:  Allergies  Allergen Reactions   Hctz [Hydrochlorothiazide] Other (See Comments)    "Peeing too much"    Medications: Prior to Admission medications   Medication Sig Start Date End Date Taking? Authorizing Provider  acetaminophen (TYLENOL) 650 MG CR tablet Take 650 mg by mouth at bedtime as needed (for back pain). For arthritis in back   Yes [provider]  amLODipine-benazepril (LOTREL) 5-20 MG capsule TAKE 1 CAPSULE BY MOUTH EVERY DAY Patient taking differently: Take 1 capsule by mouth at bedtime. 10/28/22  Yes Plotnikov, Georgina Quint, MD  BAYER LOW DOSE 81 MG tablet Take 81 mg by mouth in the morning. Swallow whole.   Yes [provider]  Cyanocobalamin (VITAMIN B-12) 1000 MCG SUBL Place 1 tablet (1,000 mcg total) under the tongue daily. Patient taking differently: Place 1 tablet under the tongue at bedtime. 04/24/21  Yes Plotnikov, Georgina Quint, MD  ferrous sulfate 325 (65 FE) MG tablet Take 1 tablet (325 mg total) by mouth 2 (two) times daily with a meal. Patient taking differently: Take 325 mg by mouth at bedtime. 12/29/22 12/29/23 Yes Plotnikov, Georgina Quint, MD  finasteride (PROSCAR) 5 MG tablet Take 1 tablet (5 mg total) by mouth daily. 06/26/22  Yes Plotnikov, Georgina Quint, MD  oxybutynin (DITROPAN-XL) 5 MG 24 hr tablet TAKE 1 TABLET (5 MG TOTAL) BY MOUTH AT BEDTIME. FOR FREQUENT URINATION 05/14/23  Yes Plotnikov, Georgina Quint, MD  potassium chloride SA (KLOR-CON M) 20 MEQ tablet Take 1 tablet (20 mEq total) by mouth daily. 04/18/23  Yes Plotnikov, Georgina Quint, MD  rosuvastatin (CRESTOR) 20 MG tablet Take 1 tablet (20 mg total) by mouth daily. 02/25/23  Yes Rai, Ripudeep K, MD  traMADol (ULTRAM) 50 MG tablet TAKE 1 TABLET 3 TIMES A DAY AS  NEEDED SEVERE PAIN Patient taking differently: Take 50 mg by mouth 3 (three) times daily. 02/14/23  Yes Plotnikov, Georgina Quint, MD  aspirin EC 81 MG tablet Take 1 tablet (81 mg total) by mouth daily. Swallow whole. Patient not taking: Reported on 05/22/2023 02/25/23   Rai, Delene Ruffini, MD  pantoprazole (PROTONIX) 40 MG tablet Take 1 tablet (40 mg total) by mouth daily. 02/24/23   Rai, Delene Ruffini, MD    I have reviewed the patient's current medications.  Labs:     Latest Ref Rng & Units 06/04/2023    3:13 AM 06/03/2023    3:15 AM  06/02/2023    3:19 AM  BMP  Glucose 70 - 99 mg/dL 161  096  96   BUN 8 - 23 mg/dL 55  36  23   Creatinine 0.61 - 1.24 mg/dL 0.45  4.09  8.11   Sodium 135 - 145 mmol/L 144  142  140   Potassium 3.5 - 5.1 mmol/L 4.5  4.0  3.7   Chloride 98 - 111 mmol/L 109  107  106   CO2 22 - 32 mmol/L 21  20  21    Calcium 8.9 - 10.3 mg/dL 8.5  8.8  8.7     Urinalysis    Component Value Date/Time   COLORURINE YELLOW 05/26/2023 1044   APPEARANCEUR HAZY (A) 05/26/2023 1044   LABSPEC 1.014 05/26/2023 1044   PHURINE 5.0 05/26/2023 1044   GLUCOSEU 50 (A) 05/26/2023 1044   GLUCOSEU NEGATIVE 06/26/2022 0934   HGBUR LARGE (A) 05/26/2023 1044   HGBUR negative 06/01/2010 1023   BILIRUBINUR NEGATIVE 05/26/2023 1044   BILIRUBINUR n 08/15/2015 1530   KETONESUR 20 (A) 05/26/2023 1044   PROTEINUR 100 (A) 05/26/2023 1044   UROBILINOGEN 0.2 06/26/2022 0934   NITRITE NEGATIVE 05/26/2023 1044   LEUKOCYTESUR LARGE (A) 05/26/2023 1044     ROS:  Pertinent items are noted in HPI.  Physical Exam: Vitals:   06/04/23 0752 06/04/23 1208  BP: (!) 118/57 123/73  Pulse: 97   Resp: 20   Temp: 99.8 F (37.7 C) 98.9 F (37.2 C)  SpO2: 95% 94%      No distress, cachectic, ill-appearing Heart rate is tachycardic, rhythm is regular, radial pulse is strong, heart sounds distant Tachypnea with deep breathing, anterior lung fields distant but clear Skin warm and dry No lower extremity  edema Foley draining scant yellow urine Alert to voice but doesn't open yes, shakes head yes/no for questions, not really following instructions on my exam  Assessment/Plan:  Principal Problem:   Acute ischemic stroke (HCC) Active Problems:   Essential hypertension   Acute intracranial hemorrhage (HCC)   Acute encephalopathy   C6 cervical fracture (HCC)   Elevated troponin   BPH (benign prostatic hyperplasia)   Elevated liver enzymes   Drug-induced liver injury  82 year old with history of CVA who was admitted after fall at home for stroke, with complicated hospital course notable for multiorgan dysfunction and acute renal failure.  Acute renal failure Trending towards anuric. In setting of acute critical illness, right renal artery thrombosis with signs of renal infarction on imaging, and no p.o. intake from severe oropharyngeal dysphagia. Also several contrasted studies during this hospitalization. I fear a very poor prognosis for this person in the short term, and I do not think that renal replacement therapy would prolong life or promote quality of life and thus would not recommend initiation. Will start gentle IV fluid hydration with 1/2 NS at 75 mL/h x 24 h.  Aortic thrombus with renal artery thrombosis Now on anticoagulation. Right renal artery occluded, left renal widely patent, but worsening creatinine despite this. VVS following.   Liver injury, likely drug induced Thought secondary to ampicillin. Complicated by coagulopathy. Increasing INR.  Aspiration pneumonia Note worsening leukocytosis, infiltrates on CXR, in setting of severe oropharyngeal dysphagia. On meropenem per primary.  Acute CVA Primary problem on admission. Thought to be of embolic etiology. On Eliquis.  Urinary retention Foley in place.  Goals of care Expressed concern to family and patient that this is not a survivable illness. Would not recommend initiation of  RRT as it would not serve to prolong  life or promote quality of life. Based on son's report, Eric [name] enjoyed a good quality of life until recently and seemed to be suffering as a result. Son agrees that he would not want life prolonged in this state if there were no chance for recovery. He still holds hope that he will improve, but I reiterated my concern that this is not likely a survivable illness despite the aggressive and appropriate care provided to date.  Final recommendations pending attending attestation.  Marrianne Mood MD 06/04/2023, 2:46 PM    Seen and examined independently.  Agree with note and exam as documented above by resident physician Dr. Benito Mccreedy and as noted here.  Eric [name] is a 82 year old gentleman with a history of stroke and hypertension who presented to the hospital after a fall at home.  He was found to have a C6 vertebral body fracture but was also noted to have acute CVA.  He was transferred from [facility] to [place].  His course has unfortunately been complicated by aspiration pneumonia, liver injury, coagulopathy, aortic thrombus, and right renal infarct.  He been noted to have worsening acute renal failure and nephrology is consulted for assistance with management of the same.  He has had dysphagia and has not eaten much.  His son does not want him to suffer and his daughter does not want to give up on him.  He mentions that one of his children would be willing to give him a kidney transplant and we thoughtfully discussed that this is not a procedure that we would recommend given his age and health as his body would not tolerate that.    General elderly gentleman in bed in no acute distress, frail HEENT he is in a neck brace; eyes are most often closed  Lungs clear to auscultation bilaterally normal work of breathing at rest  Heart regular rate and rhythm no rubs or gallops appreciated Abdomen soft nontender nondistended, thin Extremities no edema  Psych no anxiety or agitation GU foley in  place  # AKI  - Multifactorial with ischemic and prerenal insults.  Has received clinically indicated contrasted studies. He has had a stroke with resultant dysphagia and decreased intake with course also complicated by right renal infarction.  Per charting, foley was placed for urinary retention - He is not a candidate for dialysis - This has been understandably difficult for his family.  I recommended a transition to a more comfort-based approach from a strictly renal standpoint and they are not quite ready to stop medical interventions at this time; "don't want to give up on him"  - We discussed that I am concerned his numbers will continue to worsen - Start half-normal saline at 75 ml/hr x 24 hours and will reassess  # Acute stroke - Per primary team and neurology  # Acute liver failure - may be ischemic  - Per primary team  # Right renal infarction - Not a candidate for aggressive intervention  # Dysphagia - Complication of recent stroke - goals of care per primary team and palliative care   # Aspiration pneumonia - Antibiotics per primary team  # Neck fracture - C6 vertebral body fracture - In a brace per primary team  Thank you for the consult.  Please do not hesitate to contact me with any questions regarding our patient   Estanislado Emms, MD 06/04/2023 8:01 PM

## 2023-06-04 NOTE — Progress Notes (Signed)
Physical Therapy Treatment Patient Details Name: Eric [name] MRN: [id number] DOB: 21-Jul-1941 Today's Date: 06/04/2023   History of Present Illness 82 y.o. male presents to [facility] hospital on 05/23/2023 as a transfer from [facility] after a fall. CT head suggestive of acute to subacute L parietal infarct, as well as IPH in R frontal lobe. 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 Pt also found to have C6 vertebral body fx. PMH includes CVA (02/2023), HTN.    PT Comments  Pt in bed upon arrival and agreeable to PT session. Pt with decreased alertness in today's session with pt responding to tasks and questions with occasional grunts. Pt only able to actively close R fist upon command. PT performed passive ROM to UE and LE. Pt winces in pain with full PROM. Educated family member on pt's current mobility status and how to perform PROM exercises. PT decreasing frequency to 1x per week to assess pt status and determine PT progression for hospital stay. Acute PT to follow.      If plan is discharge home, recommend the following: A lot of help with walking and/or transfers;A lot of help with bathing/dressing/bathroom;Direct supervision/assist for medications management;Supervision due to cognitive status;Assist for transportation;Assistance with cooking/housework;Assistance with feeding;Help with stairs or ramp for entrance   Can travel by private vehicle     No  Equipment Recommendations  None recommended by PT       Precautions / Restrictions Precautions Precautions: Fall Required Braces or Orthoses: Other Brace Other Brace: soft cervical collar for comfort Restrictions Weight Bearing Restrictions: No     Mobility  Bed Mobility  General bed mobility comments: nt, pt not able to follow commands or tasks. Pain with attemps at bed mobility        Balance Overall balance assessment: Mild deficits observed, not formally tested (pt unable to sit EOB  at this time)       Cognition Arousal: Lethargic Behavior During Therapy: Flat affect Overall Cognitive Status: Impaired/Different from baseline Area of Impairment: Following commands, Safety/judgement, Problem solving      Orientation Level: Disoriented to, Place, Situation, Time     Following Commands: Follows one step commands inconsistently Safety/Judgement: Decreased awareness of safety, Decreased awareness of deficits   Problem Solving: Slow processing, Requires verbal cues, Requires tactile cues General Comments: Pt unable to open eyes, communicates through grunts and winces in pain to full PROM. Only able to follow command to squeeze R hand        Exercises General Exercises - Upper Extremity Shoulder Flexion: PROM, Supine, Both, 5 reps Elbow Flexion: PROM, Both, 5 reps, Supine Wrist Flexion: PROM, 5 reps, Both, Supine Digit Composite Flexion: PROM, Both, 5 reps, Supine General Exercises - Lower Extremity Ankle Circles/Pumps: PROM, Both, 5 reps, Supine Heel Slides: PROM, Both, 5 reps, Supine    General Comments General comments (skin integrity, edema, etc.): VSS on supplemental O2      Pertinent Vitals/Pain Pain Assessment Pain Assessment: Faces Faces Pain Scale: Hurts a little bit Pain Location: LE and UE with full PROM Pain Intervention(s): Limited activity within patient's tolerance, Monitored during session, Repositioned     PT Goals (current goals can now be found in the care plan section) Progress towards PT goals: Not progressing toward goals - comment (decrease in mobility and ability to follow commands)    Frequency    Min 1X/week       AM-PAC PT "6 Clicks" Mobility   Outcome Measure  Help needed turning from your back to your side while in a flat bed without using bedrails?: Total Help needed moving from lying on your back to sitting on the side of a flat bed without using bedrails?: Total Help needed moving to and from a bed to a chair  (including a wheelchair)?: Total Help needed standing up from a chair using your arms (e.g., wheelchair or bedside chair)?: Total Help needed to walk in hospital room?: Total Help needed climbing 3-5 steps with a railing? : Total 6 Click Score: 6    End of Session Equipment Utilized During Treatment: Oxygen;Cervical collar Activity Tolerance: Patient limited by pain;Patient limited by fatigue Patient left: in bed;with family/visitor present;with call bell/phone within reach;with bed alarm set Nurse Communication: Mobility status PT Visit Diagnosis: Other abnormalities of gait and mobility (R26.89);Muscle weakness (generalized) (M62.81);History of falling (Z91.81)     Time: 5621-3086 PT Time Calculation (min) (ACUTE ONLY): 14 min  Charges:    $Therapeutic Exercise: 8-22 mins PT General Charges $$ ACUTE PT VISIT: 1 Visit                     Hilton Cork, PT, DPT Secure Chat Preferred  Rehab Office [phone]   Arturo Morton Brion Aliment 06/04/2023, 9:59 AM

## 2023-06-04 NOTE — Progress Notes (Signed)
PROGRESS NOTE        PATIENT DETAILS Name: ODEN LINDAMAN Age: 82 y.o. Sex: male Date of Birth: 10/12/1940 Admit Date: 05/22/2023 Admitting Physician Angie Fava, DO ZOX:[id number], Georgina Quint, MD  Brief Summary:  Patient is a 82 y.o.  male with history of CVA, HTN-who sustained a mechanical fall at home-upon further evaluation-was found to have acute CVA and C6 vertebral body fracture.  Patient was initially evaluated at [facility] and transferred to [facility] service at [facility] for further evaluation  Significant events: 9/19>> admit to [facility]  Significant studies: 6/23>> A1c: 5.8 9/19>> CT head: 0.9 cm focus of intraparenchymal hemorrhage right frontal lobe.  Acute to subacute cortical infarct left parietal lobe. 9/19 >>CT C-spine: Acute nondisplaced fracture through the anterior inferior corner of C6 vertebral body. 9/20>> CT head: Stable 1 cm anterior right middle frontal gyrus hemorrhagic contusion versus trace subarachnoid blood. 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 9/20>> MRI brain: Acute infarct anterior right frontal lobe. 9/20>> echo: EF 55-60% 9/20>> LDL: 112 05/25/2023.  Foley catheter placed by urology 05/26/23 >> CT - 1. 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 Assessment by vascular surgery or interventional radiology is recommended. 2. Extensive wedge-shaped areas of poor enhancement of the right kidney, compatible with right renal infarction. 3. Patchy airspace consolidation within the dependent portion of the left lower lobe, concerning for pneumonia. Small focus of ground-glass opacity within the left upper lobe, which may represent an additional focus of infection. 4.  Thick-walled urinary bladder which is partially decompressed by Foley catheter. Correlate with urinalysis. 5. Patchy increased sclerosis within the T12 vertebral body, nonspecific. Consider further evaluation with nonemergent MRI of the thoracic spine. Aortic Atherosclerosis (ICD10-I70.0) and Emphysema (ICD10-J43.9).  05/27/23 - Ct head - stable, reviewed with neurologist Dr. Pearlean Brownie.   Significant microbiology data: None  Procedures: None  Consults: Neurology, urology, Pall care, GI  Subjective:  Patient with fever overnight 100.4, he appears to be more obtundent this morning, cannot provide any complaints    Objective: Vitals: Blood pressure 123/73, pulse 97, temperature 98.9 F (37.2 C), temperature source Oral, resp. rate 20, height 5\' 8"  (1.727 m), weight 57.6 kg, SpO2 94%.   Exam:  Frail, elderly male, somnolent, does not open eyes or follow any commands, only answers to his name. Wearing soft c-collar Symmetrical Chest wall movement, he is tachypneic, diminished air entry at the bases with scattered Rales RRR,No Gallops,Rubs or new Murmurs, No Parasternal Heave +ve B.Sounds, Abd Soft, No tenderness, No rebound - guarding or rigidity. No Cyanosis, Clubbing or edema, No new Rash or bruise      Assessment/Plan:  Acute right frontal lobe CVA some questionable hemorrhagic contusion on CT not confirmed on MRI.   - Neurology on board, now on Eliquis and statin, aspirin stopped, stable LDL, echo and recent A1c, had mild left-sided weakness and dysarthria.  Full stroke workup done, defer further recommendations to the neurology team.   C6 vertebral body fracture   -CT and MRI although limited reviewed, seen by neurosurgery, hard c-collar removed by neurosurgery on 05/24/2022, PT OT, has subjectives comfort hence placed a soft c-collar on  PROGRESS NOTE        PATIENT DETAILS Name: ODEN LINDAMAN Age: 82 y.o. Sex: male Date of Birth: 10/12/1940 Admit Date: 05/22/2023 Admitting Physician Angie Fava, DO ZOX:[id number], Georgina Quint, MD  Brief Summary:  Patient is a 82 y.o.  male with history of CVA, HTN-who sustained a mechanical fall at home-upon further evaluation-was found to have acute CVA and C6 vertebral body fracture.  Patient was initially evaluated at [facility] and transferred to [facility] service at [facility] for further evaluation  Significant events: 9/19>> admit to [facility]  Significant studies: 6/23>> A1c: 5.8 9/19>> CT head: 0.9 cm focus of intraparenchymal hemorrhage right frontal lobe.  Acute to subacute cortical infarct left parietal lobe. 9/19 >>CT C-spine: Acute nondisplaced fracture through the anterior inferior corner of C6 vertebral body. 9/20>> CT head: Stable 1 cm anterior right middle frontal gyrus hemorrhagic contusion versus trace subarachnoid blood. 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 9/20>> MRI brain: Acute infarct anterior right frontal lobe. 9/20>> echo: EF 55-60% 9/20>> LDL: 112 05/25/2023.  Foley catheter placed by urology 05/26/23 >> CT - 1. 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 Assessment by vascular surgery or interventional radiology is recommended. 2. Extensive wedge-shaped areas of poor enhancement of the right kidney, compatible with right renal infarction. 3. Patchy airspace consolidation within the dependent portion of the left lower lobe, concerning for pneumonia. Small focus of ground-glass opacity within the left upper lobe, which may represent an additional focus of infection. 4.  Thick-walled urinary bladder which is partially decompressed by Foley catheter. Correlate with urinalysis. 5. Patchy increased sclerosis within the T12 vertebral body, nonspecific. Consider further evaluation with nonemergent MRI of the thoracic spine. Aortic Atherosclerosis (ICD10-I70.0) and Emphysema (ICD10-J43.9).  05/27/23 - Ct head - stable, reviewed with neurologist Dr. Pearlean Brownie.   Significant microbiology data: None  Procedures: None  Consults: Neurology, urology, Pall care, GI  Subjective:  Patient with fever overnight 100.4, he appears to be more obtundent this morning, cannot provide any complaints    Objective: Vitals: Blood pressure 123/73, pulse 97, temperature 98.9 F (37.2 C), temperature source Oral, resp. rate 20, height 5\' 8"  (1.727 m), weight 57.6 kg, SpO2 94%.   Exam:  Frail, elderly male, somnolent, does not open eyes or follow any commands, only answers to his name. Wearing soft c-collar Symmetrical Chest wall movement, he is tachypneic, diminished air entry at the bases with scattered Rales RRR,No Gallops,Rubs or new Murmurs, No Parasternal Heave +ve B.Sounds, Abd Soft, No tenderness, No rebound - guarding or rigidity. No Cyanosis, Clubbing or edema, No new Rash or bruise      Assessment/Plan:  Acute right frontal lobe CVA some questionable hemorrhagic contusion on CT not confirmed on MRI.   - Neurology on board, now on Eliquis and statin, aspirin stopped, stable LDL, echo and recent A1c, had mild left-sided weakness and dysarthria.  Full stroke workup done, defer further recommendations to the neurology team.   C6 vertebral body fracture   -CT and MRI although limited reviewed, seen by neurosurgery, hard c-collar removed by neurosurgery on 05/24/2022, PT OT, has subjectives comfort hence placed a soft c-collar on  25.0*  --   --  24.8*  --   --  24.9*  --  31.8* 36.2*  PROCALCITON 0.52  --  0.41 0.63  --   --  0.64  --  3.19 6.25  INR  --  1.5*  --   --   --   --   --  1.9* 2.0*  --   AMMONIA  --   --   --   --   --   --   --  17  --   --   BNP  --   --   --   --  37.4 40.8 30.5  --  50.2 56.5  MG 2.0  --   --  2.2  --   --  2.1  --  2.2 2.4  CALCIUM 9.1  --  8.7* 8.6*  --   --  8.7*  --  8.8* 8.5*    RADIOLOGY STUDIES/RESULTS: DG Chest Port 1 View  Result Date:  06/04/2023 CLINICAL DATA:  Evaluate for aspiration pneumonia, shortness of breath EXAM: PORTABLE CHEST 1 VIEW COMPARISON:  Chest radiograph 1 day prior FINDINGS: The cardiomediastinal silhouette is stable Extensive airspace opacities in both lung bases and left midlung have worsened in the interim. The upper lungs are clear. There is no pulmonary edema. There is no pleural effusion or pneumothorax There is no acute osseous abnormality. IMPRESSION: Extensive airspace opacities in both lung bases and left midlung have worsened in the interim. Electronically Signed   By: Lesia Hausen M.D.   On: 06/04/2023 09:25   DG Swallowing Func-Speech Pathology  Result Date: 06/03/2023 Table formatting from the original result was not included. Modified Barium Swallow Study Patient Details Name: MICKLE CAMPTON MRN: [id number] Date of Birth: [birth year] Today's Date: 06/03/2023 HPI/PMH: HPI: 82 y.o. male presents to [facility] hospital on 05/23/2023 as a transfer from [facility] after a fall. CT head suggestive of acute to subacute L parietal infarct, as well as IPH in R frontal lobe. Pt also found to have C6 vertebral body fx. PMH includes CVA, HTN. Pt is mild-moderately dysarthric Clinical Impression: Clinical Impression: Very limited study, pt unable to be appropriately positioned for visiblity. Pt unwilling to accept more than two sips of barium. Regardless, able to determine that pt has severe pharyngeal dysphagia with no hyoid excursion or PES opening with no entrance of barium into the cervical esophagus. More than 50% of bolus was aspirated with sensation but no ejection. Pt is severely weak and unable to protect airway or compensate in any way. There is no expectation that swallowing function will improve given inability to participate in activity and no nutrition. Counseled daughter about severity of dysphagia and poor prognosis for improvement with review of images. Assured daughter that pt is trying his best, but is unable  to eat and drink. Hopeful that study will give family more information to guide decision making. Factors that may increase risk of adverse event in presence of aspiration Rubye Oaks & Clearance Coots 2021): No data recorded Recommendations/Plan: Swallowing Evaluation Recommendations Swallowing Evaluation Recommendations Recommendations: -- (comfort feeding) Treatment Plan Treatment Plan Treatment recommendations: No treatment recommended at this time Recommendations Recommendations for follow up therapy are one component of a multi-disciplinary discharge planning process, led by the attending physician.  Recommendations may be updated based on patient status, additional functional criteria and insurance authorization. Assessment: Orofacial Exam: Orofacial Exam Oral Cavity - Dentition: Edentulous Anatomy: No data recorded Boluses Administered: Boluses Administered Boluses Administered:  PROGRESS NOTE        PATIENT DETAILS Name: ODEN LINDAMAN Age: 82 y.o. Sex: male Date of Birth: 10/12/1940 Admit Date: 05/22/2023 Admitting Physician Angie Fava, DO ZOX:[id number], Georgina Quint, MD  Brief Summary:  Patient is a 82 y.o.  male with history of CVA, HTN-who sustained a mechanical fall at home-upon further evaluation-was found to have acute CVA and C6 vertebral body fracture.  Patient was initially evaluated at [facility] and transferred to [facility] service at [facility] for further evaluation  Significant events: 9/19>> admit to [facility]  Significant studies: 6/23>> A1c: 5.8 9/19>> CT head: 0.9 cm focus of intraparenchymal hemorrhage right frontal lobe.  Acute to subacute cortical infarct left parietal lobe. 9/19 >>CT C-spine: Acute nondisplaced fracture through the anterior inferior corner of C6 vertebral body. 9/20>> CT head: Stable 1 cm anterior right middle frontal gyrus hemorrhagic contusion versus trace subarachnoid blood. 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 9/20>> MRI brain: Acute infarct anterior right frontal lobe. 9/20>> echo: EF 55-60% 9/20>> LDL: 112 05/25/2023.  Foley catheter placed by urology 05/26/23 >> CT - 1. 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 Assessment by vascular surgery or interventional radiology is recommended. 2. Extensive wedge-shaped areas of poor enhancement of the right kidney, compatible with right renal infarction. 3. Patchy airspace consolidation within the dependent portion of the left lower lobe, concerning for pneumonia. Small focus of ground-glass opacity within the left upper lobe, which may represent an additional focus of infection. 4.  Thick-walled urinary bladder which is partially decompressed by Foley catheter. Correlate with urinalysis. 5. Patchy increased sclerosis within the T12 vertebral body, nonspecific. Consider further evaluation with nonemergent MRI of the thoracic spine. Aortic Atherosclerosis (ICD10-I70.0) and Emphysema (ICD10-J43.9).  05/27/23 - Ct head - stable, reviewed with neurologist Dr. Pearlean Brownie.   Significant microbiology data: None  Procedures: None  Consults: Neurology, urology, Pall care, GI  Subjective:  Patient with fever overnight 100.4, he appears to be more obtundent this morning, cannot provide any complaints    Objective: Vitals: Blood pressure 123/73, pulse 97, temperature 98.9 F (37.2 C), temperature source Oral, resp. rate 20, height 5\' 8"  (1.727 m), weight 57.6 kg, SpO2 94%.   Exam:  Frail, elderly male, somnolent, does not open eyes or follow any commands, only answers to his name. Wearing soft c-collar Symmetrical Chest wall movement, he is tachypneic, diminished air entry at the bases with scattered Rales RRR,No Gallops,Rubs or new Murmurs, No Parasternal Heave +ve B.Sounds, Abd Soft, No tenderness, No rebound - guarding or rigidity. No Cyanosis, Clubbing or edema, No new Rash or bruise      Assessment/Plan:  Acute right frontal lobe CVA some questionable hemorrhagic contusion on CT not confirmed on MRI.   - Neurology on board, now on Eliquis and statin, aspirin stopped, stable LDL, echo and recent A1c, had mild left-sided weakness and dysarthria.  Full stroke workup done, defer further recommendations to the neurology team.   C6 vertebral body fracture   -CT and MRI although limited reviewed, seen by neurosurgery, hard c-collar removed by neurosurgery on 05/24/2022, PT OT, has subjectives comfort hence placed a soft c-collar on  PROGRESS NOTE        PATIENT DETAILS Name: ODEN LINDAMAN Age: 82 y.o. Sex: male Date of Birth: 10/12/1940 Admit Date: 05/22/2023 Admitting Physician Angie Fava, DO ZOX:[id number], Georgina Quint, MD  Brief Summary:  Patient is a 82 y.o.  male with history of CVA, HTN-who sustained a mechanical fall at home-upon further evaluation-was found to have acute CVA and C6 vertebral body fracture.  Patient was initially evaluated at [facility] and transferred to [facility] at [facility] for further evaluation  Significant events: 9/19>> admit to [facility]  Significant studies: 6/23>> A1c: 5.8 9/19>> CT head: 0.9 cm focus of intraparenchymal hemorrhage right frontal lobe.  Acute to subacute cortical infarct left parietal lobe. 9/19 >>CT C-spine: Acute nondisplaced fracture through the anterior inferior corner of C6 vertebral body. 9/20>> CT head: Stable 1 cm anterior right middle frontal gyrus hemorrhagic contusion versus trace subarachnoid blood. 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 9/20>> MRI brain: Acute infarct anterior right frontal lobe. 9/20>> echo: EF 55-60% 9/20>> LDL: 112 05/25/2023.  Foley catheter placed by urology 05/26/23 >> CT - 1. 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 Assessment by vascular surgery or interventional radiology is recommended. 2. Extensive wedge-shaped areas of poor enhancement of the right kidney, compatible with right renal infarction. 3. Patchy airspace consolidation within the dependent portion of the left lower lobe, concerning for pneumonia. Small focus of ground-glass opacity within the left upper lobe, which may represent an additional focus of infection. 4.  Thick-walled urinary bladder which is partially decompressed by Foley catheter. Correlate with urinalysis. 5. Patchy increased sclerosis within the T12 vertebral body, nonspecific. Consider further evaluation with nonemergent MRI of the thoracic spine. Aortic Atherosclerosis (ICD10-I70.0) and Emphysema (ICD10-J43.9).  05/27/23 - Ct head - stable, reviewed with neurologist Dr. Pearlean Brownie.   Significant microbiology data: None  Procedures: None  Consults: Neurology, urology, Pall care, GI  Subjective:  Patient with fever overnight 100.4, he appears to be more obtundent this morning, cannot provide any complaints    Objective: Vitals: Blood pressure 123/73, pulse 97, temperature 98.9 F (37.2 C), temperature source Oral, resp. rate 20, height 5\' 8"  (1.727 m), weight 57.6 kg, SpO2 94%.   Exam:  Frail, elderly male, somnolent, does not open eyes or follow any commands, only answers to his name. Wearing soft c-collar Symmetrical Chest wall movement, he is tachypneic, diminished air entry at the bases with scattered Rales RRR,No Gallops,Rubs or new Murmurs, No Parasternal Heave +ve B.Sounds, Abd Soft, No tenderness, No rebound - guarding or rigidity. No Cyanosis, Clubbing or edema, No new Rash or bruise      Assessment/Plan:  Acute right frontal lobe CVA some questionable hemorrhagic contusion on CT not confirmed on MRI.   - Neurology on board, now on Eliquis and statin, aspirin stopped, stable LDL, echo and recent A1c, had mild left-sided weakness and dysarthria.  Full stroke workup done, defer further recommendations to the neurology team.   C6 vertebral body fracture   -CT and MRI although limited reviewed, seen by neurosurgery, hard c-collar removed by neurosurgery on 05/24/2022, PT OT, has subjectives comfort hence placed a soft c-collar on  25.0*  --   --  24.8*  --   --  24.9*  --  31.8* 36.2*  PROCALCITON 0.52  --  0.41 0.63  --   --  0.64  --  3.19 6.25  INR  --  1.5*  --   --   --   --   --  1.9* 2.0*  --   AMMONIA  --   --   --   --   --   --   --  17  --   --   BNP  --   --   --   --  37.4 40.8 30.5  --  50.2 56.5  MG 2.0  --   --  2.2  --   --  2.1  --  2.2 2.4  CALCIUM 9.1  --  8.7* 8.6*  --   --  8.7*  --  8.8* 8.5*    RADIOLOGY STUDIES/RESULTS: DG Chest Port 1 View  Result Date:  06/04/2023 CLINICAL DATA:  Evaluate for aspiration pneumonia, shortness of breath EXAM: PORTABLE CHEST 1 VIEW COMPARISON:  Chest radiograph 1 day prior FINDINGS: The cardiomediastinal silhouette is stable Extensive airspace opacities in both lung bases and left midlung have worsened in the interim. The upper lungs are clear. There is no pulmonary edema. There is no pleural effusion or pneumothorax There is no acute osseous abnormality. IMPRESSION: Extensive airspace opacities in both lung bases and left midlung have worsened in the interim. Electronically Signed   By: Lesia Hausen M.D.   On: 06/04/2023 09:25   DG Swallowing Func-Speech Pathology  Result Date: 06/03/2023 Table formatting from the original result was not included. Modified Barium Swallow Study Patient Details Name: MICKLE CAMPTON MRN: [id number] Date of Birth: [birth year] Today's Date: 06/03/2023 HPI/PMH: HPI: 82 y.o. male presents to [facility] hospital on 05/23/2023 as a transfer from [facility] ED after a fall. CT head suggestive of acute to subacute L parietal infarct, as well as IPH in R frontal lobe. Pt also found to have C6 vertebral body fx. PMH includes CVA, HTN. Pt is mild-moderately dysarthric Clinical Impression: Clinical Impression: Very limited study, pt unable to be appropriately positioned for visiblity. Pt unwilling to accept more than two sips of barium. Regardless, able to determine that pt has severe pharyngeal dysphagia with no hyoid excursion or PES opening with no entrance of barium into the cervical esophagus. More than 50% of bolus was aspirated with sensation but no ejection. Pt is severely weak and unable to protect airway or compensate in any way. There is no expectation that swallowing function will improve given inability to participate in activity and no nutrition. Counseled daughter about severity of dysphagia and poor prognosis for improvement with review of images. Assured daughter that pt is trying his best, but is unable  to eat and drink. Hopeful that study will give family more information to guide decision making. Factors that may increase risk of adverse event in presence of aspiration Rubye Oaks & Clearance Coots 2021): No data recorded Recommendations/Plan: Swallowing Evaluation Recommendations Swallowing Evaluation Recommendations Recommendations: -- (comfort feeding) Treatment Plan Treatment Plan Treatment recommendations: No treatment recommended at this time Recommendations Recommendations for follow up therapy are one component of a multi-disciplinary discharge planning process, led by the attending physician.  Recommendations may be updated based on patient status, additional functional criteria and insurance authorization. Assessment: Orofacial Exam: Orofacial Exam Oral Cavity - Dentition: Edentulous Anatomy: No data recorded Boluses Administered: Boluses Administered Boluses Administered:

## 2023-06-04 NOTE — TOC Progression Note (Signed)
Transition of Care [facility]) - Progression Note    Patient Details  Name: Eric [name] MRN: [id number] Date of Birth: 11-28-1940  Transition of Care [facility] CM/SW Contact  Mearl Latin, LCSW Phone Number: 06/04/2023, 9:39 AM  Clinical Narrative:    CSW following for needs.    Expected Discharge Plan: Skilled Nursing Facility Barriers to Discharge: Continued Medical Work up, English as a second language teacher, SNF Pending bed offer  Expected Discharge Plan and Services In-house Referral: Clinical Social Work     Living arrangements for the past 2 months: Single Family Home                                       Social Determinants of Health (SDOH) Interventions SDOH Screenings   Food Insecurity: No Food Insecurity (05/30/2023)  Housing: Low Risk  (05/30/2023)  Transportation Needs: No Transportation Needs (05/30/2023)  Utilities: Not At Risk (05/30/2023)  Depression (PHQ2-9): Low Risk  (04/17/2023)  Tobacco Use: Medium Risk (05/22/2023)    Readmission Risk Interventions     No data to display

## 2023-06-04 NOTE — Plan of Care (Signed)
  Problem: Self-Care: Goal: Ability to participate in self-care as condition permits will improve Outcome: Not Progressing   Problem: Nutrition: Goal: Risk of aspiration will decrease Outcome: Not Progressing Goal: Dietary intake will improve Outcome: Not Progressing   Problem: Education: Goal: Knowledge of disease or condition will improve Outcome: Progressing Goal: Knowledge of patient specific risk factors will improve Loraine Leriche N/A or DELETE if not current risk factor) Outcome: Progressing   Problem: Ischemic Stroke/TIA Tissue Perfusion: Goal: Complications of ischemic stroke/TIA will be minimized Outcome: Progressing   Problem: Coping: Goal: Will identify appropriate support needs Outcome: Progressing   Problem: Self-Care: Goal: Ability to communicate needs accurately will improve Outcome: Progressing

## 2023-06-04 NOTE — Progress Notes (Signed)
TRH night cross cover note:   I was notified by RN that this patient is requesting a pain medication for neck and back discomfort.  He is currently n.p.o. in the setting of aspiration risk.  I subsequently placed order for prn IV fentanyl to address the above.     Newton Pigg, DO Hospitalist

## 2023-06-05 DIAGNOSIS — I639 Cerebral infarction, unspecified: Secondary | ICD-10-CM | POA: Diagnosis not present

## 2023-06-05 DIAGNOSIS — Z515 Encounter for palliative care: Secondary | ICD-10-CM | POA: Diagnosis not present

## 2023-06-05 DIAGNOSIS — S12501D Unspecified nondisplaced fracture of sixth cervical vertebra, subsequent encounter for fracture with routine healing: Secondary | ICD-10-CM | POA: Diagnosis not present

## 2023-06-05 DIAGNOSIS — Z7189 Other specified counseling: Secondary | ICD-10-CM | POA: Diagnosis not present

## 2023-06-05 DIAGNOSIS — G9340 Encephalopathy, unspecified: Secondary | ICD-10-CM | POA: Diagnosis not present

## 2023-06-05 LAB — COMPREHENSIVE METABOLIC PANEL
ALT: 360 U/L — ABNORMAL HIGH (ref 0–44)
AST: 431 U/L — ABNORMAL HIGH (ref 15–41)
Albumin: <1.5 g/dL — ABNORMAL LOW (ref 3.5–5.0)
Alkaline Phosphatase: 164 U/L — ABNORMAL HIGH (ref 38–126)
Anion gap: 16 — ABNORMAL HIGH (ref 5–15)
BUN: 73 mg/dL — ABNORMAL HIGH (ref 8–23)
CO2: 20 mmol/L — ABNORMAL LOW (ref 22–32)
Calcium: 8.5 mg/dL — ABNORMAL LOW (ref 8.9–10.3)
Chloride: 107 mmol/L (ref 98–111)
Creatinine, Ser: 6.54 mg/dL — ABNORMAL HIGH (ref 0.61–1.24)
GFR, Estimated: 8 mL/min — ABNORMAL LOW (ref >60)
Glucose, Bld: 91 mg/dL (ref 70–99)
Potassium: 4.8 mmol/L (ref 3.5–5.1)
Sodium: 143 mmol/L (ref 135–145)
Total Bilirubin: 1.2 mg/dL (ref 0.3–1.2)
Total Protein: 5.9 g/dL — ABNORMAL LOW (ref 6.5–8.1)

## 2023-06-05 LAB — CBC WITH DIFFERENTIAL/PLATELET
Abs Immature Granulocytes: 0.4 10*3/uL — ABNORMAL HIGH (ref 0.00–0.07)
Basophils Absolute: 0 10*3/uL (ref 0.0–0.1)
Basophils Relative: 0 %
Eosinophils Absolute: 0 10*3/uL (ref 0.0–0.5)
Eosinophils Relative: 0 %
HCT: 32 % — ABNORMAL LOW (ref 39.0–52.0)
Hemoglobin: 11.2 g/dL — ABNORMAL LOW (ref 13.0–17.0)
Lymphocytes Relative: 3 %
Lymphs Abs: 1.2 10*3/uL (ref 0.7–4.0)
MCH: 27.5 pg (ref 26.0–34.0)
MCHC: 35 g/dL (ref 30.0–36.0)
MCV: 78.4 fL — ABNORMAL LOW (ref 80.0–100.0)
Metamyelocytes Relative: 1 %
Monocytes Absolute: 1.6 10*3/uL — ABNORMAL HIGH (ref 0.1–1.0)
Monocytes Relative: 4 %
Neutro Abs: 37.3 10*3/uL — ABNORMAL HIGH (ref 1.7–7.7)
Neutrophils Relative %: 92 %
Platelets: 432 10*3/uL — ABNORMAL HIGH (ref 150–400)
RBC: 4.08 MIL/uL — ABNORMAL LOW (ref 4.22–5.81)
RDW: 14.8 % (ref 11.5–15.5)
WBC: 40.5 10*3/uL — ABNORMAL HIGH (ref 4.0–10.5)
nRBC: 0 % (ref 0.0–0.2)
nRBC: 0 /100{WBCs}

## 2023-06-05 LAB — PROCALCITONIN: Procalcitonin: 7.21 ng/mL

## 2023-06-05 LAB — VARICELLA-ZOSTER BY PCR: Varicella-Zoster, PCR: NEGATIVE

## 2023-06-05 MED ORDER — HALOPERIDOL LACTATE 5 MG/ML IJ SOLN: 5.0000 mg | Freq: Four times a day (QID) | INTRAMUSCULAR | Status: DC | PRN

## 2023-06-05 MED ORDER — HALOPERIDOL LACTATE 5 MG/ML IJ SOLN
5.0000 mg | Freq: Four times a day (QID) | INTRAMUSCULAR | Status: AC | PRN
  Administered 2023-06-06: 5 mg via INTRAVENOUS
  Filled 2023-06-05: qty 1

## 2023-06-05 MED ORDER — SODIUM CHLORIDE 0.9 % IV SOLN
500.0000 mg | INTRAVENOUS | Status: AC
  Administered 2023-06-06 – 2023-06-07 (×2): 500 mg via INTRAVENOUS
  Filled 2023-06-05 (×2): qty 10

## 2023-06-05 NOTE — Progress Notes (Signed)
Daily Progress Note   Patient Name: Eric [name]       Date: 06/05/2023 DOB: [birth year]  Age: 82 y.o. MRN#: [id number] Attending Physician: Starleen Arms, MD Primary Care Physician: Tresa Garter, MD Admit Date: 05/22/2023  Reason for Consultation/Follow-up: Establishing goals of care  Length of Stay: 14  Current Medications: Scheduled Meds:   apixaban  5 mg Oral BID   carvedilol  6.25 mg Oral BID WC   Chlorhexidine Gluconate Cloth  6 each Topical Daily   finasteride  5 mg Oral Daily   mirtazapine  15 mg Oral QHS   rosuvastatin  10 mg Oral Daily   tamsulosin  0.4 mg Oral Daily    Continuous Infusions:  sodium chloride 75 mL/hr at 06/05/23 0656   meropenem (MERREM) IV 500 mg (06/05/23 1003)    PRN Meds: acetaminophen **OR** acetaminophen, fentaNYL (SUBLIMAZE) injection, hydrALAZINE, labetalol, melatonin, naLOXone (NARCAN)  injection, ondansetron (ZOFRAN) IV, polyethylene glycol  Physical Exam Vitals reviewed.  Constitutional:      General: He is sleeping.     Appearance: He is cachectic. He is ill-appearing.     Interventions: Nasal cannula in place.  Cardiovascular:     Rate and Rhythm: Normal rate.  Pulmonary:     Effort: Tachypnea present.             Vital Signs: BP 122/70 (BP Location: Right Arm)   Pulse 93   Temp 100.2 F (37.9 C) (Axillary)   Resp (!) 24   Ht 5\' 8"  (1.727 m)   Wt 57.6 kg   SpO2 96%   BMI 19.31 kg/m  SpO2: SpO2: 96 % O2 Device: O2 Device: Nasal Cannula O2 Flow Rate: O2 Flow Rate (L/min): 2 L/min        Palliative Assessment/Data: 10% NPO      Patient Active Problem List   Diagnosis Date Noted   Elevated liver enzymes 06/02/2023   Drug-induced liver injury 06/02/2023   Acute encephalopathy 05/23/2023   C6  cervical fracture (HCC) 05/23/2023   Elevated troponin 05/23/2023   BPH (benign prostatic hyperplasia) 05/23/2023   Acute ischemic stroke (HCC) 05/22/2023   Confusion 03/13/2023   Abnormal CXR 02/23/2023   Subarachnoid bleed (HCC) 02/22/2023   CVA (cerebral vascular accident) (HCC) 02/22/2023   Anemia 01/02/2023   Restless legs syndrome (  RLS) 04/24/2021   Abnormal ultrasound of bladder 12/25/2020   UTI (urinary tract infection) 09/25/2020   Cervical pain (neck) 05/15/2018   Abdominal bloating 05/15/2018   Arm pain, diffuse, left 12/31/2017   Well adult exam 07/30/2017   Vitamin D deficiency 12/18/2016   Osteoarthritis 12/18/2016   B12 deficiency 09/16/2016   Hypokalemia 08/16/2016   Dizziness and giddiness 11/15/2015   Blurred vision 11/15/2015   Headache 07/07/2013   Hyperlipemia 07/07/2013   Acute intracranial hemorrhage (HCC) 07/03/2013   Pain of both eyes 07/02/2013   Acute bronchitis 07/02/2013   ERECTILE DYSFUNCTION, 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 male with past medical history of stroke, essential pretension, who was admitted to [facility] on 05/22/2023 with suspected acute versus subacute ischemic stroke after presenting from home to [facility] ED for evaluation of fall.   Today's Discussion: Patient was sleeping in bed in NAD. Son at bedside. He tells me he has just assisted with suctioning and getting his father comfortable. We discuss his multiple comorbidities and continued risk for aspiration. He understands that his dad is very sick. The family would like to have another family meeting but have not figured out a time for this. He is hopeful everybody will be able to meet over the weekend. He has two brother and two sisters and they have somebody stay with the patient at all times. Encourage him to call PMT  when they have a meeting time. Encourage him to call PMT with questions or concerns.  Recommendations/Plan: Continue full code/ full scope Encouraged continued discussion around goals of care-- [name] is hopeful the family will be able to meet this weekend. He will let PMT know a date/time. Continued PMT support.   Code Status:    Code Status Orders  (From admission, onward)           Start     Ordered   05/22/23 2315  Full code  Continuous       Question:  By:  Answer:  Consent: discussion documented in EHR   05/22/23 2314           Extensive chart review has been completed prior to seeing the patient and speaking with his son including labs, vital signs, imagine, progress/consult notes, orders, medications, and available advance directive documents.  Care plan was discussed with Dr. Randol Kern  Time spent: 25 minutes  Thank you for allowing the Palliative Medicine Team to assist in the care of this patient.     Sherryll Burger, NP  Please contact Palliative Medicine Team phone at [phone] for questions and concerns.

## 2023-06-05 NOTE — Plan of Care (Signed)
  Problem: Elimination: Goal: Will not experience complications related to bowel motility 06/05/2023 0832 by Horris Latino, RN Outcome: Progressing 06/05/2023 0827 by Horris Latino, RN Outcome: Progressing Goal: Will not experience complications related to urinary retention 06/05/2023 0832 by Horris Latino, RN Outcome: Progressing 06/05/2023 0827 by Horris Latino, RN Outcome: Progressing   Problem: Pain Managment: Goal: General experience of comfort will improve 06/05/2023 0832 by Horris Latino, RN Outcome: Progressing 06/05/2023 0827 by Horris Latino, RN Outcome: Progressing   Problem: Safety: Goal: Ability to remain free from injury will improve 06/05/2023 0832 by Horris Latino, RN Outcome: Progressing 06/05/2023 0827 by Horris Latino, RN Outcome: Progressing   Problem: Skin Integrity: Goal: Risk for impaired skin integrity will decrease 06/05/2023 0832 by Horris Latino, RN Outcome: Progressing 06/05/2023 0827 by Horris Latino, RN Outcome: Progressing

## 2023-06-05 NOTE — Progress Notes (Signed)
[facility] night cross cover note:   I was notified by RN that the patient is exhibiting evidence of restlessness, and also appears to be in pain, noting a history of neck discomfort.  He has an existing order for prn IV fentanyl, which we will try now.  I have also placed an order for a one-time dose of prn IV Haldol for use if the patient's restlessness persists in spite of improvement in pain control with the aforementioned prn IV fentanyl.     Newton Pigg, DO Hospitalist

## 2023-06-05 NOTE — Progress Notes (Addendum)
PROGRESS NOTE        PATIENT DETAILS Name: Eric [name] Age: 82 y.o. Sex: male Date of Birth: 1940-09-21 Admit Date: 05/22/2023 Admitting Physician Angie Fava, DO [id number], Georgina Quint, MD  Brief Summary:  Patient is a 82 y.o.  male with history of CVA, HTN-who sustained a mechanical fall at home-upon further evaluation-was found to have acute CVA and C6 vertebral body fracture.  Patient was initially evaluated at [facility] and transferred to [facility] service at [facility] for further evaluation  Significant events: 9/19>> admit to [facility]  Significant studies: 6/23>> A1c: 5.8 9/19>> CT head: 0.9 cm focus of intraparenchymal hemorrhage right frontal lobe.  Acute to subacute cortical infarct left parietal lobe. 9/19 >>CT C-spine: Acute nondisplaced fracture through the anterior inferior corner of C6 vertebral body. 9/20>> CT head: Stable 1 cm anterior right middle frontal gyrus hemorrhagic contusion versus trace subarachnoid blood. 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 9/20>> MRI brain: Acute infarct anterior right frontal lobe. 9/20>> echo: EF 55-60% 9/20>> LDL: 112 05/25/2023.  Foley catheter placed by urology 05/26/23 >> CT - 1. 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 Assessment by vascular surgery or interventional radiology is recommended. 2. Extensive wedge-shaped areas of poor enhancement of the right kidney, compatible with right renal infarction. 3. Patchy airspace consolidation within the dependent portion of the left lower lobe, concerning for pneumonia. Small focus of ground-glass opacity within the left upper lobe, which may represent an additional focus of infection. 4.  Thick-walled urinary bladder which is partially decompressed by Foley catheter. Correlate with urinalysis. 5. Patchy increased sclerosis within the T12 vertebral body, nonspecific. Consider further evaluation with nonemergent MRI of the thoracic spine. Aortic Atherosclerosis (ICD10-I70.0) and Emphysema (ICD10-J43.9).  05/27/23 - Ct head - stable, reviewed with neurologist Dr. Pearlean Brownie.   Significant microbiology data: None  Procedures: None  Consults: Neurology, urology, Pall care, GI,renal  Subjective:  Tmax 101 over last 24 hours, patient had some pain where he received some fentanyl earlier today.   Objective: Vitals: Blood pressure (!) 139/97, pulse 96, temperature 98.6 F (37 C), temperature source Axillary, resp. rate 18, height 5\' 8"  (1.727 m), weight 57.6 kg, SpO2 95%.   Exam:  Frail, elderly male, somnolent, does not open eyes or follow any commands, only answers to his name. Wearing soft c-collar Symmetrical Chest wall movement, he is tachypneic, diminished air entry at the bases with scattered Rales RRR,No Gallops,Rubs or new Murmurs, No Parasternal Heave +ve B.Sounds, Abd Soft, No tenderness, No rebound - guarding or rigidity. No Cyanosis, Clubbing or edema, No new Rash or bruise       Assessment/Plan:  Acute right frontal lobe CVA some questionable hemorrhagic contusion on CT not confirmed on MRI.   - Neurology on board, now on Eliquis and statin, aspirin stopped, stable LDL, echo and recent A1c, had mild left-sided weakness and dysarthria.  Full stroke workup done, defer further recommendations to the neurology team.   C6 vertebral body fracture   -CT and MRI although limited reviewed, seen by neurosurgery, hard c-collar removed by neurosurgery on 05/24/2022, PT OT, has subjectives comfort hence placed a soft c-collar  on 05/27/2023.  Pain regimen adjusted for better pain control.  HTN -Initially allowing for permissive hypertension, blood pressure currently  acceptable, no changes to be done today  Hypokalemia.   - Replaced.  AKI -creatinine is up to 6.5 today, this is likely multifactorial, as he received IV contrast for stroke workup initially,as well renal artery thrombosis, he has a Foley catheter already, renal consult greatly appreciated, he is not HD candidate  Asymptomatic transaminitis.   -Neurology input greatly appreciated, this can be related to [facility], avoid hepatotoxins, LFTs trending down, continue to monitor.  -Avoid penicillins  Ongoing microaspiration.   Aspiration pneumonia  -Patient high risk for aspiration, did not do well with MBS study 06/03/2023  -Patient with worsening leukocytosis, continues to have fever, despite being on meropenem, repeat chest x-ray on 10/2 unfortunately showed worsening pneumonia  -Continue with meropenem for now . -He will remain NPO.   -Patient is not safe to swallow secondary to aspiration, and encephalopathy, son was asking about nutrition including TPN, I told him that TPN is not indicated here given patient has no bowel obstruction, as well he is having transaminitis, I have discussed with him about cortrak tube feed, he would like to hold on that for now  HLD  Resume Crestor-increase to 40 mg daily  BPH - Proscar but developed urinary retention, urology was called Foley catheter was placed by Dr. Alvester Morin on 05/25/2023.   Nonspecific abdominal pain and sweats morning of 05/26/2023.  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 - He is on full anticoagulation.  Patchy increased sclerosis within the T12 vertebral body, nonspecific.  - Outpatient further evaluation with nonemergent MRI of the thoracic spine.  Possible aspiration pneumonia.  Unasyn and monitor.  Speech following.  Hypokalemia.   Replace.  Encephalopathy -Multifactorial, in the setting of CVA presentation, infection, uremia with worsening renal failure.  Goals of care -With multiple comorbidities, cachectic, with failure to thrive, poor oral intake, high risk for aspiration, acute CVA, with worsening renal failure, not HD candidate, liver injury, I have discussed with son again today, about very poor prognosis, and best approach would be to focus on comfort, and unlikely patient will survive his multiorgan failures, and I would recommend to proceed with comfort, he will discuss with other family members, they are supposed to meet with palliative medicine over the weekend,  palliative medicine assisting to discuss goals of discussion.   Underweight: Estimated body mass index is 19.31 kg/m as calculated from the following:   Height as of this encounter: 5\' 8"  (1.727 m).   Weight as of this encounter: 57.6 kg.   Code status:   Code Status: Full Code   DVT Prophylaxis: SCDs Start: 05/22/23 2315 apixaban (ELIQUIS) tablet 5 mg   Family Communication:  discussed with son at bedside 10/2.     Disposition Plan: Status is: Inpatient Remains inpatient appropriate because: Severity of illness   Planned Discharge Destination:Home health   Diet: Diet Order             Diet NPO time specified  Diet effective now                   MEDICATIONS: Scheduled Meds:  apixaban  5 mg Oral BID   carvedilol  6.25 mg Oral BID WC   Chlorhexidine Gluconate Cloth  6 each Topical Daily   finasteride  5 mg Oral Daily   mirtazapine  15 mg Oral QHS   tamsulosin  0.4 mg Oral Daily   Continuous Infusions:  sodium chloride 75 mL/hr at 06/05/23 0656   [START ON 06/06/2023] meropenem (MERREM) IV       PRN Meds:.acetaminophen **OR** acetaminophen, fentaNYL (SUBLIMAZE) injection, hydrALAZINE, labetalol, melatonin, naLOXone (NARCAN)  injection, ondansetron (ZOFRAN) IV, polyethylene glycol   I have personally reviewed  following labs and imaging studies  LABORATORY DATA:  Recent Labs  Lab 06/01/23 1145 06/02/23 0319 06/03/23 0315 06/04/23 0313 06/05/23 0310  WBC 11.0* 10.4 13.1* 23.4* 40.5*  HGB 11.9* 13.2 12.6* 11.7* 11.2*  HCT 34.8* 38.0* 36.7* 33.5* 32.0*  PLT 323 363 401* 420* 432*  MCV 78.4* 80.0 77.3* 77.2* 78.4*  MCH 26.8 27.8 26.5 27.0 27.5  MCHC 34.2 34.7 34.3 34.9 35.0  RDW 15.0 14.7 14.7 14.6 14.8  LYMPHSABS 0.9 0.5* 0.6* 1.2 1.2  MONOABS 1.2* 0.7 1.0 0.9 1.6*  EOSABS 0.0 0.0 0.0 0.0 0.0  BASOSABS 0.0 0.0 0.0 0.0 0.0    Recent Labs  Lab 05/30/23 0609 05/30/23 1746 05/31/23 0721 06/01/23 1145 06/01/23 1146 06/01/23 1957 06/02/23 0319 06/02/23 1352 06/03/23 0315 06/04/23 0313 06/05/23 0310  NA 137  --    < > 139  --   --  140  --  142 144 143  K 3.1*  --    < > 3.6  --   --  3.7  --  4.0 4.5 4.8  CL 101  --    < > 102  --   --  106  --  107 109 107  CO2 21*  --    < > 23  --   --  21*  --  20* 21* 20*  ANIONGAP 15  --    < > 14  --   --  13  --  15 14 16*  GLUCOSE 144*  --    < > 123*  --   --  96  --  146* 183* 91  BUN 14  --    < > 21  --   --  23  --  36* 55* 73*  CREATININE 1.93*  --    < > 2.19*  --   --  2.47*  --  3.26* 4.88* 6.54*  AST 145*  --    < > 414*  --   --  521*  --  349* 239* 431*  ALT 145*  --    < > 434*  --   --  518*  --  513* 370* 360*  ALKPHOS 73  --    < > 102  --   --  135*  --  148* 149* 164*  BILITOT 0.8  --    < > 0.8  --   --  0.9  --  1.1 1.0 1.2  ALBUMIN 2.6*  --    < > 1.9*  --   --  2.0*  --  2.0* 1.7* <1.5*  CRP 25.0*  --   --  24.8*  --   --  24.9*  --  31.8* 36.2*  --   PROCALCITON 0.52  --    < > 0.63  --   --  0.64  --  3.19 6.25 7.21  INR  --  1.5*  --   --   --   --   --  1.9* 2.0*  --   --   AMMONIA  --   --   --   --   --   --   --  17  --   --   --   BNP  --   --   --   --  37.4 40.8 30.5  --  50.2 56.5  --   MG 2.0  --   --  2.2  --   --  2.1  --  2.2 2.4  --   CALCIUM 9.1  --    < > 8.6*  --   --  8.7*  --  8.8* 8.5*  8.5*   < > = values in this interval not displayed.    Lab Results  Component Value Date   CHOL 116 05/23/2023   HDL 65 05/23/2023   LDLCALC 39 05/23/2023   LDLDIRECT 163.3 05/26/2008   TRIG 59 05/23/2023   CHOLHDL 1.8 05/23/2023   Lab Results  Component Value Date   HGBA1C 6.3 (H) 05/26/2023      Recent Labs  Lab 05/30/23 0609 05/30/23 1746 05/31/23 0721 06/01/23 1145 06/01/23 1146 06/01/23 1957 06/02/23 0319 06/02/23 1352 06/03/23 0315 06/04/23 0313 06/05/23 0310  CRP 25.0*  --   --  24.8*  --   --  24.9*  --  31.8* 36.2*  --   PROCALCITON 0.52  --    < > 0.63  --   --  0.64  --  3.19 6.25 7.21  INR  --  1.5*  --   --   --   --   --  1.9* 2.0*  --   --   AMMONIA  --   --   --   --   --   --   --  17  --   --   --   BNP  --   --   --   --  37.4 40.8 30.5  --  50.2 56.5  --   MG 2.0  --   --  2.2  --   --  2.1  --  2.2 2.4  --   CALCIUM 9.1  --    < > 8.6*  --   --  8.7*  --  8.8* 8.5* 8.5*   < > = values in this interval not displayed.    RADIOLOGY STUDIES/RESULTS: DG Chest Port 1 View  Result Date: 06/04/2023 CLINICAL DATA:  Evaluate for aspiration pneumonia, shortness of breath EXAM: PORTABLE CHEST 1 VIEW COMPARISON:  Chest radiograph 1 day prior FINDINGS: The cardiomediastinal silhouette is stable Extensive airspace opacities in both lung bases and left midlung have worsened in the interim. The upper lungs are clear. There is no pulmonary edema. There is no pleural effusion or pneumothorax There is no acute osseous abnormality. IMPRESSION: Extensive airspace opacities in both lung bases and left midlung have worsened in the interim. Electronically Signed   By: Lesia Hausen M.D.   On: 06/04/2023 09:25     LOS: 14 days   Signature  -    Huey Bienenstock M.D on 06/05/2023 at 4:09 PM   -  To page go to [email or web address]

## 2023-06-05 NOTE — Plan of Care (Signed)
  Problem: Education: Goal: Knowledge of secondary prevention will improve (MUST DOCUMENT ALL) Outcome: Progressing Goal: Knowledge of patient specific risk factors will improve Loraine Leriche N/A or DELETE if not current risk factor) Outcome: Progressing   Problem: Ischemic Stroke/TIA Tissue Perfusion: Goal: Complications of ischemic stroke/TIA will be minimized Outcome: Progressing   Problem: Coping: Goal: Will verbalize positive feelings about self Outcome: Progressing Goal: Will identify appropriate support needs Outcome: Progressing   Problem: Health Behavior/Discharge Planning: Goal: Ability to manage health-related needs will improve Outcome: Progressing Goal: Goals will be collaboratively established with patient/family Outcome: Progressing   Problem: Self-Care: Goal: Verbalization of feelings and concerns over difficulty with self-care will improve Outcome: Progressing Goal: Ability to communicate needs accurately will improve Outcome: Progressing   Problem: Nutrition: Goal: Dietary intake will improve Outcome: Progressing   Problem: Education: Goal: Knowledge of General Education information will improve Description: Including pain rating scale, medication(s)/side effects and non-pharmacologic comfort measures Outcome: Progressing   Problem: Clinical Measurements: Goal: Ability to maintain clinical measurements within normal limits will improve Outcome: Progressing Goal: Will remain free from infection Outcome: Progressing Goal: Diagnostic test results will improve Outcome: Progressing Goal: Respiratory complications will improve Outcome: Progressing Goal: Cardiovascular complication will be avoided Outcome: Progressing   Problem: Activity: Goal: Risk for activity intolerance will decrease Outcome: Progressing   Problem: Nutrition: Goal: Adequate nutrition will be maintained Outcome: Progressing   Problem: Coping: Goal: Level of anxiety will  decrease Outcome: Progressing   Problem: Elimination: Goal: Will not experience complications related to bowel motility Outcome: Progressing Goal: Will not experience complications related to urinary retention Outcome: Progressing   Problem: Pain Managment: Goal: General experience of comfort will improve Outcome: Progressing   Problem: Safety: Goal: Ability to remain free from injury will improve Outcome: Progressing   Problem: Skin Integrity: Goal: Risk for impaired skin integrity will decrease Outcome: Progressing   Problem: Education: Goal: Knowledge of disease or condition will improve Outcome: Not Progressing   Problem: Self-Care: Goal: Ability to participate in self-care as condition permits will improve Outcome: Not Progressing   Problem: Nutrition: Goal: Risk of aspiration will decrease Outcome: Not Progressing   Problem: Health Behavior/Discharge Planning: Goal: Ability to manage health-related needs will improve Outcome: Not Progressing

## 2023-06-05 NOTE — Progress Notes (Addendum)
[facility] Resident Progress Note  Name: Eric [name] MRN: [id number] DOB: April 25, 1941  Subjective:  Denies pain or discomfort today. Son at bedside thinks his dad continues to look weaker and is spending more time asleep.   Intake/Output Summary (Last 24 hours) at 06/05/2023 1353 Last data filed at 06/05/2023 0000 Gross per 24 hour  Intake 1038.55 ml  Output --  Net 1038.55 ml    Vitals:  Vitals:   06/04/23 2000 06/05/23 0000 06/05/23 0400 06/05/23 1247  BP: 123/76 118/64 122/70 (!) 139/97  Pulse: (!) 106 93 93 96  Resp: (!) 26 15 (!) 24 18  Temp: (!) 101 F (38.3 C) 99.7 F (37.6 C) 100.2 F (37.9 C) 98.6 F (37 C)  TempSrc: Axillary Axillary Axillary Axillary  SpO2: 90% 95% 96% 95%  Weight:      Height:         Physical Exam:  No distress, cachectic and ill-appearing Heart rate is normal, rhythm regular, radial pulse strong Breathing appears unlabored on supplemental oxygen via nasal cannula, anterior lung sounds distant but clear Skin warm and dry No peripheral edema Foley with scant urine Alert to voice but doesn't open eyes, shakes head yes/no for questions  Medications reviewed   Labs:     Latest Ref Rng & Units 06/05/2023    3:10 AM 06/04/2023    3:13 AM 06/03/2023    3:15 AM  BMP  Glucose 70 - 99 mg/dL 91  578  469   BUN 8 - 23 mg/dL 73  55  36   Creatinine 0.61 - 1.24 mg/dL 6.29  5.28  4.13   Sodium 135 - 145 mmol/L 143  144  142   Potassium 3.5 - 5.1 mmol/L 4.8  4.5  4.0   Chloride 98 - 111 mmol/L 107  109  107   CO2 22 - 32 mmol/L 20  21  20    Calcium 8.9 - 10.3 mg/dL 8.5  8.5  8.8      Assessment/Plan:  82 year old with history of CVA admitted after fall and found to have acute cerebral infarct with hospital course complicated by multiorgan dysfunction and acute renal failure.  Acute renal failure Creatinine and BUN trending up, remains anuric. Still feel strongly that dialysis would hasten demise. Shared concern with son  that this person is nearing end of life due to irreversible illness including multiorgan failure. Son has good insight into father's condition at present but is concerned that other family may not understand grave prognosis.  Acute stroke Per primary and neurology.  Acute liver injury DILI versus ischemic injury.  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  Dysphagia Severe, NPO.  Aspiration pneumonia Query worsening pneumonia with fevers and brisk increase in leukocytosis.  C6 vertebral fracture Non-displaced, in neck brace.  Urinary retention Foley in place.  Goals of care Per above. Appreciate palliative assistance with this challenging case.  Final recommendations pending attending attestation.  Eric Mood MD 06/05/2023, 1:53 PM    Seen and examined independently.  Agree with note and exam as documented above by resident physician and as noted here.  82 has had trouble with secretions and needing to be suctioned more today.  He shakes his head and states that he is not in pain   General elderly gentleman in bed in no acute distress, frail HEENT he is in a neck brace; does track me and nod or shake head  Lungs crackles and transmitted upper airway sounds;  suction device nearby  Heart S1S2 Abdomen soft nontender nondistended, thin Extremities no edema  Psych no anxiety or agitation GU foley in place  # AKI  - Multifactorial with ischemic and prerenal insults.  Has received clinically indicated contrasted studies. He has had a stroke with resultant dysphagia and decreased intake with course also complicated by right renal infarction.  Per charting, foley was placed for urinary retention.  Worsening  - He is not a candidate for dialysis - This has been understandably difficult for his family.  I have recommended a transition to a comfort-based approach and urged his son to contact his primary team about goals of care should those change.   - I have  shared that he is nearing end of life and may pass away even in the next 24-48 hours despite our best efforts  - stop fluids    # Acute stroke - Per primary team and neurology   # Acute liver failure - may be ischemic  - Per primary team   # Right renal infarction - Not a candidate for aggressive intervention   # Dysphagia - Complication of recent stroke - goals of care per primary team and palliative care    # Aspiration pneumonia - Antibiotics per primary team   # Neck fracture - C6 vertebral body fracture - In a brace per primary team  I reached out to the primary team and RN.  His MD is placing order to help with secretions and I have asked if his nurse could suction.  Appreciate all staff support of the patient and his family at this difficult time.  He is near end of life  Eric Emms, MD 4:27 PM 06/05/2023

## 2023-06-06 DIAGNOSIS — I639 Cerebral infarction, unspecified: Secondary | ICD-10-CM | POA: Diagnosis not present

## 2023-06-06 DIAGNOSIS — K719 Toxic liver disease, unspecified: Secondary | ICD-10-CM | POA: Diagnosis not present

## 2023-06-06 DIAGNOSIS — N179 Acute kidney failure, unspecified: Secondary | ICD-10-CM | POA: Diagnosis not present

## 2023-06-06 DIAGNOSIS — Z7189 Other specified counseling: Secondary | ICD-10-CM | POA: Diagnosis not present

## 2023-06-06 DIAGNOSIS — G9340 Encephalopathy, unspecified: Secondary | ICD-10-CM | POA: Diagnosis not present

## 2023-06-06 LAB — BASIC METABOLIC PANEL
Anion gap: 15 (ref 5–15)
BUN: 104 mg/dL — ABNORMAL HIGH (ref 8–23)
CO2: 20 mmol/L — ABNORMAL LOW (ref 22–32)
Calcium: 8.7 mg/dL — ABNORMAL LOW (ref 8.9–10.3)
Chloride: 109 mmol/L (ref 98–111)
Creatinine, Ser: 8.57 mg/dL — ABNORMAL HIGH (ref 0.61–1.24)
GFR, Estimated: 6 mL/min — ABNORMAL LOW (ref >60)
Glucose, Bld: 88 mg/dL (ref 70–99)
Potassium: 5.1 mmol/L (ref 3.5–5.1)
Sodium: 144 mmol/L (ref 135–145)

## 2023-06-06 LAB — CBC
HCT: 31.4 % — ABNORMAL LOW (ref 39.0–52.0)
Hemoglobin: 11.2 g/dL — ABNORMAL LOW (ref 13.0–17.0)
MCH: 27.7 pg (ref 26.0–34.0)
MCHC: 35.7 g/dL (ref 30.0–36.0)
MCV: 77.7 fL — ABNORMAL LOW (ref 80.0–100.0)
Platelets: 415 10*3/uL — ABNORMAL HIGH (ref 150–400)
RBC: 4.04 MIL/uL — ABNORMAL LOW (ref 4.22–5.81)
RDW: 14.6 % (ref 11.5–15.5)
WBC: 35.8 10*3/uL — ABNORMAL HIGH (ref 4.0–10.5)
nRBC: 0.1 % (ref 0.0–0.2)

## 2023-06-06 MED ORDER — HEPARIN (PORCINE) 25000 UT/250ML-% IV SOLN
650.0000 [IU]/h | INTRAVENOUS | Status: DC
  Administered 2023-06-06: 700 [IU]/h via INTRAVENOUS
  Administered 2023-06-08: 750 [IU]/h via INTRAVENOUS
  Administered 2023-06-09: 600 [IU]/h via INTRAVENOUS
  Filled 2023-06-06 (×3): qty 250

## 2023-06-06 MED ORDER — HALOPERIDOL LACTATE 5 MG/ML IJ SOLN
5.0000 mg | Freq: Four times a day (QID) | INTRAMUSCULAR | Status: DC | PRN
  Administered 2023-06-06 – 2023-06-10 (×4): 5 mg via INTRAVENOUS
  Filled 2023-06-06 (×5): qty 1

## 2023-06-06 NOTE — Progress Notes (Signed)
TR[facility] night cross cover note:  I was notified by RN that this patient is agitated, pulling at their lines,  peripheral IV, and foley, in spite of b/l mitts, with these behaviors also refractory to attempts at verbal redirection.  In the setting of associated interference with ongoing medical treatment posing potential harm to themself, I have placed order for prn iv haldol for agitation.    Newton Pigg, DO Hospitalist

## 2023-06-06 NOTE — Progress Notes (Signed)
PHARMACY - ANTICOAGULATION CONSULT NOTE  Pharmacy Consult for heparin Indication: AA thrombus/renal infarct   Allergies  Allergen Reactions   Hctz [Hydrochlorothiazide] Other (See Comments)    "Peeing too much"    Patient Measurements: Height: 5\' 8"  (172.7 cm) Weight:  (Unable to obtain accurate bed weight (currently reading 30.2 kg compared to 57Kg x2 days ago). Pt too weak to stand for standing scale at this time.) IBW/kg (Calculated) : 68.4 Heparin Dosing Weight: 57 kg  Vital Signs: Temp: 98.6 F (37 C) (10/04 1546) Temp Source: Axillary (10/04 1546) BP: 122/63 (10/04 1546) Pulse Rate: 94 (10/04 1546)  Labs: Recent Labs    06/04/23 0313 06/05/23 0310 06/06/23 0840  HGB 11.7* 11.2* 11.2*  HCT 33.5* 32.0* 31.4*  PLT 420* 432* 415*  CREATININE 4.88* 6.54* 8.57*    Estimated Creatinine Clearance: 5.4 mL/min (A) (by C-G formula based on SCr of 8.57 mg/dL (H)).   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  Patient was transitioned to Apixaban 9/25, however patient has declined and has been unable to take PO meds for several days.  Last apixaban dose 9/29.  Pharmacy has been consulted to start heparin.  Patient was previously therapeutic on heparin at 700 units/hr.  Will check aPTT and antiXa level given recent apixaban use  and possible accumulation with worsening renal function.  Suspect can monitor with AntiXa levels alone.  Goal of Therapy:  Heparin level 0.3-0.7 units/ml aPTT 66-102 seconds Monitor platelets by anticoagulation protocol: Yes   Plan:   Start heparin at 700 units/hr Heparin level and aPTT in 8 hours Daily CBC and heparin level.  Aunesti Pellegrino, Judie Bonus 06/06/2023,4:21 PM

## 2023-06-06 NOTE — Plan of Care (Signed)
  Problem: Self-Care: Goal: Verbalization of feelings and concerns over difficulty with self-care will improve Outcome: Progressing Goal: Ability to communicate needs accurately will improve Outcome: Progressing   Problem: Nutrition: Goal: Risk of aspiration will decrease Outcome: Progressing   Problem: Clinical Measurements: Goal: Cardiovascular complication will be avoided Outcome: Progressing   Problem: Coping: Goal: Level of anxiety will decrease Outcome: Progressing   Problem: Pain Managment: Goal: General experience of comfort will improve Outcome: Progressing

## 2023-06-06 NOTE — Progress Notes (Signed)
SLP Cancellation Note  Patient Details Name: Eric [name] MRN: [id number] DOB: 22-Feb-1941   Cancelled treatment:       Reason Eval/Treat Not Completed: Patient not medically ready. Have spoken with son and daughter and MD regarding dysphagia. Pt is not in a condition to benefit from therapy at this time. Will sign off. Please reach out with questions.    Corie Vavra, Riley Nearing 06/06/2023, 8:42 AM

## 2023-06-06 NOTE — Progress Notes (Addendum)
PROGRESS NOTE        PATIENT DETAILS Name: Eric [name] Age: 82 y.o. Sex: male Date of Birth: 1941/07/04 Admit Date: 05/22/2023 Admitting Physician Angie Fava, DO [id number], Georgina Quint, MD  Brief Summary:  Patient is a 82 y.o.  male with history of CVA, HTN-who sustained a mechanical fall at home-upon further evaluation-was found to have acute CVA and C6 vertebral body fracture.  Patient was initially evaluated at [facility] and transferred to [facility] service at 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  Acute CVA with right frontal lobe, was confirmed by MRI on 9/20, patient was seen by neurology, and neurosurgery, as well patient was noted to have urinary retention, secondary to BPH, with Foley catheter was placed by urology Dr. Alvester Morin, CT abdomen pelvis with contrast was obtained secondary to abdominal pain, which was significant foronocclusive, pedunculated, partially adherent thrombus in the mid abdominal aorta  which occludes the ostium of the right renal artery, vascular surgery consulted, recommended anticoagulation after patient was cleared by neurology, patient continues to be significantly deconditioned, with dysphagia in the setting of his stroke, with micro aspiration, who has been treated with pneumonia, he developed elevated LFTs, which thought to be secondary to [facility] as he has been treated with Unasyn, he is on meropenem, function continue to worsen, where he is currently anuric, renal has been consulted, patient is not HD candidate, patient with  multiorgan failure, comorbidities, poor baseline, and progressive decline over the last few month, so palliative medicine has been following closely with family regarding goals of care.   Consults: Neurology, urology, Pall care, GI,renal  Subjective:  Patient remains with some restlessness overnight, complain of pain required as needed fentanyl,   Objective: Vitals: Blood pressure 115/61, pulse 88, temperature 98.5 F (36.9 C), temperature source Axillary, resp. rate 12, height 5\' 8"  (1.727 m), weight 57.6 kg, SpO2 100%.   Exam:  Extremely frail, elderly male, somnolent, open eyes intermittently, does not follow any commands or answer any questions today Patient is wearing c-collar Symmetrical chest wall movement, diminished air entry at the bases with scattered Rales Regular rate and rhythm, no rubs or gallops Abdomen soft, scaphoid Limited with no edema, clubbing or cyanosis    Assessment/Plan:  Acute Ischemic Infarct:  Subacute left parietal infarct with possible hemorrhage infarct vs hemorrhagic contusion in right frontal region -Likely due to embolic versus vessel disease -Neurology input greatly appreciated, CTA head and neck significant for 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  Pain regimen adjusted for better pain control.  HTN -Initially allowing for permissive hypertension, blood pressure  currently acceptable, no changes to be done today  Hypokalemia.   -  Replaced.  Acute renal failure -creatinine continues trending up, appears to be anuric, renal input greatly appreciated, multifactorial, with ischemic and prerenal insults, especially he received clinically indicated contrasted studies as well -This appears to be irreversible, rapidly progressive, patient at end-of-life which I have discussed with multiple family members, please see discussion below  Transaminitis-elevated LFTs -Likely related to DILI from Unasyn, Unasyn stopped 9/29 -Avoid penicillins  Aspiration pneumonia  -Patient high risk for aspiration, did not do well with MBS study 06/03/2023  -Patient continues to have worsening pneumonia on imaging, and clinically as he remains febrile, with worsening leukocytosis despite being treated appropriately with meropenem, confirmed by chest x-ray 10/2 which showed worsening pneumonia . -He remains n.p.o. as high risk for aspiration  -Patient is not safe to swallow secondary to aspiration, and encephalopathy, cussed with son on 10/3, son was asking about nutrition including TPN, I told him that TPN is not indicated here given patient has no bowel obstruction, as well he is having transaminitis, I have discussed with him about cortrak tube feed, he would like to hold on that for now  HLD   -Crestor when able to swallow  BPH  - Proscar but developed urinary retention, urology was called Foley catheter was placed by Dr. Alvester Morin on 05/25/2023.   Abdominal aortic thrombus, with right renal artery occlusion  -Vascular surgery input greatly appreciated, plan for repeat CTA once stable, but unfortunately renal function does not allow for any IV contrast . -Continue with full anticoagulation .  Patchy increased sclerosis within the T12 vertebral body, nonspecific.  - Outpatient further evaluation with nonemergent MRI of the thoracic spine.  Hypokalemia.  Replace.  Acute encephalopathy -Multifactorial, in the setting of CVA presentation,  infection, uremia with worsening renal failure.  As well hospital delirium  Goals of care -With multiple comorbidities, cachectic, with failure to thrive, poor oral intake, high risk for aspiration, acute CVA, with worsening renal failure, anuric, not HD candidate, liver injury, encephalopathic, patient with progressive decline for a few month prior to hospitalization, I have discussed with multiple family members, have discussed multiple family members over the last couple days, I have informed them patient is with multiorgan failure, he is nearing end-of-life, with a irreversible decline, I have recommended DNR, comfort care, he is unlikely to survive his multiorgan failure, palliative medicine is assisting with goals of care discussion, they will try to arrange family meeting over the weekend.   Underweight: Estimated body mass index is 19.31 kg/m as calculated from the following:   Height as of this encounter: 5\' 8"  (1.727 m).   Weight as of this encounter: 57.6 kg.   Code status:   Code Status: Full Code   DVT Prophylaxis: SCDs Start: 05/22/23 2315   Family Communication: I have discussed with son Jerilynn Som on 10/2, and 10/3 AM, I have discussed with son [name], and Sister Steward Drone this morning, and discussed with his daughter [name] at bedside today.       Disposition Plan: Status is: Inpatient Remains inpatient appropriate because: Severity of illness   Planned Discharge Destination:Home health   Diet: Diet Order             Diet NPO time specified  Diet effective now                   MEDICATIONS: Scheduled Meds:  carvedilol  6.25 mg Oral BID WC   Chlorhexidine  Gluconate Cloth  6 each Topical Daily   finasteride  5 mg Oral Daily   mirtazapine  15 mg Oral QHS   tamsulosin  0.4 mg Oral Daily   Continuous Infusions:  meropenem (MERREM) IV 500 mg (06/06/23 0828)     PRN Meds:.acetaminophen **OR** acetaminophen, fentaNYL (SUBLIMAZE) injection, hydrALAZINE,  labetalol, melatonin, naLOXone (NARCAN)  injection, ondansetron (ZOFRAN) IV, polyethylene glycol   I have personally reviewed following labs and imaging studies  LABORATORY DATA:  Recent Labs  Lab 06/01/23 1145 06/02/23 0319 06/03/23 0315 06/04/23 0313 06/05/23 0310 06/06/23 0840  WBC 11.0* 10.4 13.1* 23.4* 40.5* 35.8*  HGB 11.9* 13.2 12.6* 11.7* 11.2* 11.2*  HCT 34.8* 38.0* 36.7* 33.5* 32.0* 31.4*  PLT 323 363 401* 420* 432* 415*  MCV 78.4* 80.0 77.3* 77.2* 78.4* 77.7*  MCH 26.8 27.8 26.5 27.0 27.5 27.7  MCHC 34.2 34.7 34.3 34.9 35.0 35.7  RDW 15.0 14.7 14.7 14.6 14.8 14.6  LYMPHSABS 0.9 0.5* 0.6* 1.2 1.2  --   MONOABS 1.2* 0.7 1.0 0.9 1.6*  --   EOSABS 0.0 0.0 0.0 0.0 0.0  --   BASOSABS 0.0 0.0 0.0 0.0 0.0  --     Recent Labs  Lab 05/30/23 1746 05/31/23 0721 06/01/23 1145 06/01/23 1146 06/01/23 1957 06/02/23 0319 06/02/23 1352 06/03/23 0315 06/04/23 0313 06/05/23 0310 06/06/23 0840  NA  --    < > 139  --   --  140  --  142 144 143 144  K  --    < > 3.6  --   --  3.7  --  4.0 4.5 4.8 5.1  CL  --    < > 102  --   --  106  --  107 109 107 109  CO2  --    < > 23  --   --  21*  --  20* 21* 20* 20*  ANIONGAP  --    < > 14  --   --  13  --  15 14 16* 15  GLUCOSE  --    < > 123*  --   --  96  --  146* 183* 91 88  BUN  --    < > 21  --   --  23  --  36* 55* 73* 104*  CREATININE  --    < > 2.19*  --   --  2.47*  --  3.26* 4.88* 6.54* 8.57*  AST  --    < > 414*  --   --  521*  --  349* 239* 431*  --   ALT  --    < > 434*  --   --  518*  --  513* 370* 360*  --   ALKPHOS  --    < > 102  --   --  135*  --  148* 149* 164*  --   BILITOT  --    < > 0.8  --   --  0.9  --  1.1 1.0 1.2  --   ALBUMIN  --    < > 1.9*  --   --  2.0*  --  2.0* 1.7* <1.5*  --   CRP  --   --  24.8*  --   --  24.9*  --  31.8* 36.2*  --   --   PROCALCITON  --    < > 0.63  --   --  0.64  --  3.19 6.25 7.21  --   INR 1.5*  --   --   --   --   --  1.9* 2.0*  --   --   --   AMMONIA  --   --   --   --   --    --  17  --   --   --   --   BNP  --   --   --  37.4 40.8 30.5  --  50.2 56.5  --   --   MG  --   --  2.2  --   --  2.1  --  2.2 2.4  --   --   CALCIUM  --    < > 8.6*  --   --  8.7*  --  8.8* 8.5* 8.5* 8.7*   < > = values in this interval not displayed.    Lab Results  Component Value Date   CHOL 116 05/23/2023   HDL 65 05/23/2023   LDLCALC 39 05/23/2023   LDLDIRECT 163.3 05/26/2008   TRIG 59 05/23/2023   CHOLHDL 1.8 05/23/2023   Lab Results  Component Value Date   HGBA1C 6.3 (H) 05/26/2023      Recent Labs  Lab 05/30/23 1746 05/31/23 0721 06/01/23 1145 06/01/23 1146 06/01/23 1957 06/02/23 0319 06/02/23 1352 06/03/23 0315 06/04/23 0313 06/05/23 0310 06/06/23 0840  CRP  --   --  24.8*  --   --  24.9*  --  31.8* 36.2*  --   --   PROCALCITON  --    < > 0.63  --   --  0.64  --  3.19 6.25 7.21  --   INR 1.5*  --   --   --   --   --  1.9* 2.0*  --   --   --   AMMONIA  --   --   --   --   --   --  17  --   --   --   --   BNP  --   --   --  37.4 40.8 30.5  --  50.2 56.5  --   --   MG  --   --  2.2  --   --  2.1  --  2.2 2.4  --   --   CALCIUM  --    < > 8.6*  --   --  8.7*  --  8.8* 8.5* 8.5* 8.7*   < > = values in this interval not displayed.    RADIOLOGY STUDIES/RESULTS: No results found.   LOS: 15 days   Signature  -    Huey Bienenstock M.D on 06/06/2023 at 3:34 PM   -  To page go to [email or web address]

## 2023-06-06 NOTE — Progress Notes (Addendum)
[facility] Resident Progress Note  Name: Eric [name] MRN: [id number] DOB: [birth year]  Subjective:  Denies pain and breathing difficulty today. Family not present at bedside at time of my arrival.   Intake/Output Summary (Last 24 hours) at 06/06/2023 1035 Last data filed at 06/06/2023 0600 Gross per 24 hour  Intake --  Output 95 ml  Net -95 ml    Vitals:  Vitals:   06/06/23 0400 06/06/23 0747 06/06/23 0800 06/06/23 1000  BP: 105/64 114/64 99/64 119/60  Pulse: 85 88 89 85  Resp: 20 (!) 21 20 19   Temp: 97.8 F (36.6 C)  98.8 F (37.1 C)   TempSrc: Oral  Axillary   SpO2: 98% 92% 97% 97%  Weight:      Height:         Physical Exam:  No distress, cachexia Heart rate and rhythm normal, no appreciable murmurs Breathing is unlabored but irregular with pauses, lung with coarse crackles anteriorly Skin warm and dry Foley with scant urine Alert to voice, opens eyes, answers questions with yes/no answer.  Medications reviewed   Labs:     Latest Ref Rng & Units 06/06/2023    8:40 AM 06/05/2023    3:10 AM 06/04/2023    3:13 AM  BMP  Glucose 70 - 99 mg/dL 88  91  086   BUN 8 - 23 mg/dL 578  73  55   Creatinine 0.61 - 1.24 mg/dL 4.69  6.29  5.28   Sodium 135 - 145 mmol/L 144  143  144   Potassium 3.5 - 5.1 mmol/L 5.1  4.8  4.5   Chloride 98 - 111 mmol/L 109  107  109   CO2 22 - 32 mmol/L 20  20  21    Calcium 8.9 - 10.3 mg/dL 8.7  8.5  8.5      Assessment/Plan:  82 year old male with history of CVA admitted after fall and found to have acute cerebral infarct with hospital course complicated by multiorgan dysfunction and renal failure.  Acute renal failure Worsening per serum creatinine and BUN. Remains anuric. Multifactorial with ischemic and prerenal insults. Clinically indicated contrasted studies have also been performed. Irreversible decline, nearing end of life.  Acute stroke Per primary and neurology.   Acute liver injury DILI versus ischemic  injury.   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   Dysphagia Severe, NPO.   Aspiration pneumonia Antibiotics per primary team.   C6 vertebral fracture Non-displaced, in neck brace.   Urinary retention Foley in place.   Goals of care Per above. Appreciate palliative assistance with this challenging case.  Final recommendations pending attending attestation.  Marrianne Mood MD 06/06/2023, 10:35 AM    Seen and examined independently.  Agree with note and exam as documented above by resident physician Dr. Benito Mccreedy and as noted here.   His wife of over 50 years is at his bedside.  His son and daughter are there as well as his grandson and great-granddaughter.  His wife states that he has asked to eat but has been told that he can't.  We discussed team's concerns re: his swallowing but also that eating is something that certainly can provide comfort.  He would be at risk for aspiration but again if comfort is the focus that some people are willing to take that risk to have the comfort of trying to eat.  His wife has faith that "Jesus is a healer and as long as he is breathing  there is hope".  We thoughtfully discussed that I am concerned that he is dying.   General elderly gentleman in bed in no acute distress, frail HEENT he is in a neck brace Lungs occ crackles and transmitted upper airway sounds Heart S1S2 Abdomen soft nontender nondistended, thin Extremities no edema  Psych no anxiety or agitation Neuro - he keeps his eyes closed but is interactive; shakes head "no" when asked if he is in pain GU foley in place   # AKI  - Multifactorial with ischemic and prerenal insults.  Has received clinically indicated contrasted studies. He has had a stroke with resultant dysphagia and decreased intake with course also complicated by right renal infarction.  Per charting, foley was placed for urinary retention.  Worsening  - He is not a candidate for dialysis - This has  been understandably difficult for his family.  I have recommended a transition to a comfort-based approach and have urged his family to contact his primary team about goals of care should those change.   - I have shared that he is nearing end of life and may pass away even in the next 24 -48 hours despite our best efforts    # Acute stroke - Per primary team and neurology   # Acute liver failure - may be ischemic  - Per primary team   # Right renal infarction - Not a candidate for aggressive intervention   # Dysphagia - Complication of recent stroke - goals of care per primary team and palliative care    # Aspiration pneumonia - Antibiotics per primary team   # Neck fracture - C6 vertebral body fracture - In a brace per primary team   Nephrology will sign off.  Would continue to advocate for a comfort-focused approach.  Appreciate the primary team.  Please do not hesitate to contact me with any questions   Estanislado Emms, MD 06/06/2023 7:23 PM

## 2023-06-06 NOTE — Progress Notes (Signed)
Daily Progress Note   Patient Name: Eric [name]       Date: 06/06/2023 DOB: [birth year]  Age: 82 y.o. MRN#: [id number] Attending Physician: Starleen Arms, MD Primary Care Physician: Tresa Garter, MD Admit Date: 05/22/2023  Reason for Consultation/Follow-up: Establishing goals of care  Length of Stay: 15  Current Medications: Scheduled Meds:   apixaban  5 mg Oral BID   carvedilol  6.25 mg Oral BID WC   Chlorhexidine Gluconate Cloth  6 each Topical Daily   finasteride  5 mg Oral Daily   mirtazapine  15 mg Oral QHS   tamsulosin  0.4 mg Oral Daily    Continuous Infusions:  meropenem (MERREM) IV 500 mg (06/06/23 0828)    PRN Meds: acetaminophen **OR** acetaminophen, fentaNYL (SUBLIMAZE) injection, hydrALAZINE, labetalol, melatonin, naLOXone (NARCAN)  injection, ondansetron (ZOFRAN) IV, polyethylene glycol  Physical Exam Vitals reviewed.  Constitutional:      General: He is sleeping.     Appearance: He is cachectic. He is ill-appearing.     Interventions: Nasal cannula in place.  Cardiovascular:     Rate and Rhythm: Normal rate.  Pulmonary:     Effort: Tachypnea present.             Vital Signs: BP 115/61   Pulse 88   Temp 98.5 F (36.9 C) (Axillary)   Resp 12   Ht 5\' 8"  (1.727 m)   Wt 57.6 kg   SpO2 100%   BMI 19.31 kg/m  SpO2: SpO2: 100 % O2 Device: O2 Device: Nasal Cannula O2 Flow Rate: O2 Flow Rate (L/min): 2 L/min        Palliative Assessment/Data: 10% NPO      Patient Active Problem List   Diagnosis Date Noted   Elevated liver enzymes 06/02/2023   Drug-induced liver injury 06/02/2023   Acute encephalopathy 05/23/2023   C6 cervical fracture (HCC) 05/23/2023   Elevated troponin 05/23/2023   BPH (benign prostatic hyperplasia)  05/23/2023   Acute ischemic stroke (HCC) 05/22/2023   Confusion 03/13/2023   Abnormal CXR 02/23/2023   Subarachnoid bleed (HCC) 02/22/2023   CVA (cerebral vascular accident) (HCC) 02/22/2023   Anemia 01/02/2023   Restless legs syndrome (RLS) 04/24/2021   Abnormal ultrasound of bladder 12/25/2020   UTI (urinary tract infection) 09/25/2020   Cervical pain (neck) 05/15/2018  Abdominal bloating 05/15/2018   Arm pain, diffuse, left 12/31/2017   Well adult exam 07/30/2017   Vitamin D deficiency 12/18/2016   Osteoarthritis 12/18/2016   B12 deficiency 09/16/2016   Hypokalemia 08/16/2016   Dizziness and giddiness 11/15/2015   Blurred vision 11/15/2015   Headache 07/07/2013   Hyperlipemia 07/07/2013   Acute intracranial hemorrhage (HCC) 07/03/2013   Pain of both eyes 07/02/2013   Acute bronchitis 07/02/2013   ERECTILE DYSFUNCTION, 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 male with past medical history of stroke, essential pretension, who was admitted to [facility] on 05/22/2023 with suspected acute versus subacute ischemic stroke after presenting from home to [facility] [place] ED for evaluation of fall.   Today's Discussion: Patient was sleeping in bed in NAD. Son Eric [name] is at bedside. He tells me that the provider just updated him on the seriousness of his father's condition and that he is planning to get his siblings together for a meeting. We discuss his multiple comorbidities and continued risk for aspiration. We also briefly discussed what comfort measures would look like for the patient. The family face treatment option decisions advanced directive decisions for the patient.  He is concerned with how his mother will handle the news. Encourage him to call PMT when they have a meeting time. Encourage him to call PMT with questions or  concerns.  14:40 Left voicemail for Eric [name] re: can we meet tomorrow?   Recommendations/Plan: Continue full code/ full scope Encouraged continued discussion around goals of care Continued PMT support.   Code Status:    Code Status Orders  (From admission, onward)           Start     Ordered   05/22/23 2315  Full code  Continuous       Question:  By:  Answer:  Consent: discussion documented in EHR   05/22/23 2314           Extensive chart review has been completed prior to seeing the patient and speaking with his son including labs, vital signs, imagine, progress/consult notes, orders, medications, and available advance directive documents.  Care plan was discussed with Dr. Randol Kern  Time spent: 25 minutes  Thank you for allowing the Palliative Medicine Team to assist in the care of this patient.     Sherryll Burger, NP  Please contact Palliative Medicine Team phone at [phone] for questions and concerns.

## 2023-06-06 NOTE — Progress Notes (Signed)
Occupational Therapy Treatment Patient Details Name: Eric [name] MRN: [id number] DOB: November 27, 1940 Today's Date: 06/06/2023   History of present illness 82 y.o. male presents to [facility] hospital on 05/23/2023 as a transfer from [facility] ED after a fall. CT head suggestive of acute to subacute L parietal infarct, as well as IPH in R frontal lobe. 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 Pt also found to have C6 vertebral body fx. PMH includes CVA (02/2023), HTN.   OT comments  Pt seems to demonstrate improved tolerance for therapy sessions, being more responsive and lifting BLEs/BUEs when prompted. Pt continues to need max A - total A for mobility and ADLs, and fidgets with his lines and leads often. Recommend pt continue to have +2 assist for mobility due to balance impairments. Family hopeful for progress and report completing ROM with pt, reinforced ROM and gave handout on AAROM exercises. OT to continue to progress pt as able, DC plans remain appropriate.       If plan is discharge home, recommend the following:  Two people to help with walking and/or transfers;A lot of help with bathing/dressing/bathroom;Assistance with feeding;Assistance with cooking/housework;Direct supervision/assist for financial management;Supervision due to cognitive status;Direct supervision/assist for medications management;Help with stairs or ramp for entrance   Equipment Recommendations  Other (comment) (TBD at next level of care)    Recommendations for Other Services      Precautions / Restrictions Precautions Precautions: Fall Required Braces or Orthoses: Other Brace Other Brace: soft cervical collar for comfort Restrictions Weight Bearing Restrictions: No       Mobility Bed Mobility Overal bed mobility: Needs Assistance Bed Mobility: Rolling Rolling: Total assist   Supine to sit: Max assist, HOB elevated, Used rails, +2 for physical assistance Sit to supine:  Total assist, HOB elevated   General bed mobility comments: EOB sitting balance Max A +2, strong posterior lean and reduced core strength.    Transfers Overall transfer level: Needs assistance Equipment used: 1 person hand held assist Transfers: Sit to/from Stand (STS x2) Sit to Stand: Total assist           General transfer comment: rec +2 for OOB     Balance   Sitting-balance support: Bilateral upper extremity supported, Feet supported Sitting balance-Leahy Scale: Poor       Standing balance-Leahy Scale: Zero Standing balance comment: Total A                           ADL either performed or assessed with clinical judgement   ADL       Grooming: Maximal assistance;Bed level Grooming Details (indicate cue type and reason): handed wash cloth and prompted pt to wash his own face, needs Max A but does initiate with encouragement                               General ADL Comments: Educated pt daugther on AAROM exercises to assist in protecting joint integrity.    Extremity/Trunk Assessment              Vision       Perception     Praxis      Cognition Arousal: Lethargic Behavior During Therapy: Flat affect Overall Cognitive Status: Impaired/Different from baseline Area of Impairment: Following commands, Safety/judgement, Problem solving  Following Commands: Follows one step commands inconsistently Safety/Judgement: Decreased awareness of safety, Decreased awareness of deficits   Problem Solving: Slow processing, Requires verbal cues, Requires tactile cues General Comments: Pt with eyes open majority of session but does keep them open with cueing. responding to questions with yes/no and occasionally small sentences        Exercises      Shoulder Instructions       General Comments VSS on supplemental 02, daughter present and helpful throughout session    Pertinent Vitals/ Pain       Pain  Assessment Pain Assessment: Faces Faces Pain Scale: Hurts little more Pain Location: "all over" Pain Descriptors / Indicators: Discomfort, Grimacing, Guarding, Aching, Moaning Pain Intervention(s): Monitored during session, Repositioned, Patient requesting pain meds-RN notified  Home Living                                          Prior Functioning/Environment              Frequency  Min 1X/week        Progress Toward Goals  OT Goals(current goals can now be found in the care plan section)  Progress towards OT goals: Progressing toward goals  Acute Rehab OT Goals OT Goal Formulation: With patient/family Time For Goal Achievement: 06/11/23 Potential to Achieve Goals: Fair  Plan      Co-evaluation                 AM-PAC OT "6 Clicks" Daily Activity     Outcome Measure   Help from another person eating meals?: Total Help from another person taking care of personal grooming?: A Lot Help from another person toileting, which includes using toliet, bedpan, or urinal?: Total Help from another person bathing (including washing, rinsing, drying)?: Total Help from another person to put on and taking off regular upper body clothing?: Total Help from another person to put on and taking off regular lower body clothing?: Total 6 Click Score: 7    End of Session    OT Visit Diagnosis: Unsteadiness on feet (R26.81);Other abnormalities of gait and mobility (R26.89);Muscle weakness (generalized) (M62.81);Low vision, both eyes (H54.2);Pain Pain - Right/Left: Right Pain - part of body:  (back)   Activity Tolerance Patient tolerated treatment well   Patient Left in bed;with bed alarm set;with family/visitor present;with call bell/phone within reach   Nurse Communication Mobility status        Time: 4098-1191 OT Time Calculation (min): 30 min  Charges: OT General Charges $OT Visit: 1 Visit OT Treatments $Therapeutic Activity: 23-37  mins  06/06/2023  AB, OTR/L  Acute Rehabilitation Services  Office: [phone]   Tristan Schroeder 06/06/2023, 2:58 PM

## 2023-06-07 DIAGNOSIS — S12501D Unspecified nondisplaced fracture of sixth cervical vertebra, subsequent encounter for fracture with routine healing: Secondary | ICD-10-CM | POA: Diagnosis not present

## 2023-06-07 DIAGNOSIS — N179 Acute kidney failure, unspecified: Secondary | ICD-10-CM | POA: Diagnosis not present

## 2023-06-07 DIAGNOSIS — N182 Chronic kidney disease, stage 2 (mild): Secondary | ICD-10-CM

## 2023-06-07 DIAGNOSIS — Z7189 Other specified counseling: Secondary | ICD-10-CM | POA: Diagnosis not present

## 2023-06-07 DIAGNOSIS — I639 Cerebral infarction, unspecified: Secondary | ICD-10-CM | POA: Diagnosis not present

## 2023-06-07 LAB — BASIC METABOLIC PANEL
Anion gap: 17 — ABNORMAL HIGH (ref 5–15)
Anion gap: 19 — ABNORMAL HIGH (ref 5–15)
BUN: 128 mg/dL — ABNORMAL HIGH (ref 8–23)
BUN: 132 mg/dL — ABNORMAL HIGH (ref 8–23)
CO2: 18 mmol/L — ABNORMAL LOW (ref 22–32)
CO2: 17 mmol/L — ABNORMAL LOW (ref 22–32)
Calcium: 8.5 mg/dL — ABNORMAL LOW (ref 8.9–10.3)
Calcium: 8.5 mg/dL — ABNORMAL LOW (ref 8.9–10.3)
Chloride: 108 mmol/L (ref 98–111)
Chloride: 108 mmol/L (ref 98–111)
Creatinine, Ser: 9.95 mg/dL — ABNORMAL HIGH (ref 0.61–1.24)
Creatinine, Ser: 10.17 mg/dL — ABNORMAL HIGH (ref 0.61–1.24)
GFR, Estimated: 5 mL/min — ABNORMAL LOW (ref >60)
GFR, Estimated: 5 mL/min — ABNORMAL LOW (ref >60)
Glucose, Bld: 76 mg/dL (ref 70–99)
Glucose, Bld: 179 mg/dL — ABNORMAL HIGH (ref 70–99)
Potassium: 5.5 mmol/L — ABNORMAL HIGH (ref 3.5–5.1)
Potassium: 5.5 mmol/L — ABNORMAL HIGH (ref 3.5–5.1)
Sodium: 143 mmol/L (ref 135–145)
Sodium: 144 mmol/L (ref 135–145)

## 2023-06-07 LAB — APTT
aPTT: 68 s — ABNORMAL HIGH (ref 24–36)
aPTT: 68 s — ABNORMAL HIGH (ref 24–36)

## 2023-06-07 LAB — CBC
HCT: 30.9 % — ABNORMAL LOW (ref 39.0–52.0)
Hemoglobin: 11 g/dL — ABNORMAL LOW (ref 13.0–17.0)
MCH: 26.6 pg (ref 26.0–34.0)
MCHC: 35.6 g/dL (ref 30.0–36.0)
MCV: 74.8 fL — ABNORMAL LOW (ref 80.0–100.0)
Platelets: 398 10*3/uL (ref 150–400)
RBC: 4.13 MIL/uL — ABNORMAL LOW (ref 4.22–5.81)
RDW: 14.3 % (ref 11.5–15.5)
WBC: 23.6 10*3/uL — ABNORMAL HIGH (ref 4.0–10.5)
nRBC: 0.1 % (ref 0.0–0.2)

## 2023-06-07 LAB — HEPARIN LEVEL (UNFRACTIONATED)
Heparin Unfractionated: 0.67 [IU]/mL (ref 0.30–0.70)
Heparin Unfractionated: 0.59 [IU]/mL (ref 0.30–0.70)

## 2023-06-07 LAB — GLUCOSE, CAPILLARY: Glucose-Capillary: 159 mg/dL — ABNORMAL HIGH (ref 70–99)

## 2023-06-07 LAB — POTASSIUM: Potassium: 5.4 mmol/L — ABNORMAL HIGH (ref 3.5–5.1)

## 2023-06-07 MED ORDER — CALCIUM GLUCONATE 10 % IV SOLN: 1.0000 g | Freq: Once | INTRAVENOUS | Status: DC

## 2023-06-07 MED ORDER — SODIUM BICARBONATE 8.4 % IV SOLN
50.0000 meq | Freq: Once | INTRAVENOUS | Status: AC
  Administered 2023-06-07: 50 meq via INTRAVENOUS
  Filled 2023-06-07: qty 50

## 2023-06-07 MED ORDER — DEXTROSE 50 % IV SOLN
1.0000 | Freq: Once | INTRAVENOUS | Status: AC
  Administered 2023-06-07: 50 mL via INTRAVENOUS
  Filled 2023-06-07: qty 50

## 2023-06-07 MED ORDER — SODIUM POLYSTYRENE SULFONATE 15 GM/60ML PO SUSP
30.0000 g | Freq: Once | ORAL | Status: AC
  Administered 2023-06-07: 30 g via RECTAL
  Filled 2023-06-07: qty 120

## 2023-06-07 MED ORDER — CALCIUM GLUCONATE-NACL 1-0.675 GM/50ML-% IV SOLN
1.0000 g | Freq: Once | INTRAVENOUS | Status: AC
  Administered 2023-06-07: 1000 mg via INTRAVENOUS
  Filled 2023-06-07: qty 50

## 2023-06-07 MED ORDER — INSULIN ASPART 100 UNIT/ML IV SOLN
5.0000 [IU] | Freq: Once | INTRAVENOUS | Status: AC
  Administered 2023-06-07: 5 [IU] via INTRAVENOUS

## 2023-06-07 NOTE — Progress Notes (Addendum)
Daily Progress Note   Patient Name: DEJAN CARNEVALE       Date: 06/07/2023 DOB: 1941-06-06  Age: 82 y.o. MRN#: [id number] Attending Physician: Starleen Arms, MD Primary Care Physician: Tresa Garter, MD Admit Date: 05/22/2023  Reason for Consultation/Follow-up: Establishing goals of care  Length of Stay: 16  Current Medications: Scheduled Meds:   carvedilol  6.25 mg Oral BID WC   Chlorhexidine Gluconate Cloth  6 each Topical Daily   finasteride  5 mg Oral Daily   mirtazapine  15 mg Oral QHS   tamsulosin  0.4 mg Oral Daily    Continuous Infusions:  heparin 700 Units/hr (06/06/23 1657)   meropenem (MERREM) IV Stopped (06/06/23 0900)    PRN Meds: acetaminophen **OR** acetaminophen, fentaNYL (SUBLIMAZE) injection, haloperidol lactate, hydrALAZINE, labetalol, melatonin, naLOXone (NARCAN)  injection, ondansetron (ZOFRAN) IV, polyethylene glycol  Physical Exam Vitals reviewed.  Constitutional:      General: He is sleeping.     Appearance: He is cachectic. He is ill-appearing.     Interventions: Nasal cannula in place.     Comments: Soft c-collar  Cardiovascular:     Rate and Rhythm: Normal rate.  Pulmonary:     Effort: Tachypnea present.             Vital Signs: BP 135/64 (BP Location: Right Arm)   Pulse 90   Temp (!) 97.2 F (36.2 C) (Oral)   Resp 14   Ht 5\' 8"  (1.727 m)   Wt 57.6 kg   SpO2 98%   BMI 19.31 kg/m  SpO2: SpO2: 98 % O2 Device: O2 Device: Nasal Cannula O2 Flow Rate: O2 Flow Rate (L/min): 2 L/min        Palliative Assessment/Data: 10% NPO      Patient Active Problem List   Diagnosis Date Noted   Elevated liver enzymes 06/02/2023   Drug-induced liver injury 06/02/2023   Acute encephalopathy 05/23/2023   C6 cervical fracture (HCC)  05/23/2023   Elevated troponin 05/23/2023   BPH (benign prostatic hyperplasia) 05/23/2023   Acute ischemic stroke (HCC) 05/22/2023   Confusion 03/13/2023   Abnormal CXR 02/23/2023   Subarachnoid bleed (HCC) 02/22/2023   CVA (cerebral vascular accident) (HCC) 02/22/2023   Anemia 01/02/2023   Restless legs syndrome (RLS) 04/24/2021   Abnormal ultrasound of bladder 12/25/2020  UTI (urinary tract infection) 09/25/2020   Cervical pain (neck) 05/15/2018   Abdominal bloating 05/15/2018   Arm pain, diffuse, left 12/31/2017   Well adult exam 07/30/2017   Vitamin D deficiency 12/18/2016   Osteoarthritis 12/18/2016   B12 deficiency 09/16/2016   Hypokalemia 08/16/2016   Dizziness and giddiness 11/15/2015   Blurred vision 11/15/2015   Headache 07/07/2013   Hyperlipemia 07/07/2013   Acute intracranial hemorrhage (HCC) 07/03/2013   Pain of both eyes 07/02/2013   Acute bronchitis 07/02/2013   ERECTILE DYSFUNCTION, 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 male with past medical history of stroke, essential pretension, who was admitted to [facility] on 05/22/2023 with suspected acute versus subacute ischemic stroke after presenting from home to [facility] ED for evaluation of fall.   Today's Discussion: Patient was sleeping in bed in NAD. Patient required dose of haldol last night for agitation and pulling at lines. Son is at bedside. We discuss his multiple comorbidities and continued risk for aspiration. I share that I believe the patient is in his dying time despite the interventions in place. I also share that I am concerned about the patient's code status as he is very frail and a resuscitation attempt would likely cause significant damage to his body.  We also briefly discussed what comfort measures would look like for the patient. The family  face treatment option decisions advanced directive decisions for the patient. We call the patient's sister [name] to set up a time for the family to meet. Plan is to meet today at 3 pm at the patient's bedside.  Encourage them to call PMT with questions or concerns prior to meeting.  1500 Family Meeting: Met with patient's wife, children, and two grandchildren (one by phone). Patient's family has varied knowledge of his medical condition. I reviewed my understanding, including limitations of ongoing interventions, and high risk for further decline despite aggressive treatment efforts. We discussed his renal failure at length. I shared that I believe the patient is in his dying time despite the interventions in place.  I shared my concern around the patient's code status. We had a thorough discussion around the patient's code status. Recommended consideration of DNR status, understanding evidenced-based poor outcomes in similar hospitalized patients, as the cause of the arrest is likely associated with chronic/terminal disease rather than a reversible acute cardio-pulmonary event. Family has decided to change code status to do not attempt resuscitation.  We began discussing scope of care but it was too much for the family. I did assure them that the patient was still FULL scope of care.The patient's wife remains very optimistic that through his faith the patient will be healed from his ailments. We discussed hoping and praying for the best but also being prepared for the worst.  Discussed the importance of continued conversation with family and the medical providers regarding overall plan of care and treatment options, ensuring decisions are within the context of the patient's values and GOCs. Encouraged family to call PMT with questions or concerns.  Recommendations/Plan: Changed to DNR Full scope Encouraged continued discussion around goals of care/ scope of care Continued PMT support.   Code  Status:    Code Status Orders  (From admission, onward)           Start     Ordered   05/22/23 2315  Full code  Continuous       Question:  By:  Answer:  Consent: discussion documented in EHR   05/22/23 2314           Extensive chart review has been completed prior to seeing the patient and speaking with his son including labs, vital signs, imagine, progress/consult notes, orders, medications, and available advance directive documents.  Care plan was discussed with Dr. Randol Kern and bedside RN  Time spent: 25 minutes plus additional 90 minutes  Thank you for allowing the Palliative Medicine Team to assist in the care of this patient.     Sherryll Burger, NP  Please contact Palliative Medicine Team phone at [phone] for questions and concerns.

## 2023-06-07 NOTE — Plan of Care (Signed)
  Problem: Education: Goal: Knowledge of disease or condition will improve Outcome: Progressing Goal: Knowledge of secondary prevention will improve (MUST DOCUMENT ALL) Outcome: Progressing   Problem: Ischemic Stroke/TIA Tissue Perfusion: Goal: Complications of ischemic stroke/TIA will be minimized Outcome: Progressing   Problem: Coping: Goal: Will verbalize positive feelings about self Outcome: Progressing Goal: Will identify appropriate support needs Outcome: Progressing   Problem: Health Behavior/Discharge Planning: Goal: Ability to manage health-related needs will improve Outcome: Progressing Goal: Goals will be collaboratively established with patient/family Outcome: Progressing   Problem: Self-Care: Goal: Ability to participate in self-care as condition permits will improve Outcome: Progressing Goal: Verbalization of feelings and concerns over difficulty with self-care will improve Outcome: Progressing   Problem: Nutrition: Goal: Risk of aspiration will decrease Outcome: Progressing Goal: Dietary intake will improve Outcome: Progressing

## 2023-06-07 NOTE — Progress Notes (Signed)
PHARMACY - ANTICOAGULATION CONSULT NOTE  Pharmacy Consult for heparin Indication: AA thrombus/renal infarct   Allergies  Allergen Reactions   Hctz [Hydrochlorothiazide] Other (See Comments)    "Peeing too much"    Patient Measurements: Height: 5\' 8"  (172.7 cm) Weight:  (Unable to obtain accurate bed weight (currently reading 30.2 kg compared to 57Kg x2 days ago). Pt too weak to stand for standing scale at this time.) IBW/kg (Calculated) : 68.4 Heparin Dosing Weight: 57 kg  Vital Signs: Temp: 99 F (37.2 C) (10/05 2015) Temp Source: Axillary (10/05 2015) BP: 137/73 (10/05 1700) Pulse Rate: 98 (10/05 1700)  Labs: Recent Labs    06/05/23 0310 06/06/23 0840 06/07/23 0226 06/07/23 0741 06/07/23 1033 06/07/23 2126  HGB 11.2* 11.2*  --  11.0*  --   --   HCT 32.0* 31.4*  --  30.9*  --   --   PLT 432* 415*  --  398  --   --   APTT  --   --  68*  --   --   --   HEPARINUNFRC  --   --  0.67  --   --  0.59  CREATININE 6.54* 8.57*  --  9.95* 10.17*  --     Estimated Creatinine Clearance: 4.6 mL/min (A) (by C-G formula based on SCr of 10.17 mg/dL (H)).   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  Patient was transitioned to Apixaban 9/25, however patient has declined and has been unable to take PO meds for several days.  Last  apixaban dose 9/29.  Pharmacy has been consulted to start heparin.  Patient was previously therapeutic on heparin at 700 units/hr.  Heparin level 0.59 is not correlating with aPTT 68 despite holding apixaban x6 days. aPTT is at low end of therapeutic on 700 units/hr.   Goal of Therapy:  Heparin level 0.3-0.7 units/ml aPTT 66-102 seconds Monitor platelets by anticoagulation protocol: Yes   Plan:  Increase heparin to 750 units/hr F/u aPTT until correlates with heparin level  Monitor daily aPTT, heparin level, CBC, signs/symptoms of bleeding    Alphia Moh, PharmD, BCPS, [facility] Clinical Pharmacist  Please check AMION for all [facility] Pharmacy phone numbers After 10:00 PM, call Main Pharmacy [phone]

## 2023-06-07 NOTE — Progress Notes (Signed)
PHARMACY - ANTICOAGULATION CONSULT NOTE  Pharmacy Consult for heparin Indication: AA thrombus/renal infarct   Allergies  Allergen Reactions   Hctz [Hydrochlorothiazide] Other (See Comments)    "Peeing too much"    Patient Measurements: Height: 5\' 8"  (172.7 cm) Weight:  (Unable to obtain accurate bed weight (currently reading 30.2 kg compared to 57Kg x2 days ago). Pt too weak to stand for standing scale at this time.) IBW/kg (Calculated) : 68.4 Heparin Dosing Weight: 57 kg  Vital Signs: Temp: 98.3 F (36.8 C) (10/05 0151) Temp Source: Oral (10/05 0151) BP: 131/61 (10/05 0151) Pulse Rate: 86 (10/05 0151)  Labs: Recent Labs    06/05/23 0310 06/06/23 0840 06/07/23 0226  HGB 11.2* 11.2*  --   HCT 32.0* 31.4*  --   PLT 432* 415*  --   APTT  --   --  68*  HEPARINUNFRC  --   --  0.67  CREATININE 6.54* 8.57*  --     Estimated Creatinine Clearance: 5.4 mL/min (A) (by C-G formula based on SCr of 8.57 mg/dL (H)).   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  Patient was transitioned to Apixaban 9/25, however patient has declined and has been unable to take PO meds for several days.  Last apixaban dose 9/29.  Pharmacy has been consulted to start heparin.  Patient was previously therapeutic on  heparin at 700 units/hr.  Will check aPTT and antiXa level given recent apixaban use and possible accumulation with worsening renal function.  Suspect can monitor with AntiXa levels alone.  10/5 AM update:   aPTT therapeutic  Goal of Therapy:  Heparin level 0.3-0.7 units/ml aPTT 66-102 seconds Monitor platelets by anticoagulation protocol: Yes   Plan:  Cont heparin at 700 units/hr Heparin level and aPTT in 8 hours Daily CBC and heparin level  Abran Duke, PharmD, BCPS Clinical Pharmacist Phone: [phone]

## 2023-06-07 NOTE — Progress Notes (Addendum)
PROGRESS NOTE        PATIENT DETAILS Name: Eric [name] Age: 82 y.o. Sex: male Date of Birth: 08-03-1941 Admit Date: 05/22/2023 Admitting Physician Angie Fava, DO WUJ:[id number], Georgina Quint, MD  Brief Summary:  Patient is a 82 y.o.  male with history of CVA, HTN-who sustained a mechanical fall at home-upon further evaluation-was found to have acute CVA and C6 vertebral body fracture.  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  Acute CVA with right frontal lobe, was confirmed by MRI on 9/20, patient was seen by neurology, and neurosurgery, as well patient was noted to have urinary retention, secondary to BPH, with Foley catheter was placed by urology Dr. Alvester Morin, CT abdomen pelvis with contrast was obtained secondary to abdominal pain, which was significant foronocclusive, pedunculated, partially adherent thrombus in the mid abdominal aorta  which occludes the ostium of the right renal artery, vascular surgery consulted, recommended anticoagulation after patient was cleared by neurology, patient continues to be significantly deconditioned, with dysphagia in the setting of his stroke, with micro aspiration, who has been treated with pneumonia, he developed elevated LFTs, which thought to be secondary to [facility] as he has been treated with Unasyn, he is on meropenem, function continue to worsen, where he is currently anuric, renal has been consulted, patient is not HD candidate, patient with  multiorgan failure, comorbidities, poor baseline, and progressive decline over the last few month, so palliative medicine has been following closely with family regarding goals of care.   Consults: Neurology, urology, Pall care, GI,renal  Subjective:  Patient obtunded, unable to provide any reliable complaints, he did require some haloperidol for restlessness overnight,   Objective: Vitals: Blood pressure (!) 140/63, pulse 84, temperature 97.9 F (36.6 C), temperature source Oral, resp. rate 14, height 5\' 8"  (1.727 m), weight 57.6 kg, SpO2 99%.   Exam:  Patient is obtundent, does not open eye, or follow commands or answer any questions, frail, chronically ill-appearing,  Wearing c-collar  Symmetrical chest wall movement, scattered Rales at the bases, diminished air entry at the bases  Regular rate and rhythm  Abdomen soft, scaphoid  Extremities with no edema, clubbing or cyanosis     Assessment/Plan:  Acute Ischemic Infarct:  Subacute left parietal infarct with possible hemorrhage infarct vs hemorrhagic contusion in right frontal region -Likely due to embolic versus vessel disease -Neurology input greatly appreciated, CTA head and neck significant for 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  Pain regimen adjusted for better pain control.  HTN -Initially allowing  for permissive hypertension, blood pressure currently acceptable, continue with as needed  hydralazine  Hypokalemia  - Replaced.  Acute renal failure -creatinine continues trending up, appears to be anuric, renal input greatly appreciated, multifactorial, with ischemic and prerenal insults, especially he received clinically indicated contrasted studies as well -This appears to be irreversible, rapidly progressive, patient at end-of-life which I have discussed with multiple family members, please see discussion below, I have updated his son at bedside today, rating up to 10.17 today. -Patient with hyperkalemia at 5.5 today, he will receive Kayexalate enema, calcium gluconate, amp sodium bicarb, D50 with IV insulin as well.  Transaminitis-elevated LFTs -Likely related to DILI from Unasyn, Unasyn stopped 9/29 -Avoid penicillins  Aspiration pneumonia  -Patient high risk for aspiration, did not do well with MBS study 06/03/2023  -Patient continues to have worsening pneumonia on imaging, and clinically as he remains febrile, with worsening leukocytosis despite being treated appropriately with meropenem, confirmed by chest x-ray 10/2 which showed worsening pneumonia . -He remains n.p.o. as high risk for aspiration  -Patient is not safe to swallow secondary to aspiration, and encephalopathy, discussed with son on 10/3, son was asking about nutrition including TPN, I told him that TPN is not indicated here given patient has no bowel obstruction, as well he is having transaminitis, I have discussed with him about cortrak tube feed, he would like to hold on that for now  HLD   -Crestor when able to swallow  BPH  - Proscar but developed urinary retention, urology was called Foley catheter was placed by Dr. Alvester Morin on 05/25/2023.   Abdominal aortic thrombus, with right renal artery occlusion  -Vascular surgery input greatly appreciated, plan for repeat CTA once stable, but unfortunately renal function does not allow for any IV contrast . -Continue with full anticoagulation .  He remains on IV  heparin.  Patchy increased sclerosis within the T12 vertebral body, nonspecific.  - Outpatient further evaluation with nonemergent MRI of the thoracic spine.  Hypokalemia.  Replace.  Acute encephalopathy -Multifactorial, in the setting of CVA presentation, infection, uremia with worsening renal failure.  As well hospital delirium  Goals of care -With multiple comorbidities, cachectic, with failure to thrive, poor oral intake, high risk for aspiration, acute CVA, with worsening renal failure, anuric, not HD candidate, liver injury, encephalopathic, patient with progressive decline for a few month prior to hospitalization, I have discussed with multiple family members, have discussed multiple family members over the last couple days, I have informed them patient is with multiorgan failure, he is nearing end-of-life, with a irreversible decline, I have recommended DNR, comfort care, he is unlikely to survive his multiorgan failure, palliative medicine is assisting with goals of care discussion, they will try to arrange family meeting over the weekend.  Have updated son again today, I have told him that father is nearing death, I have recommended again full comfort measures, and DNR CODE STATUS, plan to meet with palliative medicine this afternoon, where they will discuss this further with the family.  Underweight: Estimated body mass index is 19.31 kg/m as calculated from the following:   Height as of this encounter: 5\' 8"  (1.727 m).   Weight as of this encounter: 57.6 kg.   Code status:   Code Status: Full Code   DVT Prophylaxis: SCDs Start: 05/22/23 2315   Family Communication: I have discussed with son Jerilynn Som on 10/2, and 10/3 AM, I have discussed with son [name], and Stephanie Coup this morning, and discussed with his  daughter [name] at bedside 10/4, discussed with Jerilynn Som again today.     Disposition Plan: Status is: Inpatient Remains inpatient appropriate because: Severity of  illness   Planned Discharge Destination:Home health   Diet: Diet Order             Diet NPO time specified  Diet effective now                   MEDICATIONS: Scheduled Meds:  carvedilol  6.25 mg Oral BID WC   Chlorhexidine Gluconate Cloth  6 each Topical Daily   finasteride  5 mg Oral Daily   mirtazapine  15 mg Oral QHS   tamsulosin  0.4 mg Oral Daily   Continuous Infusions:  heparin 700 Units/hr (06/07/23 1323)     PRN Meds:.acetaminophen **OR** acetaminophen, fentaNYL (SUBLIMAZE) injection, haloperidol lactate, hydrALAZINE, labetalol, melatonin, naLOXone (NARCAN)  injection, ondansetron (ZOFRAN) IV, polyethylene glycol   I have personally reviewed following labs and imaging studies  LABORATORY DATA:  Recent Labs  Lab 06/01/23 1145 06/02/23 0319 06/03/23 0315 06/04/23 0313 06/05/23 0310 06/06/23 0840 06/07/23 0741  WBC 11.0* 10.4 13.1* 23.4* 40.5* 35.8* 23.6*  HGB 11.9* 13.2 12.6* 11.7* 11.2* 11.2* 11.0*  HCT 34.8* 38.0* 36.7* 33.5* 32.0* 31.4* 30.9*  PLT 323 363 401* 420* 432* 415* 398  MCV 78.4* 80.0 77.3* 77.2* 78.4* 77.7* 74.8*  MCH 26.8 27.8 26.5 27.0 27.5 27.7 26.6  MCHC 34.2 34.7 34.3 34.9 35.0 35.7 35.6  RDW 15.0 14.7 14.7 14.6 14.8 14.6 14.3  LYMPHSABS 0.9 0.5* 0.6* 1.2 1.2  --   --   MONOABS 1.2* 0.7 1.0 0.9 1.6*  --   --   EOSABS 0.0 0.0 0.0 0.0 0.0  --   --   BASOSABS 0.0 0.0 0.0 0.0 0.0  --   --     Recent Labs  Lab 06/01/23 1145 06/01/23 1146 06/01/23 1957 06/02/23 0319 06/02/23 1352 06/03/23 0315 06/04/23 0313 06/05/23 0310 06/06/23 0840 06/07/23 0741 06/07/23 1033  NA 139  --   --  140  --  142 144 143 144 143 144  K 3.6  --   --  3.7  --  4.0 4.5 4.8 5.1 5.5* 5.5*  CL 102  --   --  106  --  107 109 107 109 108 108  CO2 23  --   --  21*  --  20* 21* 20* 20* 18* 17*  ANIONGAP 14  --   --  13  --  15 14 16* 15 17* 19*  GLUCOSE 123*  --   --  96  --  146* 183* 91 88 76 179*  BUN 21  --   --  23  --  36* 55* 73* 104* 128*  132*  CREATININE 2.19*  --   --  2.47*  --  3.26* 4.88* 6.54* 8.57* 9.95* 10.17*  AST 414*  --   --  521*  --  349* 239* 431*  --   --   --   ALT 434*  --   --  518*  --  513* 370* 360*  --   --   --   ALKPHOS 102  --   --  135*  --  148* 149* 164*  --   --   --   BILITOT 0.8  --   --  0.9  --  1.1 1.0 1.2  --   --   --   ALBUMIN 1.9*  --   --  2.0*  --  2.0* 1.7* <1.5*  --   --   --   CRP 24.8*  --   --  24.9*  --  31.8* 36.2*  --   --   --   --   PROCALCITON 0.63  --   --  0.64  --  3.19 6.25 7.21  --   --   --   INR  --   --   --   --  1.9* 2.0*  --   --   --   --   --   AMMONIA  --   --   --   --  17  --   --   --   --   --   --   BNP  --  37.4 40.8 30.5  --  50.2 56.5  --   --   --   --   MG 2.2  --   --  2.1  --  2.2 2.4  --   --   --   --   CALCIUM 8.6*  --   --  8.7*  --  8.8* 8.5* 8.5* 8.7* 8.5* 8.5*    Lab Results  Component Value Date   CHOL 116 05/23/2023   HDL 65 05/23/2023   LDLCALC 39 05/23/2023   LDLDIRECT 163.3 05/26/2008   TRIG 59 05/23/2023   CHOLHDL 1.8 05/23/2023   Lab Results  Component Value Date   HGBA1C 6.3 (H) 05/26/2023      Recent Labs  Lab 06/01/23 1145 06/01/23 1146 06/01/23 1957 06/02/23 0319 06/02/23 1352 06/03/23 0315 06/04/23 0313 06/05/23 0310 06/06/23 0840 06/07/23 0741 06/07/23 1033  CRP 24.8*  --   --  24.9*  --  31.8* 36.2*  --   --   --   --   PROCALCITON 0.63  --   --  0.64  --  3.19 6.25 7.21  --   --   --   INR  --   --   --   --  1.9* 2.0*  --   --   --   --   --   AMMONIA  --   --   --   --  17  --   --   --   --   --   --   BNP  --  37.4 40.8 30.5  --  50.2 56.5  --   --   --   --   MG 2.2  --   --  2.1  --  2.2 2.4  --   --   --   --   CALCIUM 8.6*  --   --  8.7*  --  8.8* 8.5* 8.5* 8.7* 8.5* 8.5*    RADIOLOGY STUDIES/RESULTS: No results found.   LOS: 16 days   Signature  -    Huey Bienenstock M.D on 06/07/2023 at 2:34 PM   -  To page go to [email or web address]

## 2023-06-07 NOTE — Progress Notes (Deleted)
PHARMACY - ANTICOAGULATION CONSULT NOTE  Pharmacy Consult for heparin Indication: AA thrombus/renal infarct   Allergies  Allergen Reactions   Hctz [Hydrochlorothiazide] Other (See Comments)    "Peeing too much"    Patient Measurements: Height: 5\' 8"  (172.7 cm) Weight:  (Unable to obtain accurate bed weight (currently reading 30.2 kg compared to 57Kg x2 days ago). Pt too weak to stand for standing scale at this time.) IBW/kg (Calculated) : 68.4 Heparin Dosing Weight: 57 kg  Vital Signs: Temp: 99 F (37.2 C) (10/05 2015) Temp Source: Axillary (10/05 2015) BP: 137/73 (10/05 1700) Pulse Rate: 98 (10/05 1700)  Labs: Recent Labs    06/05/23 0310 06/06/23 0840 06/07/23 0226 06/07/23 0741 06/07/23 1033 06/07/23 2126  HGB 11.2* 11.2*  --  11.0*  --   --   HCT 32.0* 31.4*  --  30.9*  --   --   PLT 432* 415*  --  398  --   --   APTT  --   --  68*  --   --  68*  HEPARINUNFRC  --   --  0.67  --   --  0.59  CREATININE 6.54* 8.57*  --  9.95* 10.17*  --     Estimated Creatinine Clearance: 4.6 mL/min (A) (by C-G formula based on SCr of 10.17 mg/dL (H)).   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  Patient was transitioned to Apixaban 9/25, however patient has declined and has been unable to take PO meds for several days.  Last apixaban dose  9/29.  Pharmacy has been consulted to start heparin.  Patient was previously therapeutic on heparin at 700 units/hr.  Will check aPTT and antiXa level given recent apixaban use and possible accumulation with worsening renal function.  Suspect can monitor with AntiXa levels alone.  aPTT is therapeutic at 68 sec, heparin level is therapeutic at 0.59. No bleeding noted.  Goal of Therapy:  Heparin level 0.3-0.7 units/ml aPTT 66-102 seconds Monitor platelets by anticoagulation protocol: Yes   Plan:  Continue heparin drip at 700 units/hr Daily CBC and heparin level Monitor for s/sx of bleeding  Thank you for involving pharmacy in this patient's care.  Loura Back, PharmD, BCPS Clinical Pharmacist Clinical phone for 06/07/2023 is [phone] 06/07/2023 10:06 PM

## 2023-06-07 NOTE — Progress Notes (Signed)
Per Elgergawy, MD okay to d/c NIH stroke scale assessment.   Per MD and pharmacist okay to hold hep gtt to run calcium gluconate IVPB

## 2023-06-08 DIAGNOSIS — N179 Acute kidney failure, unspecified: Secondary | ICD-10-CM | POA: Diagnosis not present

## 2023-06-08 DIAGNOSIS — I639 Cerebral infarction, unspecified: Secondary | ICD-10-CM | POA: Diagnosis not present

## 2023-06-08 DIAGNOSIS — S12501D Unspecified nondisplaced fracture of sixth cervical vertebra, subsequent encounter for fracture with routine healing: Secondary | ICD-10-CM | POA: Diagnosis not present

## 2023-06-08 DIAGNOSIS — Z7189 Other specified counseling: Secondary | ICD-10-CM | POA: Diagnosis not present

## 2023-06-08 DIAGNOSIS — G9340 Encephalopathy, unspecified: Secondary | ICD-10-CM | POA: Diagnosis not present

## 2023-06-08 DIAGNOSIS — Z515 Encounter for palliative care: Secondary | ICD-10-CM | POA: Diagnosis not present

## 2023-06-08 LAB — CBC
HCT: 33.9 % — ABNORMAL LOW (ref 39.0–52.0)
Hemoglobin: 12.1 g/dL — ABNORMAL LOW (ref 13.0–17.0)
MCH: 27.1 pg (ref 26.0–34.0)
MCHC: 35.7 g/dL (ref 30.0–36.0)
MCV: 75.8 fL — ABNORMAL LOW (ref 80.0–100.0)
Platelets: 407 10*3/uL — ABNORMAL HIGH (ref 150–400)
RBC: 4.47 MIL/uL (ref 4.22–5.81)
RDW: 14.1 % (ref 11.5–15.5)
WBC: 20.9 10*3/uL — ABNORMAL HIGH (ref 4.0–10.5)
nRBC: 0 % (ref 0.0–0.2)

## 2023-06-08 LAB — COMPREHENSIVE METABOLIC PANEL
ALT: 275 U/L — ABNORMAL HIGH (ref 0–44)
AST: 626 U/L — ABNORMAL HIGH (ref 15–41)
Albumin: <1.5 g/dL — ABNORMAL LOW (ref 3.5–5.0)
Alkaline Phosphatase: 221 U/L — ABNORMAL HIGH (ref 38–126)
Anion gap: 19 — ABNORMAL HIGH (ref 5–15)
BUN: 143 mg/dL — ABNORMAL HIGH (ref 8–23)
CO2: 20 mmol/L — ABNORMAL LOW (ref 22–32)
Calcium: 9 mg/dL (ref 8.9–10.3)
Chloride: 108 mmol/L (ref 98–111)
Creatinine, Ser: 11.54 mg/dL — ABNORMAL HIGH (ref 0.61–1.24)
GFR, Estimated: 4 mL/min — ABNORMAL LOW (ref >60)
Glucose, Bld: 98 mg/dL (ref 70–99)
Potassium: 5.1 mmol/L (ref 3.5–5.1)
Sodium: 147 mmol/L — ABNORMAL HIGH (ref 135–145)
Total Bilirubin: 1.5 mg/dL — ABNORMAL HIGH (ref 0.3–1.2)
Total Protein: 6 g/dL — ABNORMAL LOW (ref 6.5–8.1)

## 2023-06-08 LAB — APTT: aPTT: 85 s — ABNORMAL HIGH (ref 24–36)

## 2023-06-08 LAB — HEPARIN LEVEL (UNFRACTIONATED): Heparin Unfractionated: 0.62 [IU]/mL (ref 0.30–0.70)

## 2023-06-08 MED ORDER — CARBOXYMETHYLCELLULOSE SODIUM 0.5 % OP SOLN: 1.0000 [drp] | Freq: Four times a day (QID) | OPHTHALMIC | Status: DC | PRN

## 2023-06-08 MED ORDER — LORAZEPAM 2 MG/ML IJ SOLN
1.0000 mg | INTRAMUSCULAR | Status: DC | PRN
  Administered 2023-06-08: 1 mg via INTRAVENOUS
  Filled 2023-06-08: qty 1

## 2023-06-08 MED ORDER — CALCIUM GLUCONATE-NACL 1-0.675 GM/50ML-% IV SOLN
1.0000 g | Freq: Once | INTRAVENOUS | Status: AC
  Administered 2023-06-08: 1000 mg via INTRAVENOUS
  Filled 2023-06-08: qty 50

## 2023-06-08 MED ORDER — INSULIN ASPART 100 UNIT/ML IV SOLN
5.0000 [IU] | Freq: Once | INTRAVENOUS | Status: AC
  Administered 2023-06-08: 5 [IU] via INTRAVENOUS

## 2023-06-08 MED ORDER — SODIUM POLYSTYRENE SULFONATE 15 GM/60ML PO SUSP
30.0000 g | Freq: Once | ORAL | Status: AC
  Administered 2023-06-08: 30 g via RECTAL
  Filled 2023-06-08: qty 120

## 2023-06-08 MED ORDER — DEXTROSE 50 % IV SOLN
1.0000 | Freq: Once | INTRAVENOUS | Status: AC
  Administered 2023-06-08: 50 mL via INTRAVENOUS
  Filled 2023-06-08: qty 50

## 2023-06-08 NOTE — Plan of Care (Signed)
  Problem: Safety: Goal: Ability to remain free from injury will improve Outcome: Progressing   

## 2023-06-08 NOTE — Progress Notes (Signed)
PHARMACY - ANTICOAGULATION CONSULT NOTE  Pharmacy Consult for heparin Indication: AA thrombus/renal infarct   Allergies  Allergen Reactions   Hctz [Hydrochlorothiazide] Other (See Comments)    "Peeing too much"    Patient Measurements: Height: 5\' 8"  (172.7 cm) Weight:  (Unable to obtain accurate bed weight (currently reading 30.2 kg compared to 57Kg x2 days ago). Pt too weak to stand for standing scale at this time.) IBW/kg (Calculated) : 68.4 Heparin Dosing Weight: 57 kg  Vital Signs: Temp: 97.9 F (36.6 C) (10/06 0400) Temp Source: Axillary (10/06 0400) BP: 139/61 (10/06 0400) Pulse Rate: 105 (10/06 0400)  Labs: Recent Labs    06/06/23 0840 06/07/23 0226 06/07/23 0741 06/07/23 1033 06/07/23 2126 06/08/23 0444  HGB 11.2*  --  11.0*  --   --  12.1*  HCT 31.4*  --  30.9*  --   --  33.9*  PLT 415*  --  398  --   --  407*  APTT  --  68*  --   --  68* 85*  HEPARINUNFRC  --  0.67  --   --  0.59 0.62  CREATININE 8.57*  --  9.95* 10.17*  --   --     Estimated Creatinine Clearance: 4.6 mL/min (A) (by C-G formula based on SCr of 10.17 mg/dL (H)).   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  Patient was transitioned to Apixaban 9/25, however patient has declined and has been unable to take  PO meds for several days.  Last apixaban dose 9/29.  Pharmacy has been consulted to start heparin.  Heparin level (0.62) and aPTT (85) are therapeutic and correlating. Will discontinue aPTT checks. No issues with infusion or s/sx of bleeding per RN.  Of note, patient only has 1 IV line, so heparin has been held twice for IV calcium infusions for hyperkalemia per MD request - held today for 25 minutes. Previous levels have accounted for this interruption in therapy. Ensure levels continue to be adjusted for any future interruptions.  Goal of Therapy:  Heparin level 0.3-0.7 units/ml Monitor platelets by anticoagulation protocol: Yes   Plan:  Continue heparin at 750 units/hr Monitor daily heparin level, CBC, signs/symptoms of bleeding   Nicole Kindred, PharmD PGY1 Pharmacy Resident 06/08/2023 9:21 AM

## 2023-06-08 NOTE — Plan of Care (Signed)
  Problem: Pain Managment: Goal: General experience of comfort will improve Outcome: Progressing   

## 2023-06-08 NOTE — Progress Notes (Signed)
PROGRESS NOTE        PATIENT DETAILS Name: Eric [name] Age: 82 y.o. Sex: male Date of Birth: [birth year] Admit Date: 05/22/2023 Admitting Physician Angie Fava, DO MWU:[id number], Georgina Quint, MD  Brief Summary:  Patient is a 82 y.o.  male with history of CVA, HTN-who sustained a mechanical fall at home-upon further evaluation-was found to have acute CVA and C6 vertebral body fracture.  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  Acute CVA with right frontal lobe, was confirmed by MRI on 9/20, patient was seen by neurology, and neurosurgery, as well patient was noted to have urinary retention, secondary to BPH, with Foley catheter was placed by urology Dr. Alvester Morin, CT abdomen pelvis with contrast was obtained secondary to abdominal pain, which was significant foronocclusive, pedunculated, partially adherent thrombus in the mid abdominal aorta  which occludes the ostium of the right renal artery, vascular surgery consulted, recommended anticoagulation after patient was cleared by neurology, patient continues to be significantly deconditioned, with dysphagia in the setting of his stroke, with micro aspiration, who has been treated with pneumonia, he developed elevated LFTs, which thought to be secondary to [facility] as he has been treated with Unasyn, he is on meropenem, function continue to worsen, where he is currently anuric, renal has been consulted, patient is not HD candidate, patient with  multiorgan failure, comorbidities, poor baseline, and progressive decline over the last few month, so palliative medicine has been following closely with family regarding goals of care.   Consults: Neurology, urology, Pall care, GI,renal  Subjective:  Patient obtundent unable to provide reliable complaints, but discussed with daughter at bedside, he is with episodes of restlessness, confusion, as well he had some pain, pain has improved with fentanyl, and dizziness has responded well to haloperidol.    Objective: Vitals: Blood pressure 121/68, pulse 95, temperature 97.7 F (36.5 C), temperature source Axillary, resp. rate 16, height 5\' 8"  (1.727 m), weight 57.6 kg, SpO2 100%.   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  Pain  regimen adjusted for better pain control.  HTN -Initially allowing for permissive  hypertension, blood pressure currently acceptable, continue with as needed hydralazine  Hypokalemia  - Replaced.  Acute renal failure -creatinine continues trending up, appears to be anuric, renal input greatly appreciated, multifactorial, with ischemic and prerenal insults, especially he received clinically indicated contrasted studies as well -This appears to be irreversible, rapidly progressive, patient at end-of-life which I have discussed with multiple family members, multiple times   Hyperkalemia -Potassium peaked at 5.5, but this has responded to 50 with IV insulin, and calcium gluconate, but will keep continue to happen, given worsening renal failure.   Transaminitis-elevated LFTs -Likely related to DILI from Unasyn, Unasyn stopped 9/29 -Avoid penicillins  Aspiration pneumonia  -Patient high risk for aspiration, did not do well with MBS study 06/03/2023  -Patient continues to have worsening pneumonia on imaging, and clinically as he remains febrile, with worsening leukocytosis despite being treated appropriately with meropenem, confirmed by chest x-ray 10/2 which showed worsening pneumonia . -He remains n.p.o. as high risk for aspiration  -Patient is not safe to swallow secondary to aspiration, and encephalopathy, discussed with son on 10/3, son was asking about nutrition including TPN, I told him that TPN is not indicated here given patient has no bowel obstruction, as well he is having transaminitis, I have discussed with him about cortrak tube feed, he would like to hold on that for now I have discussed with his daughter again today, they would like to hold on it for now, and they will discuss among themselves.  HLD   -Holding Crestor due to elevated LFTs.  BPH  - Proscar but developed urinary retention, urology was called Foley catheter was placed by Dr. Alvester Morin on 05/25/2023.   Abdominal aortic thrombus, with right renal artery occlusion  -Vascular surgery input greatly  appreciated, plan for repeat CTA once stable, but unfortunately renal function does not allow for any IV contrast . -Continue with full anticoagulation .  He remains on IV heparin.  Patchy increased sclerosis within the T12 vertebral body, nonspecific.  - Outpatient further evaluation with nonemergent MRI of the thoracic spine.  Hypokalemia.  Replace.  Acute encephalopathy -Multifactorial, in the setting of CVA presentation, infection, uremia with worsening renal failure.  As well hospital delirium  Goals of care -With multiple comorbidities, cachectic, with failure to thrive, poor oral intake, high risk for aspiration, acute CVA, with worsening renal failure, anuric, not HD candidate, liver injury, encephalopathic, patient with progressive decline for a few month prior to hospitalization, I have discussed with multiple family members, have discussed multiple family members over the last couple days, I have informed them patient is with multiorgan failure, he is nearing end-of-life, with a irreversible decline, I have recommended DNR, comfort care, he is unlikely to survive his multiorgan failure, palliative medicine is assisting with goals of care discussion, I have recommended full comfort measures, DNR to the family, and this has been felt again by other subspecialist, as currently patient he is actively dying, at end-of-life, palliative medicine has been following closely, patient has been made to DNR, but will continue with full scope of treatment currently    Underweight: Estimated body mass index is 19.31 kg/m as calculated from the following:   Height as of this encounter: 5\' 8"  (1.727 m).   Weight as of this encounter: 57.6 kg.   Code status:   Code Status: Do not attempt resuscitation (DNR) PRE-ARREST INTERVENTIONS DESIRED   DVT Prophylaxis: SCDs Start: 05/22/23  2315   Family Communication: I have discussed with son Jerilynn Som on 10/2, and 10/3 AM, I have discussed with son [name],  and Stephanie Coup this morning, and discussed with his daughter [name] at bedside 10/4, discussed with Jerilynn Som again today.     Disposition Plan: Status is: Inpatient Remains inpatient appropriate because: Severity of illness   Planned Discharge Destination:Home health   Diet: Diet Order             Diet NPO time specified  Diet effective now                   MEDICATIONS: Scheduled Meds:  carvedilol  6.25 mg Oral BID WC   Chlorhexidine Gluconate Cloth  6 each Topical Daily   finasteride  5 mg Oral Daily   mirtazapine  15 mg Oral QHS   tamsulosin  0.4 mg Oral Daily   Continuous Infusions:  heparin 750 Units/hr (06/08/23 0850)     PRN Meds:.acetaminophen **OR** acetaminophen, fentaNYL (SUBLIMAZE) injection, haloperidol lactate, hydrALAZINE, labetalol, melatonin, naLOXone (NARCAN)  injection, ondansetron (ZOFRAN) IV, polyethylene glycol   I have personally reviewed following labs and imaging studies  LABORATORY DATA:  Recent Labs  Lab 06/02/23 0319 06/03/23 0315 06/04/23 0313 06/05/23 0310 06/06/23 0840 06/07/23 0741 06/08/23 0444  WBC 10.4 13.1* 23.4* 40.5* 35.8* 23.6* 20.9*  HGB 13.2 12.6* 11.7* 11.2* 11.2* 11.0* 12.1*  HCT 38.0* 36.7* 33.5* 32.0* 31.4* 30.9* 33.9*  PLT 363 401* 420* 432* 415* 398 407*  MCV 80.0 77.3* 77.2* 78.4* 77.7* 74.8* 75.8*  MCH 27.8 26.5 27.0 27.5 27.7 26.6 27.1  MCHC 34.7 34.3 34.9 35.0 35.7 35.6 35.7  RDW 14.7 14.7 14.6 14.8 14.6 14.3 14.1  LYMPHSABS 0.5* 0.6* 1.2 1.2  --   --   --   MONOABS 0.7 1.0 0.9 1.6*  --   --   --   EOSABS 0.0 0.0 0.0 0.0  --   --   --   BASOSABS 0.0 0.0 0.0 0.0  --   --   --     Recent Labs  Lab 06/01/23 1957 06/02/23 0319 06/02/23 0319 06/02/23 1352 06/03/23 0315 06/04/23 0313 06/05/23 0310 06/06/23 0840 06/07/23 0741 06/07/23 1033 06/07/23 1726 06/08/23 1105  NA  --  140   < >  --  142 144 143 144 143 144  --  147*  K  --  3.7   < >  --  4.0 4.5 4.8 5.1 5.5* 5.5* 5.4* 5.1  CL  --   106   < >  --  107 109 107 109 108 108  --  108  CO2  --  21*   < >  --  20* 21* 20* 20* 18* 17*  --  20*  ANIONGAP  --  13   < >  --  15 14 16* 15 17* 19*  --  19*  GLUCOSE  --  96   < >  --  146* 183* 91 88 76 179*  --  98  BUN  --  23   < >  --  36* 55* 73* 104* 128* 132*  --  143*  CREATININE  --  2.47*   < >  --  3.26* 4.88* 6.54* 8.57* 9.95* 10.17*  --  11.54*  AST  --  521*  --   --  349* 239* 431*  --   --   --   --  626*  ALT  --  518*  --   --  513* 370* 360*  --   --   --   --  275*  ALKPHOS  --  135*  --   --  148* 149* 164*  --   --   --   --  221*  BILITOT  --  0.9  --   --  1.1 1.0 1.2  --   --   --   --  1.5*  ALBUMIN  --  2.0*  --   --  2.0* 1.7* <1.5*  --   --   --   --  <1.5*  CRP  --  24.9*  --   --  31.8* 36.2*  --   --   --   --   --   --   PROCALCITON  --  0.64  --   --  3.19 6.25 7.21  --   --   --   --   --   INR  --   --   --  1.9* 2.0*  --   --   --   --   --   --   --   AMMONIA  --   --   --  17  --   --   --   --   --   --   --   --   BNP 40.8 30.5  --   --  50.2 56.5  --   --   --   --   --   --   MG  --  2.1  --   --  2.2 2.4  --   --   --   --   --   --   CALCIUM  --  8.7*   < >  --  8.8* 8.5* 8.5* 8.7* 8.5* 8.5*  --  9.0   < > = values in this interval not displayed.    Lab Results  Component Value Date   CHOL 116 05/23/2023   HDL 65 05/23/2023   LDLCALC 39 05/23/2023   LDLDIRECT 163.3 05/26/2008   TRIG 59 05/23/2023   CHOLHDL 1.8 05/23/2023   Lab Results  Component Value Date   HGBA1C 6.3 (H) 05/26/2023      Recent Labs  Lab 06/01/23 1957 06/02/23 0319 06/02/23 0319 06/02/23 1352 06/03/23 0315 06/04/23 0313 06/05/23 0310 06/06/23 0840 06/07/23 0741 06/07/23 1033 06/08/23 1105  CRP  --  24.9*  --   --  31.8* 36.2*  --   --   --   --   --   PROCALCITON  --  0.64  --   --  3.19 6.25 7.21  --   --   --   --   INR  --   --   --  1.9* 2.0*  --   --   --   --   --   --   AMMONIA  --   --   --  17  --   --   --   --   --   --   --   BNP  40.8 30.5  --   --  50.2 56.5  --   --   --   --   --   MG  --  2.1  --   --  2.2 2.4  --   --   --   --   --   CALCIUM  --  8.7*   < >  --  8.8* 8.5* 8.5* 8.7* 8.5* 8.5* 9.0   < > = values in this interval not displayed.    RADIOLOGY STUDIES/RESULTS: No results found.   LOS: 17 days   Signature  -    Huey Bienenstock M.D on 06/08/2023 at 1:10 PM   -  To page go to [email or web address]

## 2023-06-08 NOTE — Progress Notes (Signed)
Daily Progress Note   Patient Name: Eric [name]       Date: 06/08/2023 DOB: [birth year]  Age: 82 y.o. MRN#: [id number] Attending Physician: Starleen Arms, MD Primary Care Physician: Tresa Garter, MD Admit Date: 05/22/2023  Reason for Consultation/Follow-up: Establishing goals of care  Length of Stay: 17  Current Medications: Scheduled Meds:   carvedilol  6.25 mg Oral BID WC   Chlorhexidine Gluconate Cloth  6 each Topical Daily   insulin aspart  5 Units Intravenous Once   And   dextrose  1 ampule Intravenous Once   finasteride  5 mg Oral Daily   mirtazapine  15 mg Oral QHS   sodium polystyrene  30 g Rectal Once   tamsulosin  0.4 mg Oral Daily    Continuous Infusions:  calcium gluconate     heparin 750 Units/hr (06/08/23 0640)    PRN Meds: acetaminophen **OR** acetaminophen, fentaNYL (SUBLIMAZE) injection, haloperidol lactate, hydrALAZINE, labetalol, melatonin, naLOXone (NARCAN)  injection, ondansetron (ZOFRAN) IV, polyethylene glycol  Physical Exam Vitals reviewed.  Constitutional:      General: He is sleeping.     Appearance: He is cachectic. He is ill-appearing.     Interventions: Nasal cannula in place.     Comments: Soft c-collar  Cardiovascular:     Rate and Rhythm: Tachycardia present.  Pulmonary:     Effort: Pulmonary effort is normal. Tachypnea present.  Neurological:     Mental Status: He is confused.             Vital Signs: BP 139/61 (BP Location: Left Arm)   Pulse (!) 105   Temp 97.9 F (36.6 C) (Axillary)   Resp 15   Ht 5\' 8"  (1.727 m)   Wt 57.6 kg   SpO2 95%   BMI 19.31 kg/m  SpO2: SpO2: 95 % O2 Device: O2 Device: Room Air O2 Flow Rate: O2 Flow Rate (L/min): 2 L/min        Palliative Assessment/Data: 10%  NPO      Patient Active Problem List   Diagnosis Date Noted   Elevated liver enzymes 06/02/2023   Drug-induced liver injury 06/02/2023   Acute encephalopathy 05/23/2023   C6 cervical fracture (HCC) 05/23/2023   Elevated troponin 05/23/2023   BPH (benign prostatic hyperplasia) 05/23/2023   Acute ischemic stroke (HCC) 05/22/2023  Confusion 03/13/2023   Abnormal CXR 02/23/2023   Subarachnoid bleed (HCC) 02/22/2023   CVA (cerebral vascular accident) (HCC) 02/22/2023   Anemia 01/02/2023   Restless legs syndrome (RLS) 04/24/2021   Abnormal ultrasound of bladder 12/25/2020   UTI (urinary tract infection) 09/25/2020   Cervical pain (neck) 05/15/2018   Abdominal bloating 05/15/2018   Arm pain, diffuse, left 12/31/2017   Well adult exam 07/30/2017   Vitamin D deficiency 12/18/2016   Osteoarthritis 12/18/2016   B12 deficiency 09/16/2016   Hypokalemia 08/16/2016   Dizziness and giddiness 11/15/2015   Blurred vision 11/15/2015   Headache 07/07/2013   Hyperlipemia 07/07/2013   Acute intracranial hemorrhage (HCC) 07/03/2013   Pain of both eyes 07/02/2013   Acute bronchitis 07/02/2013   ERECTILE DYSFUNCTION, 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 male with past medical history of stroke, essential pretension, who was admitted to [facility] on 05/22/2023 with suspected acute versus subacute ischemic stroke after presenting from home to [facility] ED for evaluation of fall.   Today's Discussion: Patient was sleeping in bed with mitts on moving his arms like he is folding something. His daughter reports he was agitated overnight trying to pull at his oxygen and foley. He received a dose of haldol but remained agitated. He also received fentanyl for pain several times overnight. We discussed how his symptoms are worse at night and he may  have his day and nights mixed up since he slept most of yesterday.  Daughter has no questions or concerns about goals of care meeting yesterday. Discussed the importance of continued conversation with family and the medical providers regarding overall plan of care and treatment options, ensuring decisions are within the context of the patient's values and GOCs. Encouraged family to call PMT with questions or concerns.  Recommendations/Plan: DNR Full scope Encouraged continued discussion around goals of care/ scope of care Continued PMT support.   Code Status:    Code Status Orders  (From admission, onward)           Start     Ordered   05/22/23 2315  Full code  Continuous       Question:  By:  Answer:  Consent: discussion documented in EHR   05/22/23 2314           Extensive chart review has been completed prior to seeing the patient and speaking with his daughter including labs, vital signs, imagine, progress/consult notes, orders, medications, and available advance directive documents.  Care plan was discussed with Dr. Randol Kern and bedside RN  Time spent: 25 minutes   Thank you for allowing the Palliative Medicine Team to assist in the care of this patient.     Sherryll Burger, NP  Please contact Palliative Medicine Team phone at [phone] for questions and concerns.

## 2023-06-09 DIAGNOSIS — N179 Acute kidney failure, unspecified: Secondary | ICD-10-CM | POA: Diagnosis not present

## 2023-06-09 DIAGNOSIS — S12501D Unspecified nondisplaced fracture of sixth cervical vertebra, subsequent encounter for fracture with routine healing: Secondary | ICD-10-CM | POA: Diagnosis not present

## 2023-06-09 DIAGNOSIS — I639 Cerebral infarction, unspecified: Secondary | ICD-10-CM | POA: Diagnosis not present

## 2023-06-09 DIAGNOSIS — G9340 Encephalopathy, unspecified: Secondary | ICD-10-CM | POA: Diagnosis not present

## 2023-06-09 LAB — CBC
HCT: 32.2 % — ABNORMAL LOW (ref 39.0–52.0)
Hemoglobin: 11.3 g/dL — ABNORMAL LOW (ref 13.0–17.0)
MCH: 26.2 pg (ref 26.0–34.0)
MCHC: 35.1 g/dL (ref 30.0–36.0)
MCV: 74.7 fL — ABNORMAL LOW (ref 80.0–100.0)
Platelets: 394 10*3/uL (ref 150–400)
RBC: 4.31 MIL/uL (ref 4.22–5.81)
RDW: 14.3 % (ref 11.5–15.5)
WBC: 21.6 10*3/uL — ABNORMAL HIGH (ref 4.0–10.5)
nRBC: 0 % (ref 0.0–0.2)

## 2023-06-09 LAB — HEPARIN LEVEL (UNFRACTIONATED)
Heparin Unfractionated: 0.72 [IU]/mL — ABNORMAL HIGH (ref 0.30–0.70)
Heparin Unfractionated: 0.81 [IU]/mL — ABNORMAL HIGH (ref 0.30–0.70)

## 2023-06-09 LAB — BASIC METABOLIC PANEL
Anion gap: 18 — ABNORMAL HIGH (ref 5–15)
BUN: 161 mg/dL — ABNORMAL HIGH (ref 8–23)
CO2: 21 mmol/L — ABNORMAL LOW (ref 22–32)
Calcium: 9 mg/dL (ref 8.9–10.3)
Chloride: 109 mmol/L (ref 98–111)
Creatinine, Ser: 12.79 mg/dL — ABNORMAL HIGH (ref 0.61–1.24)
GFR, Estimated: 4 mL/min — ABNORMAL LOW (ref >60)
Glucose, Bld: 96 mg/dL (ref 70–99)
Potassium: 5.6 mmol/L — ABNORMAL HIGH (ref 3.5–5.1)
Sodium: 148 mmol/L — ABNORMAL HIGH (ref 135–145)

## 2023-06-09 LAB — GLUCOSE, CAPILLARY: Glucose-Capillary: 148 mg/dL — ABNORMAL HIGH (ref 70–99)

## 2023-06-09 MED ORDER — SODIUM BICARBONATE 8.4 % IV SOLN
50.0000 meq | Freq: Once | INTRAVENOUS | Status: AC
  Administered 2023-06-09: 50 meq via INTRAVENOUS
  Filled 2023-06-09: qty 50

## 2023-06-09 MED ORDER — DEXTROSE 50 % IV SOLN
1.0000 | Freq: Once | INTRAVENOUS | Status: AC
  Administered 2023-06-09: 50 mL via INTRAVENOUS
  Filled 2023-06-09: qty 50

## 2023-06-09 MED ORDER — INSULIN ASPART 100 UNIT/ML IV SOLN
5.0000 [IU] | Freq: Once | INTRAVENOUS | Status: AC
  Administered 2023-06-09: 5 [IU] via INTRAVENOUS

## 2023-06-09 MED ORDER — SODIUM POLYSTYRENE SULFONATE 15 GM/60ML PO SUSP
30.0000 g | Freq: Once | ORAL | Status: AC
  Administered 2023-06-09: 30 g via RECTAL
  Filled 2023-06-09: qty 120

## 2023-06-09 NOTE — Progress Notes (Signed)
Physical Therapy Treatment Patient Details Name: Eric [name] MRN: [id number] DOB: October 05, 1940 Today's Date: 06/09/2023   History of Present Illness 82 y.o. male presents to [facility] hospital on 05/23/2023 as a transfer from [facility] after a fall. CT head suggestive of acute to subacute L parietal infarct, as well as IPH in R frontal lobe. 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 Pt also found to have C6 vertebral body fx. PMH includes CVA (02/2023), HTN.    PT Comments  Pt admitted with above diagnosis. Met 0/4 goals set and revised goals. Pt has made slower progress than originally anticipated. Will continue to follow pt and progress pt as able.  Pt currently with functional limitations due to the deficits listed below (see PT Problem List). Pt will benefit from acute skilled PT to increase their independence and safety with mobility to allow discharge.       If plan is discharge home, recommend the following: A lot of help with walking and/or transfers;A lot of help with bathing/dressing/bathroom;Direct supervision/assist for medications management;Supervision due to cognitive status;Assist for transportation;Assistance with cooking/housework;Assistance with feeding;Help with stairs or ramp for entrance   Can travel by private vehicle     No  Equipment Recommendations  None recommended by PT    Recommendations for Other Services       Precautions / Restrictions Precautions Precautions: Fall Required Braces or Orthoses: Other Brace Other Brace: soft cervical collar for comfort Restrictions Weight Bearing Restrictions: No     Mobility  Bed Mobility Overal bed mobility: Needs Assistance Bed Mobility: Rolling Rolling: Max assist, +2 for physical assistance   Supine to sit: Max assist, HOB elevated, Used rails, +2 for physical assistance     General bed mobility comments: EOB sitting balance Mod to max+2, strong posterior lean and reduced  core strength.    Transfers                        Ambulation/Gait                   Stairs             Wheelchair Mobility     Tilt Bed    Modified Rankin (Stroke Patients Only)       Balance Overall balance assessment: Needs assistance Sitting-balance support: Bilateral upper extremity supported, Feet supported Sitting balance-Leahy Scale: Poor Sitting balance - Comments: Able to sit EOB 8 min with MaxA to mod assist. Pt leaning posteriorly and to the L.  Worked on sitting balance and attempts at neck and head control with pt unable to extend neck to command and needing assist. Did place on soft collar for sitting. Postural control: Posterior lean, Left lateral lean                                  Cognition Arousal: Lethargic Behavior During Therapy: Flat affect Overall Cognitive Status: Impaired/Different from baseline Area of Impairment: Following commands, Safety/judgement, Problem solving                 Orientation Level: Disoriented to, Place, Situation, Time Current Attention Level: Sustained   Following Commands: Follows one step commands inconsistently Safety/Judgement: Decreased awareness of safety, Decreased awareness of deficits   Problem Solving: Slow processing, Requires verbal cues, Requires tactile cues General Comments: Pt with eyes open some of session but only keeps  them open with cueing. responding to questions with yes/no and occasionally small sentences        Exercises General Exercises - Lower Extremity Ankle Circles/Pumps: PROM, Both, 5 reps, Supine Short Arc Quad: AROM, Both, 10 reps Heel Slides: PROM, Both, 5 reps, Supine    General Comments        Pertinent Vitals/Pain Pain Assessment Pain Assessment: Faces Faces Pain Scale: Hurts little more Pain Location: "all over" Pain Descriptors / Indicators: Discomfort, Grimacing, Guarding, Aching, Moaning Pain Intervention(s): Limited  activity within patient's tolerance, Monitored during session, Repositioned    Home Living                          Prior Function            PT Goals (current goals can now be found in the care plan section) Acute Rehab PT Goals Patient Stated Goal: to get better PT Goal Formulation: With patient/family Time For Goal Achievement: 06/23/23 Potential to Achieve Goals: Fair Progress towards PT goals: Not progressing toward goals - comment (slower progress than anticipated)    Frequency    Min 1X/week      PT Plan      Co-evaluation              AM-PAC PT "6 Clicks" Mobility   Outcome Measure  Help needed turning from your back to your side while in a flat bed without using bedrails?: Total Help needed moving from lying on your back to sitting on the side of a flat bed without using bedrails?: Total Help needed moving to and from a bed to a chair (including a wheelchair)?: Total Help needed standing up from a chair using your arms (e.g., wheelchair or bedside chair)?: Total Help needed to walk in hospital room?: Total Help needed climbing 3-5 steps with a railing? : Total 6 Click Score: 6    End of Session Equipment Utilized During Treatment: Oxygen;Cervical collar Activity Tolerance: Patient limited by pain;Patient limited by fatigue Patient left: in bed;with family/visitor present;with call bell/phone within reach;with bed alarm set Nurse Communication: Mobility status PT Visit Diagnosis: Other abnormalities of gait and mobility (R26.89);Muscle weakness (generalized) (M62.81);History of falling (Z91.81)     Time: 8119-1478 PT Time Calculation (min) (ACUTE ONLY): 21 min  Charges:    $Therapeutic Activity: 8-22 mins PT General Charges $$ ACUTE PT VISIT: 1 Visit                     Von Quintanar M,PT Acute Rehab Services [phone]    Bevelyn Buckles 06/09/2023, 1:49 PM

## 2023-06-09 NOTE — Progress Notes (Signed)
PHARMACY - ANTICOAGULATION CONSULT NOTE  Pharmacy Consult for heparin Indication: AA thrombus/renal infarct   Allergies  Allergen Reactions   Hctz [Hydrochlorothiazide] Other (See Comments)    "Peeing too much"    Patient Measurements: Height: 5\' 8"  (172.7 cm) Weight:  (Unable to obtain accurate bed weight (currently reading 30.2 kg compared to 57Kg x2 days ago). Pt too weak to stand for standing scale at this time.) IBW/kg (Calculated) : 68.4 Heparin Dosing Weight: 57 kg  Vital Signs: Temp: 98 F (36.7 C) (10/07 1200) Temp Source: Axillary (10/07 1200) BP: 124/73 (10/07 1200) Pulse Rate: 89 (10/07 1200)  Labs: Recent Labs    06/07/23 0226 06/07/23 0741 06/07/23 0741 06/07/23 1033 06/07/23 2126 06/08/23 0444 06/08/23 1105 06/09/23 0319 06/09/23 0809  HGB  --  11.0*   < >  --   --  12.1*  --  11.3*  --   HCT  --  30.9*  --   --   --  33.9*  --  32.2*  --   PLT  --  398  --   --   --  407*  --  394  --   APTT 68*  --   --   --  68* 85*  --   --   --   HEPARINUNFRC 0.67  --   --   --  0.59 0.62  --  0.72*  --   CREATININE  --  9.95*   < > 10.17*  --   --  11.54*  --  12.79*   < > = values in this interval not displayed.    Estimated Creatinine Clearance: 3.6 mL/min (A) (by C-G formula based on SCr of 12.79 mg/dL (H)).   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  Patient was transitioned to Apixaban 9/25, however patient has declined and has been unable to take PO meds for several days.  Last apixaban dose 9/29.  Pharmacy has been consulted to start heparin.  Heparin level came back slightly supratherapeutic. We will adjust slightly and repeat level.   Goal of Therapy:  Heparin level 0.3-0.7 units/ml Monitor platelets by anticoagulation protocol: Yes   Plan:  Decrease heparin to 700 units/hr Check 8 hr HL Monitor daily heparin level, CBC, signs/symptoms of bleeding   Ulyses Southward, PharmD, BCIDP, AAHIVP, CPP Infectious Disease Pharmacist 06/09/2023 2:10 PM

## 2023-06-09 NOTE — Progress Notes (Signed)
Brief Palliative Medicine Progress Note:  PMT following peripherally for needs/decline:  Medical records reviewed including progress notes, labs, imaging. Patient has been hospitalized for 18 days without improvement despite interventions, in fact he has worsened and is likely approaching end of life.   Multiple family meetings have been completed for GOC with PMT. Goals are clear for full scope care, DNR/DNI, no PEG.  Comfort measures are recommended for this man with advanced age, multiple comorbidities, recurrent hospitalizations, and poor oral intake. During previous discussions family have had conflicting opinions on next steps - anticipate hospital death.  PMT will follow peripherally and visit with patient and family incrementally for goals of care discussions as appropriate and based on clinical course. If there are any imminent needs please call the service directly. Family also has PMT contact information should further needs arise.  Thank you for allowing PMT to assist in the care of this patient.  Rylyn Ranganathan M. [facility] Palliative Medicine Team Team Phone: [phone] NO CHARGE

## 2023-06-09 NOTE — Progress Notes (Signed)
PHARMACY - ANTICOAGULATION CONSULT NOTE  Pharmacy Consult for heparin Indication: AA thrombus/renal infarct   Allergies  Allergen Reactions   Hctz [Hydrochlorothiazide] Other (See Comments)    "Peeing too much"    Patient Measurements: Height: 5\' 8"  (172.7 cm) Weight:  (Unable to obtain accurate bed weight (currently reading 30.2 kg compared to 57Kg x2 days ago). Pt too weak to stand for standing scale at this time.) IBW/kg (Calculated) : 68.4 Heparin Dosing Weight: 57 kg  Vital Signs: Temp: 98.3 F (36.8 C) (10/07 1600) Temp Source: Axillary (10/07 1600) BP: 141/73 (10/07 1600) Pulse Rate: 90 (10/07 1600)  Labs: Recent Labs    06/07/23 0226 06/07/23 0741 06/07/23 0741 06/07/23 1033 06/07/23 2126 06/08/23 0444 06/08/23 1105 06/09/23 0319 06/09/23 0809 06/09/23 1537  HGB  --  11.0*   < >  --   --  12.1*  --  11.3*  --   --   HCT  --  30.9*  --   --   --  33.9*  --  32.2*  --   --   PLT  --  398  --   --   --  407*  --  394  --   --   APTT 68*  --   --   --  68* 85*  --   --   --   --   HEPARINUNFRC 0.67  --   --   --  0.59 0.62  --  0.72*  --  0.81*  CREATININE  --  9.95*   < > 10.17*  --   --  11.54*  --  12.79*  --    < > = values in this interval not displayed.    Estimated Creatinine Clearance: 3.6 mL/min (A) (by C-G formula based on SCr of 12.79 mg/dL (H)).   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  Patient was transitioned to Apixaban 9/25, however patient has declined and has been unable to take PO meds for several days.  Last apixaban dose 9/29.  Pharmacy has been consulted to start heparin.  Heparin level remains supratherapeutic at 0.81 after heparin drip reduced this AM. D/w RN - heparin level drawn from opposite arm as heparin infusion.   Goal of Therapy:  Heparin level 0.3-0.7 units/ml Monitor platelets by anticoagulation protocol: Yes   Plan:  Decrease heparin to 600 units/hr Check 8 hr HL Monitor daily heparin level, CBC, signs/symptoms of bleeding   Rexford Maus, PharmD, BCPS 06/09/2023 4:44 PM

## 2023-06-09 NOTE — Plan of Care (Signed)
  Problem: Nutrition: Goal: Risk of aspiration will decrease Outcome: Progressing   Problem: Clinical Measurements: Goal: Will remain free from infection Outcome: Progressing   Problem: Coping: Goal: Level of anxiety will decrease Outcome: Progressing

## 2023-06-09 NOTE — Progress Notes (Signed)
PROGRESS NOTE        PATIENT DETAILS Name: Eric [name] Age: 82 y.o. Sex: male Date of Birth: 1941-04-27 Admit Date: 05/22/2023 Admitting Physician Angie Fava, DO XBM:[id number], Georgina Quint, MD  Brief Summary:  Patient is a 82 y.o.  male with history of CVA, HTN-who sustained a mechanical fall at home-upon further evaluation-was found to have acute CVA and C6 vertebral body fracture.  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  Acute CVA with right frontal lobe, was confirmed by MRI on 9/20, patient was seen by neurology, and neurosurgery, as well patient was noted to have urinary retention, secondary to BPH, with Foley catheter was placed by urology Dr. Alvester Morin, CT abdomen pelvis with contrast was obtained secondary to abdominal pain, which was significant foronocclusive, pedunculated, partially adherent thrombus in the mid abdominal aorta  which occludes the ostium of the right renal artery, vascular surgery consulted, recommended anticoagulation after patient was cleared by neurology, patient continues to be significantly deconditioned, with dysphagia in the setting of his stroke, with micro aspiration, who has been treated with pneumonia, he developed elevated LFTs, which thought to be secondary to [facility] as he has been treated with Unasyn, he is on meropenem, function continue to worsen, where he is currently anuric, renal has been consulted, patient is not HD candidate, patient with  multiorgan failure, comorbidities, poor baseline, and progressive decline over the last few month, so palliative medicine has been following closely with family regarding goals of care.   Consults: Neurology, urology, Pall care, GI,renal  Subjective:  Patient with some restlessness and agitation yesterday, but responded well to Ativan    Objective: Vitals: Blood pressure 124/73, pulse 89, temperature 98 F (36.7 C), temperature source Axillary, resp. rate 12, height 5\' 8"  (1.727 m), weight 57.6 kg, SpO2 96%.   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  Pain regimen adjusted for better pain control.  HTN -Initially allowing for permissive hypertension, blood pressure currently acceptable, continue with as needed hydralazine  Acute renal failure -creatinine continues trending up, appears to be  anuric, renal input greatly appreciated, multifactorial,  with ischemic and prerenal insults, especially he received clinically indicated contrasted studies as well -Will consult greatly appreciated, this appears to be irreversible, rapidly progressive, patient at end-of-life which I have discussed with multiple family members, multiple times   Hyperkalemia -Potassium up to 5.6 today, will give D50 with IV insulin, calcium gluconate, and Kayexalate per rectum    Transaminitis-elevated LFTs -Likely related to DILI from Unasyn, Unasyn stopped 9/29 -Avoid penicillins  Aspiration pneumonia  -Patient high risk for aspiration, did not do well with MBS study 06/03/2023  -Patient continues to have worsening pneumonia on imaging, and clinically as he remains febrile, with worsening leukocytosis despite being treated appropriately with meropenem, confirmed by chest x-ray 10/2 which showed worsening pneumonia . -He remains n.p.o. as high risk for aspiration   Acute encephalopathy Dysphagia -With very poor mentation, continues to deteriorate, in the setting of multiple electrolyte derangement, uremia, renal failure, hypernatremia . -As well significant dysphagia, high risk for aspiration, I have discussed with multiple family members, concerned about nutrition, I have informed them is not a TPN candidate, but did discuss with him about nasogastric tube, discussed with multiple family members, so far seem the consensus not to proceed with it, but they will discuss among themselves and let me know if they want to proceed, but for now we will keep holding upon the request as discussed multiple times with him.  But I have informed him [place] prognosis remains very poor regardless starting tube feeds are not as his condition is dated to rapidly progressive multiorgan failure.    Hypernatremia  - he is currently anuric, holding on any IV fluids  -Patient is not safe to swallow secondary to aspiration, and encephalopathy, discussed with son on 10/3, son was asking  about nutrition including TPN, I told him that TPN is not indicated here given patient has no bowel obstruction, as well he is having transaminitis, I have discussed with him about cortrak tube feed, he would like to hold on that for now I have discussed with his daughter again today, they would like to hold on it for now, and they will discuss among themselves.  HLD   -Holding Crestor due to elevated LFTs.  BPH  - Proscar but developed urinary retention, urology was called Foley catheter was placed by Dr. Alvester Morin on 05/25/2023.   Abdominal aortic thrombus, with right renal artery occlusion  -Vascular surgery input greatly appreciated, plan for repeat CTA once stable, but unfortunately renal function does not allow for any IV contrast . -Continue with full anticoagulation .  He remains on IV heparin.  Patchy increased sclerosis within the T12 vertebral body, nonspecific.  - Outpatient further evaluation with nonemergent MRI of the thoracic spine.  Hypokalemia.  Replace.  Acute encephalopathy -Multifactorial, in the setting of CVA presentation, infection, uremia with worsening renal failure.  As well hospital delirium  Goals of care -With multiple comorbidities, cachectic, with failure to thrive, poor oral intake, high risk for aspiration, acute CVA, with worsening renal failure, anuric, not HD candidate, liver injury, encephalopathic, patient with progressive decline for a few month prior to hospitalization, I have discussed with multiple family members, have discussed multiple family members over the last couple days, I have informed them patient is with multiorgan failure, he is nearing end-of-life, with a irreversible decline, I have recommended DNR, comfort care, he is unlikely to survive his multiorgan failure, palliative medicine is assisting with goals of care discussion, I have recommended  full comfort measures, DNR to the family, and this has been felt again by other subspecialist, as  currently patient he is actively dying, at end-of-life, palliative medicine has been following closely, patient has been made to DNR, but will continue with full scope of treatment currently    Underweight: Estimated body mass index is 19.31 kg/m as calculated from the following:   Height as of this encounter: 5\' 8"  (1.727 m).   Weight as of this encounter: 57.6 kg.   Code status:   Code Status: Do not attempt resuscitation (DNR) PRE-ARREST INTERVENTIONS DESIRED   DVT Prophylaxis: SCDs Start: 05/22/23 2315   Family Communication: Been discussing with multiple family members every day at bedside, I have discussed with son Jerilynn Som at bedside earlier this morning, and with daughter [name] and patient's wife early afternoon.     Disposi I have tion Plan: Status is: Inpatient Remains inpatient appropriate because: Severity of illness   Planned Discharge Destination:Home health   Diet: Diet Order             Diet NPO time specified  Diet effective now                   MEDICATIONS: Scheduled Meds:  carvedilol  6.25 mg Oral BID WC   Chlorhexidine Gluconate Cloth  6 each Topical Daily   finasteride  5 mg Oral Daily   mirtazapine  15 mg Oral QHS   tamsulosin  0.4 mg Oral Daily   Continuous Infusions:  heparin 700 Units/hr (06/09/23 0727)     PRN Meds:.acetaminophen **OR** acetaminophen, fentaNYL (SUBLIMAZE) injection, haloperidol lactate, hydrALAZINE, labetalol, LORazepam, melatonin, naLOXone (NARCAN)  injection, ondansetron (ZOFRAN) IV, polyethylene glycol   I have personally reviewed following labs and imaging studies  LABORATORY DATA:  Recent Labs  Lab 06/03/23 0315 06/04/23 0313 06/05/23 0310 06/06/23 0840 06/07/23 0741 06/08/23 0444 06/09/23 0319  WBC 13.1* 23.4* 40.5* 35.8* 23.6* 20.9* 21.6*  HGB 12.6* 11.7* 11.2* 11.2* 11.0* 12.1* 11.3*  HCT 36.7* 33.5* 32.0* 31.4* 30.9* 33.9* 32.2*  PLT 401* 420* 432* 415* 398 407* 394  MCV 77.3* 77.2* 78.4* 77.7*  74.8* 75.8* 74.7*  MCH 26.5 27.0 27.5 27.7 26.6 27.1 26.2  MCHC 34.3 34.9 35.0 35.7 35.6 35.7 35.1  RDW 14.7 14.6 14.8 14.6 14.3 14.1 14.3  LYMPHSABS 0.6* 1.2 1.2  --   --   --   --   MONOABS 1.0 0.9 1.6*  --   --   --   --   EOSABS 0.0 0.0 0.0  --   --   --   --   BASOSABS 0.0 0.0 0.0  --   --   --   --     Recent Labs  Lab 06/03/23 0315 06/04/23 0313 06/05/23 0310 06/06/23 0840 06/07/23 0741 06/07/23 1033 06/07/23 1726 06/08/23 1105 06/09/23 0809  NA 142 144 143 144 143 144  --  147* 148*  K 4.0 4.5 4.8 5.1 5.5* 5.5* 5.4* 5.1 5.6*  CL 107 109 107 109 108 108  --  108 109  CO2 20* 21* 20* 20* 18* 17*  --  20* 21*  ANIONGAP 15 14 16* 15 17* 19*  --  19* 18*  GLUCOSE 146* 183* 91 88 76 179*  --  98 96  BUN 36* 55* 73* 104* 128* 132*  --  143* 161*  CREATININE 3.26* 4.88* 6.54* 8.57* 9.95* 10.17*  --  11.54* 12.79*  AST 349* 239* 431*  --   --   --   --  626*  --   ALT 513* 370* 360*  --   --   --   --  275*  --   ALKPHOS 148* 149* 164*  --   --   --   --  221*  --   BILITOT 1.1 1.0 1.2  --   --   --   --  1.5*  --   ALBUMIN 2.0* 1.7* <1.5*  --   --   --   --  <1.5*  --   CRP 31.8* 36.2*  --   --   --   --   --   --   --   PROCALCITON 3.19 6.25 7.21  --   --   --   --   --   --   INR 2.0*  --   --   --   --   --   --   --   --   BNP 50.2 56.5  --   --   --   --   --   --   --   MG 2.2 2.4  --   --   --   --   --   --   --   CALCIUM 8.8* 8.5* 8.5* 8.7* 8.5* 8.5*  --  9.0 9.0    Lab Results  Component Value Date   CHOL 116 05/23/2023   HDL 65 05/23/2023   LDLCALC 39 05/23/2023   LDLDIRECT 163.3 05/26/2008   TRIG 59 05/23/2023   CHOLHDL 1.8 05/23/2023   Lab Results  Component Value Date   HGBA1C 6.3 (H) 05/26/2023      Recent Labs  Lab 06/03/23 0315 06/04/23 0313 06/05/23 0310 06/06/23 0840 06/07/23 0741 06/07/23 1033 06/08/23 1105 06/09/23 0809  CRP 31.8* 36.2*  --   --   --   --   --   --   PROCALCITON 3.19 6.25 7.21  --   --   --   --   --   INR 2.0*   --   --   --   --   --   --   --   BNP 50.2 56.5  --   --   --   --   --   --   MG 2.2 2.4  --   --   --   --   --   --   CALCIUM 8.8* 8.5* 8.5* 8.7* 8.5* 8.5* 9.0 9.0    RADIOLOGY STUDIES/RESULTS: No results found.   LOS: 18 days   Signature  -    Huey Bienenstock M.D on 06/09/2023 at 2:51 PM   -  To page go to [email or web address]

## 2023-06-10 DIAGNOSIS — I639 Cerebral infarction, unspecified: Secondary | ICD-10-CM | POA: Diagnosis not present

## 2023-06-10 DIAGNOSIS — N179 Acute kidney failure, unspecified: Secondary | ICD-10-CM | POA: Diagnosis not present

## 2023-06-10 DIAGNOSIS — G9340 Encephalopathy, unspecified: Secondary | ICD-10-CM | POA: Diagnosis not present

## 2023-06-10 DIAGNOSIS — S12501D Unspecified nondisplaced fracture of sixth cervical vertebra, subsequent encounter for fracture with routine healing: Secondary | ICD-10-CM | POA: Diagnosis not present

## 2023-06-10 LAB — BASIC METABOLIC PANEL
Anion gap: 26 — ABNORMAL HIGH (ref 5–15)
BUN: 187 mg/dL — ABNORMAL HIGH (ref 8–23)
CO2: 17 mmol/L — ABNORMAL LOW (ref 22–32)
Calcium: 8.9 mg/dL (ref 8.9–10.3)
Chloride: 108 mmol/L (ref 98–111)
Creatinine, Ser: 13.78 mg/dL — ABNORMAL HIGH (ref 0.61–1.24)
GFR, Estimated: 3 mL/min — ABNORMAL LOW (ref >60)
Glucose, Bld: 109 mg/dL — ABNORMAL HIGH (ref 70–99)
Potassium: 5.5 mmol/L — ABNORMAL HIGH (ref 3.5–5.1)
Sodium: 151 mmol/L — ABNORMAL HIGH (ref 135–145)

## 2023-06-10 LAB — CBC
HCT: 32.6 % — ABNORMAL LOW (ref 39.0–52.0)
Hemoglobin: 11.5 g/dL — ABNORMAL LOW (ref 13.0–17.0)
MCH: 26.4 pg (ref 26.0–34.0)
MCHC: 35.3 g/dL (ref 30.0–36.0)
MCV: 74.9 fL — ABNORMAL LOW (ref 80.0–100.0)
Platelets: 361 10*3/uL (ref 150–400)
RBC: 4.35 MIL/uL (ref 4.22–5.81)
RDW: 14.5 % (ref 11.5–15.5)
WBC: 19.2 10*3/uL — ABNORMAL HIGH (ref 4.0–10.5)
nRBC: 0 % (ref 0.0–0.2)

## 2023-06-10 LAB — HEPARIN LEVEL (UNFRACTIONATED)
Heparin Unfractionated: 0.55 [IU]/mL (ref 0.30–0.70)
Heparin Unfractionated: 0.54 [IU]/mL (ref 0.30–0.70)

## 2023-06-10 MED ORDER — FUROSEMIDE 10 MG/ML IJ SOLN
80.0000 mg | Freq: Once | INTRAMUSCULAR | Status: AC
  Administered 2023-06-10: 80 mg via INTRAVENOUS
  Filled 2023-06-10: qty 8

## 2023-06-10 MED ORDER — SODIUM BICARBONATE 8.4 % IV SOLN
50.0000 meq | Freq: Once | INTRAVENOUS | Status: AC
  Administered 2023-06-10: 50 meq via INTRAVENOUS
  Filled 2023-06-10: qty 50

## 2023-06-10 MED ORDER — DEXTROSE 5 % IV SOLN: INTRAVENOUS | Status: AC

## 2023-06-10 MED ORDER — DEXTROSE 50 % IV SOLN
1.0000 | Freq: Once | INTRAVENOUS | Status: AC
  Administered 2023-06-10: 50 mL via INTRAVENOUS
  Filled 2023-06-10: qty 50

## 2023-06-10 MED ORDER — INSULIN ASPART 100 UNIT/ML IV SOLN
5.0000 [IU] | Freq: Once | INTRAVENOUS | Status: AC
  Administered 2023-06-10: 5 [IU] via INTRAVENOUS

## 2023-06-10 NOTE — Plan of Care (Signed)
  Problem: Clinical Measurements: Goal: Respiratory complications will improve Outcome: Progressing Goal: Cardiovascular complication will be avoided Outcome: Progressing   Problem: Pain Managment: Goal: General experience of comfort will improve Outcome: Progressing

## 2023-06-10 NOTE — Progress Notes (Signed)
PROGRESS NOTE        PATIENT DETAILS Name: Eric [name] Age: 82 y.o. Sex: male Date of Birth: 02-Mar-1941 Admit Date: 05/22/2023 Admitting Physician Angie Fava, DO XLK:[id number], Georgina Quint, MD  Brief Summary:  Patient is a 82 y.o.  male with history of CVA, HTN-who sustained a mechanical fall at home-upon further evaluation-was found to have acute CVA and C6 vertebral body fracture.  Patient was initially evaluated at 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  Acute CVA with right frontal lobe, was confirmed by MRI on 9/20, patient was seen by neurology, and neurosurgery, as well patient was noted to have urinary retention, secondary to BPH, with Foley catheter was placed by urology Dr. Alvester Morin, CT abdomen pelvis with contrast was obtained secondary to abdominal pain, which was significant foronocclusive, pedunculated, partially adherent thrombus in the mid abdominal aorta  which occludes the ostium of the right renal artery, vascular surgery consulted, recommended anticoagulation after patient was cleared by neurology, patient continues to be significantly deconditioned, with dysphagia in the setting of his stroke, with micro aspiration, who has been treated with pneumonia, he developed elevated LFTs, which thought to be secondary to [facility] as he has been treated with Unasyn, he is on meropenem, function continue to worsen, where he is currently anuric, renal has been consulted, patient is not HD candidate, patient with  multiorgan failure, comorbidities, poor baseline, and progressive decline over the last few month, so palliative medicine has been following closely with family regarding goals of care.   Consults: Neurology, urology, Pall care, GI,renal  Subjective:   Son at bedside, reported father was displaced overnight, otherwise no significant events  Objective: Vitals: Blood pressure 139/74, pulse 90, temperature 97.6 F (36.4 C), temperature source Axillary, resp. rate 13, height 5\' 8"  (1.727 m), weight 57.6 kg, SpO2 97%.   Exam:  Patient is somnolent, in no apparent distress, appears comfortable, frail, chronically ill-appearing, thin appearing Diminished air entry bilaterally, no wheezing, no respiratory distress, and use of accessory muscles Regular rate and rhythm Abdomen soft, bowel sounds present, scaphoid Extremities with no edema, clubbing or cyanosis   Assessment/Plan:  Acute Ischemic Infarct:  Subacute left parietal infarct with possible hemorrhage infarct vs hemorrhagic contusion in right frontal region -Likely due to embolic versus vessel disease -Neurology input greatly appreciated, CTA head and neck significant for 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  Pain regimen adjusted for better pain control.  HTN -Initially allowing for permissive hypertension, blood pressure currently acceptable, continue with as needed hydralazine  Acute renal  failure -creatinine continues  trending up, appears to be anuric, renal input greatly appreciated, multifactorial, with ischemic and prerenal insults, especially he received clinically indicated contrasted studies as well -renalconsult greatly appreciated, this appears to be irreversible, rapidly progressive, patient at end-of-life which I have discussed with multiple family members, multiple times   Hyperkalemia -Potassium up, this has responded to D50 with IV insulin, calcium gluconate, repeat level pending today   Transaminitis-elevated LFTs -Likely related to DILI from Unasyn, Unasyn stopped 9/29 -Avoid penicillins  Aspiration pneumonia  -Patient high risk for aspiration, did not do well with MBS study 06/03/2023  -Patient continues to have worsening pneumonia on imaging, and clinically as he remains febrile, with worsening leukocytosis despite being treated appropriately with meropenem, confirmed by chest x-ray 10/2 which showed worsening pneumonia . -He remains n.p.o. as high risk for aspiration   Acute encephalopathy Dysphagia -With very poor mentation, continues to deteriorate, in the setting of multiple electrolyte derangement, uremia, renal failure, hypernatremia . -As well significant dysphagia, high risk for aspiration, I have discussed with multiple family members, concerned about nutrition, I have informed them is not a TPN candidate, discussed with them core Trak tube, they were hesitant, discussed among themselves multiple times, but son report family are agreeable to it today, so cortrak request has been put, and should be inserted by tomorrow   Hypernatremia  - he is currently anuric, IV fluids largely on hold, but sodium keep trending up, so we will give D5W for brief period today followed by Lasix. -Can start free water once cortrak is inserted tomorrow  HLD   -Holding Crestor due to elevated LFTs.  BPH  - Proscar but developed urinary retention, urology was called Foley catheter was placed by Dr.  Alvester Morin on 05/25/2023.   Abdominal aortic thrombus, with right renal artery occlusion  -Vascular surgery input greatly appreciated, plan for repeat CTA once stable, but unfortunately renal function does not allow for any IV contrast . -Continue with full anticoagulation .  He remains on IV heparin.  Patchy increased sclerosis within the T12 vertebral body, nonspecific.  - Outpatient further evaluation with nonemergent MRI of the thoracic spine.  Hypokalemia.  Replace.  Acute encephalopathy -Multifactorial, in the setting of CVA presentation, infection, uremia with worsening renal failure.  As well hospital delirium  Goals of care -With multiple comorbidities, cachectic, with failure to thrive, poor oral intake, high risk for aspiration, acute CVA, with worsening renal failure, anuric, not HD candidate, liver injury, encephalopathic, patient with progressive decline for a few month prior to hospitalization, I have discussed with multiple family members, have discussed multiple family members over the last couple days, I have informed them patient is with multiorgan failure, he is nearing end-of-life, with a irreversible decline, I have recommended DNR, comfort care, he is unlikely to survive his multiorgan failure, palliative medicine is assisting with goals of care discussion, I have recommended full comfort measures, DNR to the family, and this has been felt again by other subspecialist, as currently patient he is actively dying, at end-of-life, palliative medicine has been following closely, patient has been made to DNR, but will continue with full scope of treatment currently    Underweight: Estimated body mass index is 19.31 kg/m as calculated from the following:   Height as of this encounter: 5\' 8"  (1.727 m).   Weight as of this encounter: 57.6 kg.   Code status:   Code Status: Do not attempt resuscitation (DNR) PRE-ARREST INTERVENTIONS DESIRED   DVT Prophylaxis:  SCDs Start: 05/22/23  2315  Family communication discussed with son Jerilynn Som this morning.     Disposi I have tion Plan: Status is: Inpatient Remains inpatient appropriate because: Severity of illness   Planned Discharge Destination:Home health   Diet: Diet Order             Diet NPO time specified  Diet effective now                   MEDICATIONS: Scheduled Meds:  carvedilol  6.25 mg Oral BID WC   Chlorhexidine Gluconate Cloth  6 each Topical Daily   insulin aspart  5 Units Intravenous Once   And   dextrose  1 ampule Intravenous Once   finasteride  5 mg Oral Daily   mirtazapine  15 mg Oral QHS   sodium bicarbonate  50 mEq Intravenous Once   tamsulosin  0.4 mg Oral Daily   Continuous Infusions:  heparin 600 Units/hr (06/09/23 1736)     PRN Meds:.acetaminophen **OR** acetaminophen, fentaNYL (SUBLIMAZE) injection, haloperidol lactate, hydrALAZINE, labetalol, LORazepam, melatonin, naLOXone (NARCAN)  injection, ondansetron (ZOFRAN) IV, polyethylene glycol   I have personally reviewed following labs and imaging studies  LABORATORY DATA:  Recent Labs  Lab 06/04/23 0313 06/05/23 0310 06/06/23 0840 06/07/23 0741 06/08/23 0444 06/09/23 0319 06/10/23 0310  WBC 23.4* 40.5* 35.8* 23.6* 20.9* 21.6* 19.2*  HGB 11.7* 11.2* 11.2* 11.0* 12.1* 11.3* 11.5*  HCT 33.5* 32.0* 31.4* 30.9* 33.9* 32.2* 32.6*  PLT 420* 432* 415* 398 407* 394 361  MCV 77.2* 78.4* 77.7* 74.8* 75.8* 74.7* 74.9*  MCH 27.0 27.5 27.7 26.6 27.1 26.2 26.4  MCHC 34.9 35.0 35.7 35.6 35.7 35.1 35.3  RDW 14.6 14.8 14.6 14.3 14.1 14.3 14.5  LYMPHSABS 1.2 1.2  --   --   --   --   --   MONOABS 0.9 1.6*  --   --   --   --   --   EOSABS 0.0 0.0  --   --   --   --   --   BASOSABS 0.0 0.0  --   --   --   --   --     Recent Labs  Lab 06/04/23 0313 06/05/23 0310 06/06/23 0840 06/07/23 0741 06/07/23 1033 06/07/23 1726 06/08/23 1105 06/09/23 0809  NA 144 143 144 143 144  --  147* 148*  K 4.5 4.8 5.1 5.5* 5.5* 5.4* 5.1  5.6*  CL 109 107 109 108 108  --  108 109  CO2 21* 20* 20* 18* 17*  --  20* 21*  ANIONGAP 14 16* 15 17* 19*  --  19* 18*  GLUCOSE 183* 91 88 76 179*  --  98 96  BUN 55* 73* 104* 128* 132*  --  143* 161*  CREATININE 4.88* 6.54* 8.57* 9.95* 10.17*  --  11.54* 12.79*  AST 239* 431*  --   --   --   --  626*  --   ALT 370* 360*  --   --   --   --  275*  --   ALKPHOS 149* 164*  --   --   --   --  221*  --   BILITOT 1.0 1.2  --   --   --   --  1.5*  --   ALBUMIN 1.7* <1.5*  --   --   --   --  <1.5*  --   CRP 36.2*  --   --   --   --   --   --   --  PROCALCITON 6.25 7.21  --   --   --   --   --   --   BNP 56.5  --   --   --   --   --   --   --   MG 2.4  --   --   --   --   --   --   --   CALCIUM 8.5* 8.5* 8.7* 8.5* 8.5*  --  9.0 9.0    Lab Results  Component Value Date   CHOL 116 05/23/2023   HDL 65 05/23/2023   LDLCALC 39 05/23/2023   LDLDIRECT 163.3 05/26/2008   TRIG 59 05/23/2023   CHOLHDL 1.8 05/23/2023   Lab Results  Component Value Date   HGBA1C 6.3 (H) 05/26/2023      Recent Labs  Lab 06/04/23 0313 06/05/23 0310 06/06/23 0840 06/07/23 0741 06/07/23 1033 06/08/23 1105 06/09/23 0809  CRP 36.2*  --   --   --   --   --   --   PROCALCITON 6.25 7.21  --   --   --   --   --   BNP 56.5  --   --   --   --   --   --   MG 2.4  --   --   --   --   --   --   CALCIUM 8.5* 8.5* 8.7* 8.5* 8.5* 9.0 9.0    RADIOLOGY STUDIES/RESULTS: No results found.   LOS: 19 days   Signature  -    Huey Bienenstock M.D on 06/10/2023 at 4:26 PM   -  To page go to [email or web address]

## 2023-06-10 NOTE — Progress Notes (Signed)
PHARMACY - ANTICOAGULATION Pharmacy Consult for heparin Indication: AA thrombus/renal infarct  Brief A/P: Heparin level within goal range Continue Heparin at current rate   Allergies  Allergen Reactions   Hctz [Hydrochlorothiazide] Other (See Comments)    "Peeing too much"    Patient Measurements: Height: 5\' 8"  (172.7 cm) Weight:  (Unable to obtain accurate bed weight (currently reading 30.2 kg compared to 57Kg x2 days ago). Pt too weak to stand for standing scale at this time.) IBW/kg (Calculated) : 68.4 Heparin Dosing Weight: 57 kg  Vital Signs: Temp: 98.1 F (36.7 C) (10/08 0000) Temp Source: Oral (10/08 0000) BP: 150/74 (10/08 0000) Pulse Rate: 92 (10/08 0000)  Labs: Recent Labs    06/07/23 0226 06/07/23 0741 06/07/23 0741 06/07/23 1033 06/07/23 2126 06/08/23 0444 06/08/23 1105 06/09/23 0319 06/09/23 0809 06/09/23 1537 06/10/23 0052  HGB  --  11.0*   < >  --   --  12.1*  --  11.3*  --   --   --   HCT  --  30.9*  --   --   --  33.9*  --  32.2*  --   --   --   PLT  --  398  --   --   --  407*  --  394  --   --   --   APTT 68*  --   --   --  68* 85*  --   --   --   --   --   HEPARINUNFRC 0.67  --   --   --  0.59 0.62  --  0.72*  --  0.81* 0.55  CREATININE  --  9.95*   < > 10.17*  --   --  11.54*  --  12.79*  --   --    < > = values in this interval not displayed.    Estimated Creatinine Clearance: 3.6 mL/min (A) (by C-G formula based on SCr of 12.79 mg/dL (H)).   Assessment: 82 yo male with AA thrombus/renal infarct for heparin   Goal of Therapy:  Heparin level 0.3-0.7 units/ml Monitor platelets by anticoagulation protocol: Yes   Plan:  Continue Heparin at current rate  Follow-up am labs.  Geannie Risen, PharmD, BCPS  06/10/2023 2:10 AM

## 2023-06-11 ENCOUNTER — Inpatient Hospital Stay (HOSPITAL_COMMUNITY): Payer: Medicare Other

## 2023-06-11 DIAGNOSIS — E43 Unspecified severe protein-calorie malnutrition: Secondary | ICD-10-CM | POA: Insufficient documentation

## 2023-06-11 DIAGNOSIS — I639 Cerebral infarction, unspecified: Secondary | ICD-10-CM | POA: Diagnosis not present

## 2023-06-11 DIAGNOSIS — K719 Toxic liver disease, unspecified: Secondary | ICD-10-CM | POA: Diagnosis not present

## 2023-06-11 DIAGNOSIS — G9340 Encephalopathy, unspecified: Secondary | ICD-10-CM | POA: Diagnosis not present

## 2023-06-11 DIAGNOSIS — I10 Essential (primary) hypertension: Secondary | ICD-10-CM | POA: Diagnosis not present

## 2023-06-11 LAB — CBC
HCT: 34 % — ABNORMAL LOW (ref 39.0–52.0)
Hemoglobin: 11.9 g/dL — ABNORMAL LOW (ref 13.0–17.0)
MCH: 26.4 pg (ref 26.0–34.0)
MCHC: 35 g/dL (ref 30.0–36.0)
MCV: 75.4 fL — ABNORMAL LOW (ref 80.0–100.0)
Platelets: 325 10*3/uL (ref 150–400)
RBC: 4.51 MIL/uL (ref 4.22–5.81)
RDW: 14.6 % (ref 11.5–15.5)
WBC: 19.5 10*3/uL — ABNORMAL HIGH (ref 4.0–10.5)
nRBC: 0 % (ref 0.0–0.2)

## 2023-06-11 LAB — GLUCOSE, CAPILLARY: Glucose-Capillary: 128 mg/dL — ABNORMAL HIGH (ref 70–99)

## 2023-06-11 LAB — BASIC METABOLIC PANEL
Anion gap: 21 — ABNORMAL HIGH (ref 5–15)
BUN: 190 mg/dL — ABNORMAL HIGH (ref 8–23)
CO2: 21 mmol/L — ABNORMAL LOW (ref 22–32)
Calcium: 9.2 mg/dL (ref 8.9–10.3)
Chloride: 109 mmol/L (ref 98–111)
Creatinine, Ser: 14.57 mg/dL — ABNORMAL HIGH (ref 0.61–1.24)
GFR, Estimated: 3 mL/min — ABNORMAL LOW (ref >60)
Glucose, Bld: 109 mg/dL — ABNORMAL HIGH (ref 70–99)
Potassium: 5.4 mmol/L — ABNORMAL HIGH (ref 3.5–5.1)
Sodium: 151 mmol/L — ABNORMAL HIGH (ref 135–145)

## 2023-06-11 LAB — HEPATIC FUNCTION PANEL
ALT: 57 U/L — ABNORMAL HIGH (ref 0–44)
AST: 159 U/L — ABNORMAL HIGH (ref 15–41)
Albumin: 1.5 g/dL — ABNORMAL LOW (ref 3.5–5.0)
Alkaline Phosphatase: 185 U/L — ABNORMAL HIGH (ref 38–126)
Bilirubin, Direct: 0.4 mg/dL — ABNORMAL HIGH (ref 0.0–0.2)
Indirect Bilirubin: 0.7 mg/dL (ref 0.3–0.9)
Total Bilirubin: 1.1 mg/dL (ref 0.3–1.2)
Total Protein: 5.9 g/dL — ABNORMAL LOW (ref 6.5–8.1)

## 2023-06-11 LAB — HEPARIN LEVEL (UNFRACTIONATED): Heparin Unfractionated: 0.26 [IU]/mL — ABNORMAL LOW (ref 0.30–0.70)

## 2023-06-11 LAB — MAGNESIUM: Magnesium: 3.5 mg/dL — ABNORMAL HIGH (ref 1.7–2.4)

## 2023-06-11 LAB — PHOSPHORUS: Phosphorus: 8.1 mg/dL — ABNORMAL HIGH (ref 2.5–4.6)

## 2023-06-11 MED ORDER — APIXABAN 5 MG PO TABS: 5.0000 mg | ORAL_TABLET | Freq: Two times a day (BID) | ORAL | Status: DC

## 2023-06-11 MED ORDER — THIAMINE MONONITRATE 100 MG PO TABS
100.0000 mg | ORAL_TABLET | Freq: Every day | ORAL | Status: DC
  Administered 2023-06-11 – 2023-06-14 (×4): 100 mg
  Filled 2023-06-11 (×4): qty 1

## 2023-06-11 MED ORDER — FREE WATER
200.0000 mL | Freq: Three times a day (TID) | Status: DC
  Administered 2023-06-11 – 2023-06-14 (×10): 200 mL

## 2023-06-11 MED ORDER — ENOXAPARIN SODIUM 60 MG/0.6ML IJ SOSY
60.0000 mg | PREFILLED_SYRINGE | INTRAMUSCULAR | Status: DC
  Administered 2023-06-11: 60 mg via SUBCUTANEOUS
  Filled 2023-06-11: qty 0.6

## 2023-06-11 MED ORDER — OSMOLITE 1.5 CAL PO LIQD
1000.0000 mL | ORAL | Status: DC
  Administered 2023-06-11 – 2023-06-13 (×2): 1000 mL
  Filled 2023-06-11 (×4): qty 1000

## 2023-06-11 MED ORDER — APIXABAN 5 MG PO TABS
10.0000 mg | ORAL_TABLET | Freq: Two times a day (BID) | ORAL | Status: DC
  Administered 2023-06-12: 10 mg via ORAL
  Filled 2023-06-11: qty 2

## 2023-06-11 MED ORDER — SODIUM ZIRCONIUM CYCLOSILICATE 10 G PO PACK
10.0000 g | PACK | Freq: Two times a day (BID) | ORAL | Status: DC
  Administered 2023-06-11 (×2): 10 g
  Filled 2023-06-11 (×2): qty 1

## 2023-06-11 MED ORDER — PROSOURCE TF20 ENFIT COMPATIBL EN LIQD
60.0000 mL | Freq: Every day | ENTERAL | Status: DC
  Administered 2023-06-11 – 2023-06-14 (×4): 60 mL
  Filled 2023-06-11 (×4): qty 60

## 2023-06-11 NOTE — Procedures (Signed)
Cortrak  Person Inserting Tube:  Mahala Menghini, RD Tube Type:  Cortrak - 43 inches Tube Size:  10 Tube Location:  Left nare Secured by: Bridle Technique Used to Measure Tube Placement:  Marking at nare/corner of mouth Cortrak Secured At:  63 cm   Cortrak Tube Team Note:  Consult received to place a Cortrak feeding tube.   X-ray is required, abdominal x-ray has been ordered by the Cortrak team. Please confirm tube placement before using the Cortrak tube.   If the tube becomes dislodged please keep the tube and contact the Cortrak team at [email or web address] for replacement.  If after hours and replacement cannot be delayed, place a NG tube and confirm placement with an abdominal x-ray.    Mertie Clause, MS, RD, LDN Registered Dietitian II Please see AMiON for contact information.

## 2023-06-11 NOTE — Progress Notes (Addendum)
PHARMACY - ANTICOAGULATION Pharmacy Consult for heparin>Lovenox Indication: AA thrombus/renal infarct  Brief A/P: Heparin level within goal range Continue Heparin at current rate   Allergies  Allergen Reactions   Hctz [Hydrochlorothiazide] Other (See Comments)    "Peeing too much"    Patient Measurements: Height: 5\' 8"  (172.7 cm) Weight:  (Unable to obtain accurate bed weight (currently reading 30.2 kg compared to 57Kg x2 days ago). Pt too weak to stand for standing scale at this time.) IBW/kg (Calculated) : 68.4 Heparin Dosing Weight: 57 kg  Vital Signs: Temp: 97.3 F (36.3 C) (10/09 0402) Temp Source: Axillary (10/09 0402) BP: 157/78 (10/09 0402) Pulse Rate: 88 (10/09 0620)  Labs: Recent Labs    06/09/23 0319 06/09/23 0809 06/09/23 1537 06/10/23 0052 06/10/23 0310 06/10/23 1623 06/11/23 0323  HGB 11.3*  --   --   --  11.5*  --  11.9*  HCT 32.2*  --   --   --  32.6*  --  34.0*  PLT 394  --   --   --  361  --  325  HEPARINUNFRC 0.72*  --    < > 0.55 0.54  --  0.26*  CREATININE  --  12.79*  --   --   --  13.78* 14.57*   < > = values in this interval not displayed.    Estimated Creatinine Clearance: 3.2 mL/min (A) (by C-G formula based on SCr of 14.57 mg/dL (H)).   Assessment: 82 yo male with AA thrombus/renal infarct for heparin. Heparin came back subtherapeutic this AM. Will adjust and recheck level.   Addendum  Due to difficult stick issue, ok to transition him to Lovenox and monitor levels if needed.   Goal of Therapy:  Anti-Xa: 0.6-1 Monitor platelets by anticoagulation protocol: Yes   Plan:  Dc heparin Lovenox 60mg  SQ qday Check anti-xa if needed  Ulyses Southward, PharmD, BCIDP, AAHIVP, CPP Infectious Disease Pharmacist 06/11/2023 7:23 AM

## 2023-06-11 NOTE — Plan of Care (Signed)
  Problem: Education: Goal: Knowledge of disease or condition will improve Outcome: Progressing   Problem: Coping: Goal: Will identify appropriate support needs Outcome: Progressing   Problem: Health Behavior/Discharge Planning: Goal: Goals will be collaboratively established with patient/family Outcome: Progressing   Problem: Nutrition: Goal: Risk of aspiration will decrease Outcome: Progressing

## 2023-06-11 NOTE — Progress Notes (Signed)
PHARMACY - ANTICOAGULATION Pharmacy Consult for Lovenox>>apix  Indication: AA thrombus/renal infarct    Allergies  Allergen Reactions   Hctz [Hydrochlorothiazide] Other (See Comments)    "Peeing too much"    Patient Measurements: Height: 5\' 8"  (172.7 cm) Weight:  (Unable to obtain accurate bed weight (currently reading 30.2 kg compared to 57Kg x2 days ago). Pt too weak to stand for standing scale at this time.) IBW/kg (Calculated) : 68.4 Heparin Dosing Weight: 57 kg  Vital Signs: Temp: 97.6 F (36.4 C) (10/09 1136) Temp Source: Axillary (10/09 1136) BP: 134/78 (10/09 1136) Pulse Rate: 91 (10/09 1136)  Labs: Recent Labs    06/09/23 0319 06/09/23 0809 06/09/23 1537 06/10/23 0052 06/10/23 0310 06/10/23 1623 06/11/23 0323  HGB 11.3*  --   --   --  11.5*  --  11.9*  HCT 32.2*  --   --   --  32.6*  --  34.0*  PLT 394  --   --   --  361  --  325  HEPARINUNFRC 0.72*  --    < > 0.55 0.54  --  0.26*  CREATININE  --  12.79*  --   --   --  13.78* 14.57*   < > = values in this interval not displayed.    Estimated Creatinine Clearance: 3.2 mL/min (A) (by C-G formula based on SCr of 14.57 mg/dL (H)).   Assessment: 82 yo male with AA thrombus/renal infarct for heparin. Patient switched to lovenox earlier in the day due to issue with difficult stick. Pharmacy has been consulted to dose apixaban for aortic thrombus/renal infarct. Patient has been mostly therapeutic on heparin/lovenox for the past 5 days- will have an abbreviated loading period. Hgb has been stable in the 11s.    Goal of Therapy:  Anti-Xa: 0.6-1 Monitor platelets by anticoagulation protocol: Yes   Plan:  Dc Lovenox  Start apixaban 10 mg BID x 4 doses, then apixaban 5 mg bid - administer first dose 10/10 0900  CBC in AM    Jani Gravel, PharmD Clinical Pharmacist  06/11/2023 3:40 PM

## 2023-06-11 NOTE — Progress Notes (Addendum)
Initial Nutrition Assessment  DOCUMENTATION CODES:   Severe malnutrition in context of chronic illness  INTERVENTION: consider alternate feeding should family choose to continue with full scope of care.  Initiate tube feeding via cortrak when placed.  Osmolite 1.5 at 45 ml/h (1080 ml per day) (Initiate 56ml/hr increase 10ml every 24hrs till goal rate is met.  Prosource TF20 60 ml 1 x day FWF every 8 hrs thiamine 100 mg daily x 7 days  Provides 1700 kcal, 88 gm protein, 1423 ml free water daily  Monitor magnesium, potassium, and phosphorus BID for at least 3 days, MD to replete as needed, as pt is at risk for refeeding syndrome given NPO x 7 days decreased oral intake x 3 weeks.   NUTRITION DIAGNOSIS:   Severe Malnutrition related to chronic illness as evidenced by severe fat depletion, severe muscle depletion, NPO status, percent weight loss, energy intake < or equal to 50% for > or equal to 5 days (8.5% x 30 days).    GOAL:   Patient will meet greater than or equal to 90% of their needs    MONITOR:   Labs, Weight trends, Other (Comment) (goals of care)  REASON FOR ASSESSMENT:   NPO/Clear Liquid Diet, Malnutrition Screening Tool    ASSESSMENT:   82 y.o.  male with history of CVA, HTN-who sustained a mechanical fall at home-upon further evaluation-was found to have acute CVA and C6 vertebral body fracture.  Patient was initially evaluated at [facility] and transferred to [facility] service at [facility] for further evaluation. 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  Evaluated by nephrology-not thought to be a dialysis candidate-palliative care following-made DNR-but family still wanting full scope of care at this time.  Patient has been NPO x 7 days with family back forth on goals of  care. Cortrak discussed in rounds yesterday.  RD's consulted for cortrak today pending family decision. Suspect, patient is indicative for TPN at this time due to over all health status.  Pt not alert at time of visit.  Did not wake during visit. Son at bedside. Discussed alternate feeding options with son. Informed of risks of refeeding. Son said that his dad wakes up and states that he is hungry, RD explained that EN support may not keep him from feeling hungry.  Son stated he was going to have meeting with siblings to discuss and let the Dr. Ashley Mariner the goals of care.  10/9 14:49-Family has decided to have cortrak placed.    Admit weight: 57.6 kg Current weight: 57.6 kg  Weight history: 06/04/23 57.6 kg  04/17/23 61 kg  04/17/23 61.2 kg  03/13/23 63 kg  02/22/23 66.7 kg  12/26/22 66.7 kg     Average Meal Intake: NPO  Nutritionally Relevant Medications: Scheduled Meds:  carvedilol  6.25 mg Oral BID WC   enoxaparin (LOVENOX) injection  60 mg Subcutaneous Q24H   finasteride  5 mg Oral Daily   mirtazapine  15 mg Oral QHS   tamsulosin  0.4 mg Oral Daily    Continuous Infusions: PRN Meds:.acetaminophen **OR** acetaminophen, fentaNYL (SUBLIMAZE) injection, haloperidol lactate, hydrALAZINE, labetalol, LORazepam, melatonin, naLOXone (NARCAN)  injection, ondansetron (ZOFRAN) IV, polyethylene glycol  Labs Reviewed: K: 5.4, Na: 151 CBG ranges from 109-109 mg/dL over the last 24 hours HgbA1c 6.3    NUTRITION - FOCUSED PHYSICAL EXAM:  Flowsheet Row  Most Recent Value  Orbital Region Severe depletion  Upper Arm Region Unable to assess  [Pt restrained]  Thoracic and Lumbar Region Moderate depletion  Buccal Region Severe depletion  Temple Region Severe depletion  Clavicle Bone Region Moderate depletion  Clavicle and Acromion Bone Region Moderate depletion  Scapular Bone Region Unable to assess  Dorsal Hand Unable to assess  Patellar Region Moderate depletion  Anterior Thigh Region  Moderate depletion  Posterior Calf Region Severe depletion  Edema (RD Assessment) None  Hair Reviewed  Eyes Unable to assess  Mouth Unable to assess  Skin Reviewed  Nails Unable to assess       Diet Order:   Diet Order             Diet NPO time specified  Diet effective now                   EDUCATION NEEDS:   Not appropriate for education at this time  Skin:  Skin Assessment: Skin Integrity Issues: Skin Integrity Issues:: Stage I Stage I: Back lateral, Vertebral colum lower  Last BM:  10/05  Height:   Ht Readings from Last 1 Encounters:  05/22/23 5\' 8"  (1.727 m)    Weight:   Wt Readings from Last 1 Encounters:  06/04/23 57.6 kg    Ideal Body Weight:     BMI:  Body mass index is 19.31 kg/m.  Estimated Nutritional Needs:   Kcal:  1700-2000 kcal/day  Protein:  75-90 g/day  Fluid:  1700-2000 ml/day    Jamelle Haring RDN, LDN Clinical Dietitian  RDN pager # available on Amion

## 2023-06-11 NOTE — Progress Notes (Signed)
Xray completed and resulted after cortrak placement. Per MD okay to use.

## 2023-06-11 NOTE — Progress Notes (Signed)
Brief Palliative Medicine Progress Note:  PMT following peripherally for needs/decline:  Medical records reviewed including progress notes, labs, imaging. Patient has been hospitalized for 19 days without improvement despite interventions, but rather he has worsened and is likely approaching end of life.   Multiple family meetings have been completed for GOC with PMT. Goals are clear for full scope care, DNR/DNI, no PEG. Noted that family has agreed to cortrak placement today.  Comfort measures are recommended for this man with advanced age, multiple comorbidities, recurrent hospitalizations, and poor oral intake. During previous discussions family have had conflicting opinions on next steps - anticipate hospital death.  PMT will continue to follow peripherally and visit with patient and family incrementally for goals of care discussions as appropriate and based on clinical course. If there are any imminent needs please call the service directly. Family also has PMT contact information should further needs arise.  Thank you for allowing PMT to assist in the care of this patient.  Richardson Dopp, PA-C Palliative Medicine Team Team Phone: [phone] NO CHARGE

## 2023-06-11 NOTE — Progress Notes (Addendum)
PROGRESS NOTE        PATIENT DETAILS Name: Eric [name] Age: 82 y.o. Sex: male Date of Birth: 1941/05/25 Admit Date: 05/22/2023 Admitting Physician Angie Fava, DO RUE:[id number], Georgina Quint, MD  Brief Summary: Patient is a 82 y.o.  male with history of CVA, HTN-who sustained a mechanical fall at home-upon further evaluation-was found to have acute CVA and C6 vertebral body fracture.  Patient was initially evaluated at [facility] and transferred to [facility] service at [facility] for further evaluation.  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  Evaluated by nephrology-not thought to be a dialysis candidate-palliative care following-made DNR-but family still wanting full scope of care.  See below for further details.  Significant events: 9/19>> admit to [facility]  Significant studies: 6/23>> A1c: 5.8 9/19>> CT head: 0.9 cm focus of intraparenchymal hemorrhage right frontal lobe.  Acute to subacute cortical infarct left parietal lobe. 9/19 >>CT C-spine: Acute nondisplaced fracture through the anterior inferior corner of C6 vertebral body. 9/20>> CT head: Stable 1 cm anterior right middle frontal gyrus hemorrhagic contusion versus trace subarachnoid blood. 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 9/20>> MRI brain: Acute infarct anterior right frontal lobe. 9/20>> echo: EF 55-60% 9/20>> LDL: 112 9/21>> MRI C-spine: Known fracture of C1 is not well-seen on the study. 9/23>> CT chest/abdomen/pelvis: Nonocclusive partially drained thrombus in the mid abdominal aorta-which occludes the ostium of the right renal artery.  Extensive wedge-shaped  at areas of the right kidney-compatible with renal infarction.  Bilateral pneumonia. 9/24>> CT head: Mild increase in [facility]. 9/25>> MRI C-spine: Motion degraded study-tiny fracture of C6 vertebral body, marrow edema of the C5 spinous process. 9/27>> RUQ ultrasound: Unremarkable  Significant microbiology data: 9/23>> urine culture: No growth 9/23>> blood culture: No growth  Procedures: None  Consults: Neurology Nephrology Vascular surgery Neurosurgery Urology Palliative care  Subjective: Lethargic-barely will open eyes-but upon repeated vocal stimuli-will respond with short sentences.  He then goes back to sleep.  Complains of some abdominal pain-son at bedside.  Objective: Vitals: Blood pressure (!) 157/78, pulse 88, temperature (!) 97.3 F (36.3 C), temperature source Axillary, resp. rate 17, height 5\' 8"  (1.727 m), weight 57.6 kg, SpO2 96%.   Exam: Gen Exam:not in any distress-very lethargic. HEENT:atraumatic, normocephalic Chest: B/L clear to auscultation anteriorly CVS:S1S2 regular Abdomen:soft non tender, non distended Extremities:no edema Neurology: Difficult exam but seems to be moving all 4 extremities  skin: no rash  Pertinent Labs/Radiology:    Latest Ref Rng & Units 06/11/2023    3:23 AM 06/10/2023    3:10 AM 06/09/2023    3:19 AM  CBC  WBC 4.0 - 10.5 K/uL 19.5  19.2  21.6   Hemoglobin 13.0 - 17.0 g/dL 14.7  82.9  56.2   Hematocrit 39.0 - 52.0 % 34.0  32.6  32.2   Platelets 150 - 400 K/uL 325  361  394     Lab Results  Component Value Date   NA 151 (H) 06/11/2023   K 5.4 (H) 06/11/2023   CL 109 06/11/2023   CO2 21 (L) 06/11/2023     Assessment/Plan: Acute CVA (hemorrhagic infarct)  versus hemorrhagic contusion in the right frontal region Workup as above Currently encephalopathic-but gradually moves all 4 extremities-suspect nonfocal exam. Not on antiplatelets-on anticoagulation for aortic thrombus-with renal artery occlusion and resultant renal  infarct.  C6 vertebral body fracture Secondary to mechanical fall MRI-poor study-fracture not very visible. Evaluated by neurosurgery-hard collar removed on 9/22. Supportive care  Oliguric AKI Multifactorial-ischemic, contrast nephropathy-as possible etiologies Evaluated by nephrology-not a HD candidate He is very encephalopathic-developed hyperkalemia-and is very close to end-of-life-Long discussion with son at bedside-have recommended transitioning to full comfort measures. Await further family discussion-in the interim-avoid nephrotoxic agents, and monitor renal function closely.  Hyperkalemia Secondary to AKI Encephalopathy prevents oral intake-family discussion in progress whether they actually want a NG tube (see discussion below). Potassium levels mildly elevated today-watch closely.  Acute metabolic encephalopathy Secondary to uremia Supportive care-see above  Oropharyngeal dysphagia Likely due to severe encephalopathy due to uremia Long discussion with son at bedside on 10/9-although NG tube could potentially be placed-given encephalopathy-he will likely require restraints to keep it in.  Furthermore-do not think it is chronic changes clinical trajectory-he will likely still continue to deteriorate.  Son will get in touch with other family members to see if they really want to proceed with NG tube placement.  Hypernatremia Secondary to poor oral intake Await family decision regarding NG tube-if they do want to proceed-will add some free water through the NG tube.  Drug-induced liver injury Thought to be secondary to Unasyn LFTs downtrending  Acute urinary retention BPH Acute urinary retention likely due to BPH-Foley catheter placed by urology on 9/22 Not currently a candidate for voiding trial-severely encephalopathic Flomax/Finasteride if he is able to take any oral medications.  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  Palliative care DNR in place Clearly close to end-of-life-has oliguric AKI with hyperkalemia/encephalopathy-he is not a HD candidate-very frail and clearly deteriorating and spite of being in the hospital for close to 3 weeks.  Long discussion with son at bedside-I have recommended that we consider initiating full comfort measures.  Family to have further discussions before we actually put cortrak tube in.  Nutrition Status: Nutrition Problem: Severe Malnutrition Etiology: chronic illness Signs/Symptoms: severe fat depletion, severe muscle depletion, NPO status, percent weight loss, energy intake < or equal to 50% for > or equal to 5 days (8.5% x 30 days) Percent weight loss: 8.5 % Interventions: Refer to RD note for recommendations    Underweight: Estimated body mass index is 19.31 kg/m as calculated from the following:   Height as of this encounter: 5\' 8"  (1.727 m).   Weight as of this encounter: 57.6 kg.   Code status:   Code Status: Do not attempt resuscitation (DNR) PRE-ARREST INTERVENTIONS DESIRED   DVT Prophylaxis: SCDs Start: 05/22/23 2315   Family Communication: Son at bedside   Disposition Plan: Status is: Inpatient Remains inpatient appropriate because: Severity of illness   Planned Discharge Destination: Inpatient that versus residential hospice versus SNF   Diet: Diet Order             Diet NPO time specified  Diet effective now                     Antimicrobial agents: Anti-infectives (From admission, onward)    Start     Dose/Rate Route Frequency Ordered Stop   06/06/23 1000  meropenem (MERREM) 500 mg in sodium  chloride 0.9 % 100 mL IVPB        500 mg 200 mL/hr over 30 Minutes Intravenous Every 24 hr x 2 06/05/23 1341 06/07/23 1232   06/03/23 0900  meropenem (MERREM) 500 mg in sodium chloride 0.9 % 100 mL IVPB  Status:   Discontinued        500 mg 200 mL/hr over 30 Minutes Intravenous Every 12 hours 06/03/23 0803 06/05/23 1341   05/26/23 1430  Ampicillin-Sulbactam (UNASYN) 3 g in sodium chloride 0.9 % 100 mL IVPB        3 g 200 mL/hr over 30 Minutes Intravenous Every 12 hours 05/26/23 1342 06/01/23 0824        MEDICATIONS: Scheduled Meds:  carvedilol  6.25 mg Oral BID WC   Chlorhexidine Gluconate Cloth  6 each Topical Daily   finasteride  5 mg Oral Daily   mirtazapine  15 mg Oral QHS   tamsulosin  0.4 mg Oral Daily   Continuous Infusions:  heparin 600 Units/hr (06/09/23 1736)   PRN Meds:.acetaminophen **OR** acetaminophen, fentaNYL (SUBLIMAZE) injection, haloperidol lactate, hydrALAZINE, labetalol, LORazepam, melatonin, naLOXone (NARCAN)  injection, ondansetron (ZOFRAN) IV, polyethylene glycol   I have personally reviewed following labs and imaging studies  LABORATORY DATA: CBC: Recent Labs  Lab 06/05/23 0310 06/06/23 0840 06/07/23 0741 06/08/23 0444 06/09/23 0319 06/10/23 0310 06/11/23 0323  WBC 40.5*   < > 23.6* 20.9* 21.6* 19.2* 19.5*  NEUTROABS 37.3*  --   --   --   --   --   --   HGB 11.2*   < > 11.0* 12.1* 11.3* 11.5* 11.9*  HCT 32.0*   < > 30.9* 33.9* 32.2* 32.6* 34.0*  MCV 78.4*   < > 74.8* 75.8* 74.7* 74.9* 75.4*  PLT 432*   < > 398 407* 394 361 325   < > = values in this interval not displayed.    Basic Metabolic Panel: Recent Labs  Lab 06/07/23 1033 06/07/23 1726 06/08/23 1105 06/09/23 0809 06/10/23 1623 06/11/23 0323  NA 144  --  147* 148* 151* 151*  K 5.5* 5.4* 5.1 5.6* 5.5* 5.4*  CL 108  --  108 109 108 109  CO2 17*  --  20* 21* 17* 21*  GLUCOSE 179*  --  98 96 109* 109*  BUN 132*  --  143* 161* 187* 190*  CREATININE 10.17*  --  11.54* 12.79* 13.78* 14.57*  CALCIUM 8.5*  --  9.0 9.0 8.9 9.2    GFR: Estimated Creatinine Clearance: 3.2 mL/min (A) (by C-G formula based on SCr of 14.57 mg/dL (H)).  Liver Function Tests: Recent Labs  Lab 06/05/23 0310  06/08/23 1105 06/11/23 0323  AST 431* 626* 159*  ALT 360* 275* 57*  ALKPHOS 164* 221* 185*  BILITOT 1.2 1.5* 1.1  PROT 5.9* 6.0* 5.9*  ALBUMIN <1.5* <1.5* 1.5*   No results for input(s): "LIPASE", "AMYLASE" in the last 168 hours. No results for input(s): "AMMONIA" in the last 168 hours.  Coagulation Profile: No results for input(s): "INR", "PROTIME" in the last 168 hours.   Cardiac Enzymes: No results for input(s): "CKTOTAL", "CKMB", "CKMBINDEX", "TROPONINI" in the last 168 hours.  BNP (last 3 results) No results for input(s): "PROBNP" in the last 8760 hours.  Lipid Profile: No results for input(s): "CHOL", "HDL", "LDLCALC", "TRIG", "CHOLHDL", "LDLDIRECT" in the last 72 hours.   Thyroid Function Tests: No results for input(s): "TSH", "T4TOTAL", "FREET4", "T3FREE", "THYROIDAB" in the last 72 hours.   Anemia Panel: No  results for input(s): "VITAMINB12", "FOLATE", "FERRITIN", "TIBC", "IRON", "RETICCTPCT" in the last 72 hours.   Urine analysis:    Component Value Date/Time   COLORURINE YELLOW 05/26/2023 1044   APPEARANCEUR HAZY (A) 05/26/2023 1044   LABSPEC 1.014 05/26/2023 1044   PHURINE 5.0 05/26/2023 1044   GLUCOSEU 50 (A) 05/26/2023 1044   GLUCOSEU NEGATIVE 06/26/2022 0934   HGBUR LARGE (A) 05/26/2023 1044   HGBUR negative 06/01/2010 1023   BILIRUBINUR NEGATIVE 05/26/2023 1044   BILIRUBINUR n 08/15/2015 1530   KETONESUR 20 (A) 05/26/2023 1044   PROTEINUR 100 (A) 05/26/2023 1044   UROBILINOGEN 0.2 06/26/2022 0934   NITRITE NEGATIVE 05/26/2023 1044   LEUKOCYTESUR LARGE (A) 05/26/2023 1044    Sepsis Labs: Lactic Acid, Venous No results found for: "LATICACIDVEN"  MICROBIOLOGY: Recent Results (from the past 240 hour(s))  Varicella-zoster by PCR     Status: None   Collection Time: 06/02/23  1:52 PM   Specimen: Blood  Result Value Ref Range Status   Varicella-Zoster, PCR Negative Negative Final    Comment: (NOTE) No Varicella Zoster Virus DNA  detected. This test was developed and its performance characteristics determined by [facility].  It has not been cleared or approved by the Food and Drug Administration.  The FDA has determined that such clearance or approval is not necessary. Performed At: [facility] [address] [place], [place] [id number] Jolene Schimke MD WU:[id number]     RADIOLOGY STUDIES/RESULTS: No results found.   LOS: 20 days   Jeoffrey Massed, MD  [facility]    To contact the attending provider between 7A-7P or the covering provider during after hours 7P-7A, please log into the web site [email or web address] and access using universal [place] password for that web site. If you do not have the password, please call the hospital operator.  06/11/2023, 7:58 AM

## 2023-06-12 DIAGNOSIS — I10 Essential (primary) hypertension: Secondary | ICD-10-CM | POA: Diagnosis not present

## 2023-06-12 DIAGNOSIS — K719 Toxic liver disease, unspecified: Secondary | ICD-10-CM | POA: Diagnosis not present

## 2023-06-12 DIAGNOSIS — G9340 Encephalopathy, unspecified: Secondary | ICD-10-CM | POA: Diagnosis not present

## 2023-06-12 DIAGNOSIS — I639 Cerebral infarction, unspecified: Secondary | ICD-10-CM | POA: Diagnosis not present

## 2023-06-12 LAB — PHOSPHORUS: Phosphorus: 8.2 mg/dL — ABNORMAL HIGH (ref 2.5–4.6)

## 2023-06-12 LAB — RENAL FUNCTION PANEL
Albumin: 1.6 g/dL — ABNORMAL LOW (ref 3.5–5.0)
Anion gap: 23 — ABNORMAL HIGH (ref 5–15)
BUN: 215 mg/dL — ABNORMAL HIGH (ref 8–23)
CO2: 22 mmol/L (ref 22–32)
Calcium: 9 mg/dL (ref 8.9–10.3)
Chloride: 106 mmol/L (ref 98–111)
Creatinine, Ser: 15.37 mg/dL — ABNORMAL HIGH (ref 0.61–1.24)
GFR, Estimated: 3 mL/min — ABNORMAL LOW (ref >60)
Glucose, Bld: 152 mg/dL — ABNORMAL HIGH (ref 70–99)
Phosphorus: 8.1 mg/dL — ABNORMAL HIGH (ref 2.5–4.6)
Potassium: 5.3 mmol/L — ABNORMAL HIGH (ref 3.5–5.1)
Sodium: 151 mmol/L — ABNORMAL HIGH (ref 135–145)

## 2023-06-12 LAB — GLUCOSE, CAPILLARY
Glucose-Capillary: 131 mg/dL — ABNORMAL HIGH (ref 70–99)
Glucose-Capillary: 134 mg/dL — ABNORMAL HIGH (ref 70–99)
Glucose-Capillary: 137 mg/dL — ABNORMAL HIGH (ref 70–99)
Glucose-Capillary: 146 mg/dL — ABNORMAL HIGH (ref 70–99)
Glucose-Capillary: 155 mg/dL — ABNORMAL HIGH (ref 70–99)
Glucose-Capillary: 159 mg/dL — ABNORMAL HIGH (ref 70–99)

## 2023-06-12 LAB — MAGNESIUM: Magnesium: 3.5 mg/dL — ABNORMAL HIGH (ref 1.7–2.4)

## 2023-06-12 LAB — CBC
HCT: 30.5 % — ABNORMAL LOW (ref 39.0–52.0)
Hemoglobin: 10.6 g/dL — ABNORMAL LOW (ref 13.0–17.0)
MCH: 26.2 pg (ref 26.0–34.0)
MCHC: 34.8 g/dL (ref 30.0–36.0)
MCV: 75.5 fL — ABNORMAL LOW (ref 80.0–100.0)
Platelets: 316 10*3/uL (ref 150–400)
RBC: 4.04 MIL/uL — ABNORMAL LOW (ref 4.22–5.81)
RDW: 14.6 % (ref 11.5–15.5)
WBC: 17.5 10*3/uL — ABNORMAL HIGH (ref 4.0–10.5)
nRBC: 0 % (ref 0.0–0.2)

## 2023-06-12 MED ORDER — CARVEDILOL 6.25 MG PO TABS
6.2500 mg | ORAL_TABLET | Freq: Two times a day (BID) | ORAL | Status: DC
  Administered 2023-06-12 – 2023-06-14 (×4): 6.25 mg
  Filled 2023-06-12 (×4): qty 1

## 2023-06-12 MED ORDER — APIXABAN 5 MG PO TABS
5.0000 mg | ORAL_TABLET | Freq: Two times a day (BID) | ORAL | Status: DC
  Administered 2023-06-12 – 2023-06-14 (×4): 5 mg
  Filled 2023-06-12 (×4): qty 1

## 2023-06-12 MED ORDER — TAMSULOSIN HCL 0.4 MG PO CAPS
0.4000 mg | ORAL_CAPSULE | Freq: Every day | ORAL | Status: DC
  Administered 2023-06-14: 0.4 mg via ORAL
  Filled 2023-06-12 (×2): qty 1

## 2023-06-12 MED ORDER — SODIUM ZIRCONIUM CYCLOSILICATE 10 G PO PACK
10.0000 g | PACK | Freq: Three times a day (TID) | ORAL | Status: DC
  Administered 2023-06-12 – 2023-06-14 (×7): 10 g
  Filled 2023-06-12 (×7): qty 1

## 2023-06-12 NOTE — Progress Notes (Signed)
Physical Therapy Treatment Patient Details Name: Eric [name] MRN: [id number] DOB: 29-Jan-1941 Today's Date: 06/12/2023   History of Present Illness 82 y.o. male presents to [facility] hospital on 05/23/2023 as a transfer from [facility] ED after a fall. CT head suggestive of acute to subacute L parietal infarct, as well as IPH in R frontal lobe. 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 Pt also found to have C6 vertebral body fx. PMH includes CVA (02/2023), HTN.    PT Comments  Pt is making little to no progress towards goals.  Pt reacts to reticular activating system as demonstrating by increased level of arousal in sitting. Pt was stating "yes" "no" and shaking head "yes' "no" with questions intermittently correct. Pt initiated going back to supine from sitting after sitting up ~5 min. Pt was unable physically and with the correct body mechanics, coordination to perform movement with out Max A +2 (total A). Due to pt current functional status, home setup and available assistance at home pt will most likely benefit from continued movement with family and staff for ROM and with lift to chair daily as well as re-positioning for comfort. Will continue to follow at this time for possible improvement with arousal with recommendation for < 3 hours/day of skilled physical therapy services in order to improve mobility, decrease risk for skin break down and decrease burden of care on caregivers.    If plan is discharge home, recommend the following: Supervision due to cognitive status;Assist for transportation;Assistance with cooking/housework;Help with stairs or ramp for entrance;Two people to help with walking and/or transfers   Can travel by private vehicle     No  Equipment Recommendations  None recommended by PT       Precautions / Restrictions Precautions Precautions: Fall Required Braces or Orthoses: Other Brace Other Brace: soft cervical collar for comfort,  cortrack Restrictions Weight Bearing Restrictions: No     Mobility  Bed Mobility Overal bed mobility: Needs Assistance Bed Mobility: Rolling Rolling: Max assist, Total assist, +2 for physical assistance, +2 for safety/equipment   Supine to sit: Max assist, Total assist, +2 for physical assistance, +2 for safety/equipment, HOB elevated Sit to supine: Max assist, Total assist, +2 for physical assistance, +2 for safety/equipment, HOB elevated   General bed mobility comments: Pt was sitting at EOB for several mins but required heavy MAX x2 assist. Pt then started to lay down in session and needed cues to moticate to complete.    Transfers     General transfer comment: unable to progress to standing due to current functional level    Ambulation/Gait   General Gait Details: Unable due to current functional level    Modified Rankin (Stroke Patients Only) Modified Rankin (Stroke Patients Only) Pre-Morbid Rankin Score: Moderate disability Modified Rankin: Severe disability     Balance Overall balance assessment: Needs assistance Sitting-balance support: Bilateral upper extremity supported, Feet supported Sitting balance-Leahy Scale: Poor Sitting balance - Comments: Pt was able to sit atEOB but then started to attempt to lay down with therapist. Attempted to facilitate some WB into RUE but pt attempted to move RUE away. Postural control: Posterior lean, Right lateral lean, Left lateral lean     Standing balance comment: unable to get to standing      Cognition Arousal: Lethargic Behavior During Therapy: Flat affect Overall Cognitive Status: Impaired/Different from baseline Area of Impairment: Following commands     Orientation Level: Disoriented to, Place, Time, Situation  Following Commands: Follows one step commands inconsistently Safety/Judgement: Decreased awareness of safety, Decreased awareness of deficits     General Comments: Pt made very limited  communication and used mostly grunting noises. Pt's son reported it was the most he has done in the last day when working with therapy in attempt to communicate to pt.           General Comments General comments (skin integrity, edema, etc.): Son present throughout session      Pertinent Vitals/Pain Pain Assessment Pain Assessment: Faces Faces Pain Scale: Hurts little more Breathing: occasional labored breathing, short period of hyperventilation Negative Vocalization: occasional moan/groan, low speech, negative/disapproving quality Facial Expression: smiling or inexpressive Body Language: relaxed Consolability: no need to console PAINAD Score: 2 Pain Location: neck/back Pain Descriptors / Indicators: Guarding, Discomfort, Moaning Pain Intervention(s): Monitored during session, Limited activity within patient's tolerance, Repositioned     PT Goals (current goals can now be found in the care plan section) Acute Rehab PT Goals Patient Stated Goal: to get better PT Goal Formulation: With patient/family Time For Goal Achievement: 06/23/23 Potential to Achieve Goals: Fair Progress towards PT goals: Not progressing toward goals - comment (slow progress, very lethargic, not moving towards goals)    Frequency    Min 1X/week      PT Plan  Continue with current POC    Co-evaluation PT/OT/SLP Co-Evaluation/Treatment: Yes Reason for Co-Treatment: Complexity of the patient's impairments (multi-system involvement);Necessary to address cognition/behavior during functional activity;To address functional/ADL transfers PT goals addressed during session: Mobility/safety with mobility OT goals addressed during session: ADL's and self-care      AM-PAC PT "6 Clicks" Mobility   Outcome Measure  Help needed turning from your back to your side while in a flat bed without using bedrails?: Total Help needed moving from lying on your back to sitting on the side of a flat bed without using  bedrails?: Total Help needed moving to and from a bed to a chair (including a wheelchair)?: Total Help needed standing up from a chair using your arms (e.g., wheelchair or bedside chair)?: Total Help needed to walk in hospital room?: Total Help needed climbing 3-5 steps with a railing? : Total 6 Click Score: 6    End of Session   Activity Tolerance: Patient limited by pain;Patient limited by fatigue Patient left: in bed;with family/visitor present;with call bell/phone within reach;with bed alarm set Nurse Communication: Mobility status PT Visit Diagnosis: Other abnormalities of gait and mobility (R26.89);Muscle weakness (generalized) (M62.81);History of falling (Z91.81)     Time: 1610-9604 PT Time Calculation (min) (ACUTE ONLY): 27 min  Charges:    $Therapeutic Activity: 8-22 mins PT General Charges $$ ACUTE PT VISIT: 1 Visit                     Harrel Carina, DPT, CLT  Acute Rehabilitation Services Office: [phone] (Secure chat preferred)    Claudia Desanctis 06/12/2023, 1:03 PM

## 2023-06-12 NOTE — Plan of Care (Signed)
  Problem: Education: Goal: Knowledge of disease or condition will improve Outcome: Progressing Goal: Knowledge of secondary prevention will improve (MUST DOCUMENT ALL) Outcome: Progressing   Problem: Ischemic Stroke/TIA Tissue Perfusion: Goal: Complications of ischemic stroke/TIA will be minimized Outcome: Progressing   Problem: Coping: Goal: Will verbalize positive feelings about self Outcome: Progressing   Problem: Health Behavior/Discharge Planning: Goal: Ability to manage health-related needs will improve Outcome: Progressing   Problem: Self-Care: Goal: Ability to participate in self-care as condition permits will improve Outcome: Progressing   Problem: Nutrition: Goal: Risk of aspiration will decrease Outcome: Progressing

## 2023-06-12 NOTE — Progress Notes (Signed)
Occupational Therapy Treatment Patient Details Name: Eric [name] MRN: [id number] DOB: 1941-02-13 Today's Date: 06/12/2023   History of present illness 82 y.o. male presents to [facility] hospital on 05/23/2023 as a transfer from [facility] ED after a fall. CT head suggestive of acute to subacute L parietal infarct, as well as IPH in R frontal lobe. 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 Pt also found to have C6 vertebral body fx. PMH includes CVA (02/2023), HTN.   OT comments  Pt was present with son in the room. Per son they want to continue to attempt to work with therapies at this time even with the progression of medical condition. Pt noted when first working with the patient that they required max assist to arousal pt but then started to progress with opening eyes. Pt requires total care 1-2 person assist at bed level for all ADLS. Attempted to complete bed mobility with max-total x2 assist and sitting at EOB but then pt started to attempt to lay back into supine position. Acute Occupational Therapy will continue to follow.       If plan is discharge home, recommend the following:  Two people to help with walking and/or transfers;A lot of help with bathing/dressing/bathroom;Assistance with feeding;Assistance with cooking/housework;Direct supervision/assist for financial management;Supervision due to cognitive status;Direct supervision/assist for medications management;Help with stairs or ramp for entrance   Equipment Recommendations   (TBD at next level of care)    Recommendations for Other Services      Precautions / Restrictions Precautions Precautions: Fall Required Braces or Orthoses: Other Brace Other Brace: soft cervical collar for comfort, cortrack Restrictions Weight Bearing Restrictions: No       Mobility Bed Mobility Overal bed mobility: Needs Assistance Bed Mobility: Rolling Rolling: Max assist, Total assist, +2 for physical  assistance, +2 for safety/equipment   Supine to sit: Max assist, Total assist, +2 for physical assistance, +2 for safety/equipment, HOB elevated Sit to supine: Max assist, Total assist, +2 for physical assistance, +2 for safety/equipment, HOB elevated   General bed mobility comments: Pt was sitting at EOB for several mins but required heavy MAX x2 assist. Pt then started to lay down in session and needed cues to moticate to complete.    Transfers                   General transfer comment: deffered     Balance Overall balance assessment: Needs assistance Sitting-balance support: Bilateral upper extremity supported, Feet supported Sitting balance-Leahy Scale: Poor Sitting balance - Comments: Pt was able to sit atEOB but then started to attempt to lay down with therapist. Attempted to facilitate some WB into RUE but pt attempted to move RUE away. Postural control: Posterior lean, Right lateral lean, Left lateral lean   Standing balance-Leahy Scale: Zero Standing balance comment: Total A                           ADL either performed or assessed with clinical judgement   ADL Overall ADL's : Needs assistance/impaired Eating/Feeding: NPO Eating/Feeding Details (indicate cue type and reason): cortrack Grooming: Total assistance;Bed level   Upper Body Bathing: Total assistance;Bed level;Maximal assistance   Lower Body Bathing: Total assistance;+2 for physical assistance;+2 for safety/equipment;Bed level   Upper Body Dressing : Total assistance;Maximal assistance;Bed level   Lower Body Dressing: Total assistance;Bed level     Toilet Transfer Details (indicate cue type and reason): unable to complete  Toileting- Clothing Manipulation and Hygiene: Total assistance;+2 for physical assistance;+2 for safety/equipment;Bed level              Extremity/Trunk Assessment Upper Extremity Assessment Upper Extremity Assessment: RUE deficits/detail;LUE  deficits/detail RUE Deficits / Details: Difficult to assess but trace activation at hand, when sitting at EOB they attempting to slid LUE across bed RUE Coordination: decreased fine motor;decreased gross motor LUE Deficits / Details: Pt noted to use LUE more in session and noted to increase in following cues when using but difficult to assess full AROM/strength LUE Coordination: decreased fine motor;decreased gross motor   Lower Extremity Assessment Lower Extremity Assessment: Defer to PT evaluation        Vision       Perception     Praxis      Cognition Arousal: Lethargic Behavior During Therapy: Flat affect Overall Cognitive Status: Impaired/Different from baseline Area of Impairment: Following commands, Safety/judgement, Problem solving                 Orientation Level: Disoriented to, Place, Time, Situation Current Attention Level: Sustained   Following Commands: Follows one step commands inconsistently Safety/Judgement: Decreased awareness of safety, Decreased awareness of deficits   Problem Solving: Slow processing, Requires verbal cues, Requires tactile cues General Comments: Pt made very liited communication and used mostly grunting noises. Pt's son reported it was the most he has done in the last day when working with therapisies in attempt to communicate to pt.        Exercises      Shoulder Instructions       General Comments      Pertinent Vitals/ Pain       Pain Assessment Pain Assessment: Faces Faces Pain Scale: Hurts little more Breathing: occasional labored breathing, short period of hyperventilation Negative Vocalization: occasional moan/groan, low speech, negative/disapproving quality Facial Expression: smiling or inexpressive Body Language: relaxed Consolability: no need to console PAINAD Score: 2 Pain Location: neck/back Pain Descriptors / Indicators: Guarding, Discomfort, Moaning Pain Intervention(s): Limited activity within  patient's tolerance, Monitored during session, Repositioned  Home Living                                          Prior Functioning/Environment              Frequency  Min 1X/week        Progress Toward Goals  OT Goals(current goals can now be found in the care plan section)  Progress towards OT goals: Goals drowngraded-see care plan  Acute Rehab OT Goals Patient Stated Goal: to have dad keep trying with therapies OT Goal Formulation: With patient/family Time For Goal Achievement: 06/26/23 Potential to Achieve Goals: Fair ADL Goals Pt Will Perform Upper Body Bathing: bed level;with set-up Additional ADL Goal #1: Pt will demonstrate improved attention to R side by using scanning strategies to locate 3/3 objects on that side Additional ADL Goal #2: Pt will tolerate 5 mins sitting EOB with min A in preparation for seated bADLs. Additional ADL Goal #3: Pt family will demonstrate independence with rolling pt L<>R in bed for pressure relief.  Plan      Co-evaluation    PT/OT/SLP Co-Evaluation/Treatment: Yes     OT goals addressed during session: ADL's and self-care      AM-PAC OT "6 Clicks" Daily Activity     Outcome Measure   Help from another  person eating meals?: Total Help from another person taking care of personal grooming?: Total Help from another person toileting, which includes using toliet, bedpan, or urinal?: Total Help from another person bathing (including washing, rinsing, drying)?: Total Help from another person to put on and taking off regular upper body clothing?: Total Help from another person to put on and taking off regular lower body clothing?: Total 6 Click Score: 6    End of Session    OT Visit Diagnosis: Unsteadiness on feet (R26.81);Other abnormalities of gait and mobility (R26.89);Muscle weakness (generalized) (M62.81);Low vision, both eyes (H54.2);Pain Pain - part of body:  (neck)   Activity Tolerance Patient limited  by fatigue   Patient Left in bed;with call bell/phone within reach;with bed alarm set;with family/visitor present   Nurse Communication Mobility status        Time: 3664-4034 OT Time Calculation (min): 26 min  Charges: OT General Charges $OT Visit: 1 Visit OT Treatments $Self Care/Home Management : 8-22 mins  Presley Raddle OTR/L  Acute Rehab Services  [phone] office number   Alphia Moh 06/12/2023, 12:33 PM

## 2023-06-12 NOTE — Progress Notes (Signed)
PROGRESS NOTE        PATIENT DETAILS Name: Eric [name] Age: 82 y.o. Sex: male Date of Birth: 10-29-1940 Admit Date: 05/22/2023 Admitting Physician Angie Fava, DO NWG:[id number], Georgina Quint, MD  Brief Summary: Patient is a 82 y.o.  male with history of CVA, HTN-who sustained a mechanical fall at home-upon further evaluation-was found to have acute CVA and C6 vertebral body fracture.  Patient was initially evaluated at [facility] and transferred to [facility] service at [facility] for further evaluation.  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  Evaluated by nephrology-not thought to be a dialysis candidate-palliative care following-made DNR-but family still wanting full scope of care.  See below for further details.  Significant events: 9/19>> admit to [facility]  Significant studies: 6/23>> A1c: 5.8 9/19>> CT head: 0.9 cm focus of intraparenchymal hemorrhage right frontal lobe.  Acute to subacute cortical infarct left parietal lobe. 9/19 >>CT C-spine: Acute nondisplaced fracture through the anterior inferior corner of C6 vertebral body. 9/20>> CT head: Stable 1 cm anterior right middle frontal gyrus hemorrhagic contusion versus trace subarachnoid blood. 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 9/20>> MRI brain: Acute infarct anterior right frontal lobe. 9/20>> echo: EF 55-60% 9/20>> LDL: 112 9/21>> MRI C-spine: Known fracture of C1 is not well-seen on the study. 9/23>> CT chest/abdomen/pelvis: Nonocclusive partially drained thrombus in the mid abdominal aorta-which occludes the ostium of the right renal artery.  Extensive wedge-shaped  at areas of the right kidney-compatible with renal infarction.  Bilateral pneumonia. 9/24>> CT head: Mild increase in [facility]. 9/25>> MRI C-spine: Motion degraded study-tiny fracture of C6 vertebral body, marrow edema of the C5 spinous process. 9/27>> RUQ ultrasound: Unremarkable  Significant microbiology data: 9/23>> urine culture: No growth 9/23>> blood culture: No growth  Procedures: None  Consults: Neurology Nephrology Vascular surgery Neurosurgery Urology Palliative care  Subjective: Very lethargic-will barely open eyes-but will acknowledge my presence.  Objective: Vitals: Blood pressure (!) 162/83, pulse 96, temperature 98.3 F (36.8 C), resp. rate 17, height 5\' 8"  (1.727 m), weight 57.6 kg, SpO2 95%.   Exam: Gen Exam:-But not in any distress. HEENT:atraumatic, normocephalic Chest: B/L clear to auscultation anteriorly CVS:S1S2 regular Abdomen:soft non tender, non distended Extremities:no edema Neurology: Generalized weakness but nonfocal. Skin: no rash  Pertinent Labs/Radiology:    Latest Ref Rng & Units 06/12/2023    5:50 AM 06/11/2023    3:23 AM 06/10/2023    3:10 AM  CBC  WBC 4.0 - 10.5 K/uL 17.5  19.5  19.2   Hemoglobin 13.0 - 17.0 g/dL 57.8  46.9  62.9   Hematocrit 39.0 - 52.0 % 30.5  34.0  32.6   Platelets 150 - 400 K/uL 316  325  361     Lab Results  Component Value Date   NA 151 (H) 06/12/2023   K 5.3 (H) 06/12/2023   CL 106 06/12/2023   CO2 22 06/12/2023     Assessment/Plan: Acute CVA (hemorrhagic infarct) versus hemorrhagic contusion in the right frontal region Workup as above Remains encephalopathic due to severe AKI-difficult exam but suspect nonfocal.   Not on antiplatelets-on anticoagulation for aortic thrombus-with  renal artery occlusion and resultant renal infarct.  C6 vertebral body fracture Secondary to mechanical fall MRI-poor study-fracture not very visible. Evaluated by neurosurgery-hard collar removed on 9/22. Supportive  care  Oliguric AKI Multifactorial-ischemic, contrast nephropathy-as possible etiologies Evaluated by nephrology-not a HD candidate Renal function continues to worsen-remains oliguric Family hopeful for recovery but understand that he may not survive this hospitalization They are not yet ready to transition to comfort measures-he is a DNR. Avoid nephrotoxic agents Repeat electrolytes tomorrow  Hyperkalemia Secondary to AKI Continues to mild hyperkalemia-increasing Lokelma to 3 times daily dosing Repeat electrolytes tomorrow.    Acute metabolic encephalopathy Secondary to uremia Remains very lethargic-supportive care-see above  Oropharyngeal dysphagia Likely due to severe encephalopathy due to uremia After extensive discussion again on 10/9- Cortrak tube placed (see notes on 10/9)  Currently getting tube feeds  Hypernatremia Secondary to poor oral intake Continue free water flushes through NG tube-repeat electrolytes tomorrow.  Drug-induced liver injury Thought to be secondary to Unasyn LFTs downtrending  Acute urinary retention BPH Acute urinary retention likely due to BPH-Foley catheter placed by urology on 9/22 Not currently a candidate for voiding trial-severely encephalopathic Flomax/Finasteride if he is able to take any oral medications.  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  Palliative care DNR in place Very unfortunate elderly gentleman-continues to remain very encephalopathic-renal function continues to worsen.  Long discussion with son again on 10/10-he is aware that patient is receiving maximal medical treatment at this time-and there is really no role for any further escalation of care.  He remains oliguric-very encephalopathic with worsening renal function-family is fully aware that he will likely not survive this hospitalization.  Family  is aware of the recommendation to transition to full comfort measures/however they want to give it a few days-in hopes of a miraculous recovery.    Nutrition Status: Nutrition Problem: Severe Malnutrition Etiology: chronic illness Signs/Symptoms: severe fat depletion, severe muscle depletion, NPO status, percent weight loss, energy intake < or equal to 50% for > or equal to 5 days (8.5% x 30 days) Percent weight loss: 8.5 % Interventions: Refer to RD note for recommendations   Underweight: Estimated body mass index is 19.31 kg/m as calculated from the following:   Height as of this encounter: 5\' 8"  (1.727 m).   Weight as of this encounter: 57.6 kg.   Code status:   Code Status: Do not attempt resuscitation (DNR) PRE-ARREST INTERVENTIONS DESIRED   DVT Prophylaxis: SCDs Start: 05/22/23 2315 apixaban (ELIQUIS) tablet 5 mg    Family Communication: Son at bedside   Disposition Plan: Status is: Inpatient Remains inpatient appropriate because: Severity of illness   Planned Discharge Destination: Inpatient that versus residential hospice versus SNF   Diet: Diet Order             Diet NPO time specified  Diet effective now                     Antimicrobial agents: Anti-infectives (From admission, onward)    Start     Dose/Rate Route Frequency Ordered Stop   06/06/23 1000  meropenem (MERREM) 500 mg in sodium chloride 0.9 % 100 mL IVPB        500 mg 200 mL/hr over 30 Minutes Intravenous Every 24 hr x 2 06/05/23 1341 06/07/23 1232   06/03/23 0900  meropenem (MERREM) 500 mg in sodium chloride 0.9 % 100 mL IVPB  Status:  Discontinued        500 mg 200 mL/hr over 30 Minutes Intravenous Every 12 hours 06/03/23 0803 06/05/23 1341   05/26/23 1430  Ampicillin-Sulbactam (UNASYN) 3 g in sodium chloride 0.9 % 100 mL IVPB        3 g 200 mL/hr over 30 Minutes Intravenous Every 12 hours 05/26/23 1342 06/01/23 0824        MEDICATIONS: Scheduled Meds:  apixaban  5 mg Per Tube  BID   carvedilol  6.25 mg Per Tube BID WC   Chlorhexidine Gluconate Cloth  6 each Topical Daily   feeding supplement (PROSource TF20)  60 mL Per Tube Daily   finasteride  5 mg Oral Daily   free water  200 mL Per Tube Q8H   mirtazapine  15 mg Oral QHS   sodium zirconium cyclosilicate  10 g Per Tube TID   [START ON 06/13/2023] tamsulosin  0.4 mg Oral Daily   thiamine  100 mg Per Tube Daily   Continuous Infusions:  feeding supplement (OSMOLITE 1.5 CAL) 1,000 mL (06/11/23 1735)   PRN Meds:.acetaminophen **OR** acetaminophen, fentaNYL (SUBLIMAZE) injection, haloperidol lactate, hydrALAZINE, labetalol, LORazepam, melatonin, naLOXone (NARCAN)  injection, ondansetron (ZOFRAN) IV, polyethylene glycol   I have personally reviewed following labs and imaging studies  LABORATORY DATA: CBC: Recent Labs  Lab 06/08/23 0444 06/09/23 0319 06/10/23 0310 06/11/23 0323 06/12/23 0550  WBC 20.9* 21.6* 19.2* 19.5* 17.5*  HGB 12.1* 11.3* 11.5* 11.9* 10.6*  HCT 33.9* 32.2* 32.6* 34.0* 30.5*  MCV 75.8* 74.7* 74.9* 75.4* 75.5*  PLT 407* 394 361 325 316    Basic Metabolic Panel: Recent Labs  Lab 06/08/23 1105 06/09/23 0809 06/10/23 1623 06/11/23 0323 06/11/23 1538 06/12/23 0550 06/12/23 0551  NA 147* 148* 151* 151*  --   --  151*  K 5.1 5.6* 5.5* 5.4*  --   --  5.3*  CL 108 109 108 109  --   --  106  CO2 20* 21* 17* 21*  --   --  22  GLUCOSE 98 96 109* 109*  --   --  152*  BUN 143* 161* 187* 190*  --   --  215*  CREATININE 11.54* 12.79* 13.78* 14.57*  --   --  15.37*  CALCIUM 9.0 9.0 8.9 9.2  --   --  9.0  MG  --   --   --   --  3.5* 3.5*  --   PHOS  --   --   --   --  8.1* 8.2* 8.1*    GFR: Estimated Creatinine Clearance: 3 mL/min (A) (by C-G formula based on SCr of 15.37 mg/dL (H)).  Liver Function Tests: Recent Labs  Lab 06/08/23 1105 06/11/23 0323 06/12/23 0551  AST 626* 159*  --   ALT 275* 57*  --   ALKPHOS 221* 185*  --   BILITOT 1.5* 1.1  --   PROT 6.0* 5.9*  --    ALBUMIN <1.5* 1.5* 1.6*   No results for input(s): "LIPASE", "AMYLASE" in the last 168 hours. No results for input(s): "AMMONIA" in the last 168 hours.  Coagulation Profile: No results for input(s): "INR", "PROTIME" in the last 168 hours.   Cardiac Enzymes: No results for input(s): "CKTOTAL", "CKMB", "CKMBINDEX", "TROPONINI" in the last 168 hours.  BNP (last 3 results) No results for input(s): "PROBNP" in the last 8760 hours.  Lipid Profile: No results for input(s): "CHOL", "HDL", "LDLCALC", "TRIG", "CHOLHDL", "LDLDIRECT" in the last 72 hours.  Thyroid Function Tests: No results for input(s): "TSH", "T4TOTAL", "FREET4", "T3FREE", "THYROIDAB" in the last 72 hours.   Anemia Panel: No results for input(s): "VITAMINB12", "FOLATE", "FERRITIN", "TIBC", "IRON", "RETICCTPCT" in the last 72 hours.   Urine analysis:    Component Value Date/Time   COLORURINE YELLOW 05/26/2023 1044   APPEARANCEUR HAZY (A) 05/26/2023 1044   LABSPEC 1.014 05/26/2023 1044   PHURINE 5.0 05/26/2023 1044   GLUCOSEU 50 (A) 05/26/2023 1044   GLUCOSEU NEGATIVE 06/26/2022 0934   HGBUR LARGE (A) 05/26/2023 1044   HGBUR negative 06/01/2010 1023   BILIRUBINUR NEGATIVE 05/26/2023 1044   BILIRUBINUR n 08/15/2015 1530   KETONESUR 20 (A) 05/26/2023 1044   PROTEINUR 100 (A) 05/26/2023 1044   UROBILINOGEN 0.2 06/26/2022 0934   NITRITE NEGATIVE 05/26/2023 1044   LEUKOCYTESUR LARGE (A) 05/26/2023 1044    Sepsis Labs: Lactic Acid, Venous No results found for: "LATICACIDVEN"  MICROBIOLOGY: Recent Results (from the past 240 hour(s))  Varicella-zoster by PCR     Status: None   Collection Time: 06/02/23  1:52 PM   Specimen: Blood  Result Value Ref Range Status   Varicella-Zoster, PCR Negative Negative Final    Comment: (NOTE) No Varicella Zoster Virus DNA detected. This test was developed and its performance characteristics determined by [facility].  It has not been cleared or approved by the Food and Drug  Administration.  The FDA has determined that such clearance or approval is not necessary. Performed At: [facility] [address] [place], [place] [id number] Jolene Schimke MD WU:[id number]     RADIOLOGY STUDIES/RESULTS: DG Abd Portable 1V  Result Date: 06/11/2023 CLINICAL DATA:  Feeding tube placement. EXAM: PORTABLE ABDOMEN - 1 VIEW COMPARISON:  None Available. FINDINGS: Tip of the weighted enteric tube is below the diaphragm in the stomach in the left upper quadrant, in the region of the gastric cardia. IMPRESSION: Tip of the weighted enteric tube below the diaphragm in the stomach in the left upper quadrant, in the region of the gastric cardia. Assuming tube is to be used for feeding, recommend repositioning/advancement to place the tip in the more distal stomach. Electronically Signed   By: Narda Rutherford M.D.   On: 06/11/2023 16:28     LOS: 21 days   Jeoffrey Massed, MD  [facility]    To contact the attending provider between 7A-7P or the covering provider during after hours 7P-7A, please log into the web site [email or web address] and access using universal  password for that web site. If you do not have the password, please call the hospital operator.  06/12/2023, 12:20 PM

## 2023-06-12 NOTE — Progress Notes (Signed)
Nutrition Follow-up  DOCUMENTATION CODES:   Severe malnutrition in context of chronic illness  INTERVENTION:  Continue tube feeding via cortrak: Osmolite 1.5 at 45 ml/h (1080 ml per day) (Initiate 3ml/hr increase 10ml every 24hrs till goal rate is met.  Prosource TF20 60 ml 1 x day FWF every 8 hrs thiamine 100 mg daily x 7 days   Provides 1700 kcal, 88 gm protein, 1423 ml free water daily  Monitor magnesium, potassium, and phosphorus BID for at least 3 days, MD to replete as needed, as pt is at risk for refeeding syndrome given NPO x 7 days decreased oral intake x 3 weeks.   NUTRITION DIAGNOSIS:   Severe Malnutrition related to chronic illness as evidenced by severe fat depletion, severe muscle depletion, NPO status, percent weight loss, energy intake < or equal to 50% for > or equal to 5 days (8.5% x 30 days).    GOAL:   Patient will meet greater than or equal to 90% of their needs    MONITOR:   Labs, Weight trends, Other (Comment) (goals of care)  REASON FOR ASSESSMENT:   NPO/Clear Liquid Diet, Malnutrition Screening Tool    ASSESSMENT:   82 y.o.  male with history of CVA, HTN-who sustained a mechanical fall at home-upon further evaluation-was found to have acute CVA and C6 vertebral body fracture.  Patient was initially evaluated at [facility] and transferred to [facility] service at [facility] for further evaluation. 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 Family decided to go ahead with cortrak which was placed yesterday. Current EN running at 76ml/hr and will be advanced by 10ml  at 1700, per order. RN reports tolerating at this time.  Abnormal labs which can be expected for end of life. Dr. Rondell Reams these in rounds this a.m.      NUTRITION - FOCUSED PHYSICAL  EXAM:  Flowsheet Row Most Recent Value  Orbital Region Severe depletion  Upper Arm Region Unable to assess  [Pt restrained]  Thoracic and Lumbar Region Severe depletion  Buccal Region Severe depletion  Temple Region Severe depletion  Clavicle Bone Region Severe depletion  Clavicle and Acromion Bone Region Severe depletion  Scapular Bone Region Unable to assess  Dorsal Hand Unable to assess  Patellar Region Severe depletion  Anterior Thigh Region Severe depletion  Posterior Calf Region Severe depletion  Edema (RD Assessment) None  Hair Reviewed  Eyes Unable to assess  Mouth Unable to assess  Skin Reviewed  Nails Unable to assess       Diet Order:   Diet Order             Diet NPO time specified  Diet effective now                   EDUCATION NEEDS:   Not appropriate for education at this time  Skin:  Skin Assessment: Skin Integrity Issues: Skin Integrity Issues:: Stage I Stage I: Back lateral, Vertebral colum lower  Last BM:  10/05  Height:   Ht Readings from Last 1 Encounters:  05/22/23 5\' 8"  (1.727 m)    Weight:   Wt Readings from Last 1 Encounters:  06/04/23 57.6 kg    Ideal Body Weight:     BMI:  Body mass index is 19.31 kg/m.  Estimated Nutritional Needs:   Kcal:  1700-2000 kcal/day  Protein:  75-90 g/day  Fluid:  1700-2000 ml/day    Jamelle Haring RDN, LDN Clinical Dietitian  RDN pager # available on Amion

## 2023-06-12 NOTE — Plan of Care (Signed)
  Problem: Education: Goal: Knowledge of disease or condition will improve Outcome: Progressing Goal: Knowledge of secondary prevention will improve (MUST DOCUMENT ALL) Outcome: Progressing Goal: Knowledge of patient specific risk factors will improve Loraine Leriche N/A or DELETE if not current risk factor) Outcome: Progressing   Problem: Ischemic Stroke/TIA Tissue Perfusion: Goal: Complications of ischemic stroke/TIA will be minimized Outcome: Progressing   Problem: Coping: Goal: Will verbalize positive feelings about self Outcome: Progressing Goal: Will identify appropriate support needs Outcome: Progressing   Problem: Health Behavior/Discharge Planning: Goal: Ability to manage health-related needs will improve Outcome: Progressing Goal: Goals will be collaboratively established with patient/family Outcome: Progressing   Problem: Nutrition: Goal: Risk of aspiration will decrease Outcome: Progressing Goal: Dietary intake will improve Outcome: Progressing   Problem: Education: Goal: Knowledge of General Education information will improve Description: Including pain rating scale, medication(s)/side effects and non-pharmacologic comfort measures Outcome: Progressing   Problem: Health Behavior/Discharge Planning: Goal: Ability to manage health-related needs will improve Outcome: Progressing   Problem: Clinical Measurements: Goal: Ability to maintain clinical measurements within normal limits will improve Outcome: Progressing Goal: Will remain free from infection Outcome: Progressing Goal: Diagnostic test results will improve Outcome: Progressing Goal: Respiratory complications will improve Outcome: Progressing Goal: Cardiovascular complication will be avoided Outcome: Progressing   Problem: Activity: Goal: Risk for activity intolerance will decrease Outcome: Progressing   Problem: Nutrition: Goal: Adequate nutrition will be maintained Outcome: Progressing   Problem:  Coping: Goal: Level of anxiety will decrease Outcome: Progressing   Problem: Elimination: Goal: Will not experience complications related to bowel motility Outcome: Progressing Goal: Will not experience complications related to urinary retention Outcome: Progressing   Problem: Pain Managment: Goal: General experience of comfort will improve Outcome: Progressing   Problem: Safety: Goal: Ability to remain free from injury will improve Outcome: Progressing   Problem: Skin Integrity: Goal: Risk for impaired skin integrity will decrease Outcome: Progressing

## 2023-06-13 DIAGNOSIS — E43 Unspecified severe protein-calorie malnutrition: Secondary | ICD-10-CM

## 2023-06-13 DIAGNOSIS — G9340 Encephalopathy, unspecified: Secondary | ICD-10-CM | POA: Diagnosis not present

## 2023-06-13 DIAGNOSIS — I639 Cerebral infarction, unspecified: Secondary | ICD-10-CM | POA: Diagnosis not present

## 2023-06-13 DIAGNOSIS — K719 Toxic liver disease, unspecified: Secondary | ICD-10-CM | POA: Diagnosis not present

## 2023-06-13 DIAGNOSIS — I10 Essential (primary) hypertension: Secondary | ICD-10-CM | POA: Diagnosis not present

## 2023-06-13 LAB — MAGNESIUM: Magnesium: 3.5 mg/dL — ABNORMAL HIGH (ref 1.7–2.4)

## 2023-06-13 LAB — RENAL FUNCTION PANEL
Albumin: 1.5 g/dL — ABNORMAL LOW (ref 3.5–5.0)
Anion gap: 23 — ABNORMAL HIGH (ref 5–15)
BUN: 233 mg/dL — ABNORMAL HIGH (ref 8–23)
CO2: 21 mmol/L — ABNORMAL LOW (ref 22–32)
Calcium: 8.6 mg/dL — ABNORMAL LOW (ref 8.9–10.3)
Chloride: 107 mmol/L (ref 98–111)
Creatinine, Ser: 16.16 mg/dL — ABNORMAL HIGH (ref 0.61–1.24)
GFR, Estimated: 3 mL/min — ABNORMAL LOW (ref >60)
Glucose, Bld: 140 mg/dL — ABNORMAL HIGH (ref 70–99)
Phosphorus: 7.8 mg/dL — ABNORMAL HIGH (ref 2.5–4.6)
Potassium: 4.9 mmol/L (ref 3.5–5.1)
Sodium: 151 mmol/L — ABNORMAL HIGH (ref 135–145)

## 2023-06-13 LAB — CBC
HCT: 29 % — ABNORMAL LOW (ref 39.0–52.0)
Hemoglobin: 9.9 g/dL — ABNORMAL LOW (ref 13.0–17.0)
MCH: 26.1 pg (ref 26.0–34.0)
MCHC: 34.1 g/dL (ref 30.0–36.0)
MCV: 76.3 fL — ABNORMAL LOW (ref 80.0–100.0)
Platelets: 268 10*3/uL (ref 150–400)
RBC: 3.8 MIL/uL — ABNORMAL LOW (ref 4.22–5.81)
RDW: 14.5 % (ref 11.5–15.5)
WBC: 18.8 10*3/uL — ABNORMAL HIGH (ref 4.0–10.5)
nRBC: 0 % (ref 0.0–0.2)

## 2023-06-13 LAB — GLUCOSE, CAPILLARY
Glucose-Capillary: 134 mg/dL — ABNORMAL HIGH (ref 70–99)
Glucose-Capillary: 153 mg/dL — ABNORMAL HIGH (ref 70–99)
Glucose-Capillary: 154 mg/dL — ABNORMAL HIGH (ref 70–99)
Glucose-Capillary: 166 mg/dL — ABNORMAL HIGH (ref 70–99)
Glucose-Capillary: 167 mg/dL — ABNORMAL HIGH (ref 70–99)
Glucose-Capillary: 167 mg/dL — ABNORMAL HIGH (ref 70–99)
Glucose-Capillary: 175 mg/dL — ABNORMAL HIGH (ref 70–99)

## 2023-06-13 NOTE — Plan of Care (Signed)
°  Problem: Self-Care: Goal: Ability to participate in self-care as condition permits will improve Outcome: Not Progressing Goal: Ability to communicate needs accurately will improve Outcome: Not Progressing

## 2023-06-13 NOTE — Progress Notes (Signed)
Daily Progress Note   Patient Name: Eric [name]       Date: 06/13/2023 DOB: 03-30-1941  Age: 82 y.o. MRN#: [id number] Attending Physician: Maretta Bees, MD Primary Care Physician: Tresa Garter, MD Admit Date: 05/22/2023  Reason for Consultation/Follow-up: Establishing goals of care  Subjective: I have reviewed medical records including progress notes, MAR, and labs/imaging. Discussed with MD. Patient assessed at the bedside and is unresponsive, occasionally grimacing. His son Eric [name] is present at the bedside.  MD provided medical update and reiterated concerns about patient's worsening condition, unfortunately at EOL in hours to days despite ongoing medical interventions. Emotional support and therapeutic listening was provided as patient's son tried to navigate decision-making in the space of including his siblings as well. Reviewed appropriateness of transition to comfort focused care and residential hospice vs home with hospice, depending on patient's wishes and family's ability to support him at EOL.   Eric [name] feels confident that they would be able to care for him at home. Reviewed the hospice referral process and importance of making arrangements while he is still stable for transportation. Encouraged beginning hospice referral process earlier in the day and finalizing after other 2 children are able to input their thoughts. Eric [name] would prefer to proceed after a 12pm meeting, when his aunt and uncle will be visiting. Offered to be present for support and facilitation of next steps at that time.   I then received a voicemail from patient's daughter Eric [name] requesting the 12pm meeting be rescheduled to 3:15pm or later. I returned her call to inform her that I would not be  available at that time and emphasized the importance of making decisions as soon as possible, given that patient may not still be stable for transportation later in the day. Provided update on the above conversation with patient's son. She states that they are all in agreement with preference to take patient home with hospice, however they still need to discuss whether they would be ok with taking patient off monitoring to do so. She understands cortrak would not be continued in the home setting. Gently counseled that monitoring will not ultimately impact his prognosis and that he will soon pass away in the hospital if they are not clear on when to initiate transfer to his preferred final resting place.  Provided patient's daughter  with TOC contact information and encouraged to inform team of their final decision as soon as they are able (prior to 5pm). Explained that transportation may need to be revisited in the morning if family remains undecided after that time. She understands.  Encouraged to call with questions and/or concerns. PMT card previously provided.  Length of Stay: 22  Physical Exam Vitals and nursing note reviewed.  Constitutional:      General: He is not in acute distress.    Appearance: He is ill-appearing.     Comments: Cortrak in place  Cardiovascular:     Rate and Rhythm: Normal rate.  Pulmonary:     Effort: No respiratory distress.  Skin:    General: Skin is warm and dry.  Neurological:     Mental Status: He is unresponsive.            Vital Signs: BP 131/69   Pulse 83   Temp 97.8 F (36.6 C) (Oral)   Resp 11   Ht 5\' 8"  (1.727 m)   Wt 57.6 kg   SpO2 94%   BMI 19.31 kg/m  SpO2: SpO2: 94 % O2 Device: O2 Device: Room Air O2 Flow Rate: O2 Flow Rate (L/min): 0.5 L/min       Palliative Assessment/Data: PPS 10%   Palliative Care Assessment & Plan   Patient Profile: 82 y.o. male  with past medical history of stroke, essential pretension, who was admitted to  [facility] on 05/22/2023 with suspected acute versus subacute ischemic stroke after presenting from home to [facility] ED for evaluation of fall.   Assessment: Principal Problem:   Acute ischemic stroke [facility]) Active Problems:   Essential hypertension   Acute intracranial hemorrhage (HCC)   Acute encephalopathy   C6 cervical fracture (HCC)   Elevated troponin   BPH (benign prostatic hyperplasia)   Elevated liver enzymes   Drug-induced liver injury   Protein-calorie malnutrition, severe   Concern about end of life  Recommendations/Plan: Continue DNR/DNI Continue current care Patient's son understands that he is quickly approaching EOL. Initial plan was to meet at 12pm to discuss arranging hospice at home, however family prefers to meet after 3:15pm for further discussion and then inform medical team of a final decision Family is leaning towards home with hospice if patient remains stable at time of final decision Ongoing GOC discussions  Psychosocial and emotional support provided PMT will continue to follow and support holistically   Prognosis:  Hours to Days  Discharge Planning: To Be Determined  Care plan was discussed with patient's son and daughter, MD, TOC   MDM: High  Nani Ingram Loleta Rose Palliative Medicine Team Team phone # [phone]  Thank you for allowing the Palliative Medicine Team to assist in the care of this patient. Please utilize secure chat with additional questions, if there is no response within 30 minutes please call the above phone number.  Palliative Medicine Team providers are available by phone from 7am to 7pm daily and can be reached through the team cell phone.  Should this patient require assistance outside of these hours, please call the patient's attending physician.  Portions of this note are a verbal dictation therefore any spelling and/or grammatical errors are due to the "[employer] One" system interpretation.

## 2023-06-13 NOTE — TOC Transition Note (Addendum)
Transition of Care [facility]) - CM/SW Discharge Note   Patient Details  Name: Eric [name] MRN: [id number] Date of Birth: 1941-07-20  Transition of Care [facility]) CM/SW Contact:  Lockie Pares, RN Phone Number: 06/13/2023, 4:09 PM   Clinical Narrative:     Received a call from Daughter Ms Yetta Barre. Her brother is also with her. They would like to take the patient home on hospice. Discussed agency, they would like a highly rated hospice,  Messaged with Shawn from [facility] he will be calling daughter. Provided number. Messaged with team ( MD, Nursing, Palliative) to let them know of family decision.  They do not feel they need equipment. 1700 touched base with hospice [facility] they have  spoken to family ,ordered bed for home , and likely will be tomorrow for transport home.  Final next level of care: Home w Hospice Care Barriers to Discharge: No Barriers Identified   Patient Goals and CMS Choice CMS Medicare.gov Compare Post Acute Care list provided to:: Patient Represenative (must comment) Choice offered to / list presented to : Adult Children  Discharge Placement      Home  with hospice          Discharge Plan and Services Additional resources added to the After Visit Summary for   In-house Referral: Clinical Social Work                                   Social Determinants of Health (SDOH) Interventions SDOH Screenings   Food Insecurity: No Food Insecurity (05/30/2023)  Housing: Low Risk  (05/30/2023)  Transportation Needs: No Transportation Needs (05/30/2023)  Utilities: Not At Risk (05/30/2023)  Depression (PHQ2-9): Low Risk  (04/17/2023)  Tobacco Use: Medium Risk (05/22/2023)     Readmission Risk Interventions     No data to display

## 2023-06-13 NOTE — Progress Notes (Signed)
PROGRESS NOTE        PATIENT DETAILS Name: Eric [name] Age: 82 y.o. Sex: male Date of Birth: 1941/04/08 Admit Date: 05/22/2023 Admitting Physician Angie Fava, DO WUJ:[id number], Georgina Quint, MD  Brief Summary: Patient is a 82 y.o.  male with history of CVA, HTN-who sustained a mechanical fall at home-upon further evaluation-was found to have acute CVA and C6 vertebral body fracture.  Patient was initially evaluated at [facility] and transferred to [facility] service at [facility] for further evaluation.  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  Evaluated by nephrology-not thought to be a dialysis candidate-palliative care following-made DNR-but family still wanting full scope of care.  See below for further details.  Significant events: 9/19>> admit to [facility]  Significant studies: 6/23>> A1c: 5.8 9/19>> CT head: 0.9 cm focus of intraparenchymal hemorrhage right frontal lobe.  Acute to subacute cortical infarct left parietal lobe. 9/19 >>CT C-spine: Acute nondisplaced fracture through the anterior inferior corner of C6 vertebral body. 9/20>> CT head: Stable 1 cm anterior right middle frontal gyrus hemorrhagic contusion versus trace subarachnoid blood. 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 9/20>> MRI brain: Acute infarct anterior right frontal lobe. 9/20>> echo: EF 55-60% 9/20>> LDL: 112 9/21>> MRI C-spine: Known fracture of C1 is not well-seen on the study. 9/23>> CT chest/abdomen/pelvis: Nonocclusive partially drained thrombus in the mid abdominal aorta-which occludes the ostium of the right renal artery.  Extensive wedge-shaped  at areas of the right kidney-compatible with renal infarction.  Bilateral pneumonia. 9/24>> CT head: Mild increase in [facility] 9/25>> MRI C-spine: Motion degraded study-tiny fracture of C6 vertebral body, marrow edema of the C5 spinous process. 9/27>> RUQ ultrasound: Unremarkable  Significant microbiology data: 9/23>> urine culture: No growth 9/23>> blood culture: No growth  Procedures: None  Consults: Neurology Nephrology Vascular surgery Neurosurgery Urology Palliative care  Subjective: More lethargic today-intermittently gasping at times but not in obvious discomfort.  Objective: Vitals: Blood pressure (!) 156/70, pulse 93, temperature 98 F (36.7 C), temperature source Axillary, resp. rate (!) 32, height 5\' 8"  (1.727 m), weight 57.6 kg, SpO2 95%.   Exam: Gen Exam: Barely responsive HEENT:atraumatic, normocephalic Chest: B/L clear to auscultation anteriorly CVS:S1S2 regular Abdomen:soft non tender, non distended Extremities:no edema Skin: no rash  Pertinent Labs/Radiology:    Latest Ref Rng & Units 06/13/2023    3:44 AM 06/12/2023    5:50 AM 06/11/2023    3:23 AM  CBC  WBC 4.0 - 10.5 K/uL 18.8  17.5  19.5   Hemoglobin 13.0 - 17.0 g/dL 9.9  95.6  21.3   Hematocrit 39.0 - 52.0 % 29.0  30.5  34.0   Platelets 150 - 400 K/uL 268  316  325     Lab Results  Component Value Date   NA 151 (H) 06/13/2023   K 4.9 06/13/2023   CL 107 06/13/2023   CO2 21 (L) 06/13/2023     Assessment/Plan: Acute CVA (hemorrhagic infarct) versus hemorrhagic contusion in the right frontal region Workup as above Remains encephalopathic due to severe AKI-difficult exam but suspect nonfocal.   Not on antiplatelets-on anticoagulation for aortic thrombus-with renal  artery occlusion and resultant renal infarct.  C6 vertebral body fracture Secondary to mechanical fall MRI-poor study-fracture not very visible. Evaluated by neurosurgery-hard collar removed on 9/22. Supportive care  Oliguric  AKI Multifactorial-ischemic, contrast nephropathy-as possible etiologies Evaluated by nephrology-not a HD candidate Unfortunately-renal function continues to worsen-he is oliguric Potassium stable but on Lokelma 3 times daily.   Actively dying at this point-multiple discussions on 10/11-with son at bedside-along with the palliative care team-family meeting at 12 noon-have recommended hospice care-family tentatively leaning to take him home with hospice.  Hyperkalemia Secondary to AKI Stable today but on Lokelma.  Acute metabolic encephalopathy Secondary to uremia Remains very lethargic-supportive care-see above  Oropharyngeal dysphagia Likely due to severe encephalopathy due to uremia After extensive discussion again on 10/9- Cortrak tube placed (see notes on 10/9)  Currently getting tube feeds  Hypernatremia Secondary to poor oral intake Continue free water flushes through NG tube-repeat electrolytes tomorrow.  Drug-induced liver injury Thought to be secondary to Unasyn LFTs downtrending  Acute urinary retention BPH Acute urinary retention likely due to BPH-Foley catheter placed by urology on 9/22 Not currently a candidate for voiding trial-severely encephalopathic Flomax/Finasteride if he is able to take any oral medications.  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  Palliative care DNR in place Multiple questions of the past several days Discussed with son at bedside on 10/11-initially by myself-and then again with palliative care as well-basically at this point patient appears to be actively dying-family has been repeatedly told that there is really no further role for any escalation in care-and that all interventions so far have not worked including NG tube placement.  Strongly recommended that we initiate comfort measures.  Son at bedside-leaning  towards getting patient home with hospice care-they have a family meeting at 12 noon.   Nutrition Status: Nutrition Problem: Severe Malnutrition Etiology: chronic illness Signs/Symptoms: severe fat depletion, severe muscle depletion, NPO status, percent weight loss, energy intake < or equal to 50% for > or equal to 5 days (8.5% x 30 days) Percent weight loss: 8.5 % Interventions: Refer to RD note for recommendations   Underweight: Estimated body mass index is 19.31 kg/m as calculated from the following:   Height as of this encounter: 5\' 8"  (1.727 m).   Weight as of this encounter: 57.6 kg.   Code status:   Code Status: Limited: Do not attempt resuscitation (DNR) -DNR-LIMITED -Do Not Intubate/DNI    DVT Prophylaxis: SCDs Start: 05/22/23 2315 apixaban (ELIQUIS) tablet 5 mg    Family Communication: Son at bedside   Disposition Plan: Status is: Inpatient Remains inpatient appropriate because: Severity of illness   Planned Discharge Destination: Inpatient that versus residential hospice versus SNF   Diet: Diet Order             Diet NPO time specified  Diet effective now                     Antimicrobial agents: Anti-infectives (From admission, onward)    Start     Dose/Rate Route Frequency Ordered Stop   06/06/23 1000  meropenem (MERREM) 500 mg in sodium chloride 0.9 % 100 mL IVPB        500 mg 200 mL/hr over 30 Minutes Intravenous Every 24 hr x 2 06/05/23 1341 06/07/23 1232   06/03/23 0900  meropenem (MERREM) 500 mg in sodium chloride 0.9 % 100 mL IVPB  Status:  Discontinued        500 mg 200 mL/hr over 30 Minutes Intravenous Every 12 hours 06/03/23 0803 06/05/23 1341   05/26/23 1430  Ampicillin-Sulbactam (UNASYN) 3 g in sodium chloride 0.9 % 100 mL IVPB        3 g 200 mL/hr over 30 Minutes Intravenous Every 12 hours 05/26/23 1342 06/01/23 0824        MEDICATIONS: Scheduled Meds:  apixaban  5 mg Per Tube BID   carvedilol  6.25 mg Per Tube BID WC    Chlorhexidine Gluconate Cloth  6 each Topical Daily   feeding supplement (PROSource TF20)  60 mL Per Tube Daily   finasteride  5 mg Oral Daily   free water  200 mL Per Tube Q8H   mirtazapine  15 mg Oral QHS   sodium zirconium cyclosilicate  10 g Per Tube TID   tamsulosin  0.4 mg Oral Daily   thiamine  100 mg Per Tube Daily   Continuous Infusions:  feeding supplement (OSMOLITE 1.5 CAL) 1,000 mL (06/11/23 1735)   PRN Meds:.acetaminophen **OR** acetaminophen, fentaNYL (SUBLIMAZE) injection, haloperidol lactate, hydrALAZINE, labetalol, LORazepam, melatonin, naLOXone (NARCAN)  injection, ondansetron (ZOFRAN) IV, polyethylene glycol   I have personally reviewed following labs and imaging studies  LABORATORY DATA: CBC: Recent Labs  Lab 06/09/23 0319 06/10/23 0310 06/11/23 0323 06/12/23 0550 06/13/23 0344  WBC 21.6* 19.2* 19.5* 17.5* 18.8*  HGB 11.3* 11.5* 11.9* 10.6* 9.9*  HCT 32.2* 32.6* 34.0* 30.5* 29.0*  MCV 74.7* 74.9* 75.4* 75.5* 76.3*  PLT 394 361 325 316 268    Basic Metabolic Panel: Recent Labs  Lab 06/09/23 0809 06/10/23 1623 06/11/23 0323 06/11/23 1538 06/12/23 0550 06/12/23 0551 06/13/23 0343  NA 148* 151* 151*  --   --  151* 151*  K 5.6* 5.5* 5.4*  --   --  5.3* 4.9  CL 109 108 109  --   --  106 107  CO2 21* 17* 21*  --   --  22 21*  GLUCOSE 96 109* 109*  --   --  152* 140*  BUN 161* 187* 190*  --   --  215* 233*  CREATININE 12.79* 13.78* 14.57*  --   --  15.37* 16.16*  CALCIUM 9.0 8.9 9.2  --   --  9.0 8.6*  MG  --   --   --  3.5* 3.5*  --  3.5*  PHOS  --   --   --  8.1* 8.2* 8.1* 7.8*    GFR: Estimated Creatinine Clearance: 2.9 mL/min (A) (by C-G formula based on SCr of 16.16 mg/dL (H)).  Liver Function Tests: Recent Labs  Lab 06/08/23 1105 06/11/23 0323 06/12/23 0551 06/13/23 0343  AST 626* 159*  --   --   ALT 275* 57*  --   --   ALKPHOS 221* 185*  --   --   BILITOT 1.5* 1.1  --   --   PROT 6.0* 5.9*  --   --   ALBUMIN <1.5* 1.5* 1.6* 1.5*    No results for input(s): "LIPASE", "AMYLASE" in the last 168 hours. No results for input(s): "AMMONIA" in the last 168 hours.  Coagulation Profile: No results for input(s): "INR", "PROTIME" in the last 168 hours.   Cardiac Enzymes: No results for input(s): "CKTOTAL", "CKMB", "CKMBINDEX", "TROPONINI" in the last 168 hours.  BNP (last 3 results) No results for input(s): "PROBNP" in the last 8760 hours.  Lipid Profile: No results for input(s): "CHOL", "HDL", "  LDLCALC", "TRIG", "CHOLHDL", "LDLDIRECT" in the last 72 hours.   Thyroid Function Tests: No results for input(s): "TSH", "T4TOTAL", "FREET4", "T3FREE", "THYROIDAB" in the last 72 hours.   Anemia Panel: No results for input(s): "VITAMINB12", "FOLATE", "FERRITIN", "TIBC", "IRON", "RETICCTPCT" in the last 72 hours.   Urine analysis:    Component Value Date/Time   COLORURINE YELLOW 05/26/2023 1044   APPEARANCEUR HAZY (A) 05/26/2023 1044   LABSPEC 1.014 05/26/2023 1044   PHURINE 5.0 05/26/2023 1044   GLUCOSEU 50 (A) 05/26/2023 1044   GLUCOSEU NEGATIVE 06/26/2022 0934   HGBUR LARGE (A) 05/26/2023 1044   HGBUR negative 06/01/2010 1023   BILIRUBINUR NEGATIVE 05/26/2023 1044   BILIRUBINUR n 08/15/2015 1530   KETONESUR 20 (A) 05/26/2023 1044   PROTEINUR 100 (A) 05/26/2023 1044   UROBILINOGEN 0.2 06/26/2022 0934   NITRITE NEGATIVE 05/26/2023 1044   LEUKOCYTESUR LARGE (A) 05/26/2023 1044    Sepsis Labs: Lactic Acid, Venous No results found for: "LATICACIDVEN"  MICROBIOLOGY: No results found for this or any previous visit (from the past 240 hour(s)).   RADIOLOGY STUDIES/RESULTS: DG Abd Portable 1V  Result Date: 06/11/2023 CLINICAL DATA:  Feeding tube placement. EXAM: PORTABLE ABDOMEN - 1 VIEW COMPARISON:  None Available. FINDINGS: Tip of the weighted enteric tube is below the diaphragm in the stomach in the left upper quadrant, in the region of the gastric cardia. IMPRESSION: Tip of the weighted enteric tube below the  diaphragm in the stomach in the left upper quadrant, in the region of the gastric cardia. Assuming tube is to be used for feeding, recommend repositioning/advancement to place the tip in the more distal stomach. Electronically Signed   By: Narda Rutherford M.D.   On: 06/11/2023 16:28     LOS: 22 days   Jeoffrey Massed, MD  [facility]    To contact the attending provider between 7A-7P or the covering provider during after hours 7P-7A, please log into the web site [email or web address] and access using universal [place] password for that web site. If you do not have the password, please call the hospital operator.  06/13/2023, 10:35 AM

## 2023-06-14 ENCOUNTER — Other Ambulatory Visit (HOSPITAL_COMMUNITY): Payer: Self-pay

## 2023-06-14 DIAGNOSIS — G9340 Encephalopathy, unspecified: Secondary | ICD-10-CM | POA: Diagnosis not present

## 2023-06-14 DIAGNOSIS — I639 Cerebral infarction, unspecified: Secondary | ICD-10-CM | POA: Diagnosis not present

## 2023-06-14 DIAGNOSIS — K719 Toxic liver disease, unspecified: Secondary | ICD-10-CM | POA: Diagnosis not present

## 2023-06-14 DIAGNOSIS — S0636AA Traumatic hemorrhage of cerebrum, unspecified, with loss of consciousness status unknown, initial encounter: Secondary | ICD-10-CM | POA: Diagnosis not present

## 2023-06-14 LAB — RENAL FUNCTION PANEL
Albumin: 1.6 g/dL — ABNORMAL LOW (ref 3.5–5.0)
Anion gap: 24 — ABNORMAL HIGH (ref 5–15)
BUN: 257 mg/dL — ABNORMAL HIGH (ref 8–23)
CO2: 22 mmol/L (ref 22–32)
Calcium: 8.6 mg/dL — ABNORMAL LOW (ref 8.9–10.3)
Chloride: 105 mmol/L (ref 98–111)
Creatinine, Ser: 16.86 mg/dL — ABNORMAL HIGH (ref 0.61–1.24)
GFR, Estimated: 3 mL/min — ABNORMAL LOW (ref >60)
Glucose, Bld: 177 mg/dL — ABNORMAL HIGH (ref 70–99)
Phosphorus: 7.4 mg/dL — ABNORMAL HIGH (ref 2.5–4.6)
Potassium: 4.4 mmol/L (ref 3.5–5.1)
Sodium: 151 mmol/L — ABNORMAL HIGH (ref 135–145)

## 2023-06-14 LAB — GLUCOSE, CAPILLARY
Glucose-Capillary: 190 mg/dL — ABNORMAL HIGH (ref 70–99)
Glucose-Capillary: 192 mg/dL — ABNORMAL HIGH (ref 70–99)
Glucose-Capillary: 160 mg/dL — ABNORMAL HIGH (ref 70–99)

## 2023-06-14 MED ORDER — LORAZEPAM 1 MG PO TABS
1.0000 mg | ORAL_TABLET | Freq: Four times a day (QID) | ORAL | 0 refills | Status: AC | PRN
  Filled 2023-06-14: qty 15, 4d supply, fill #0

## 2023-06-14 MED ORDER — OXYCODONE HCL 5 MG/5ML PO SOLN
5.0000 mg | Freq: Four times a day (QID) | ORAL | 0 refills | Status: AC | PRN
Start: 2023-06-14 — End: ?
  Filled 2023-06-14: qty 30, 1d supply, fill #0

## 2023-06-14 NOTE — Discharge Summary (Signed)
PATIENT DETAILS Name: Eric [name] Age: 82 y.o. Sex: male Date of Birth: 1941/06/18 MRN: [id number]. Admitting Physician: Angie Fava, DO QMV:HQIONGEXB, Georgina Quint, MD  Admit Date: 05/22/2023 Discharge date: 06/14/2023  Recommendations for Outpatient Follow-up:  Optimize comfort measures  Admitted From:  Home  Disposition: Hospice care   Discharge Condition: poor  CODE STATUS:   Code Status: Limited: Do not attempt resuscitation (DNR) -DNR-LIMITED -Do Not Intubate/DNI    Diet recommendation:  Diet Order             Diet NPO time specified  Diet effective now                    Brief Summary: Patient is a 82 y.o.  male with history of CVA, HTN-who sustained a mechanical fall at home-upon further evaluation-was found to have acute CVA and C6 vertebral body fracture.  Patient was initially evaluated at [facility] and transferred to [facility] service at [facility] for further evaluation.  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  Evaluated by nephrology-not thought to be a dialysis candidate-palliative care following-made DNR-but family still wanting full scope of care.  See below for further details.   Significant events: 9/19>> admit to [facility]   Significant studies: 6/23>> A1c: 5.8 9/19>> CT head: 0.9 cm focus of intraparenchymal hemorrhage right frontal lobe.  Acute to subacute cortical infarct left parietal lobe. 9/19 >>CT C-spine: Acute nondisplaced fracture through the anterior inferior corner of C6 vertebral body. 9/20>> CT head: Stable 1 cm anterior right middle frontal gyrus hemorrhagic contusion versus trace subarachnoid blood. 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 9/20>> MRI brain: Acute infarct anterior right frontal lobe. 9/20>> echo: EF 55-60% 9/20>> LDL: 112 9/21>> MRI C-spine: Known fracture of C1 is not well-seen on the study. 9/23>> CT chest/abdomen/pelvis: Nonocclusive partially drained thrombus in the mid abdominal aorta-which occludes the ostium of the right renal artery.  Extensive wedge-shaped at areas of the right kidney-compatible with renal infarction.  Bilateral pneumonia. 9/24>> CT head: Mild increase in [facility]. 9/25>> MRI C-spine: Motion degraded study-tiny fracture of C6 vertebral body, marrow edema of the C5 spinous process. 9/27>> RUQ ultrasound: Unremarkable   Significant microbiology data: 9/23>> urine culture: No growth 9/23>> blood culture: No growth   Procedures: None   Consults: Neurology Nephrology Vascular surgery Neurosurgery Urology Palliative care  Brief Hospital Course: Acute CVA (hemorrhagic infarct) versus hemorrhagic contusion in the right frontal region Workup as above Remains encephalopathic due to severe AKI-difficult exam but suspect nonfocal.   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 Very encephalopathic-not possible to continue anticoagulation on discharge-very limited life expectancy-plans are for home hospice on discharge.   C6 vertebral body fracture Secondary to mechanical fall MRI-poor study-fracture not very visible. Evaluated by neurosurgery-hard collar removed on 9/22.   Oliguric AKI Multifactorial-ischemic, contrast nephropathy-as possible etiologies Evaluated by nephrology-not a HD candidate Unfortunately-renal function continues to worsen-he is oliguric-and severely encephalopathic.  Potassium controlled with Lokelma Extensive discussion with family by this MD-prior MDs and by the palliative care team-at this point it is felt that he is actively  dying-severely encephalopathic-after another family discussion yesterday-they have decided to take him home with hospice care.  PATIENT DETAILS Name: Eric [name] Age: 82 y.o. Sex: male Date of Birth: 1941/06/18 MRN: [id number]. Admitting Physician: Angie Fava, DO QMV:HQIONGEXB, Georgina Quint, MD  Admit Date: 05/22/2023 Discharge date: 06/14/2023  Recommendations for Outpatient Follow-up:  Optimize comfort measures  Admitted From:  Home  Disposition: Hospice care   Discharge Condition: poor  CODE STATUS:   Code Status: Limited: Do not attempt resuscitation (DNR) -DNR-LIMITED -Do Not Intubate/DNI    Diet recommendation:  Diet Order             Diet NPO time specified  Diet effective now                    Brief Summary: Patient is a 82 y.o.  male with history of CVA, HTN-who sustained a mechanical fall at home-upon further evaluation-was found to have acute CVA and C6 vertebral body fracture.  Patient was initially evaluated at [facility] and transferred to [facility] service at [facility] for further evaluation.  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  Evaluated by nephrology-not thought to be a dialysis candidate-palliative care following-made DNR-but family still wanting full scope of care.  See below for further details.   Significant events: 9/19>> admit to [facility]   Significant studies: 6/23>> A1c: 5.8 9/19>> CT head: 0.9 cm focus of intraparenchymal hemorrhage right frontal lobe.  Acute to subacute cortical infarct left parietal lobe. 9/19 >>CT C-spine: Acute nondisplaced fracture through the anterior inferior corner of C6 vertebral body. 9/20>> CT head: Stable 1 cm anterior right middle frontal gyrus hemorrhagic contusion versus trace subarachnoid blood. 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 9/20>> MRI brain: Acute infarct anterior right frontal lobe. 9/20>> echo: EF 55-60% 9/20>> LDL: 112 9/21>> MRI C-spine: Known fracture of C1 is not well-seen on the study. 9/23>> CT chest/abdomen/pelvis: Nonocclusive partially drained thrombus in the mid abdominal aorta-which occludes the ostium of the right renal artery.  Extensive wedge-shaped at areas of the right kidney-compatible with renal infarction.  Bilateral pneumonia. 9/24>> CT head: Mild increase in [facility]. 9/25>> MRI C-spine: Motion degraded study-tiny fracture of C6 vertebral body, marrow edema of the C5 spinous process. 9/27>> RUQ ultrasound: Unremarkable   Significant microbiology data: 9/23>> urine culture: No growth 9/23>> blood culture: No growth   Procedures: None   Consults: Neurology Nephrology Vascular surgery Neurosurgery Urology Palliative care  Brief Hospital Course: Acute CVA (hemorrhagic infarct) versus hemorrhagic contusion in the right frontal region Workup as above Remains encephalopathic due to severe AKI-difficult exam but suspect nonfocal.   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 Very encephalopathic-not possible to continue anticoagulation on discharge-very limited life expectancy-plans are for home hospice on discharge.   C6 vertebral body fracture Secondary to mechanical fall MRI-poor study-fracture not very visible. Evaluated by neurosurgery-hard collar removed on 9/22.   Oliguric AKI Multifactorial-ischemic, contrast nephropathy-as possible etiologies Evaluated by nephrology-not a HD candidate Unfortunately-renal function continues to worsen-he is oliguric-and severely encephalopathic.  Potassium controlled with Lokelma Extensive discussion with family by this MD-prior MDs and by the palliative care team-at this point it is felt that he is actively  dying-severely encephalopathic-after another family discussion yesterday-they have decided to take him home with hospice care.  PATIENT DETAILS Name: Eric [name] Age: 82 y.o. Sex: male Date of Birth: 1941/06/18 MRN: [id number]. Admitting Physician: Angie Fava, DO QMV:HQIONGEXB, Georgina Quint, MD  Admit Date: 05/22/2023 Discharge date: 06/14/2023  Recommendations for Outpatient Follow-up:  Optimize comfort measures  Admitted From:  Home  Disposition: Hospice care   Discharge Condition: poor  CODE STATUS:   Code Status: Limited: Do not attempt resuscitation (DNR) -DNR-LIMITED -Do Not Intubate/DNI    Diet recommendation:  Diet Order             Diet NPO time specified  Diet effective now                    Brief Summary: Patient is a 82 y.o.  male with history of CVA, HTN-who sustained a mechanical fall at home-upon further evaluation-was found to have acute CVA and C6 vertebral body fracture.  Patient was initially evaluated at [facility] and transferred to [facility] service at [facility] for further evaluation.  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  Evaluated by nephrology-not thought to be a dialysis candidate-palliative care following-made DNR-but family still wanting full scope of care.  See below for further details.   Significant events: 9/19>> admit to [facility]   Significant studies: 6/23>> A1c: 5.8 9/19>> CT head: 0.9 cm focus of intraparenchymal hemorrhage right frontal lobe.  Acute to subacute cortical infarct left parietal lobe. 9/19 >>CT C-spine: Acute nondisplaced fracture through the anterior inferior corner of C6 vertebral body. 9/20>> CT head: Stable 1 cm anterior right middle frontal gyrus hemorrhagic contusion versus trace subarachnoid blood. 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 9/20>> MRI brain: Acute infarct anterior right frontal lobe. 9/20>> echo: EF 55-60% 9/20>> LDL: 112 9/21>> MRI C-spine: Known fracture of C1 is not well-seen on the study. 9/23>> CT chest/abdomen/pelvis: Nonocclusive partially drained thrombus in the mid abdominal aorta-which occludes the ostium of the right renal artery.  Extensive wedge-shaped at areas of the right kidney-compatible with renal infarction.  Bilateral pneumonia. 9/24>> CT head: Mild increase in [facility]. 9/25>> MRI C-spine: Motion degraded study-tiny fracture of C6 vertebral body, marrow edema of the C5 spinous process. 9/27>> RUQ ultrasound: Unremarkable   Significant microbiology data: 9/23>> urine culture: No growth 9/23>> blood culture: No growth   Procedures: None   Consults: Neurology Nephrology Vascular surgery Neurosurgery Urology Palliative care  Brief Hospital Course: Acute CVA (hemorrhagic infarct) versus hemorrhagic contusion in the right frontal region Workup as above Remains encephalopathic due to severe AKI-difficult exam but suspect nonfocal.   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 Very encephalopathic-not possible to continue anticoagulation on discharge-very limited life expectancy-plans are for home hospice on discharge.   C6 vertebral body fracture Secondary to mechanical fall MRI-poor study-fracture not very visible. Evaluated by neurosurgery-hard collar removed on 9/22.   Oliguric AKI Multifactorial-ischemic, contrast nephropathy-as possible etiologies Evaluated by nephrology-not a HD candidate Unfortunately-renal function continues to worsen-he is oliguric-and severely encephalopathic.  Potassium controlled with Lokelma Extensive discussion with family by this MD-prior MDs and by the palliative care team-at this point it is felt that he is actively  dying-severely encephalopathic-after another family discussion yesterday-they have decided to take him home with hospice care.  the left ICA at the skull base but no stenosis. --Anterior cerebral arteries (ACA): Absent right A1 segments. Otherwise normal. --Middle cerebral arteries (MCA): No proximal occlusion or flow-limiting stenosis. Posterior circulation: --Vertebral arteries: Normal --Inferior cerebellar arteries: Normal. --Basilar artery: Normal. --Superior cerebellar arteries: Normal. --Posterior cerebral arteries: Normal. Venous sinuses: Poor visualization due to contrast timing Anatomic variants: Absent right A1 segment, normal variant. Review of the MIP images confirms the above findings IMPRESSION: 1. No emergent large vessel occlusion. 2. 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 3. Bilateral carotid bifurcation atherosclerosis without focal stenosis. 4. Advanced chronic white matter changes with multiple old infarcts, predominantly in the right hemisphere, but also the left parietal lobe and cerebellum. Emphysema (ICD10-J43.9). Electronically Signed   By: Deatra Robinson M.D.   On: 05/23/2023 21:34   MR BRAIN WO CONTRAST  Result Date: 05/23/2023 CLINICAL DATA:  Acute neurologic deficit EXAM: MRI HEAD WITHOUT CONTRAST TECHNIQUE: Multiplanar, multiecho pulse sequences of the brain and surrounding structures were obtained without intravenous contrast. COMPARISON:  None Available. FINDINGS: Truncated and motion degraded  examination. There is an area of abnormal diffusion restriction within the anterior right frontal lobe. 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 Normal flow voids at the skull base. Normal orbits. Clear paranasal sinuses and mastoid air cells. IMPRESSION: 1. Truncated and motion degraded examination. 2. Acute infarct within the anterior right frontal lobe. 3. Posterior right MCA territory encephalomalacia. 4. 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 Electronically Signed   By: Deatra Robinson M.D.   On: 05/23/2023 21:25   ECHOCARDIOGRAM COMPLETE  Result Date: 05/23/2023    ECHOCARDIOGRAM REPORT   Patient Name:   KINDRICK LANKF[facility] Date of Exam: 05/23/2023 Medical Rec #:  [id number]         Height:       68.0 in Accession #:    [id number]        Weight:       134.5 lb Date of Birth:  10-04-1940         BSA:          1.727 m Patient Age:    82 years          BP:           128/74 mmHg Patient Gender: M                 HR:           103 bpm. Exam Location:  Inpatient Procedure: 2D Echo, Cardiac Doppler and Color Doppler Indications:    Stroke  History:        Patient has prior history of Echocardiogram examinations, most                 recent 02/23/2023. Stroke and COPD; Risk Factors:Hypertension.  Sonographer:    Darlys Gales Referring Phys: [id number] Angie Fava  Sonographer Comments: Technically difficult study due to poor echo windows. Image acquisition challenging due to COPD and Image acquisition challenging due to uncooperative patient. IMPRESSIONS  1. Technically difficult study with very limited visualization of cardiac structures.  2. Left ventricular systolic function appears normal with an EF of 55% to 60%.  3. The right ventricle appears normal in structure and function.  4. The cardiac valves are not well visualized but there are no obvious abnormalities. FINDINGS  Left Ventricle: Left ventricular ejection fraction, by estimation, is 60 to 65%. The left ventricle has normal  function.  Allergies  Allergen Reactions   Hctz [Hydrochlorothiazide] Other (See Comments)    "Peeing too much"     Other Procedures/Studies: DG Abd Portable 1V  Result Date: 06/11/2023 CLINICAL DATA:  Feeding tube placement. EXAM: PORTABLE ABDOMEN - 1 VIEW COMPARISON:  None Available. FINDINGS: Tip of the weighted enteric tube is below the diaphragm in the stomach in the left upper quadrant, in the region of the gastric cardia. IMPRESSION: Tip of the weighted enteric tube below the diaphragm in the stomach in the left upper quadrant, in  the region of the gastric cardia. Assuming tube is to be used for feeding, recommend repositioning/advancement to place the tip in the more distal stomach. Electronically Signed   By: Narda Rutherford M.D.   On: 06/11/2023 16:28   DG Chest Port 1 View  Result Date: 06/04/2023 CLINICAL DATA:  Evaluate for aspiration pneumonia, shortness of breath EXAM: PORTABLE CHEST 1 VIEW COMPARISON:  Chest radiograph 1 day prior FINDINGS: The cardiomediastinal silhouette is stable Extensive airspace opacities in both lung bases and left midlung have worsened in the interim. The upper lungs are clear. There is no pulmonary edema. There is no pleural effusion or pneumothorax There is no acute osseous abnormality. IMPRESSION: Extensive airspace opacities in both lung bases and left midlung have worsened in the interim. Electronically Signed   By: Lesia Hausen M.D.   On: 06/04/2023 09:25   DG Swallowing Func-Speech Pathology  Result Date: 06/03/2023 Table formatting from the original result was not included. Modified Barium Swallow Study Patient Details Name: JOSHU FURUKAWA MRN: [id number] Date of Birth: Aug 23, 1941 Today's Date: 06/03/2023 HPI/PMH: HPI: 82 y.o. male presents to [facility] hospital on 05/23/2023 as a transfer from [facility] after a fall. CT head suggestive of acute to subacute L parietal infarct, as well as IPH in R frontal lobe. Pt also found to have C6 vertebral body fx. PMH includes CVA, HTN. Pt is mild-moderately dysarthric Clinical Impression: Clinical Impression: Very limited study, pt unable to be appropriately positioned for visiblity. Pt unwilling to accept more than two sips of barium. Regardless, able to determine that pt has severe pharyngeal dysphagia with no hyoid excursion or PES opening with no entrance of barium into the cervical esophagus. More than 50% of bolus was aspirated with sensation but no ejection. Pt is severely weak and unable to protect airway or compensate in any way. There is no  expectation that swallowing function will improve given inability to participate in activity and no nutrition. Counseled daughter about severity of dysphagia and poor prognosis for improvement with review of images. Assured daughter that pt is trying his best, but is unable to eat and drink. Hopeful that study will give family more information to guide decision making. Factors that may increase risk of adverse event in presence of aspiration Rubye Oaks & Clearance Coots 2021): No data recorded Recommendations/Plan: Swallowing Evaluation Recommendations Swallowing Evaluation Recommendations Recommendations: -- (comfort feeding) Treatment Plan Treatment Plan Treatment recommendations: No treatment recommended at this time Recommendations Recommendations for follow up therapy are one component of a multi-disciplinary discharge planning process, led by the attending physician.  Recommendations may be updated based on patient status, additional functional criteria and insurance authorization. Assessment: Orofacial Exam: Orofacial Exam Oral Cavity - Dentition: Edentulous Anatomy: No data recorded Boluses Administered: Boluses Administered Boluses Administered: Thin liquids (Level 0); Mildly thick liquids (Level 2, nectar thick)  Oral Impairment Domain: Oral Impairment Domain Lip Closure: Escape beyond mid-chin Tongue control during bolus hold: Not tested Bolus transport/lingual  the left ICA at the skull base but no stenosis. --Anterior cerebral arteries (ACA): Absent right A1 segments. Otherwise normal. --Middle cerebral arteries (MCA): No proximal occlusion or flow-limiting stenosis. Posterior circulation: --Vertebral arteries: Normal --Inferior cerebellar arteries: Normal. --Basilar artery: Normal. --Superior cerebellar arteries: Normal. --Posterior cerebral arteries: Normal. Venous sinuses: Poor visualization due to contrast timing Anatomic variants: Absent right A1 segment, normal variant. Review of the MIP images confirms the above findings IMPRESSION: 1. No emergent large vessel occlusion. 2. 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 3. Bilateral carotid bifurcation atherosclerosis without focal stenosis. 4. Advanced chronic white matter changes with multiple old infarcts, predominantly in the right hemisphere, but also the left parietal lobe and cerebellum. Emphysema (ICD10-J43.9). Electronically Signed   By: Deatra Robinson M.D.   On: 05/23/2023 21:34   MR BRAIN WO CONTRAST  Result Date: 05/23/2023 CLINICAL DATA:  Acute neurologic deficit EXAM: MRI HEAD WITHOUT CONTRAST TECHNIQUE: Multiplanar, multiecho pulse sequences of the brain and surrounding structures were obtained without intravenous contrast. COMPARISON:  None Available. FINDINGS: Truncated and motion degraded  examination. There is an area of abnormal diffusion restriction within the anterior right frontal lobe. 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 Normal flow voids at the skull base. Normal orbits. Clear paranasal sinuses and mastoid air cells. IMPRESSION: 1. Truncated and motion degraded examination. 2. Acute infarct within the anterior right frontal lobe. 3. Posterior right MCA territory encephalomalacia. 4. 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 Electronically Signed   By: Deatra Robinson M.D.   On: 05/23/2023 21:25   ECHOCARDIOGRAM COMPLETE  Result Date: 05/23/2023    ECHOCARDIOGRAM REPORT   Patient Name:   KINDRICK LANKFORD [facility] Date of Exam: 05/23/2023 Medical Rec #:  [id number]         Height:       68.0 in Accession #:    [id number]        Weight:       134.5 lb Date of Birth:  10-04-1940         BSA:          1.727 m Patient Age:    82 years          BP:           128/74 mmHg Patient Gender: M                 HR:           103 bpm. Exam Location:  Inpatient Procedure: 2D Echo, Cardiac Doppler and Color Doppler Indications:    Stroke  History:        Patient has prior history of Echocardiogram examinations, most                 recent 02/23/2023. Stroke and COPD; Risk Factors:Hypertension.  Sonographer:    Darlys Gales Referring Phys: [id number] Angie Fava  Sonographer Comments: Technically difficult study due to poor echo windows. Image acquisition challenging due to COPD and Image acquisition challenging due to uncooperative patient. IMPRESSIONS  1. Technically difficult study with very limited visualization of cardiac structures.  2. Left ventricular systolic function appears normal with an EF of 55% to 60%.  3. The right ventricle appears normal in structure and function.  4. The cardiac valves are not well visualized but there are no obvious abnormalities. FINDINGS  Left Ventricle: Left ventricular ejection fraction, by estimation, is 60 to 65%. The left ventricle has normal  function.  PATIENT DETAILS Name: Eric [name] Age: 82 y.o. Sex: male Date of Birth: 1941/06/18 MRN: [id number]. Admitting Physician: Angie Fava, DO QMV:HQIONGEXB, Georgina Quint, MD  Admit Date: 05/22/2023 Discharge date: 06/14/2023  Recommendations for Outpatient Follow-up:  Optimize comfort measures  Admitted From:  Home  Disposition: Hospice care   Discharge Condition: poor  CODE STATUS:   Code Status: Limited: Do not attempt resuscitation (DNR) -DNR-LIMITED -Do Not Intubate/DNI    Diet recommendation:  Diet Order             Diet NPO time specified  Diet effective now                    Brief Summary: Patient is a 82 y.o.  male with history of CVA, HTN-who sustained a mechanical fall at home-upon further evaluation-was found to have acute CVA and C6 vertebral body fracture.  Patient was initially evaluated at [facility] and transferred to [facility] service at [facility] for further evaluation.  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  Evaluated by nephrology-not thought to be a dialysis candidate-palliative care following-made DNR-but family still wanting full scope of care.  See below for further details.   Significant events: 9/19>> admit to [facility]   Significant studies: 6/23>> A1c: 5.8 9/19>> CT head: 0.9 cm focus of intraparenchymal hemorrhage right frontal lobe.  Acute to subacute cortical infarct left parietal lobe. 9/19 >>CT C-spine: Acute nondisplaced fracture through the anterior inferior corner of C6 vertebral body. 9/20>> CT head: Stable 1 cm anterior right middle frontal gyrus hemorrhagic contusion versus trace subarachnoid blood. 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 9/20>> MRI brain: Acute infarct anterior right frontal lobe. 9/20>> echo: EF 55-60% 9/20>> LDL: 112 9/21>> MRI C-spine: Known fracture of C1 is not well-seen on the study. 9/23>> CT chest/abdomen/pelvis: Nonocclusive partially drained thrombus in the mid abdominal aorta-which occludes the ostium of the right renal artery.  Extensive wedge-shaped at areas of the right kidney-compatible with renal infarction.  Bilateral pneumonia. 9/24>> CT head: Mild increase in [facility]. 9/25>> MRI C-spine: Motion degraded study-tiny fracture of C6 vertebral body, marrow edema of the C5 spinous process. 9/27>> RUQ ultrasound: Unremarkable   Significant microbiology data: 9/23>> urine culture: No growth 9/23>> blood culture: No growth   Procedures: None   Consults: Neurology Nephrology Vascular surgery Neurosurgery Urology Palliative care  Brief Hospital Course: Acute CVA (hemorrhagic infarct) versus hemorrhagic contusion in the right frontal region Workup as above Remains encephalopathic due to severe AKI-difficult exam but suspect nonfocal.   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 Very encephalopathic-not possible to continue anticoagulation on discharge-very limited life expectancy-plans are for home hospice on discharge.   C6 vertebral body fracture Secondary to mechanical fall MRI-poor study-fracture not very visible. Evaluated by neurosurgery-hard collar removed on 9/22.   Oliguric AKI Multifactorial-ischemic, contrast nephropathy-as possible etiologies Evaluated by nephrology-not a HD candidate Unfortunately-renal function continues to worsen-he is oliguric-and severely encephalopathic.  Potassium controlled with Lokelma Extensive discussion with family by this MD-prior MDs and by the palliative care team-at this point it is felt that he is actively  dying-severely encephalopathic-after another family discussion yesterday-they have decided to take him home with hospice care.  PATIENT DETAILS Name: Eric [name] Age: 82 y.o. Sex: male Date of Birth: 1941/06/18 MRN: [id number]. Admitting Physician: Angie Fava, DO QMV:[id number], Georgina Quint, MD  Admit Date: 05/22/2023 Discharge date: 06/14/2023  Recommendations for Outpatient Follow-up:  Optimize comfort measures  Admitted From:  Home  Disposition: Hospice care   Discharge Condition: poor  CODE STATUS:   Code Status: Limited: Do not attempt resuscitation (DNR) -DNR-LIMITED -Do Not Intubate/DNI    Diet recommendation:  Diet Order             Diet NPO time specified  Diet effective now                    Brief Summary: Patient is a 82 y.o.  male with history of CVA, HTN-who sustained a mechanical fall at home-upon further evaluation-was found to have acute CVA and C6 vertebral body fracture.  Patient was initially evaluated at [facility] and transferred to [facility] service at [facility] for further evaluation.  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  Evaluated by nephrology-not thought to be a dialysis candidate-palliative care following-made DNR-but family still wanting full scope of care.  See below for further details.   Significant events: 9/19>> admit to [facility]   Significant studies: 6/23>> A1c: 5.8 9/19>> CT head: 0.9 cm focus of intraparenchymal hemorrhage right frontal lobe.  Acute to subacute cortical infarct left parietal lobe. 9/19 >>CT C-spine: Acute nondisplaced fracture through the anterior inferior corner of C6 vertebral body. 9/20>> CT head: Stable 1 cm anterior right middle frontal gyrus hemorrhagic contusion versus trace subarachnoid blood. 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 9/20>> MRI brain: Acute infarct anterior right frontal lobe. 9/20>> echo: EF 55-60% 9/20>> LDL: 112 9/21>> MRI C-spine: Known fracture of C1 is not well-seen on the study. 9/23>> CT chest/abdomen/pelvis: Nonocclusive partially drained thrombus in the mid abdominal aorta-which occludes the ostium of the right renal artery.  Extensive wedge-shaped at areas of the right kidney-compatible with renal infarction.  Bilateral pneumonia. 9/24>> CT head: Mild increase in [facility]. 9/25>> MRI C-spine: Motion degraded study-tiny fracture of C6 vertebral body, marrow edema of the C5 spinous process. 9/27>> RUQ ultrasound: Unremarkable   Significant microbiology data: 9/23>> urine culture: No growth 9/23>> blood culture: No growth   Procedures: None   Consults: Neurology Nephrology Vascular surgery Neurosurgery Urology Palliative care  Brief Hospital Course: Acute CVA (hemorrhagic infarct) versus hemorrhagic contusion in the right frontal region Workup as above Remains encephalopathic due to severe AKI-difficult exam but suspect nonfocal.   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 Very encephalopathic-not possible to continue anticoagulation on discharge-very limited life expectancy-plans are for home hospice on discharge.   C6 vertebral body fracture Secondary to mechanical fall MRI-poor study-fracture not very visible. Evaluated by neurosurgery-hard collar removed on 9/22.   Oliguric AKI Multifactorial-ischemic, contrast nephropathy-as possible etiologies Evaluated by nephrology-not a HD candidate Unfortunately-renal function continues to worsen-he is oliguric-and severely encephalopathic.  Potassium controlled with Lokelma Extensive discussion with family by this MD-prior MDs and by the palliative care team-at this point it is felt that he is actively  dying-severely encephalopathic-after another family discussion yesterday-they have decided to take him home with hospice care.  PATIENT DETAILS Name: Eric [name] Age: 82 y.o. Sex: male Date of Birth: 1941/06/18 MRN: [id number]. Admitting Physician: Angie Fava, DO QMV:HQIONGEXB, Georgina Quint, MD  Admit Date: 05/22/2023 Discharge date: 06/14/2023  Recommendations for Outpatient Follow-up:  Optimize comfort measures  Admitted From:  Home  Disposition: Hospice care   Discharge Condition: poor  CODE STATUS:   Code Status: Limited: Do not attempt resuscitation (DNR) -DNR-LIMITED -Do Not Intubate/DNI    Diet recommendation:  Diet Order             Diet NPO time specified  Diet effective now                    Brief Summary: Patient is a 82 y.o.  male with history of CVA, HTN-who sustained a mechanical fall at home-upon further evaluation-was found to have acute CVA and C6 vertebral body fracture.  Patient was initially evaluated at [facility] and transferred to [facility] service at [facility] for further evaluation.  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  Evaluated by nephrology-not thought to be a dialysis candidate-palliative care following-made DNR-but family still wanting full scope of care.  See below for further details.   Significant events: 9/19>> admit to [facility]   Significant studies: 6/23>> A1c: 5.8 9/19>> CT head: 0.9 cm focus of intraparenchymal hemorrhage right frontal lobe.  Acute to subacute cortical infarct left parietal lobe. 9/19 >>CT C-spine: Acute nondisplaced fracture through the anterior inferior corner of C6 vertebral body. 9/20>> CT head: Stable 1 cm anterior right middle frontal gyrus hemorrhagic contusion versus trace subarachnoid blood. 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 9/20>> MRI brain: Acute infarct anterior right frontal lobe. 9/20>> echo: EF 55-60% 9/20>> LDL: 112 9/21>> MRI C-spine: Known fracture of C1 is not well-seen on the study. 9/23>> CT chest/abdomen/pelvis: Nonocclusive partially drained thrombus in the mid abdominal aorta-which occludes the ostium of the right renal artery.  Extensive wedge-shaped at areas of the right kidney-compatible with renal infarction.  Bilateral pneumonia. 9/24>> CT head: Mild increase in [facility]. 9/25>> MRI C-spine: Motion degraded study-tiny fracture of C6 vertebral body, marrow edema of the C5 spinous process. 9/27>> RUQ ultrasound: Unremarkable   Significant microbiology data: 9/23>> urine culture: No growth 9/23>> blood culture: No growth   Procedures: None   Consults: Neurology Nephrology Vascular surgery Neurosurgery Urology Palliative care  Brief Hospital Course: Acute CVA (hemorrhagic infarct) versus hemorrhagic contusion in the right frontal region Workup as above Remains encephalopathic due to severe AKI-difficult exam but suspect nonfocal.   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 Very encephalopathic-not possible to continue anticoagulation on discharge-very limited life expectancy-plans are for home hospice on discharge.   C6 vertebral body fracture Secondary to mechanical fall MRI-poor study-fracture not very visible. Evaluated by neurosurgery-hard collar removed on 9/22.   Oliguric AKI Multifactorial-ischemic, contrast nephropathy-as possible etiologies Evaluated by nephrology-not a HD candidate Unfortunately-renal function continues to worsen-he is oliguric-and severely encephalopathic.  Potassium controlled with Lokelma Extensive discussion with family by this MD-prior MDs and by the palliative care team-at this point it is felt that he is actively  dying-severely encephalopathic-after another family discussion yesterday-they have decided to take him home with hospice care.  PATIENT DETAILS Name: Eric [name] Age: 82 y.o. Sex: male Date of Birth: 1941/06/18 MRN: [id number]. Admitting Physician: Angie Fava, DO QMV:HQIONGEXB, Georgina Quint, MD  Admit Date: 05/22/2023 Discharge date: 06/14/2023  Recommendations for Outpatient Follow-up:  Optimize comfort measures  Admitted From:  Home  Disposition: Hospice care   Discharge Condition: poor  CODE STATUS:   Code Status: Limited: Do not attempt resuscitation (DNR) -DNR-LIMITED -Do Not Intubate/DNI    Diet recommendation:  Diet Order             Diet NPO time specified  Diet effective now                    Brief Summary: Patient is a 82 y.o.  male with history of CVA, HTN-who sustained a mechanical fall at home-upon further evaluation-was found to have acute CVA and C6 vertebral body fracture.  Patient was initially evaluated at [facility] and transferred to [facility] service at [facility] for further evaluation.  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  Evaluated by nephrology-not thought to be a dialysis candidate-palliative care following-made DNR-but family still wanting full scope of care.  See below for further details.   Significant events: 9/19>> admit to [facility]   Significant studies: 6/23>> A1c: 5.8 9/19>> CT head: 0.9 cm focus of intraparenchymal hemorrhage right frontal lobe.  Acute to subacute cortical infarct left parietal lobe. 9/19 >>CT C-spine: Acute nondisplaced fracture through the anterior inferior corner of C6 vertebral body. 9/20>> CT head: Stable 1 cm anterior right middle frontal gyrus hemorrhagic contusion versus trace subarachnoid blood. 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 9/20>> MRI brain: Acute infarct anterior right frontal lobe. 9/20>> echo: EF 55-60% 9/20>> LDL: 112 9/21>> MRI C-spine: Known fracture of C1 is not well-seen on the study. 9/23>> CT chest/abdomen/pelvis: Nonocclusive partially drained thrombus in the mid abdominal aorta-which occludes the ostium of the right renal artery.  Extensive wedge-shaped at areas of the right kidney-compatible with renal infarction.  Bilateral pneumonia. 9/24>> CT head: Mild increase in [facility]. 9/25>> MRI C-spine: Motion degraded study-tiny fracture of C6 vertebral body, marrow edema of the C5 spinous process. 9/27>> RUQ ultrasound: Unremarkable   Significant microbiology data: 9/23>> urine culture: No growth 9/23>> blood culture: No growth   Procedures: None   Consults: Neurology Nephrology Vascular surgery Neurosurgery Urology Palliative care  Brief Hospital Course: Acute CVA (hemorrhagic infarct) versus hemorrhagic contusion in the right frontal region Workup as above Remains encephalopathic due to severe AKI-difficult exam but suspect nonfocal.   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 Very encephalopathic-not possible to continue anticoagulation on discharge-very limited life expectancy-plans are for home hospice on discharge.   C6 vertebral body fracture Secondary to mechanical fall MRI-poor study-fracture not very visible. Evaluated by neurosurgery-hard collar removed on 9/22.   Oliguric AKI Multifactorial-ischemic, contrast nephropathy-as possible etiologies Evaluated by nephrology-not a HD candidate Unfortunately-renal function continues to worsen-he is oliguric-and severely encephalopathic.  Potassium controlled with Lokelma Extensive discussion with family by this MD-prior MDs and by the palliative care team-at this point it is felt that he is actively  dying-severely encephalopathic-after another family discussion yesterday-they have decided to take him home with hospice care.  the left ICA at the skull base but no stenosis. --Anterior cerebral arteries (ACA): Absent right A1 segments. Otherwise normal. --Middle cerebral arteries (MCA): No proximal occlusion or flow-limiting stenosis. Posterior circulation: --Vertebral arteries: Normal --Inferior cerebellar arteries: Normal. --Basilar artery: Normal. --Superior cerebellar arteries: Normal. --Posterior cerebral arteries: Normal. Venous sinuses: Poor visualization due to contrast timing Anatomic variants: Absent right A1 segment, normal variant. Review of the MIP images confirms the above findings IMPRESSION: 1. No emergent large vessel occlusion. 2. 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 3. Bilateral carotid bifurcation atherosclerosis without focal stenosis. 4. Advanced chronic white matter changes with multiple old infarcts, predominantly in the right hemisphere, but also the left parietal lobe and cerebellum. Emphysema (ICD10-J43.9). Electronically Signed   By: Deatra Robinson M.D.   On: 05/23/2023 21:34   MR BRAIN WO CONTRAST  Result Date: 05/23/2023 CLINICAL DATA:  Acute neurologic deficit EXAM: MRI HEAD WITHOUT CONTRAST TECHNIQUE: Multiplanar, multiecho pulse sequences of the brain and surrounding structures were obtained without intravenous contrast. COMPARISON:  None Available. FINDINGS: Truncated and motion degraded  examination. There is an area of abnormal diffusion restriction within the anterior right frontal lobe. 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 Normal flow voids at the skull base. Normal orbits. Clear paranasal sinuses and mastoid air cells. IMPRESSION: 1. Truncated and motion degraded examination. 2. Acute infarct within the anterior right frontal lobe. 3. Posterior right MCA territory encephalomalacia. 4. 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 Electronically Signed   By: Deatra Robinson M.D.   On: 05/23/2023 21:25   ECHOCARDIOGRAM COMPLETE  Result Date: 05/23/2023    ECHOCARDIOGRAM REPORT   Patient Name:   KINDRICK [facility] Date of Exam: 05/23/2023 Medical Rec #:  [id number]         Height:       68.0 in Accession #:    [id number]        Weight:       134.5 lb Date of Birth:  10-04-1940         BSA:          1.727 m Patient Age:    82 years          BP:           128/74 mmHg Patient Gender: M                 HR:           103 bpm. Exam Location:  Inpatient Procedure: 2D Echo, Cardiac Doppler and Color Doppler Indications:    Stroke  History:        Patient has prior history of Echocardiogram examinations, most                 recent 02/23/2023. Stroke and COPD; Risk Factors:Hypertension.  Sonographer:    Darlys Gales Referring Phys: [id number] Angie Fava  Sonographer Comments: Technically difficult study due to poor echo windows. Image acquisition challenging due to COPD and Image acquisition challenging due to uncooperative patient. IMPRESSIONS  1. Technically difficult study with very limited visualization of cardiac structures.  2. Left ventricular systolic function appears normal with an EF of 55% to 60%.  3. The right ventricle appears normal in structure and function.  4. The cardiac valves are not well visualized but there are no obvious abnormalities. FINDINGS  Left Ventricle: Left ventricular ejection fraction, by estimation, is 60 to 65%. The left ventricle has normal  function.  the left ICA at the skull base but no stenosis. --Anterior cerebral arteries (ACA): Absent right A1 segments. Otherwise normal. --Middle cerebral arteries (MCA): No proximal occlusion or flow-limiting stenosis. Posterior circulation: --Vertebral arteries: Normal --Inferior cerebellar arteries: Normal. --Basilar artery: Normal. --Superior cerebellar arteries: Normal. --Posterior cerebral arteries: Normal. Venous sinuses: Poor visualization due to contrast timing Anatomic variants: Absent right A1 segment, normal variant. Review of the MIP images confirms the above findings IMPRESSION: 1. No emergent large vessel occlusion. 2. 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 3. Bilateral carotid bifurcation atherosclerosis without focal stenosis. 4. Advanced chronic white matter changes with multiple old infarcts, predominantly in the right hemisphere, but also the left parietal lobe and cerebellum. Emphysema (ICD10-J43.9). Electronically Signed   By: Deatra Robinson M.D.   On: 05/23/2023 21:34   MR BRAIN WO CONTRAST  Result Date: 05/23/2023 CLINICAL DATA:  Acute neurologic deficit EXAM: MRI HEAD WITHOUT CONTRAST TECHNIQUE: Multiplanar, multiecho pulse sequences of the brain and surrounding structures were obtained without intravenous contrast. COMPARISON:  None Available. FINDINGS: Truncated and motion degraded  examination. There is an area of abnormal diffusion restriction within the anterior right frontal lobe. 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 Normal flow voids at the skull base. Normal orbits. Clear paranasal sinuses and mastoid air cells. IMPRESSION: 1. Truncated and motion degraded examination. 2. Acute infarct within the anterior right frontal lobe. 3. Posterior right MCA territory encephalomalacia. 4. 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 Electronically Signed   By: Deatra Robinson M.D.   On: 05/23/2023 21:25   ECHOCARDIOGRAM COMPLETE  Result Date: 05/23/2023    ECHOCARDIOGRAM REPORT   Patient Name:   KINDRICK LANKF[facility] Date of Exam: 05/23/2023 Medical Rec #:  [id number]         Height:       68.0 in Accession #:    [id number]        Weight:       134.5 lb Date of Birth:  10-04-1940         BSA:          1.727 m Patient Age:    82 years          BP:           128/74 mmHg Patient Gender: M                 HR:           103 bpm. Exam Location:  Inpatient Procedure: 2D Echo, Cardiac Doppler and Color Doppler Indications:    Stroke  History:        Patient has prior history of Echocardiogram examinations, most                 recent 02/23/2023. Stroke and COPD; Risk Factors:Hypertension.  Sonographer:    Darlys Gales Referring Phys: [id number] Angie Fava  Sonographer Comments: Technically difficult study due to poor echo windows. Image acquisition challenging due to COPD and Image acquisition challenging due to uncooperative patient. IMPRESSIONS  1. Technically difficult study with very limited visualization of cardiac structures.  2. Left ventricular systolic function appears normal with an EF of 55% to 60%.  3. The right ventricle appears normal in structure and function.  4. The cardiac valves are not well visualized but there are no obvious abnormalities. FINDINGS  Left Ventricle: Left ventricular ejection fraction, by estimation, is 60 to 65%. The left ventricle has normal  function.  the left ICA at the skull base but no stenosis. --Anterior cerebral arteries (ACA): Absent right A1 segments. Otherwise normal. --Middle cerebral arteries (MCA): No proximal occlusion or flow-limiting stenosis. Posterior circulation: --Vertebral arteries: Normal --Inferior cerebellar arteries: Normal. --Basilar artery: Normal. --Superior cerebellar arteries: Normal. --Posterior cerebral arteries: Normal. Venous sinuses: Poor visualization due to contrast timing Anatomic variants: Absent right A1 segment, normal variant. Review of the MIP images confirms the above findings IMPRESSION: 1. No emergent large vessel occlusion. 2. 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 3. Bilateral carotid bifurcation atherosclerosis without focal stenosis. 4. Advanced chronic white matter changes with multiple old infarcts, predominantly in the right hemisphere, but also the left parietal lobe and cerebellum. Emphysema (ICD10-J43.9). Electronically Signed   By: Deatra Robinson M.D.   On: 05/23/2023 21:34   MR BRAIN WO CONTRAST  Result Date: 05/23/2023 CLINICAL DATA:  Acute neurologic deficit EXAM: MRI HEAD WITHOUT CONTRAST TECHNIQUE: Multiplanar, multiecho pulse sequences of the brain and surrounding structures were obtained without intravenous contrast. COMPARISON:  None Available. FINDINGS: Truncated and motion degraded  examination. There is an area of abnormal diffusion restriction within the anterior right frontal lobe. 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 Normal flow voids at the skull base. Normal orbits. Clear paranasal sinuses and mastoid air cells. IMPRESSION: 1. Truncated and motion degraded examination. 2. Acute infarct within the anterior right frontal lobe. 3. Posterior right MCA territory encephalomalacia. 4. 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 Electronically Signed   By: Deatra Robinson M.D.   On: 05/23/2023 21:25   ECHOCARDIOGRAM COMPLETE  Result Date: 05/23/2023    ECHOCARDIOGRAM REPORT   Patient Name:   KINDRICK LANKF[facility] Date of Exam: 05/23/2023 Medical Rec #:  [id number]         Height:       68.0 in Accession #:    [id number]        Weight:       134.5 lb Date of Birth:  10-04-1940         BSA:          1.727 m Patient Age:    82 years          BP:           128/74 mmHg Patient Gender: M                 HR:           103 bpm. Exam Location:  Inpatient Procedure: 2D Echo, Cardiac Doppler and Color Doppler Indications:    Stroke  History:        Patient has prior history of Echocardiogram examinations, most                 recent 02/23/2023. Stroke and COPD; Risk Factors:Hypertension.  Sonographer:    Darlys Gales Referring Phys: [id number] Angie Fava  Sonographer Comments: Technically difficult study due to poor echo windows. Image acquisition challenging due to COPD and Image acquisition challenging due to uncooperative patient. IMPRESSIONS  1. Technically difficult study with very limited visualization of cardiac structures.  2. Left ventricular systolic function appears normal with an EF of 55% to 60%.  3. The right ventricle appears normal in structure and function.  4. The cardiac valves are not well visualized but there are no obvious abnormalities. FINDINGS  Left Ventricle: Left ventricular ejection fraction, by estimation, is 60 to 65%. The left ventricle has normal  function.  PATIENT DETAILS Name: Eric [name] Age: 82 y.o. Sex: male Date of Birth: 1941/06/18 MRN: [id number]. Admitting Physician: Angie Fava, DO QMV:[id number], Georgina Quint, MD  Admit Date: 05/22/2023 Discharge date: 06/14/2023  Recommendations for Outpatient Follow-up:  Optimize comfort measures  Admitted From:  Home  Disposition: Hospice care   Discharge Condition: poor  CODE STATUS:   Code Status: Limited: Do not attempt resuscitation (DNR) -DNR-LIMITED -Do Not Intubate/DNI    Diet recommendation:  Diet Order             Diet NPO time specified  Diet effective now                    Brief Summary: Patient is a 82 y.o.  male with history of CVA, HTN-who sustained a mechanical fall at home-upon further evaluation-was found to have acute CVA and C6 vertebral body fracture.  Patient was initially evaluated at [facility] and transferred to [facility] service at [facility] for further evaluation.  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  Evaluated by nephrology-not thought to be a dialysis candidate-palliative care following-made DNR-but family still wanting full scope of care.  See below for further details.   Significant events: 9/19>> admit to [facility]   Significant studies: 6/23>> A1c: 5.8 9/19>> CT head: 0.9 cm focus of intraparenchymal hemorrhage right frontal lobe.  Acute to subacute cortical infarct left parietal lobe. 9/19 >>CT C-spine: Acute nondisplaced fracture through the anterior inferior corner of C6 vertebral body. 9/20>> CT head: Stable 1 cm anterior right middle frontal gyrus hemorrhagic contusion versus trace subarachnoid blood. 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 9/20>> MRI brain: Acute infarct anterior right frontal lobe. 9/20>> echo: EF 55-60% 9/20>> LDL: 112 9/21>> MRI C-spine: Known fracture of C1 is not well-seen on the study. 9/23>> CT chest/abdomen/pelvis: Nonocclusive partially drained thrombus in the mid abdominal aorta-which occludes the ostium of the right renal artery.  Extensive wedge-shaped at areas of the right kidney-compatible with renal infarction.  Bilateral pneumonia. 9/24>> CT head: Mild increase in [facility]. 9/25>> MRI C-spine: Motion degraded study-tiny fracture of C6 vertebral body, marrow edema of the C5 spinous process. 9/27>> RUQ ultrasound: Unremarkable   Significant microbiology data: 9/23>> urine culture: No growth 9/23>> blood culture: No growth   Procedures: None   Consults: Neurology Nephrology Vascular surgery Neurosurgery Urology Palliative care  Brief Hospital Course: Acute CVA (hemorrhagic infarct) versus hemorrhagic contusion in the right frontal region Workup as above Remains encephalopathic due to severe AKI-difficult exam but suspect nonfocal.   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 Very encephalopathic-not possible to continue anticoagulation on discharge-very limited life expectancy-plans are for home hospice on discharge.   C6 vertebral body fracture Secondary to mechanical fall MRI-poor study-fracture not very visible. Evaluated by neurosurgery-hard collar removed on 9/22.   Oliguric AKI Multifactorial-ischemic, contrast nephropathy-as possible etiologies Evaluated by nephrology-not a HD candidate Unfortunately-renal function continues to worsen-he is oliguric-and severely encephalopathic.  Potassium controlled with Lokelma Extensive discussion with family by this MD-prior MDs and by the palliative care team-at this point it is felt that he is actively  dying-severely encephalopathic-after another family discussion yesterday-they have decided to take him home with hospice care.  the left ICA at the skull base but no stenosis. --Anterior cerebral arteries (ACA): Absent right A1 segments. Otherwise normal. --Middle cerebral arteries (MCA): No proximal occlusion or flow-limiting stenosis. Posterior circulation: --Vertebral arteries: Normal --Inferior cerebellar arteries: Normal. --Basilar artery: Normal. --Superior cerebellar arteries: Normal. --Posterior cerebral arteries: Normal. Venous sinuses: Poor visualization due to contrast timing Anatomic variants: Absent right A1 segment, normal variant. Review of the MIP images confirms the above findings IMPRESSION: 1. No emergent large vessel occlusion. 2. 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 3. Bilateral carotid bifurcation atherosclerosis without focal stenosis. 4. Advanced chronic white matter changes with multiple old infarcts, predominantly in the right hemisphere, but also the left parietal lobe and cerebellum. Emphysema (ICD10-J43.9). Electronically Signed   By: Deatra Robinson M.D.   On: 05/23/2023 21:34   MR BRAIN WO CONTRAST  Result Date: 05/23/2023 CLINICAL DATA:  Acute neurologic deficit EXAM: MRI HEAD WITHOUT CONTRAST TECHNIQUE: Multiplanar, multiecho pulse sequences of the brain and surrounding structures were obtained without intravenous contrast. COMPARISON:  None Available. FINDINGS: Truncated and motion degraded  examination. There is an area of abnormal diffusion restriction within the anterior right frontal lobe. 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 Normal flow voids at the skull base. Normal orbits. Clear paranasal sinuses and mastoid air cells. IMPRESSION: 1. Truncated and motion degraded examination. 2. Acute infarct within the anterior right frontal lobe. 3. Posterior right MCA territory encephalomalacia. 4. 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 Electronically Signed   By: Deatra Robinson M.D.   On: 05/23/2023 21:25   ECHOCARDIOGRAM COMPLETE  Result Date: 05/23/2023    ECHOCARDIOGRAM REPORT   Patient Name:   KINDRICK [facility]ANK[facility] Date of Exam: 05/23/2023 Medical Rec #:  [id number]         Height:       68.0 in Accession #:    [id number]        Weight:       134.5 lb Date of Birth:  10-04-1940         BSA:          1.727 m Patient Age:    82 years          BP:           128/74 mmHg Patient Gender: M                 HR:           103 bpm. Exam Location:  Inpatient Procedure: 2D Echo, Cardiac Doppler and Color Doppler Indications:    Stroke  History:        Patient has prior history of Echocardiogram examinations, most                 recent 02/23/2023. Stroke and COPD; Risk Factors:Hypertension.  Sonographer:    Darlys Gales Referring Phys: [id number] Angie Fava  Sonographer Comments: Technically difficult study due to poor echo windows. Image acquisition challenging due to COPD and Image acquisition challenging due to uncooperative patient. IMPRESSIONS  1. Technically difficult study with very limited visualization of cardiac structures.  2. Left ventricular systolic function appears normal with an EF of 55% to 60%.  3. The right ventricle appears normal in structure and function.  4. The cardiac valves are not well visualized but there are no obvious abnormalities. FINDINGS  Left Ventricle: Left ventricular ejection fraction, by estimation, is 60 to 65%. The left ventricle has normal  function.

## 2023-06-14 NOTE — Progress Notes (Signed)
PTAR present to pick up pt to take him home. Two of pt's daughters, including [name], are present. Pt left unit in NAD.

## 2023-06-14 NOTE — Plan of Care (Signed)
Pt remains on RA. Tolerating tube feeds. Voiding and stooling. Family at bedside.    Problem: Education: Goal: Knowledge of disease or condition will improve Outcome: Progressing Goal: Knowledge of secondary prevention will improve (MUST DOCUMENT ALL) Outcome: Progressing Goal: Knowledge of patient specific risk factors will improve Loraine Leriche N/A or DELETE if not current risk factor) Outcome: Progressing   Problem: Ischemic Stroke/TIA Tissue Perfusion: Goal: Complications of ischemic stroke/TIA will be minimized Outcome: Progressing   Problem: Coping: Goal: Will verbalize positive feelings about self Outcome: Progressing Goal: Will identify appropriate support needs Outcome: Progressing   Problem: Health Behavior/Discharge Planning: Goal: Ability to manage health-related needs will improve Outcome: Progressing Goal: Goals will be collaboratively established with patient/family Outcome: Progressing   Problem: Self-Care: Goal: Ability to participate in self-care as condition permits will improve Outcome: Progressing Goal: Verbalization of feelings and concerns over difficulty with self-care will improve Outcome: Progressing Goal: Ability to communicate needs accurately will improve Outcome: Progressing   Problem: Nutrition: Goal: Risk of aspiration will decrease Outcome: Progressing Goal: Dietary intake will improve Outcome: Progressing   Problem: Education: Goal: Knowledge of General Education information will improve Description: Including pain rating scale, medication(s)/side effects and non-pharmacologic comfort measures Outcome: Progressing   Problem: Health Behavior/Discharge Planning: Goal: Ability to manage health-related needs will improve Outcome: Progressing   Problem: Clinical Measurements: Goal: Ability to maintain clinical measurements within normal limits will improve Outcome: Progressing Goal: Will remain free from infection Outcome: Progressing Goal:  Diagnostic test results will improve Outcome: Progressing Goal: Respiratory complications will improve Outcome: Progressing Goal: Cardiovascular complication will be avoided Outcome: Progressing   Problem: Activity: Goal: Risk for activity intolerance will decrease Outcome: Progressing   Problem: Nutrition: Goal: Adequate nutrition will be maintained Outcome: Progressing   Problem: Coping: Goal: Level of anxiety will decrease Outcome: Progressing   Problem: Elimination: Goal: Will not experience complications related to bowel motility Outcome: Progressing Goal: Will not experience complications related to urinary retention Outcome: Progressing   Problem: Pain Managment: Goal: General experience of comfort will improve Outcome: Progressing   Problem: Safety: Goal: Ability to remain free from injury will improve Outcome: Progressing   Problem: Skin Integrity: Goal: Risk for impaired skin integrity will decrease Outcome: Progressing

## 2023-06-14 NOTE — TOC Transition Note (Signed)
Transition of Care [facility]) - CM/SW Discharge Note   Patient Details  Name: Eric [name] MRN: [id number] Date of Birth: 23-Mar-1941  Transition of Care [facility]) CM/SW Contact:  Lawerance Sabal, RN Phone Number: 06/14/2023, 8:46 AM   Clinical Narrative:     Discussed Dc with attending. Plan for H Hospice today. Referral made yesterday to [facility]. Spoke w Thea Gist RN [facility], states bed has been ordered this morning. Patient will need bed in place at home prior to DC and transport via PTAR.  Spoke w daughter Eric [name]. She is agreeable w DC today, pending bed delivery. She requested [facility] set up delivery through her, this request has been relayed to [facility]. Verified address on file w Eric [name].   Patient to DC via PTAR once bed delivered at home, hopefully today.  PTAR forms and DNR on chart.      [name],Eric (Daughter) [phone]    Final next level of care: Home w Hospice Care Barriers to Discharge: Equipment Delay   Patient Goals and CMS Choice CMS Medicare.gov Compare Post Acute Care list provided to:: Patient Represenative (must comment) Choice offered to / list presented to : Adult Children  Discharge Placement                         Discharge Plan and Services Additional resources added to the After Visit Summary for   In-house Referral: Clinical Social Work                          [facility] Agency: [facility] Date [facility] Agency Contacted: 06/14/23 Time HH Agency Contacted: [phone] Representative spoke with at [facility] Agency: Thea Gist  Social Determinants of Health (SDOH) Interventions SDOH Screenings   Food Insecurity: No Food Insecurity (05/30/2023)  Housing: Low Risk  (05/30/2023)  Transportation Needs: No Transportation Needs (05/30/2023)  Utilities: Not At Risk (05/30/2023)  Depression (PHQ2-9): Low Risk  (04/17/2023)  Tobacco Use: Medium Risk (05/22/2023)     Readmission Risk Interventions     No data to display

## 2023-06-14 NOTE — Plan of Care (Signed)
  Problem: Education: Goal: Knowledge of disease or condition will improve Outcome: Adequate for Discharge Goal: Knowledge of secondary prevention will improve (MUST DOCUMENT ALL) Outcome: Adequate for Discharge Goal: Knowledge of patient specific risk factors will improve Loraine Leriche N/A or DELETE if not current risk factor) Outcome: Adequate for Discharge   Problem: Ischemic Stroke/TIA Tissue Perfusion: Goal: Complications of ischemic stroke/TIA will be minimized Outcome: Adequate for Discharge   Problem: Coping: Goal: Will verbalize positive feelings about self Outcome: Adequate for Discharge Goal: Will identify appropriate support needs Outcome: Adequate for Discharge   Problem: Health Behavior/Discharge Planning: Goal: Ability to manage health-related needs will improve Outcome: Adequate for Discharge Goal: Goals will be collaboratively established with patient/family Outcome: Adequate for Discharge   Problem: Self-Care: Goal: Ability to participate in self-care as condition permits will improve Outcome: Adequate for Discharge Goal: Verbalization of feelings and concerns over difficulty with self-care will improve Outcome: Adequate for Discharge Goal: Ability to communicate needs accurately will improve Outcome: Adequate for Discharge   Problem: Nutrition: Goal: Risk of aspiration will decrease Outcome: Adequate for Discharge Goal: Dietary intake will improve Outcome: Adequate for Discharge   Problem: Education: Goal: Knowledge of General Education information will improve Description: Including pain rating scale, medication(s)/side effects and non-pharmacologic comfort measures Outcome: Adequate for Discharge   Problem: Health Behavior/Discharge Planning: Goal: Ability to manage health-related needs will improve Outcome: Adequate for Discharge   Problem: Clinical Measurements: Goal: Ability to maintain clinical measurements within normal limits will improve Outcome:  Adequate for Discharge Goal: Will remain free from infection Outcome: Adequate for Discharge Goal: Diagnostic test results will improve Outcome: Adequate for Discharge Goal: Respiratory complications will improve Outcome: Adequate for Discharge Goal: Cardiovascular complication will be avoided Outcome: Adequate for Discharge   Problem: Activity: Goal: Risk for activity intolerance will decrease Outcome: Adequate for Discharge   Problem: Nutrition: Goal: Adequate nutrition will be maintained Outcome: Adequate for Discharge   Problem: Coping: Goal: Level of anxiety will decrease Outcome: Adequate for Discharge   Problem: Elimination: Goal: Will not experience complications related to bowel motility Outcome: Adequate for Discharge Goal: Will not experience complications related to urinary retention Outcome: Adequate for Discharge   Problem: Pain Managment: Goal: General experience of comfort will improve Outcome: Adequate for Discharge   Problem: Safety: Goal: Ability to remain free from injury will improve Outcome: Adequate for Discharge   Problem: Skin Integrity: Goal: Risk for impaired skin integrity will decrease Outcome: Adequate for Discharge

## 2023-06-17 ENCOUNTER — Telehealth: Payer: Self-pay | Admitting: *Deleted

## 2023-06-17 NOTE — Transitions of Care (Post Inpatient/ED Visit) (Signed)
06/17/2023  Name: Eric [name] MRN: [id number] DOB: June 06, 1941  Today's TOC FU Call Status: Today's TOC FU Call Status:: Successful TOC FU Call Completed TOC FU Call Complete Date: 06/17/23 Patient's Name and Date of Birth confirmed.  Transition Care Management Follow-up Telephone Call Date of Discharge: 06/14/23 Discharge Facility: [facility]) Type of Discharge: Inpatient Admission Primary Inpatient Discharge Diagnosis:: Home with hospice services- confirmed with [facility] collective [phone]  Care Coordination for [facility] review/ follow up completed: spoke with "Doniquia," who confirms Hospice services are currently established and active for patient  Items Reviewed:    Medications Reviewed Today: Medications Reviewed Today     Reviewed by Michaela Corner, RN (Registered Nurse) on 06/17/23 at 1147  Med List Status: <None>   Medication Order Taking? Sig Documenting Provider Last Dose Status Informant  LORazepam (ATIVAN) 1 MG tablet [id number]  Place 1 tablet (1 mg total) under the tongue every 6 (six) hours as needed for anxiety, seizure or sedation. Maretta Bees, MD  Active   oxyCODONE (ROXICODONE) 5 MG/5ML solution [id number]  Take 5-10 mLs (5-10 mg total) by mouth every 6 (six) hours as needed for severe pain. Maretta Bees, MD  Active             Home Care and Equipment/Supplies:    Functional Questionnaire:    Follow up appointments reviewed:    Caryl Pina, RN, BSN, CCRN Alumnus RN Care Manager  Transitions of Care  [facility] [phone]: direct office

## 2023-06-25 ENCOUNTER — Ambulatory Visit: Payer: Medicare Other | Admitting: Internal Medicine

## 2023-06-26 ENCOUNTER — Ambulatory Visit: Payer: Self-pay

## 2023-06-26 NOTE — Patient Outreach (Signed)
Care Coordination   Case Closure  Visit Note   06/26/2023 Name: Eric [name] MRN: [id number] DOB: 12/18/1940  Eric [name] is a 82 y.o. year old male who sees Plotnikov, Eric Quint, MD for primary care.   RNCM received message from [facility] Liaison patient discharged home with Hospice, and confirmed by [facility] nurse, patient active with Hospice. Case closed.   Goals Addressed             This Visit's Progress    COMPLETED: Care coordination-continue to improve post hospitalization       Interventions Today    Flowsheet Row Most Recent Value  General Interventions   General Interventions Discussed/Reviewed General Interventions Reviewed  [Patient active with Hospice-case closed]            SDOH assessments and interventions completed:  No  Care Coordination Interventions:  No, not indicated   Follow up plan: No further intervention required.   Encounter Outcome:  Patient Visit Completed   Eric Sheriff, RN, MSN, BSN, CCM Care Management Coordinator [phone]

## 2023-07-04 ENCOUNTER — Other Ambulatory Visit: Payer: Self-pay | Admitting: Internal Medicine

## 2023-07-21 ENCOUNTER — Ambulatory Visit: Payer: Medicare Other | Admitting: Internal Medicine

## 2024-01-27 ENCOUNTER — Other Ambulatory Visit (HOSPITAL_COMMUNITY): Payer: Self-pay
